# Patient Record
Sex: Male | Born: 1960 | Race: White | Hispanic: No | Marital: Married | Smoking: Former smoker
Health system: Southern US, Community
[De-identification: ages and names within clinical notes are randomized; demographics above are authoritative.]

## PROBLEM LIST (undated history)

## (undated) DIAGNOSIS — I10 Essential (primary) hypertension: Secondary | ICD-10-CM

## (undated) DIAGNOSIS — F329 Major depressive disorder, single episode, unspecified: Secondary | ICD-10-CM

## (undated) DIAGNOSIS — K2990 Gastroduodenitis, unspecified, without bleeding: Secondary | ICD-10-CM

## (undated) DIAGNOSIS — Z8719 Personal history of other diseases of the digestive system: Secondary | ICD-10-CM

## (undated) DIAGNOSIS — G4730 Sleep apnea, unspecified: Secondary | ICD-10-CM

## (undated) DIAGNOSIS — T7840XA Allergy, unspecified, initial encounter: Secondary | ICD-10-CM

## (undated) DIAGNOSIS — D869 Sarcoidosis, unspecified: Secondary | ICD-10-CM

## (undated) DIAGNOSIS — K219 Gastro-esophageal reflux disease without esophagitis: Secondary | ICD-10-CM

## (undated) DIAGNOSIS — K589 Irritable bowel syndrome without diarrhea: Secondary | ICD-10-CM

## (undated) DIAGNOSIS — F419 Anxiety disorder, unspecified: Secondary | ICD-10-CM

## (undated) DIAGNOSIS — F3289 Other specified depressive episodes: Secondary | ICD-10-CM

## (undated) DIAGNOSIS — K2970 Gastritis, unspecified, without bleeding: Secondary | ICD-10-CM

## (undated) DIAGNOSIS — B20 Human immunodeficiency virus [HIV] disease: Secondary | ICD-10-CM

## (undated) DIAGNOSIS — G5621 Lesion of ulnar nerve, right upper limb: Secondary | ICD-10-CM

## (undated) HISTORY — DX: Other specified depressive episodes: F32.89

## (undated) HISTORY — DX: Allergy, unspecified, initial encounter: T78.40XA

## (undated) HISTORY — DX: Gastroduodenitis, unspecified, without bleeding: K29.90

## (undated) HISTORY — DX: Human immunodeficiency virus (HIV) disease: B20

## (undated) HISTORY — DX: Irritable bowel syndrome, unspecified: K58.9

## (undated) HISTORY — PX: HERNIA REPAIR: SHX51

## (undated) HISTORY — DX: Sarcoidosis, unspecified: D86.9

## (undated) HISTORY — DX: Gastritis, unspecified, without bleeding: K29.70

## (undated) HISTORY — DX: Major depressive disorder, single episode, unspecified: F32.9

## (undated) HISTORY — DX: Anxiety disorder, unspecified: F41.9

## (undated) HISTORY — PX: TONSILLECTOMY AND ADENOIDECTOMY: SUR1326

---

## 1998-10-31 DIAGNOSIS — D869 Sarcoidosis, unspecified: Secondary | ICD-10-CM

## 1998-10-31 HISTORY — DX: Sarcoidosis, unspecified: D86.9

## 1998-10-31 HISTORY — PX: LUNG SURGERY: SHX703

## 1999-03-29 ENCOUNTER — Encounter: Payer: Self-pay | Admitting: Specialist

## 1999-03-31 ENCOUNTER — Ambulatory Visit (HOSPITAL_COMMUNITY): Admission: RE | Admit: 1999-03-31 | Discharge: 1999-03-31 | Payer: Self-pay | Admitting: Specialist

## 1999-03-31 ENCOUNTER — Encounter: Payer: Self-pay | Admitting: Specialist

## 1999-04-02 ENCOUNTER — Encounter: Payer: Self-pay | Admitting: Oncology

## 1999-04-02 ENCOUNTER — Ambulatory Visit (HOSPITAL_COMMUNITY): Admission: RE | Admit: 1999-04-02 | Discharge: 1999-04-02 | Payer: Self-pay | Admitting: Oncology

## 1999-04-07 ENCOUNTER — Ambulatory Visit (HOSPITAL_COMMUNITY): Admission: RE | Admit: 1999-04-07 | Discharge: 1999-04-07 | Payer: Self-pay | Admitting: Pulmonary Disease

## 1999-04-07 ENCOUNTER — Encounter: Payer: Self-pay | Admitting: Pulmonary Disease

## 1999-04-30 ENCOUNTER — Encounter: Payer: Self-pay | Admitting: Cardiothoracic Surgery

## 1999-05-03 ENCOUNTER — Inpatient Hospital Stay (HOSPITAL_COMMUNITY): Admission: RE | Admit: 1999-05-03 | Discharge: 1999-05-06 | Payer: Self-pay | Admitting: Cardiothoracic Surgery

## 1999-05-03 ENCOUNTER — Encounter: Payer: Self-pay | Admitting: Cardiothoracic Surgery

## 1999-05-04 ENCOUNTER — Encounter: Payer: Self-pay | Admitting: Cardiothoracic Surgery

## 1999-05-05 ENCOUNTER — Encounter: Payer: Self-pay | Admitting: Cardiothoracic Surgery

## 1999-07-07 ENCOUNTER — Ambulatory Visit (HOSPITAL_COMMUNITY): Admission: RE | Admit: 1999-07-07 | Discharge: 1999-07-07 | Payer: Self-pay | Admitting: Pulmonary Disease

## 2000-07-26 ENCOUNTER — Ambulatory Visit (HOSPITAL_COMMUNITY): Admission: RE | Admit: 2000-07-26 | Discharge: 2000-07-26 | Payer: Self-pay | Admitting: Pulmonary Disease

## 2000-09-08 ENCOUNTER — Ambulatory Visit (HOSPITAL_COMMUNITY): Admission: RE | Admit: 2000-09-08 | Discharge: 2000-09-08 | Payer: Self-pay | Admitting: Internal Medicine

## 2000-09-08 ENCOUNTER — Encounter: Payer: Self-pay | Admitting: Internal Medicine

## 2000-10-31 HISTORY — PX: ESOPHAGOGASTRODUODENOSCOPY: SHX1529

## 2001-03-20 ENCOUNTER — Encounter: Payer: Self-pay | Admitting: *Deleted

## 2001-03-20 ENCOUNTER — Inpatient Hospital Stay (HOSPITAL_COMMUNITY): Admission: EM | Admit: 2001-03-20 | Discharge: 2001-03-29 | Payer: Self-pay | Admitting: Emergency Medicine

## 2001-03-26 ENCOUNTER — Encounter: Payer: Self-pay | Admitting: Internal Medicine

## 2001-03-31 ENCOUNTER — Inpatient Hospital Stay (HOSPITAL_COMMUNITY): Admission: EM | Admit: 2001-03-31 | Discharge: 2001-04-04 | Payer: Self-pay | Admitting: Emergency Medicine

## 2001-06-06 ENCOUNTER — Ambulatory Visit (HOSPITAL_COMMUNITY): Admission: RE | Admit: 2001-06-06 | Discharge: 2001-06-06 | Payer: Self-pay | Admitting: Internal Medicine

## 2001-06-06 ENCOUNTER — Encounter: Admission: RE | Admit: 2001-06-06 | Discharge: 2001-06-06 | Payer: Self-pay | Admitting: Internal Medicine

## 2001-06-06 ENCOUNTER — Encounter (INDEPENDENT_AMBULATORY_CARE_PROVIDER_SITE_OTHER): Payer: Self-pay | Admitting: *Deleted

## 2001-06-06 LAB — CONVERTED CEMR LAB: CD4 Count: 340 microliters

## 2001-06-19 ENCOUNTER — Encounter: Admission: RE | Admit: 2001-06-19 | Discharge: 2001-06-19 | Payer: Self-pay | Admitting: Internal Medicine

## 2001-07-10 ENCOUNTER — Encounter: Admission: RE | Admit: 2001-07-10 | Discharge: 2001-07-10 | Payer: Self-pay | Admitting: Internal Medicine

## 2001-08-07 ENCOUNTER — Encounter: Admission: RE | Admit: 2001-08-07 | Discharge: 2001-08-07 | Payer: Self-pay | Admitting: Internal Medicine

## 2001-08-07 ENCOUNTER — Ambulatory Visit (HOSPITAL_COMMUNITY): Admission: RE | Admit: 2001-08-07 | Discharge: 2001-08-07 | Payer: Self-pay | Admitting: Internal Medicine

## 2001-08-21 ENCOUNTER — Encounter: Admission: RE | Admit: 2001-08-21 | Discharge: 2001-08-21 | Payer: Self-pay | Admitting: Internal Medicine

## 2001-10-29 ENCOUNTER — Encounter: Admission: RE | Admit: 2001-10-29 | Discharge: 2001-10-29 | Payer: Self-pay | Admitting: Internal Medicine

## 2001-10-29 ENCOUNTER — Ambulatory Visit: Admission: RE | Admit: 2001-10-29 | Discharge: 2001-10-29 | Payer: Self-pay | Admitting: Internal Medicine

## 2001-12-11 ENCOUNTER — Encounter: Admission: RE | Admit: 2001-12-11 | Discharge: 2001-12-11 | Payer: Self-pay | Admitting: Internal Medicine

## 2002-02-26 ENCOUNTER — Ambulatory Visit (HOSPITAL_COMMUNITY): Admission: RE | Admit: 2002-02-26 | Discharge: 2002-02-26 | Payer: Self-pay | Admitting: Internal Medicine

## 2002-02-26 ENCOUNTER — Encounter: Admission: RE | Admit: 2002-02-26 | Discharge: 2002-02-26 | Payer: Self-pay | Admitting: Internal Medicine

## 2002-03-12 ENCOUNTER — Encounter: Admission: RE | Admit: 2002-03-12 | Discharge: 2002-03-12 | Payer: Self-pay | Admitting: Internal Medicine

## 2002-06-04 ENCOUNTER — Ambulatory Visit (HOSPITAL_COMMUNITY): Admission: RE | Admit: 2002-06-04 | Discharge: 2002-06-04 | Payer: Self-pay | Admitting: Internal Medicine

## 2002-06-04 ENCOUNTER — Encounter: Admission: RE | Admit: 2002-06-04 | Discharge: 2002-06-04 | Payer: Self-pay | Admitting: Internal Medicine

## 2002-06-18 ENCOUNTER — Encounter: Admission: RE | Admit: 2002-06-18 | Discharge: 2002-06-18 | Payer: Self-pay | Admitting: Internal Medicine

## 2002-07-23 ENCOUNTER — Encounter: Admission: RE | Admit: 2002-07-23 | Discharge: 2002-07-23 | Payer: Self-pay | Admitting: Internal Medicine

## 2002-09-23 ENCOUNTER — Ambulatory Visit (HOSPITAL_COMMUNITY): Admission: RE | Admit: 2002-09-23 | Discharge: 2002-09-23 | Payer: Self-pay | Admitting: Internal Medicine

## 2002-09-23 ENCOUNTER — Encounter: Admission: RE | Admit: 2002-09-23 | Discharge: 2002-09-23 | Payer: Self-pay | Admitting: Internal Medicine

## 2002-11-19 ENCOUNTER — Encounter: Admission: RE | Admit: 2002-11-19 | Discharge: 2002-11-19 | Payer: Self-pay | Admitting: Internal Medicine

## 2002-12-10 ENCOUNTER — Encounter: Admission: RE | Admit: 2002-12-10 | Discharge: 2002-12-10 | Payer: Self-pay | Admitting: Internal Medicine

## 2002-12-24 ENCOUNTER — Encounter: Admission: RE | Admit: 2002-12-24 | Discharge: 2002-12-24 | Payer: Self-pay | Admitting: Internal Medicine

## 2003-03-11 ENCOUNTER — Encounter: Admission: RE | Admit: 2003-03-11 | Discharge: 2003-03-11 | Payer: Self-pay | Admitting: Internal Medicine

## 2003-03-11 ENCOUNTER — Encounter: Payer: Self-pay | Admitting: Internal Medicine

## 2003-03-25 ENCOUNTER — Encounter: Admission: RE | Admit: 2003-03-25 | Discharge: 2003-03-25 | Payer: Self-pay | Admitting: Internal Medicine

## 2003-06-10 ENCOUNTER — Ambulatory Visit (HOSPITAL_COMMUNITY): Admission: RE | Admit: 2003-06-10 | Discharge: 2003-06-10 | Payer: Self-pay | Admitting: Internal Medicine

## 2003-06-10 ENCOUNTER — Encounter: Admission: RE | Admit: 2003-06-10 | Discharge: 2003-06-10 | Payer: Self-pay | Admitting: Internal Medicine

## 2003-06-10 ENCOUNTER — Encounter: Payer: Self-pay | Admitting: Internal Medicine

## 2003-06-24 ENCOUNTER — Encounter: Admission: RE | Admit: 2003-06-24 | Discharge: 2003-06-24 | Payer: Self-pay | Admitting: Internal Medicine

## 2003-08-27 ENCOUNTER — Encounter: Payer: Self-pay | Admitting: Internal Medicine

## 2003-08-27 ENCOUNTER — Encounter: Admission: RE | Admit: 2003-08-27 | Discharge: 2003-08-27 | Payer: Self-pay | Admitting: Internal Medicine

## 2003-08-27 ENCOUNTER — Ambulatory Visit (HOSPITAL_COMMUNITY): Admission: RE | Admit: 2003-08-27 | Discharge: 2003-08-27 | Payer: Self-pay | Admitting: Internal Medicine

## 2003-09-11 ENCOUNTER — Encounter: Admission: RE | Admit: 2003-09-11 | Discharge: 2003-09-11 | Payer: Self-pay | Admitting: Internal Medicine

## 2003-12-15 ENCOUNTER — Ambulatory Visit: Admission: RE | Admit: 2003-12-15 | Discharge: 2003-12-15 | Payer: Self-pay | Admitting: Internal Medicine

## 2003-12-15 ENCOUNTER — Encounter: Admission: RE | Admit: 2003-12-15 | Discharge: 2003-12-15 | Payer: Self-pay | Admitting: Internal Medicine

## 2003-12-23 ENCOUNTER — Encounter: Admission: RE | Admit: 2003-12-23 | Discharge: 2003-12-23 | Payer: Self-pay | Admitting: Internal Medicine

## 2004-02-24 ENCOUNTER — Ambulatory Visit (HOSPITAL_COMMUNITY): Admission: RE | Admit: 2004-02-24 | Discharge: 2004-02-24 | Payer: Self-pay | Admitting: Internal Medicine

## 2004-02-24 ENCOUNTER — Encounter: Admission: RE | Admit: 2004-02-24 | Discharge: 2004-02-24 | Payer: Self-pay | Admitting: Internal Medicine

## 2004-03-16 ENCOUNTER — Encounter: Admission: RE | Admit: 2004-03-16 | Discharge: 2004-03-16 | Payer: Self-pay | Admitting: Internal Medicine

## 2004-04-12 ENCOUNTER — Encounter: Admission: RE | Admit: 2004-04-12 | Discharge: 2004-04-12 | Payer: Self-pay | Admitting: Family Medicine

## 2004-05-17 ENCOUNTER — Encounter: Admission: RE | Admit: 2004-05-17 | Discharge: 2004-05-17 | Payer: Self-pay | Admitting: Internal Medicine

## 2004-05-17 ENCOUNTER — Ambulatory Visit (HOSPITAL_COMMUNITY): Admission: RE | Admit: 2004-05-17 | Discharge: 2004-05-17 | Payer: Self-pay | Admitting: Internal Medicine

## 2004-06-08 ENCOUNTER — Encounter: Admission: RE | Admit: 2004-06-08 | Discharge: 2004-06-08 | Payer: Self-pay | Admitting: Internal Medicine

## 2004-10-01 ENCOUNTER — Ambulatory Visit (HOSPITAL_COMMUNITY): Admission: RE | Admit: 2004-10-01 | Discharge: 2004-10-01 | Payer: Self-pay | Admitting: Internal Medicine

## 2004-10-01 ENCOUNTER — Ambulatory Visit: Payer: Self-pay | Admitting: Internal Medicine

## 2004-10-12 ENCOUNTER — Ambulatory Visit: Payer: Self-pay | Admitting: Internal Medicine

## 2004-12-22 ENCOUNTER — Ambulatory Visit: Payer: Self-pay | Admitting: Internal Medicine

## 2004-12-22 ENCOUNTER — Encounter (INDEPENDENT_AMBULATORY_CARE_PROVIDER_SITE_OTHER): Payer: Self-pay | Admitting: *Deleted

## 2004-12-22 ENCOUNTER — Ambulatory Visit (HOSPITAL_COMMUNITY): Admission: RE | Admit: 2004-12-22 | Discharge: 2004-12-22 | Payer: Self-pay | Admitting: Internal Medicine

## 2004-12-22 LAB — CONVERTED CEMR LAB: CD4 Count: 260 microliters

## 2005-01-04 ENCOUNTER — Ambulatory Visit: Payer: Self-pay | Admitting: Internal Medicine

## 2006-01-24 ENCOUNTER — Encounter: Admission: RE | Admit: 2006-01-24 | Discharge: 2006-01-24 | Payer: Self-pay | Admitting: Internal Medicine

## 2006-01-24 ENCOUNTER — Ambulatory Visit: Payer: Self-pay | Admitting: Internal Medicine

## 2006-01-24 ENCOUNTER — Encounter (INDEPENDENT_AMBULATORY_CARE_PROVIDER_SITE_OTHER): Payer: Self-pay | Admitting: *Deleted

## 2006-02-07 ENCOUNTER — Ambulatory Visit: Payer: Self-pay | Admitting: Internal Medicine

## 2006-07-25 ENCOUNTER — Ambulatory Visit: Payer: Self-pay | Admitting: Internal Medicine

## 2006-07-25 ENCOUNTER — Encounter (INDEPENDENT_AMBULATORY_CARE_PROVIDER_SITE_OTHER): Payer: Self-pay | Admitting: *Deleted

## 2006-07-25 ENCOUNTER — Encounter: Admission: RE | Admit: 2006-07-25 | Discharge: 2006-07-25 | Payer: Self-pay | Admitting: Internal Medicine

## 2006-07-25 LAB — CONVERTED CEMR LAB
CD4 Count: 410 microliters
HIV 1 RNA Quant: 639 copies/mL

## 2006-08-08 ENCOUNTER — Ambulatory Visit: Payer: Self-pay | Admitting: Internal Medicine

## 2006-08-18 DIAGNOSIS — B20 Human immunodeficiency virus [HIV] disease: Secondary | ICD-10-CM

## 2006-08-18 DIAGNOSIS — D869 Sarcoidosis, unspecified: Secondary | ICD-10-CM | POA: Insufficient documentation

## 2006-12-21 ENCOUNTER — Encounter: Admission: RE | Admit: 2006-12-21 | Discharge: 2006-12-21 | Payer: Self-pay | Admitting: Internal Medicine

## 2006-12-21 ENCOUNTER — Ambulatory Visit: Payer: Self-pay | Admitting: Internal Medicine

## 2006-12-21 LAB — CONVERTED CEMR LAB
ALT: 25 units/L (ref 0–53)
AST: 19 units/L (ref 0–37)
Albumin: 4.8 g/dL (ref 3.5–5.2)
Alkaline Phosphatase: 83 units/L (ref 39–117)
BUN: 15 mg/dL (ref 6–23)
Basophils Absolute: 0 10*3/uL (ref 0.0–0.1)
Basophils Relative: 0 % (ref 0–1)
CD4 % Helper T Cell: 27 %
CO2: 27 meq/L (ref 19–32)
Calcium: 9.6 mg/dL (ref 8.4–10.5)
Chloride: 104 meq/L (ref 96–112)
Cholesterol: 123 mg/dL (ref 0–200)
Creatinine, Ser: 1.01 mg/dL (ref 0.40–1.50)
Eosinophils Absolute: 0.1 10*3/uL (ref 0.0–0.7)
Eosinophils Relative: 1 % (ref 0–5)
Glucose, Bld: 80 mg/dL (ref 70–99)
HCT: 49.4 % (ref 39.0–52.0)
HDL: 27 mg/dL — ABNORMAL LOW (ref >39)
HIV 1 RNA Quant: 497 copies/mL — ABNORMAL HIGH (ref <50)
HIV-1 RNA Quant, Log: 2.7 — ABNORMAL HIGH (ref <1.70)
Hemoglobin: 17.3 g/dL — ABNORMAL HIGH (ref 13.0–17.0)
LDL Cholesterol: 47 mg/dL (ref 0–99)
Lymphocytes Relative: 24 % (ref 12–46)
Lymphs Abs: 1.5 10*3/uL (ref 0.7–3.3)
MCHC: 35 g/dL (ref 30.0–36.0)
MCV: 92.7 fL (ref 78.0–100.0)
Monocytes Absolute: 0.5 10*3/uL (ref 0.2–0.7)
Monocytes Relative: 7 % (ref 3–11)
Neutro Abs: 4.2 10*3/uL (ref 1.7–7.7)
Neutrophils Relative %: 68 % (ref 43–77)
Platelets: 213 10*3/uL (ref 150–400)
Potassium: 4.9 meq/L (ref 3.5–5.3)
RBC: 5.33 M/uL (ref 4.22–5.81)
RDW: 13.6 % (ref 11.5–14.0)
Sodium: 138 meq/L (ref 135–145)
Total Bilirubin: 0.5 mg/dL (ref 0.3–1.2)
Total CHOL/HDL Ratio: 4.6
Total Protein: 7.7 g/dL (ref 6.0–8.3)
Triglycerides: 244 mg/dL — ABNORMAL HIGH (ref <150)
VLDL: 49 mg/dL — ABNORMAL HIGH (ref 0–40)
WBC: 6.3 10*3/uL (ref 4.0–10.5)

## 2006-12-25 ENCOUNTER — Encounter (INDEPENDENT_AMBULATORY_CARE_PROVIDER_SITE_OTHER): Payer: Self-pay | Admitting: *Deleted

## 2006-12-25 LAB — CONVERTED CEMR LAB

## 2007-01-03 DIAGNOSIS — F329 Major depressive disorder, single episode, unspecified: Secondary | ICD-10-CM

## 2007-01-03 DIAGNOSIS — F32A Depression, unspecified: Secondary | ICD-10-CM | POA: Insufficient documentation

## 2007-01-04 ENCOUNTER — Ambulatory Visit: Payer: Self-pay | Admitting: Internal Medicine

## 2007-01-07 ENCOUNTER — Encounter (INDEPENDENT_AMBULATORY_CARE_PROVIDER_SITE_OTHER): Payer: Self-pay | Admitting: *Deleted

## 2007-02-19 ENCOUNTER — Ambulatory Visit: Payer: Self-pay | Admitting: Internal Medicine

## 2007-02-19 LAB — CONVERTED CEMR LAB
Bilirubin Urine: NEGATIVE
Ketones, ur: NEGATIVE mg/dL
Protein, ur: NEGATIVE mg/dL
RBC / HPF: NONE SEEN (ref <3)
Urine Glucose: NEGATIVE mg/dL
WBC, UA: NONE SEEN cells/hpf (ref <3)

## 2007-03-16 ENCOUNTER — Encounter: Payer: Self-pay | Admitting: Internal Medicine

## 2007-03-16 ENCOUNTER — Ambulatory Visit: Payer: Self-pay | Admitting: Internal Medicine

## 2007-03-16 LAB — CONVERTED CEMR LAB
AST: 24 units/L (ref 0–37)
Bilirubin, Direct: 0.1 mg/dL (ref 0.0–0.3)
CO2: 28 meq/L (ref 19–32)
Chloride: 105 meq/L (ref 96–112)
Creatinine, Ser: 1 mg/dL (ref 0.4–1.5)
Eosinophils Relative: 1.6 % (ref 0.0–5.0)
Glucose, Bld: 100 mg/dL — ABNORMAL HIGH (ref 70–99)
HCT: 47.7 % (ref 39.0–52.0)
Neutrophils Relative %: 69.4 % (ref 43.0–77.0)
RBC: 5.12 M/uL (ref 4.22–5.81)
RDW: 12.5 % (ref 11.5–14.6)
Sodium: 139 meq/L (ref 135–145)
TSH: 0.69 microintl units/mL (ref 0.35–5.50)
Total Bilirubin: 0.7 mg/dL (ref 0.3–1.2)
Total Protein: 7.6 g/dL (ref 6.0–8.3)
WBC: 6.6 10*3/uL (ref 4.5–10.5)

## 2007-07-18 ENCOUNTER — Ambulatory Visit: Payer: Self-pay | Admitting: Internal Medicine

## 2007-07-18 ENCOUNTER — Encounter: Admission: RE | Admit: 2007-07-18 | Discharge: 2007-07-18 | Payer: Self-pay | Admitting: Internal Medicine

## 2007-08-07 ENCOUNTER — Ambulatory Visit: Payer: Self-pay | Admitting: Internal Medicine

## 2007-10-30 ENCOUNTER — Encounter: Payer: Self-pay | Admitting: Internal Medicine

## 2008-01-30 ENCOUNTER — Encounter: Admission: RE | Admit: 2008-01-30 | Discharge: 2008-01-30 | Payer: Self-pay | Admitting: Internal Medicine

## 2008-01-30 ENCOUNTER — Ambulatory Visit: Payer: Self-pay | Admitting: Internal Medicine

## 2008-01-30 ENCOUNTER — Encounter: Payer: Self-pay | Admitting: Internal Medicine

## 2008-01-30 LAB — CONVERTED CEMR LAB
AST: 19 units/L (ref 0–37)
Alkaline Phosphatase: 82 units/L (ref 39–117)
BUN: 16 mg/dL (ref 6–23)
Creatinine, Ser: 0.93 mg/dL (ref 0.40–1.50)
HCT: 47.2 % (ref 39.0–52.0)
HDL: 29 mg/dL — ABNORMAL LOW (ref >39)
Hemoglobin: 16.8 g/dL (ref 13.0–17.0)
LDL Cholesterol: 69 mg/dL (ref 0–99)
MCHC: 35.6 g/dL (ref 30.0–36.0)
MCV: 91.5 fL (ref 78.0–100.0)
RDW: 13.8 % (ref 11.5–15.5)
Total CHOL/HDL Ratio: 4.6
Triglycerides: 181 mg/dL — ABNORMAL HIGH (ref <150)

## 2008-02-12 ENCOUNTER — Ambulatory Visit: Payer: Self-pay | Admitting: Internal Medicine

## 2008-07-23 ENCOUNTER — Ambulatory Visit: Payer: Self-pay | Admitting: Internal Medicine

## 2008-07-23 LAB — CONVERTED CEMR LAB: HIV-1 RNA Quant, Log: 2.87 — ABNORMAL HIGH (ref <1.70)

## 2008-08-19 ENCOUNTER — Ambulatory Visit: Payer: Self-pay | Admitting: Internal Medicine

## 2008-08-19 LAB — CONVERTED CEMR LAB: HIV-1 RNA Quant, Log: 2.49 — ABNORMAL HIGH (ref <1.70)

## 2008-08-22 ENCOUNTER — Telehealth: Payer: Self-pay | Admitting: Internal Medicine

## 2008-10-31 HISTORY — PX: COLONOSCOPY: SHX5424

## 2009-01-07 ENCOUNTER — Encounter: Payer: Self-pay | Admitting: Internal Medicine

## 2009-02-09 ENCOUNTER — Ambulatory Visit: Payer: Self-pay | Admitting: Internal Medicine

## 2009-02-10 ENCOUNTER — Ambulatory Visit: Payer: Self-pay | Admitting: Internal Medicine

## 2009-02-10 LAB — CONVERTED CEMR LAB
ALT: 16 units/L (ref 0–53)
Albumin: 4.5 g/dL (ref 3.5–5.2)
Alkaline Phosphatase: 85 units/L (ref 39–117)
Basophils Absolute: 0 10*3/uL (ref 0.0–0.1)
Basophils Relative: 0 % (ref 0–1)
CO2: 26 meq/L (ref 19–32)
Eosinophils Absolute: 0.1 10*3/uL (ref 0.0–0.7)
Eosinophils Relative: 2 % (ref 0–5)
GFR calc non Af Amer: >60 mL/min (ref >60)
Glucose, Bld: 99 mg/dL (ref 70–99)
HCT: 46.1 % (ref 39.0–52.0)
HIV 1 RNA Quant: 989 copies/mL — ABNORMAL HIGH (ref <48)
Hemoglobin: 16.1 g/dL (ref 13.0–17.0)
Lymphocytes Relative: 25 % (ref 12–46)
MCHC: 34.9 g/dL (ref 30.0–36.0)
Monocytes Absolute: 0.5 10*3/uL (ref 0.1–1.0)
PSA: 0.52 ng/mL (ref 0.10–4.00)
Platelets: 200 10*3/uL (ref 150–400)
Potassium: 4.2 meq/L (ref 3.5–5.3)
RDW: 14.1 % (ref 11.5–15.5)
Sodium: 140 meq/L (ref 135–145)
TSH: 3.199 microintl units/mL (ref 0.350–4.500)
Total Protein: 7.2 g/dL (ref 6.0–8.3)
Triglycerides: 590 mg/dL — ABNORMAL HIGH (ref <150)

## 2009-02-16 ENCOUNTER — Telehealth (INDEPENDENT_AMBULATORY_CARE_PROVIDER_SITE_OTHER): Payer: Self-pay | Admitting: *Deleted

## 2009-02-24 ENCOUNTER — Ambulatory Visit: Payer: Self-pay | Admitting: Internal Medicine

## 2009-02-24 LAB — CONVERTED CEMR LAB: HIV 1 RNA Quant: 958 copies/mL — ABNORMAL HIGH (ref <48)

## 2009-03-05 ENCOUNTER — Telehealth: Payer: Self-pay | Admitting: Internal Medicine

## 2009-05-06 ENCOUNTER — Ambulatory Visit: Payer: Self-pay | Admitting: Internal Medicine

## 2009-05-06 LAB — CONVERTED CEMR LAB: HIV-1 RNA Quant, Log: 2.34 — ABNORMAL HIGH (ref <1.68)

## 2009-05-22 ENCOUNTER — Telehealth: Payer: Self-pay | Admitting: Internal Medicine

## 2009-05-26 ENCOUNTER — Ambulatory Visit: Payer: Self-pay | Admitting: Internal Medicine

## 2009-06-05 ENCOUNTER — Encounter: Payer: Self-pay | Admitting: Internal Medicine

## 2009-06-10 ENCOUNTER — Encounter: Payer: Self-pay | Admitting: Internal Medicine

## 2009-08-25 ENCOUNTER — Ambulatory Visit: Payer: Self-pay | Admitting: Internal Medicine

## 2009-08-25 LAB — CONVERTED CEMR LAB
HIV 1 RNA Quant: <48 copies/mL (ref <48)
HIV-1 RNA Quant, Log: <1.68 (ref <1.68)

## 2009-09-08 ENCOUNTER — Ambulatory Visit: Payer: Self-pay | Admitting: Internal Medicine

## 2009-11-23 ENCOUNTER — Ambulatory Visit: Payer: Self-pay | Admitting: Internal Medicine

## 2009-11-23 LAB — CONVERTED CEMR LAB
ALT: 15 units/L (ref 0–53)
Albumin: 5.1 g/dL (ref 3.5–5.2)
Alkaline Phosphatase: 85 units/L (ref 39–117)
Basophils Absolute: 0 10*3/uL (ref 0.0–0.1)
Basophils Relative: 0 % (ref 0–1)
CO2: 25 meq/L (ref 19–32)
HIV 1 RNA Quant: 314 copies/mL — ABNORMAL HIGH (ref <48)
HIV-1 RNA Quant, Log: 2.5 — ABNORMAL HIGH (ref <1.68)
Hemoglobin: 17.2 g/dL — ABNORMAL HIGH (ref 13.0–17.0)
LDL Cholesterol: 94 mg/dL (ref 0–99)
Lymphocytes Relative: 23 % (ref 12–46)
MCHC: 35 g/dL (ref 30.0–36.0)
Monocytes Absolute: 0.5 10*3/uL (ref 0.1–1.0)
Monocytes Relative: 7 % (ref 3–12)
Neutro Abs: 4.8 10*3/uL (ref 1.7–7.7)
Neutrophils Relative %: 70 % (ref 43–77)
Potassium: 4.6 meq/L (ref 3.5–5.3)
RBC: 5.32 M/uL (ref 4.22–5.81)
Sodium: 140 meq/L (ref 135–145)
Total Bilirubin: 0.5 mg/dL (ref 0.3–1.2)
Total Protein: 7.7 g/dL (ref 6.0–8.3)
VLDL: 31 mg/dL (ref 0–40)
WBC: 6.8 10*3/uL (ref 4.0–10.5)

## 2009-12-01 ENCOUNTER — Encounter: Payer: Self-pay | Admitting: Internal Medicine

## 2009-12-09 ENCOUNTER — Telehealth: Payer: Self-pay | Admitting: Internal Medicine

## 2010-02-05 ENCOUNTER — Telehealth: Payer: Self-pay | Admitting: Internal Medicine

## 2010-03-22 ENCOUNTER — Ambulatory Visit: Payer: Self-pay | Admitting: Internal Medicine

## 2010-03-30 ENCOUNTER — Ambulatory Visit: Payer: Self-pay | Admitting: Internal Medicine

## 2010-03-30 LAB — CONVERTED CEMR LAB: HIV-1 RNA Quant, Log: 2.18 — ABNORMAL HIGH (ref <1.68)

## 2010-04-13 ENCOUNTER — Ambulatory Visit: Payer: Self-pay | Admitting: Internal Medicine

## 2010-04-26 ENCOUNTER — Emergency Department (HOSPITAL_BASED_OUTPATIENT_CLINIC_OR_DEPARTMENT_OTHER): Admission: EM | Admit: 2010-04-26 | Discharge: 2010-04-27 | Payer: Self-pay | Admitting: Emergency Medicine

## 2010-04-30 ENCOUNTER — Telehealth: Payer: Self-pay | Admitting: Internal Medicine

## 2010-04-30 ENCOUNTER — Encounter: Payer: Self-pay | Admitting: Internal Medicine

## 2010-05-03 ENCOUNTER — Ambulatory Visit: Payer: Self-pay | Admitting: Family Medicine

## 2010-05-10 ENCOUNTER — Telehealth: Payer: Self-pay | Admitting: Family Medicine

## 2010-05-10 ENCOUNTER — Telehealth: Payer: Self-pay | Admitting: Internal Medicine

## 2010-08-18 ENCOUNTER — Emergency Department (HOSPITAL_COMMUNITY): Admission: EM | Admit: 2010-08-18 | Discharge: 2010-08-18 | Payer: Self-pay | Admitting: Emergency Medicine

## 2010-08-26 ENCOUNTER — Encounter: Payer: Self-pay | Admitting: Internal Medicine

## 2010-08-26 ENCOUNTER — Ambulatory Visit: Payer: Self-pay | Admitting: Internal Medicine

## 2010-08-26 DIAGNOSIS — R079 Chest pain, unspecified: Secondary | ICD-10-CM

## 2010-09-20 ENCOUNTER — Ambulatory Visit: Payer: Self-pay | Admitting: Internal Medicine

## 2010-09-20 LAB — CONVERTED CEMR LAB: HIV 1 RNA Quant: 156 copies/mL — ABNORMAL HIGH (ref <20)

## 2010-10-14 ENCOUNTER — Ambulatory Visit: Payer: Self-pay | Admitting: Internal Medicine

## 2010-12-02 NOTE — Assessment & Plan Note (Signed)
Summary: labs    Current Allergies: ! MORPHINE ! DOXYCYCLINE  Other Orders: T-CD4SP ([facility]) (CD4SP) T-HIV Viral Load [phone]) T-PSA [phone])  Process Orders Check Orders Results:     [facility]: ABN not required for this insurance Tests Sent for requisitioning (April 05, 2010 4:43 PM):     03/30/2010: [facility] -- T-HIV Viral Load [phone] (signed)     03/30/2010: [facility] -- T-PSA [phone] (signed)

## 2010-12-02 NOTE — Progress Notes (Signed)
Summary: ED visit  Phone Note Call from Patient   Caller: Patient Call For: Cliffton Asters MD Reason for Call: Acute Illness Details for Reason: ED visit for shingles Summary of Call: Pt. called to notify Dr. Orvan Falconer that he was seen in the ED 04/23/10 for outbreak of shingles and given RX for Acyclovir 800 mg. one tablet four times a day.  He is not having any pain, but wanted Dr. Orvan Falconer to be aware.  Will call clinic if he needs to be seen. Initial call taken by: Wendall Mola CMA ( AAMA),  April 30, 2010 11:00 AM

## 2010-12-02 NOTE — Progress Notes (Signed)
Summary: Re: results   Phone Note Call from Patient   Summary of Call: Pt says he was told to call prior to his visit for CD-4 and VL and if they were ok he would not need a visit with Dr Orvan Falconer. Please advise.   Pt will be available on 12-10-09 for return call. Initial call taken by: Tomasita Morrow RN,  December 09, 2009 2:05 PM  Follow-up for Phone Call        Tyreese's CD4 count is stable at 410 and his VL is 314. He can f/u after labs in 3 months. Follow-up by: Cliffton Asters MD,  December 10, 2009 9:54 AM

## 2010-12-02 NOTE — Miscellaneous (Signed)
  Clinical Lists Changes  Medications: Added new medication of ACYCLOVIR 800 MG TABS (ACYCLOVIR) take one tablet by mouth every four hours

## 2010-12-02 NOTE — Assessment & Plan Note (Signed)
Summary: HAS SHINGLES,NOT GETTING BETTER/TJ   Vital Signs:  Patient Profile:   50 Years Old Male CC:      Shingles- worsening rash and pain, rm 3 Height:     68 inches (172.72 cm) Weight:      174 pounds O2 Sat:      97 % O2 treatment:    Room Air Temp:     97.4 degrees F oral Pulse rate:   71 / minute Resp:     14 per minute BP sitting:   137 / 83  (right arm) Cuff size:   regular  Pt. in pain?   yes    Location:   left side and back    Type:       aching  Vitals Entered By: Lajean Saver RN (May 03, 2010 12:35 PM)                   Updated Prior Medication List: ATRIPLA 600-200-300 MG TABS (EFAVIRENZ-EMTRICITAB-TENOFOVIR) Take 1 tablet by mouth once a day CITALOPRAM HYDROBROMIDE 20 MG  TABS (CITALOPRAM HYDROBROMIDE) Take 1 tablet by mouth once a day ACYCLOVIR 800 MG TABS (ACYCLOVIR) take one tablet by mouth every four hours  Current Allergies (reviewed today): ! MORPHINE ! DOXYCYCLINEHistory of Present Illness Chief Complaint: Shingles- worsening rash and pain, rm 3 History of Present Illness:    Subjective:  Patient complains of onset of left chest shingles one week ago and was started on Acyclovir at that time.  He has had no improvement in pain and the rash has persisted.  REVIEW OF SYSTEMS Constitutional Symptoms       Complains of fatigue.     Denies fever, chills, night sweats, weight loss, and weight gain.  Eyes       Denies change in vision, eye pain, eye discharge, glasses, contact lenses, and eye surgery. Ear/Nose/Throat/Mouth       Denies hearing loss/aids, change in hearing, ear pain, ear discharge, dizziness, frequent runny nose, frequent nose bleeds, sinus problems, sore throat, hoarseness, and tooth pain or bleeding.  Respiratory       Denies dry cough, productive cough, wheezing, shortness of breath, asthma, bronchitis, and emphysema/COPD.  Cardiovascular       Denies murmurs, chest pain, and tires easily with exhertion.    Gastrointestinal     Denies stomach pain, nausea/vomiting, diarrhea, constipation, blood in bowel movements, and indigestion. Genitourniary       Denies painful urination, kidney stones, and loss of urinary control. Neurological       Denies paralysis, seizures, and fainting/blackouts. Musculoskeletal       Denies muscle pain, joint pain, joint stiffness, decreased range of motion, redness, swelling, muscle weakness, and gout.  Skin       Denies bruising, unusual mles/lumps or sores, and hair/skin or nail changes.      Comments: left abdomen spread to back Psych       Denies mood changes, temper/anger issues, anxiety/stress, speech problems, depression, and sleep problems. Other Comments: patient diagnosed with shingle 1 week ago, worsening pain and rash increasing in size   Past History:  Past Medical History: Reviewed history from 02/09/2009 and no changes required. HIV disease (Dr Orvan Falconer) Depression sarcoidosis   Past Surgical History: Tonsillectomy VATS July 2000 ---> Dx  Sarcoidosis (used to see Dr Shelle Iron )  Family History: Reviewed history from 02/09/2009 and no changes required. DM--no HTN--no colon ca--no Prostate ca--no MI--no  Social History: Reviewed history from 03/22/2010 and no changes  required. 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  No adenopathy is present.   Lungs:  Clear to auscultation.  Breath sounds are equal.  Heart:  Regular rate and rhythm without murmurs, rubs, or gallops.  Abdomen:  Nontender without masses or hepatosplenomegaly.  Bowel sounds are present.  No CVA or flank tenderness.  Skin: erythematous raised herpetic eruption on left chest dermatome distribution.  No vesicles.  No tenderness to palpationl Assessment New Problems: HERPES ZOSTER (ICD-053.9)  NOT IMPROVING ON ACYCLOVIR  Plan New  Medications/Changes: VALTREX 1 GM TABS (VALACYCLOVIR HCL) One by mouth q8hr for shingles  #21 x 0, 05/03/2010, Donna Christen MD  New Orders: New Patient Level III [phone] Planning Comments:   Discontinue acyclovir and begin Valtrex.   Follow-up with PCP if not improving.   The patient and/or caregiver has been counseled thoroughly with regard to medications prescribed including dosage, schedule, interactions, rationale for use, and possible side effects and they verbalize understanding.  Diagnoses and expected course of recovery discussed and will return if not improved as expected or if the condition worsens. Patient and/or caregiver verbalized understanding.  Prescriptions: VALTREX 1 GM TABS (VALACYCLOVIR HCL) One by mouth q8hr for shingles  #21 x 0   Entered and Authorized by:   Donna Christen MD   Signed by:   Donna Christen MD on 05/03/2010   Method used:   Print then Give to Patient   RxID:   [id number]   Orders Added: 1)  New Patient Level III [95621]

## 2010-12-02 NOTE — Assessment & Plan Note (Signed)
Summary: 6 MONTH F/U [[facility]]   CC:  follow-up visit.  History of Present Illness: Bruce Woods is in for his routine visit.  He has not missed a single dose of his Atripla since his last visit.  Preventive Screening-Counseling & Management  Alcohol-Tobacco     Alcohol drinks/day: 0     Alcohol type: cocktail, 2/wk     Smoking Status: quit     Year Quit: 1985     Pack years: 2     Passive Smoke Exposure: no  Caffeine-Diet-Exercise     Caffeine use/day: no     Does Patient Exercise: yes     Type of exercise: work out a gym     Exercise (avg: min/session): 60     Times/week: 5  Hep-HIV-STD-Contraception     HIV Risk: no risk noted     HIV Risk Counseling: not indicated-no HIV risk noted  Safety-Violence-Falls     Seat Belt Use: yes  Comments: declined condoms      Sexual History:  currently monogamous.        Drug Use:  never.     Updated Prior Medication List: ATRIPLA 600-200-300 MG TABS (EFAVIRENZ-EMTRICITAB-TENOFOVIR) Take 1 tablet by mouth once a day CITALOPRAM HYDROBROMIDE 20 MG  TABS (CITALOPRAM HYDROBROMIDE) Take 1 tablet by mouth once a day  Current Allergies (reviewed today): ! MORPHINE ! DOXYCYCLINE Vital Signs:  Patient profile:   50 year old male Height:      68 inches (172.72 cm) Weight:      179 pounds (81.36 kg) BMI:     27.32 Temp:     97.6 degrees F (36.44 degrees C) oral Pulse rate:   77 / minute BP sitting:   140 / 90  (left arm) Cuff size:   large  Vitals Entered By: Jennet Maduro RN (October 14, 2010 9:26 AM) CC: follow-up visit Is Patient Diabetic? No Pain Assessment Patient in pain? no      Nutritional Status BMI of 25 - 29 = overweight Nutritional Status Detail appetite "good"  Have you ever been in a relationship where you felt threatened, hurt or afraid?No   Does patient need assistance? Functional Status Self care Ambulation Normal Comments no missed doses   Physical Exam  General:  alert and well-nourished.   Mouth:   pharynx pink and moist.  good dentition.   Lungs:  normal breath sounds, no crackles, and no wheezes.   Heart:  normal rate, regular rhythm, and no murmur.   Psych:  normally interactive, good eye contact, not anxious appearing, and not depressed appearing.          Medication Adherence: 10/14/2010   Adherence to medications reviewed with patient. Counseling to provide adequate adherence provided                                Impression & Recommendations:  Problem # 1:  HIV DISEASE (ICD-042) His viral load is barely detectable but low in stable.  I will continue his current regimen. Diagnostics Reviewed:  HIV: HIV positive - not AIDS (04/13/2010)   CD4: 390 (09/21/2010)   CD4 %: 27 (12/21/2006) WBC: 6.8 (11/23/2009)   Hgb: 17.2 (11/23/2009)   HCT: 49.1 (11/23/2009)   Platelets: 226 (11/23/2009) HIV genotype: See Comment (02/24/2009)   HIV-1 RNA: 156 (09/20/2010)   HBSAg: No (12/25/2006)  Other Orders: Est. Patient Level III (60454) Future Orders: T-CD4SP ([facility]) (CD4SP) ... 04/12/2011 T-HIV Viral  Load [phone]) ... 04/12/2011 T-Comprehensive Metabolic Panel [phone]) ... 04/12/2011 T-CBC w/Diff ([id number]) ... 04/12/2011 T-RPR (Syphilis) [phone]) ... 04/12/2011 T-Lipid Profile [phone]) ... 04/12/2011  Patient Instructions: 1)  Please schedule a follow-up appointment in 6 months.   Prevention & Chronic Care Immunizations   Influenza vaccine: Historical  (08/23/2010)    Tetanus booster: 03/16/2007: Tdap    Pneumococcal vaccine: Pneumovax  (08/19/2008)  Colorectal Screening   Hemoccult: Not documented    Colonoscopy: normal  (06/05/2009)   Colonoscopy due: 06/2019  Other Screening   PSA: 0.48  (03/30/2010)   Smoking status: quit  (10/14/2010)  Lipids   Total Cholesterol: 158  (11/23/2009)   LDL: 94  (11/23/2009)   LDL Direct: Not documented   HDL: 33  (11/23/2009)   Triglycerides: 156  (11/23/2009)    Influenza Immunization  History:    Influenza # 1:  Historical (08/23/2010)

## 2010-12-02 NOTE — Assessment & Plan Note (Signed)
Summary: cpx//lch   Vital Signs:  Patient profile:   50 year old male Height:      68 inches Weight:      172.8 pounds BMI:     26.37 Pulse rate:   70 / minute BP sitting:   150 / 80  Vitals Entered By: Shary Decamp (Mar 22, 2010 3:06 PM) CC: cpx   History of Present Illness: CPX feels well   Preventive Screening-Counseling & Management  Alcohol-Tobacco     Alcohol type: cocktail, 2/wk      Drug Use:  no.    Current Medications (verified): 1)  Atripla 600-200-300 Mg Tabs (Efavirenz-Emtricitab-Tenofovir) .... Take 1 Tablet By Mouth Once A Day 2)  Citalopram Hydrobromide 20 Mg  Tabs (Citalopram Hydrobromide) .... Take 1 Tablet By Mouth Once A Day  Allergies (verified): 1)  ! Morphine 2)  ! Doxycycline  Past History:  Past Medical History: Reviewed history from 02/09/2009 and no changes required. HIV disease (Dr Orvan Falconer) Depression sarcoidosis   Past Surgical History: Reviewed history from 02/09/2009 and no changes required. Tonsillectomy VATS July 2000 ---> Dx  Sarcoidosis (used to see Dr Shelle Iron )  Family History: Reviewed history from 02/09/2009 and no changes required. 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 CV:  Denies chest pain or discomfort and swelling of feet. Resp:  few days ago had episode of sinus congestion cough and wheezing, old symptoms are now gone. GI:  Denies bloody stools, nausea, and vomiting. GU:  Denies hematuria, urinary frequency, and urinary hesitancy. Psych:  Denies anxiety and depression; symptoms well controlled w / citaloram .  Physical Exam  General:  alert, well-developed, and well-nourished.   Neck:  no thyromegaly.   Lungs:  normal respiratory effort, no intercostal  retractions, no accessory muscle use, and normal breath sounds.   Heart:  normal rate, regular rhythm, no murmur, and no gallop.   Abdomen:  soft, non-tender, no distention, no masses, no guarding, no hepatomegaly, and no splenomegaly.   Rectal:   one small external hemorrhoid   noted. Normal sphincter tone. No rectal masses or tenderness. Prostate:  Prostate gland firm and smooth, no enlargement, nodularity, tenderness, mass, asymmetry or induration. Extremities:  no lower extremity edema Psych:  Cognition and judgment appear intact. Alert and cooperative with normal attention span and concentration. No apparent delusions, illusions, hallucinationsnot anxious appearing and not depressed appearing.     Impression & Recommendations:  Problem # 1:  HEALTH SCREENING (ICD-V70.0) Td 2008 Pneumonia shot 2009 Colonoscopy -05-2009, next in 10  years cont healthy lifestyle HIV per Dr Orvan Falconer  he gets his labs routinely checked with Dr. Orvan Falconer, from my standpoint I only need to order a PSA. See instructions  Problem # 2:  DEPRESSION (ICD-311) well-controlled with citalopram His updated medication list for this problem includes:    Citalopram Hydrobromide 20 Mg Tabs (Citalopram hydrobromide) .Marland Kitchen... Take 1 tablet by mouth once a day  Complete Medication List: 1)  Atripla 600-200-300 Mg Tabs (Efavirenz-emtricitab-tenofovir) .... Take 1 tablet by mouth once a day 2)  Citalopram Hydrobromide 20 Mg Tabs (Citalopram hydrobromide) .... Take 1 tablet by mouth once a day  Patient Instructions: 1)  please ask Dr. Orvan Falconer to order a PSA, DX V. 70 and  send results to me 2)  Please schedule a follow-up appointment in 1 year.    Preventive Care Screening  Prior Values:    PPD:  Negative (12/25/2006)    PSA:  0.52 (02/10/2009)    Colonoscopy:  normal (06/05/2009)    Last Tetanus Booster:  Tdap (03/16/2007)    Risk Factors:  Tobacco use:  quit    Year quit:  1985    Pack-years:  2 Passive  smoke exposure:  no Drug use:  no HIV high-risk behavior:  no risk noted Caffeine use:  no drinks per day Alcohol use:  yes    Type:  cocktail, 2/wk    Drinks per day:  0 Exercise:  yes    Times per week:  5    Type:  work out a Arts development officer use:  yes %  Colonoscopy History:    Date of Last Colonoscopy:  06/05/2009

## 2010-12-02 NOTE — Assessment & Plan Note (Signed)
Summary: 6 mos f/u   CC:  f/u.  History of Present Illness: Bruce Woods is in for his routine visit.  He has not missed a single dose of his Atripla since his last visit. He does not feel comfortable stopping his antidepressant because he thinks that he will probably become very irritable again.  His youngest daughter just graduated from high school.  She is planning on going to nursing school.  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  Preventive Screening-Counseling & Management  Alcohol-Tobacco     Alcohol drinks/day: 0     Alcohol type: cocktail, 2/wk     Smoking Status: quit     Year Quit: 1985     Pack years: 2     Passive Smoke Exposure: no  Caffeine-Diet-Exercise     Caffeine use/day: no     Does Patient Exercise: yes     Type of exercise: work out a gym     Exercise (avg: min/session): 60     Times/week: 5  Hep-HIV-STD-Contraception     HIV Risk: no risk noted  Safety-Violence-Falls     Seat Belt Use: yes      Sexual History:  currently monogamous.        Drug Use:  never.     Prior Medication List:  ATRIPLA 600-200-300 MG TABS (EFAVIRENZ-EMTRICITAB-TENOFOVIR) Take 1 tablet by mouth once a day CITALOPRAM HYDROBROMIDE 20 MG  TABS (CITALOPRAM HYDROBROMIDE) Take 1 tablet by mouth once a day   Current Allergies (reviewed today): ! MORPHINE ! DOXYCYCLINE Social History: Drug Use:  never  Vital Signs:  Patient profile:   50 year old male Height:      68 inches (172.72 cm) Weight:      174 pounds (79.09 kg) BMI:     26.55 Temp:     97.4 degrees F oral Pulse rate:   64 / minute BP sitting:   148 / 98  (left arm) Cuff size:   regular  Vitals Entered By: Jennet Maduro RN (April 13, 2010 9:36 AM) CC: f/u Is Patient Diabetic? No Pain Assessment Patient in pain? no      Nutritional Status BMI of 19 -24 = normal Nutritional Status Detail appetite "  Have you ever been in a relationship where you felt  threatened, hurt or afraid?No   Does patient need assistance? Functional Status Self care Ambulation Normal Comments no missed doses   Physical Exam  General:  alert and well-nourished.  facial seborrhea is slightly worse today. Mouth:  pharynx pink and moist.  good dentition.   Lungs:  normal breath sounds, no crackles, and no wheezes.   Heart:  normal rate, regular rhythm, and no murmur.          Medication Adherence: 04/13/2010   Adherence to medications reviewed with patient. Counseling to provide adequate adherence provided   Prevention For Positives: 04/13/2010   Safe sex practices discussed with patient. Condoms offered.                             Impression & Recommendations:  Problem # 1:  HIV DISEASE (ICD-042) Miley's viral load has been lower this year following a blip last April. I will not make any changes today. Diagnostics Reviewed:  CD4: 460 (03/31/2010)   CD4 %: 27 (12/21/2006) WBC: 6.8 (11/23/2009)   Hgb: 17.2 (11/23/2009)   HCT: 49.1 (11/23/2009)   Platelets: 226 (  11/23/2009) HIV genotype: See Comment (02/24/2009)   HIV-1 RNA: 153 (03/30/2010)   HBSAg: No (12/25/2006)  Other Orders: Est. Patient Level III (46962) Future Orders: T-CD4SP ([facility]) (CD4SP) ... 10/10/2010 T-HIV Viral Load [phone]) ... 10/10/2010  Patient Instructions: 1)  Please schedule a follow-up appointment in 6 months.

## 2010-12-02 NOTE — Assessment & Plan Note (Signed)
Summary: WENT TO ER 10/19--CHEST PAIN--ENZYMES NEG--NEEDS FOLLOWUP///SPH   Vital Signs:  Patient profile:   50 year old male Weight:      170.38 pounds Pulse rate:   64 / minute Pulse rhythm:   regular BP sitting:   126 / 86  (left arm) Cuff size:   regular  Vitals Entered By: Army Fossa CMA (August 26, 2010 10:29 AM) CC: Pt here to f/u from ER was seen at [facility] for chest pain.  Comments Doing fine now [employer]    History of Present Illness: followup from the ER visit 08-18-10 , records reviewed: cardiac enzymes negative x2 CBC Normal BMP showed a potassium of 3.4, creatinine 1.0  chest x-ray without acute changes   ROS Since he left the hospital, he is asymptomatic. He describe what happened a few days ago as follows: Sudden onset of left substernal pressure type of pain without radiation. No associated nausea, vomiting, diaphoresis or shortness of breath There were no GERD symptoms, no cough Symptoms lasted about 30 minutes and went away shortly after he got nitroglycerin in the emergency room.  in general he is active, he walks quite a bit at work and runs some times without chest pain. Denies any pain or swelling in his calves   Current Medications (verified): 1)  Atripla 600-200-300 Mg Tabs (Efavirenz-Emtricitab-Tenofovir) .... Take 1 Tablet By Mouth Once A Day 2)  Citalopram Hydrobromide 20 Mg  Tabs (Citalopram Hydrobromide) .... Take 1 Tablet By Mouth Once A Day  Allergies (verified): 1)  ! Morphine 2)  ! Doxycycline  Past History:  Past Medical History: Reviewed history from 02/09/2009 and no changes required. HIV disease (Dr Orvan Falconer) Depression sarcoidosis   Past Surgical History: Reviewed history from 05/03/2010 and no changes required. Tonsillectomy VATS July 2000 ---> Dx  Sarcoidosis (used to see Dr Shelle Iron )  Family History: Reviewed history from 02/09/2009 and no changes required. DM--no HTN--no colon ca--no Prostate  ca--no MI--no  Social History: Reviewed history from 03/22/2010 and no changes required. 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   Chest Wall:  not tender to palpation in the anterior chest Lungs:  normal respiratory effort, no intercostal retractions, no accessory muscle use, and normal breath sounds.   Heart:  normal rate, regular rhythm, no murmur, and no gallop.   Abdomen:  soft, non-tender, no distention, no masses, no guarding, and no rigidity.   Extremities:  no edema   Impression & Recommendations:  Problem # 1:  CHEST PAIN (ICD-22.50) 50 year old gentleman with no diabetes, hypertension or family history of heart disease. He had a 30 minute episode of left-sided chest pain, symptoms went await quickly  after nitroglycerin. EKG 08-18-10 showed normal sinus rhythm EKG today no different from previous Given his age and the fact that the pain went away after nitroglycerin, I recommended a stress test. Start aspirin 81 mg daily ER if symptoms resurface  Orders: EKG w/ Interpretation (93000) Cardiology Referral (Cardiology)  Complete Medication List: 1)  Atripla 600-200-300 Mg Tabs (Efavirenz-emtricitab-tenofovir) .... Take 1 tablet by mouth once a day 2)  Citalopram Hydrobromide 20 Mg Tabs (Citalopram hydrobromide) .... Take 1 tablet by mouth once a day 3)  Aspirin 81 Mg Tbec (Aspirin) .... One daily  Patient Instructions: 1)  start aspirin 2)  call me  if you have more chest pain or  go to the ER if symptoms severe   Orders Added: 1)  EKG w/ Interpretation [93000] 2)  Cardiology Referral [Cardiology] 3)  Est. Patient Level IV [38756]

## 2010-12-02 NOTE — Medication Information (Signed)
Summary: [facility]: Antiretroviral Therapy  [facility]: Antiretroviral Therapy   Imported By: Florinda Marker 12/01/2009 14:33:56  _____________________________________________________________________  External Attachment:    Type:   Image     Comment:   External Document

## 2010-12-02 NOTE — Progress Notes (Signed)
Summary: Does pt. need to be seen 03/01/2010  Phone Note Call from Patient Call back at [phone]  cell   Caller: Patient Call For: Cliffton Asters MD Reason for Call: Talk to Nurse Summary of Call: Pt. came to clinic for labs, but these were unnecessary due to having previously being drawn on 12/29/2009.  Pt. would like to know whether he needs to come in on 03/02/10 or can he wait for another 3 months based on his previous labs.  Please advise. Jennet Maduro RN  February 05, 2010 11:32 AM   Follow-up for Phone Call        I do not see any labs from 3/1. Follow-up by: Cliffton Asters MD,  February 08, 2010 11:42 AM

## 2010-12-02 NOTE — Progress Notes (Signed)
  Phone Note From Other Clinic   Summary of Call: after hours call from nurse line apparently pt called office about getting px for lyrica for day time post herpetic neuralgia pain and never got a call back- so are calling nurseline at 730 pm   Follow-up for Phone Call        I went ahead and called in lyrica 75 mg two times a day -- warned of sedation  told him to f/u with Dr Drue Novel as well Follow-up by: Judith Part MD,  May 10, 2010 7:27 PM    New/Updated Medications: LYRICA 75 MG CAPS (PREGABALIN) 1 by mouth two times a day Prescriptions: LYRICA 75 MG CAPS (PREGABALIN) 1 by mouth two times a day  #60 x 0   Entered and Authorized by:   Judith Part MD   Signed by:   Judith Part MD on 05/10/2010   Method used:   Historical   RxID:   [id number]

## 2010-12-02 NOTE — Progress Notes (Signed)
Summary: [facility]  Phone Note Call from Patient Call back at Work Phone [phone]   Summary of Call: Patient called [place] and wanted to know if he could get a rx for Lyrica for the pain and discomfort from the shingles dx. He said he is "eating" vicoden and robaxin at night for the pain and he is still trying to work during the day, not taking the vicoden then. If you could please call this rx in.   Pharmacy: [facility] on [address].  Initial call taken by: Harold Barban,  May 10, 2010 11:46 AM  Follow-up for Phone Call        see next phone note Tonda Wiederhold E. Nicola Quesnell MD  May 11, 2010 9:58 AM and

## 2011-01-11 LAB — T-HELPER CELL (CD4) - (RCID CLINIC ONLY)
CD4 % Helper T Cell: 22 % — ABNORMAL LOW (ref 33–55)
CD4 T Cell Abs: 390 uL — ABNORMAL LOW (ref 400–2700)

## 2011-01-12 LAB — POCT CARDIAC MARKERS
CKMB, poc: <1 ng/mL — ABNORMAL LOW (ref 1.0–8.0)
CKMB, poc: <1 ng/mL — ABNORMAL LOW (ref 1.0–8.0)
Myoglobin, poc: 61.6 ng/mL (ref 12–200)

## 2011-01-12 LAB — DIFFERENTIAL
Basophils Absolute: 0 10*3/uL (ref 0.0–0.1)
Basophils Relative: 0 % (ref 0–1)
Lymphocytes Relative: 27 % (ref 12–46)
Monocytes Absolute: 0.6 10*3/uL (ref 0.1–1.0)
Neutro Abs: 6.7 10*3/uL (ref 1.7–7.7)
Neutrophils Relative %: 66 % (ref 43–77)

## 2011-01-12 LAB — CBC
Platelets: 224 10*3/uL (ref 150–400)
RDW: 13.3 % (ref 11.5–15.5)
WBC: 10.1 10*3/uL (ref 4.0–10.5)

## 2011-01-12 LAB — BASIC METABOLIC PANEL
BUN: 14 mg/dL (ref 6–23)
Creatinine, Ser: 1.06 mg/dL (ref 0.4–1.5)
GFR calc non Af Amer: >60 mL/min (ref >60)
Potassium: 3.4 mEq/L — ABNORMAL LOW (ref 3.5–5.1)

## 2011-01-16 LAB — T-HELPER CELL (CD4) - (RCID CLINIC ONLY): CD4 % Helper T Cell: 28 % — ABNORMAL LOW (ref 33–55)

## 2011-01-17 LAB — T-HELPER CELL (CD4) - (RCID CLINIC ONLY)
CD4 % Helper T Cell: 26 % — ABNORMAL LOW (ref 33–55)
CD4 T Cell Abs: 460 uL (ref 400–2700)

## 2011-01-27 ENCOUNTER — Encounter: Payer: Self-pay | Admitting: Internal Medicine

## 2011-01-27 ENCOUNTER — Other Ambulatory Visit: Payer: Self-pay | Admitting: *Deleted

## 2011-01-27 DIAGNOSIS — F329 Major depressive disorder, single episode, unspecified: Secondary | ICD-10-CM

## 2011-01-27 MED ORDER — CITALOPRAM HYDROBROMIDE 20 MG PO TABS: 20.0000 mg | ORAL_TABLET | Freq: Every day | ORAL | Status: DC

## 2011-02-03 LAB — T-HELPER CELL (CD4) - (RCID CLINIC ONLY): CD4 % Helper T Cell: 27 % — ABNORMAL LOW (ref 33–55)

## 2011-02-09 LAB — T-HELPER CELL (CD4) - (RCID CLINIC ONLY): CD4 % Helper T Cell: 29 % — ABNORMAL LOW (ref 33–55)

## 2011-03-18 NOTE — H&P (Signed)
[place]. [facility]  Patient:    Bruce Woods, Bruce Woods                     MRN: [id number] Adm. Date:  [id number] Attending:  Rogelia Boga                         History and Physical  CHIEF COMPLAINT:  Weakness, nausea and vomiting.  HISTORY OF PRESENT ILLNESS:  The patient is a 50 year old white gentleman who was admitted to this facility from May 21 through May 30, for an acute febrile illness associated with severe myalgias, fever, leukopenia, thrombocytopenia, and hepatitis.  During this hospital admission he had an extensive evaluation by infectious disease with a negative workup.  PAST MEDICAL HISTORY:  Remarkable for a history of sarcoid involving lung, liver, and spleen.  The patient was discharged on Doxycycline 100 mg b.i.d. two days ago.  Since his discharge he has remained quite weak and anorexic, although, he denies any frank fever or chills.  His p.o. intake has been quite marginal.  On the day of admission he became weaker with intractable nausea and vomiting.  The patient is now admitted for further evaluation and treatment of his acute febrile illness.  ER evaluation included electrolytes that were unremarkable except for potassium level of 3.0.  Transaminases were improved with an SGOT of 81 and SGPT of 159.  Alkaline phosphatase was normal.  The patients albumin was surprisingly normal at 4.1 inspite of a 22 pound weight loss since his acute illness.  The patients leukopenia and thrombocytopenia also were improved.  PAST MEDICAL HISTORY:  Otherwise fairly noncontributory.  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  The patient has not required any treatment for his sarcoid.  He has had wisdom teeth extractions in the past.  Otherwise past medical history is negative.  ALLERGIES:   MORPHINE SULFATE.  During his prior hospital admission the patient had extensive evaluation which included blood cultures that were negative.  The patients white count was 3.2 on admission, but fell as low as 1.9.  Sedimentation rate was normal at 12. Initial LFTs were minimally elevated on admission, but later peaked to the 300 range.  Antibiotic therapy included doxycycline only, but did receive  some Rocephin prior to his hospital admission.  The patient was also evaluated by hematology.  Serology for CMV, EBV, HIV, RMSF as well as hepatitis serology and Urlichia serology were all negative.  HIV viral load by PCR, however, is pending.  A bone marrow biopsy was discussed during his prior hospital admission, but was elected to observe the patients clinical response before proceeding.  FAMILY HISTORY: SOCIAL HISTORY:  Please see prior chart for details.  The patient is a recovery room RN.  His wife is also an Charity fundraiser.  They have two children.  PHYSICAL EXAMINATION:  VITAL SIGNS:  Blood pressure 130/80, pulse 62, temperature 99 degrees, respiratory rate normal.  GENERAL:  A well-developed, thin, white male who was quite uncomfortable due to severe myalgias involving both lower extremities.  He was alert and oriented.  SKIN:  Revealed some chronic flakiness and dryness involving his scalp, but no new dermatitis.  HEENT: NECK:  Conjunctivae clear.  No petechiae noted.  Tympanic membranes and canals normal.  Oropharynx was perhaps slightly  erythematous.  There was no exudate noted.  Neck was supple without meningismus.  There was no adenopathy.  CHEST:  Essentially clear.  HEART:  Slow regular rhythm.  There is no murmur appreciated.  ABDOMEN:  Soft and nontender.  There was no organomegaly noted.  The spleen could not be palpated inspite of his documented splenomegaly.  GENITOURINARY:  External genitalia normal.  LYMPHATICS:  Negative.  EXTREMITIES:  Full peripheral pulses with  no edema.  Palpation of his symptomatic muscles involving his lower extremities reveal no real spasm or tenderness.  NEUROLOGICAL:  Negative.  IMPRESSION:  Acute febrile illness manifested by myalgias, anorexia, nausea, leukopenia, thrombocytopenia, and hepatitis.  DISPOSITION:  The patient will be admitted to the hospital.  He will be supported with IV fluids.  Repeat blood cultures and sed rate will be obtained.  An ACE level will also be obtained.  He will be supported and treated symptomatically.  The patient will be seen in consultation by hematology and ID. DD:  03/31/01 TD:  04/01/01 Job: [id number]

## 2011-03-18 NOTE — Consult Note (Signed)
[place]. [facility]  Patient:    Bruce Woods, Bruce Woods                     MRN: [id number] Proc. Date: 03/24/01 Adm. Date:  [id number] Attending:  Dorena Cookey CC:         Claretta Fraise, M.D.  Dr. 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  He was eventually admitted May 21 for workup.  He was seen by Genene Churn. Granfortuna, M.D., back in the year 2000 secondary to an MRI showing a questionable marrow process. On workup, he was not found to have myeloma, and it was felt that his imaging was showing benign hemangiomas.  He was noted to have some findings then on chest imaging that led to a video-assisted thoracotomy July 2000 by Gwenith Daily. Tyrone Sage, M.D., that was positive for sarcoid.  Up until now, this has not required treatment.  Since being here, his workup has included the following: He has had serial CBCs.  From May 22, his white count was 1.9; May 23 it was 1.9; on May 24, 1.8; May 25, today, 1.5.  His neutrophil count on May 22 was 1000; on May 23 it was 1000; on May 24 it was 1100, but not checked today. Platelet count was 104,000 on May 22; 96,000 on May 23; 85,000 on May 24; and 74,000 today.  Hemoglobin was 13.1 on May 22; 13.7 on May 23; 13.7 on May 24; and 14 today.  MCV is 83, which is normal.  His differential from May 24 shows 60% segmented neutrophils, 31% lymphocytes, 8% monocytes.  Erythrocyte sedimentation rate is 12.  Blood cultures are no growth x 2 times three days. From May 25, sodium 134, potassium 3.6, creatinine 1.0, calcium 7.8. Serologies for CMV and EBV virus infections have been unrevealing.  No radiologic studies thus far.  He continues to have occasional low-grade temperatures, 100-100.6  degree range.  MEDICATIONS:  Home medications:  Vioxx.  Medications now include Ambien, Darvocet, and Zofran.  ALLERGIES:  MORPHINE.  FAMILY HISTORY:  Positive for prostate cancer in the paternal grandfather. Negative for hematologic disorders.  SOCIAL HISTORY:  Nonsmoker.  Occasional alcohol use.  Married with children.  REVIEW OF SYSTEMS:  As above.  Diarrhea x 1 day, which resolved.  Tolerating some p.o. liquids.  History of irritable bowel syndrome.  PHYSICAL EXAMINATION:  VITAL SIGNS:  Temperature 99.4 degrees, pulse 82, respirations 20, blood pressure 113/74.  O2 saturation 93%.  LYMPHATIC:  There was no cervical, supraclavicular, axillary, or inguinal adenopathy.  SKIN:  I do not notice any rashes.  ABDOMEN:  There was no hepatosplenomegaly.  ASSESSMENT:  Borderline neutropenia, moderate thrombocytopenia.  However, he does have a normal in this young male with a history of untreated sarcoid. His infectious disease workup is negative thus far.  I doubt this is a primary hematologic malignancy such as leukemia or lymphoma, without anemia and given the rapidity of onset.  I discussed the bone marrow biopsy and aspiration with him, which he would like to defer if possible.  I do not think this is a drug reaction.  This would still fit best with secondary bone marrow and hematologic process, perhaps from an infection.  PLAN:  Will review his blood smear and check his CBC q.d.   Will check a CMET and LDH tomorrow.  Will hold on Nupogen and bone marrow exam for now. DD:  03/25/01 TD:  03/26/01 Job: [id number]

## 2011-03-18 NOTE — Discharge Summary (Signed)
. [facility]  Patient:    Bruce Woods, Bruce Woods                     MRN: [id number] Adm. Date:  [id number] Disc. Date: 03/29/01 Attending:  Dorena Woods Dictator:   Bruce Woods, P.A. CC:         Bruce Woods, M.D. [facility]  Bruce Woods, M.D.  Bruce Woods, M.D.   Discharge Summary  DISCHARGE DIAGNOSES: 1. Myalgias. 2. Fever. 3. Leukopenia. 4. Thrombocytopenia. 5. Hepatitis.  HISTORY OF PRESENT ILLNESS:  Bruce Woods is a 50 year old white male who developed an acute illness over 48 hours prior to this admission with fever, fatigue, and myalgias.  PAST MEDICAL HISTORY: 1. Sarcoid lung, liver, and spleen. 2. Benign hemangiomas of the spine by MRI in 2000. 3. Status post video-assisted thoracotomy July 2000, that was positive for    sarcoid.  The patient has not required treatment. 4. History of wisdom teeth extraction.  HOSPITAL COURSE: #1 - INFECTIOUS DISEASE:  The patient presented with what appeared to be a viral-like syndrome with dehydration and myalgias.  The patient was aggressively hydrated.  The patient was not treated with antibiotics since there was no evidence of a bacterial infection.  In fact, the patients white count was 3.2 on admission.  The patients fever did began to resolve, but the patients white count fell to 1.9.  At the same time, we also noted a drop in his platelet count.  Blood cultures remained negative.  Urinalysis was negative.  Sedimentation rate was normal at 12.  Chest x-ray was without any infiltrates.  A rapid strep was also negative.  The patient had some mildly elevated LFTs with an SGOT of 45 and an SGPT of 24, which was normal.  The patient seemed to respond somewhat after arrival to the hospital.  Of note, the patient had received 1 g of Rocephin on Mar 19, 2001, in the office, and then received 2 g of Rocephin on Mar 20, 2001, in the emergency department, but that was the extent of the  antibiotics he received.  Because of the patients persistent leukopenia, we asked infectious disease to see the patient.  The patient was evaluated by Dr. Orvan Woods.  He did suspect a nonacute infectious illness with the differential including CMV, EBV, HIV, early spotless RMSF, and ______.  He recommended empiric treatment with doxycycline.  The patient was not able to initially tolerate oral doxycycline secondary to nausea and vomiting; therefore, it was changed to IV.  However, on administering it IV he had significant burning pain, and there was a low thought initially for a rickettsial process, and doxycycline was discontinued. EBV serology revealed a remote inactive infection, and CMV IgM was negative. The patient then developed hepatitis with transaminases in the 300s.  This was also associated with pharyngitis.  Per Dr. Elsie Woods, this clinically looked like mononucleosis, but the most common cause of Epstein-Barr virus and CMV had been ruled out.  He became concerned again about an ______ , given the elevated liver enzymes and recommended restarting the doxycycline.  We also used Magic Mouthwash for the patients mouth and throat pain.  We also obtained HIV, which was nonreactive.  Hepatitis serologies were also unremarkable.  Because of the patients persistent leukopenia and thrombocytopenia, we did ask for hematology to see the patient to rule out any type of hematologic process.  The patient was evaluated by Dr. Catha Woods.  He doubted this  was a hematologic malignancy such as leukemia or lymphoma without anemia and given the rapid onset.  He did discuss bone marrow biopsy and aspiration with the patient, which the patient preferred to hold off on at this time.  Dr. Catha Woods doubted this was a drug reaction.  He did review the patients blood smear, which did reveal some toxic granuloma but no blasts. There were also some giant platelets, which would go along with a reactive process.  The RBC  morphology was normal.  He favored watching the patient without proceeding with bone marrow exam at this time.  The patients peripheral smear was evaluated again with both the hematologist and the pathologist to rule out any rickettsial organisms, of which there were none found.  There had been some improvement in the patients thrombocytopenia but no significant change in his leukopenia.  Symptomatically, the patient has improved and is anxious to be discharged home.  Again, at this time, hematology is not planning on a bone marrow biopsy or the need for Neupogen at this time.  Dr. Orvan Woods agrees there is nothing that needs to be done in the hospital that could not be done at home, but he would like to follow up with the patient as an outpatient.  DISCHARGE LABORATORY DATA:  Acute hepatitis serologies were negative. Hepatitis B surface antigen negative.  Anti-HCV negative.  Anti-HAV IgM negative.  Hepatitis B core IgM negative.  SGOT was 218, SGPT was 203.  White count was 1.6, hemoglobin 13.3, platelet count was 78.  Sedimentation rate was 12.  HIV was nonreactive.  HIV viral load is pending.  Cortisol was 11.7. Blood cultures remain negative x 2.  Rapid strep was negative.  CMV antibody IgM was 0.18, which is negative.  Epstein-Barr virus capsid IgG was 9.12.  EBV capsid IgM was 0.43, and EBV antibody to nuclear antigen was 4.5.  EBV antibody to ______ D antigen was 2.04.  DISCHARGE MEDICATIONS: 1. Doxycycline 100 mg b.i.d. to complete a 10-day course. 2. Magic Mouthwash 5 mL swish and swallow before meals and bedtime for five    days.  FOLLOW-UP:  Follow up with Dr. Ruthine Woods in the next week for a CBC and CMET to monitor the patients transaminases.  To follow up with Dr. Orvan Woods as instructed. DD:  03/29/01 TD:  03/29/01 Job: [id number]

## 2011-03-18 NOTE — Discharge Summary (Signed)
[place]. [facility]  Patient:    Bruce, Woods                       MRN: [id number] Adm. Date:  03/31/01 Disc. Date: 04/04/01 Dictator:   Cornell Barman, P.A. CC:         Dr. Mena Goes, M.D. [facility]   Discharge Summary  DISCHARGE DIAGNOSES: 1. Nausea and vomiting. 2. Febrile illness. 3. Pancytopenia. 4. Sarcoidosis.  HISTORY OF PRESENT ILLNESS:  The patient is a 50 year old white male who had been hospitalized Mar 20, 2001 through Mar 29, 2001 for an acute febrile illness associated with myalgias, fever, leukopenia, thrombocytopenia, and hepatitis.  The patient had a complete infectious disease evaluation and an essentially negative workup.  PAST MEDICAL HISTORY: 1. History of sarcoid involving the lung, liver, and spleen. 2. Benign hemangiomas of the spine documented by MRI 2000. 3. Status post video-assisted thoracotomy in July 2000 confirming sarcoid lung    disease.  HOSPITAL COURSE:  GI:  The patient presented with nausea, vomiting, and anorexia.  This improved with IV fluids, antiemetics, and withdrawal of doxycycline.  Etiology of his gastritis was thought to be secondary to medication, doxycycline.  Infectious disease did see the patient and did not feel that the doxycycline needed to be continued.  The patients symptoms did improve.  The patient did have an endoscopy on April 03, 2001, and this was a normal exam.  DISCHARGE DIAGNOSTIC STUDIES:  Leptospira antibodies were not detected. Angiotensin converting enzyme was 40.  ANA was negative.  Lipase 67. Prealbumin 25.7.  SGPT was 75.  White count was 3.7.  Platelet count was 167. Hemoglobin 15.7.  DISCHARGE MEDICATIONS:  The patient was instructed to use Celebrex as needed.  FOLLOW-UP APPOINTMENT:  The patient was to see Dr. Ruthine Dose on Thursday at 4:30 p.m.  The patient was also to follow up for an outpatient bone marrow biopsy and aspirate that Dr. Ruthine Dose was to arrange. DD:   04/25/01 TD:  04/25/01 Job: [id number]

## 2011-04-05 ENCOUNTER — Other Ambulatory Visit (INDEPENDENT_AMBULATORY_CARE_PROVIDER_SITE_OTHER): Payer: 59

## 2011-04-05 DIAGNOSIS — Z113 Encounter for screening for infections with a predominantly sexual mode of transmission: Secondary | ICD-10-CM

## 2011-04-05 DIAGNOSIS — B20 Human immunodeficiency virus [HIV] disease: Secondary | ICD-10-CM

## 2011-04-05 DIAGNOSIS — Z79899 Other long term (current) drug therapy: Secondary | ICD-10-CM

## 2011-04-05 LAB — CBC WITH DIFFERENTIAL/PLATELET
Basophils Absolute: 0 10*3/uL (ref 0.0–0.1)
Basophils Relative: 0 % (ref 0–1)
Eosinophils Absolute: 0.1 10*3/uL (ref 0.0–0.7)
Eosinophils Relative: 1 % (ref 0–5)
HCT: 47 % (ref 39.0–52.0)
MCHC: 34.9 g/dL (ref 30.0–36.0)
MCV: 92.7 fL (ref 78.0–100.0)
Monocytes Absolute: 0.3 10*3/uL (ref 0.1–1.0)
RDW: 13.6 % (ref 11.5–15.5)

## 2011-04-05 LAB — LIPID PANEL
LDL Cholesterol: 91 mg/dL (ref 0–99)
VLDL: 27 mg/dL (ref 0–40)

## 2011-04-06 LAB — COMPLETE METABOLIC PANEL WITH GFR
AST: 17 U/L (ref 0–37)
Albumin: 4.9 g/dL (ref 3.5–5.2)
Alkaline Phosphatase: 75 U/L (ref 39–117)
Chloride: 104 mEq/L (ref 96–112)
GFR, Est Non African American: >60 mL/min (ref >60)
Glucose, Bld: 94 mg/dL (ref 70–99)
Potassium: 4.5 mEq/L (ref 3.5–5.3)
Sodium: 139 mEq/L (ref 135–145)
Total Protein: 7.1 g/dL (ref 6.0–8.3)

## 2011-04-06 LAB — T-HELPER CELL (CD4) - (RCID CLINIC ONLY)
CD4 % Helper T Cell: 25 % — ABNORMAL LOW (ref 33–55)
CD4 T Cell Abs: 400 uL (ref 400–2700)

## 2011-04-19 ENCOUNTER — Encounter: Payer: Self-pay | Admitting: Internal Medicine

## 2011-04-19 ENCOUNTER — Ambulatory Visit (INDEPENDENT_AMBULATORY_CARE_PROVIDER_SITE_OTHER): Payer: 59 | Admitting: Internal Medicine

## 2011-04-19 VITALS — BP 152/84 | HR 71 | Temp 97.9°F | Ht 68.0 in | Wt 176.0 lb

## 2011-04-19 DIAGNOSIS — B20 Human immunodeficiency virus [HIV] disease: Secondary | ICD-10-CM

## 2011-04-19 DIAGNOSIS — L259 Unspecified contact dermatitis, unspecified cause: Secondary | ICD-10-CM

## 2011-04-19 DIAGNOSIS — F329 Major depressive disorder, single episode, unspecified: Secondary | ICD-10-CM

## 2011-04-19 DIAGNOSIS — L309 Dermatitis, unspecified: Secondary | ICD-10-CM | POA: Insufficient documentation

## 2011-04-19 NOTE — Assessment & Plan Note (Signed)
His HIV infection remains under excellent control. I will continue Atripla.

## 2011-04-19 NOTE — Assessment & Plan Note (Signed)
His depression is well controlled with Celexa. He does not feel that he is ready to try tapering off of it, particularly since he now has the added stress of going to school part-time. He is exercising regularly and feels that this helps with his mood.

## 2011-04-19 NOTE — Assessment & Plan Note (Signed)
I do not believe that this is recurrent shingles census on the opposite side from the definite shingles he had last year. I suspect he has some form of contact dermatitis. He has been doing yard work and may have poison ivy. I will have him stop the acyclovir. The rash is very mild and improving so I do not think he needs any further therapy.

## 2011-04-19 NOTE — Progress Notes (Signed)
  Subjective:    Patient ID: Bruce Woods, male    DOB: [birth year], 50 y.o.   MRN: [id number]  HPI Bruce Woods is in for his routine visit. He has not missed any doses of his Atripla since his last visit. 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 He is enjoying that so far. 2 days ago he developed some itching and burning around his right flank and noticed some spots. He was worried that he might have recurrent shingles and restarted acyclovir.    Review of Systems     Objective:   Physical Exam  Constitutional: No distress.  HENT:  Mouth/Throat: Oropharynx is clear and moist. No oropharyngeal exudate.  Cardiovascular: Normal rate, regular rhythm and normal heart sounds.   No murmur heard. Pulmonary/Chest: Breath sounds normal. He has no wheezes. He has no rales.  Skin:       PS3 red papules at the waistline on the right side.  Psychiatric: He has a normal mood and affect.          Assessment & Plan:

## 2011-07-26 LAB — T-HELPER CELL (CD4) - (RCID CLINIC ONLY): CD4 T Cell Abs: 320 — ABNORMAL LOW

## 2011-08-11 LAB — T-HELPER CELL (CD4) - (RCID CLINIC ONLY): CD4 T Cell Abs: 490

## 2011-08-19 ENCOUNTER — Ambulatory Visit (INDEPENDENT_AMBULATORY_CARE_PROVIDER_SITE_OTHER): Payer: 59 | Admitting: Internal Medicine

## 2011-08-19 ENCOUNTER — Encounter: Payer: Self-pay | Admitting: Internal Medicine

## 2011-08-19 VITALS — BP 110/72 | HR 62 | Temp 98.5°F | Resp 14 | Wt 176.2 lb

## 2011-08-19 DIAGNOSIS — F3289 Other specified depressive episodes: Secondary | ICD-10-CM

## 2011-08-19 DIAGNOSIS — N529 Male erectile dysfunction, unspecified: Secondary | ICD-10-CM | POA: Insufficient documentation

## 2011-08-19 DIAGNOSIS — R6882 Decreased libido: Secondary | ICD-10-CM

## 2011-08-19 DIAGNOSIS — R5383 Other fatigue: Secondary | ICD-10-CM | POA: Insufficient documentation

## 2011-08-19 DIAGNOSIS — R5381 Other malaise: Secondary | ICD-10-CM

## 2011-08-19 DIAGNOSIS — F329 Major depressive disorder, single episode, unspecified: Secondary | ICD-10-CM

## 2011-08-19 MED ORDER — ALPRAZOLAM 0.5 MG PO TABS: 0.5000 mg | ORAL_TABLET | Freq: Every evening | ORAL | Status: AC | PRN

## 2011-08-19 NOTE — Assessment & Plan Note (Addendum)
Severe fatigue , decreased libido, difficulty w/ orgasms, some ED. No evidence of OSA on clinical grounds Labs 6-12 ok On SSRIs x years w/o sexual side effects Plan: Labs, see instructions Treat insomnia Treat ED if so desire (declined for now) If all labs normal: ?stress

## 2011-08-19 NOTE — Progress Notes (Signed)
  Subjective:    Patient ID: Bruce Woods, male    DOB: May 14, 1961, 50 y.o.   MRN: [id number]  HPI Chief complaint today is lack of energy, he is feeling exhausted for the last 2 months. Also for the last 6 months he has noted decrease in sex drive and some ED. He admits that he is extremely busy, besides work he is going to school full time. He is having some difficulty sleeping, he is unable to "decompres" at night. He tried some melatonin with moderate success, he tried Ambien but he did not like it at all. Wonders about low testosterone.  Past Medical History  Diagnosis Date  . DEPRESSION   . HIV DISEASE   . Sarcoidosis 2000    question of    Review of Systems Denies chest pain or shortness of breath No Lower extremity edema. Very rarely has palpitations, certainly not in the last 6 months. Denies snoring or feeling sleepy though out the day  No dysuria or gross hematuria, no difficulty urinating. Self testicular exam normal. Also has developed some difficulty with orgasms. Admits to stress, very busy schedule    Objective:   Physical Exam  Constitutional: He is oriented to person, place, and time. He appears well-developed and well-nourished. No distress.  Cardiovascular: Normal rate, regular rhythm and normal heart sounds.   No murmur heard. Pulmonary/Chest: Effort normal and breath sounds normal. No respiratory distress. He has no wheezes. He has no rales.  Abdominal: Soft. Bowel sounds are normal. He exhibits no distension. There is no tenderness. There is no rebound and no guarding.  Musculoskeletal: He exhibits no edema.  Neurological: He is alert and oriented to person, place, and time.  Skin: He is not diaphoretic.  Psychiatric: He has a normal mood and affect. His behavior is normal. Judgment and thought content normal.          Assessment & Plan:

## 2011-08-19 NOTE — Patient Instructions (Signed)
Please came back fasting: We need to get a rainbow in case we need further testing: Free and total  testosterone ----dx fatigue, decrease libido CBC- TSH- Vit D- B12 --- dx fatigue

## 2011-08-20 ENCOUNTER — Encounter: Payer: Self-pay | Admitting: Internal Medicine

## 2011-08-20 NOTE — Assessment & Plan Note (Signed)
See "fatigue" 

## 2011-08-20 NOTE — Assessment & Plan Note (Signed)
Well controlled 

## 2011-08-22 ENCOUNTER — Other Ambulatory Visit (INDEPENDENT_AMBULATORY_CARE_PROVIDER_SITE_OTHER): Payer: 59

## 2011-08-22 DIAGNOSIS — R5383 Other fatigue: Secondary | ICD-10-CM

## 2011-08-22 LAB — CBC WITH DIFFERENTIAL/PLATELET
Basophils Relative: 0.3 % (ref 0.0–3.0)
Eosinophils Absolute: 0.1 10*3/uL (ref 0.0–0.7)
Hemoglobin: 16.4 g/dL (ref 13.0–17.0)
Lymphocytes Relative: 29.6 % (ref 12.0–46.0)
MCHC: 35.1 g/dL (ref 30.0–36.0)
MCV: 96.9 fl (ref 78.0–100.0)
Neutro Abs: 4 10*3/uL (ref 1.4–7.7)
RBC: 4.81 Mil/uL (ref 4.22–5.81)

## 2011-08-22 NOTE — Progress Notes (Signed)
Labs only

## 2011-08-23 ENCOUNTER — Other Ambulatory Visit: Payer: Self-pay | Admitting: Dermatology

## 2011-08-23 LAB — VITAMIN D 25 HYDROXY (VIT D DEFICIENCY, FRACTURES): Vit D, 25-Hydroxy: 19 ng/mL — ABNORMAL LOW (ref 30–89)

## 2011-08-30 ENCOUNTER — Ambulatory Visit: Payer: 59 | Admitting: Internal Medicine

## 2011-08-31 ENCOUNTER — Telehealth: Payer: Self-pay

## 2011-08-31 ENCOUNTER — Other Ambulatory Visit: Payer: Self-pay

## 2011-08-31 MED ORDER — VITAMIN D (ERGOCALCIFEROL) 1.25 MG (50000 UNIT) PO CAPS: 50000.0000 [IU] | ORAL_CAPSULE | ORAL | Status: DC

## 2011-08-31 NOTE — Progress Notes (Signed)
Addended by: Beverely Low on: 08/31/2011 11:18 AM   Modules accepted: Orders

## 2011-08-31 NOTE — Telephone Encounter (Signed)
RX left at the front desk with a copy of the lab results attached for the patients wife to pick up 09/01/11

## 2011-09-01 ENCOUNTER — Other Ambulatory Visit: Payer: Self-pay | Admitting: Internal Medicine

## 2011-09-01 DIAGNOSIS — Z0000 Encounter for general adult medical examination without abnormal findings: Secondary | ICD-10-CM

## 2011-09-02 ENCOUNTER — Other Ambulatory Visit (INDEPENDENT_AMBULATORY_CARE_PROVIDER_SITE_OTHER): Payer: 59

## 2011-09-02 DIAGNOSIS — Z0000 Encounter for general adult medical examination without abnormal findings: Secondary | ICD-10-CM

## 2011-09-02 NOTE — Progress Notes (Signed)
Labs only

## 2011-09-05 LAB — TESTOSTERONE, FREE, TOTAL, SHBG
Sex Hormone Binding: 28 nmol/L (ref 13–71)
Testosterone, Free: 96.5 pg/mL (ref 47.0–244.0)
Testosterone-% Free: 2.3 % (ref 1.6–2.9)

## 2011-09-07 ENCOUNTER — Telehealth: Payer: Self-pay

## 2011-09-12 IMAGING — CR DG CHEST 2V
2 series · 2 of 2 positions shown · non-contrast
Comparison: None.

CLINICAL DATA: Chest pain.  History of sarcoidosis.  History of
AIDS.

CHEST - 2 VIEW

[w chest pa]
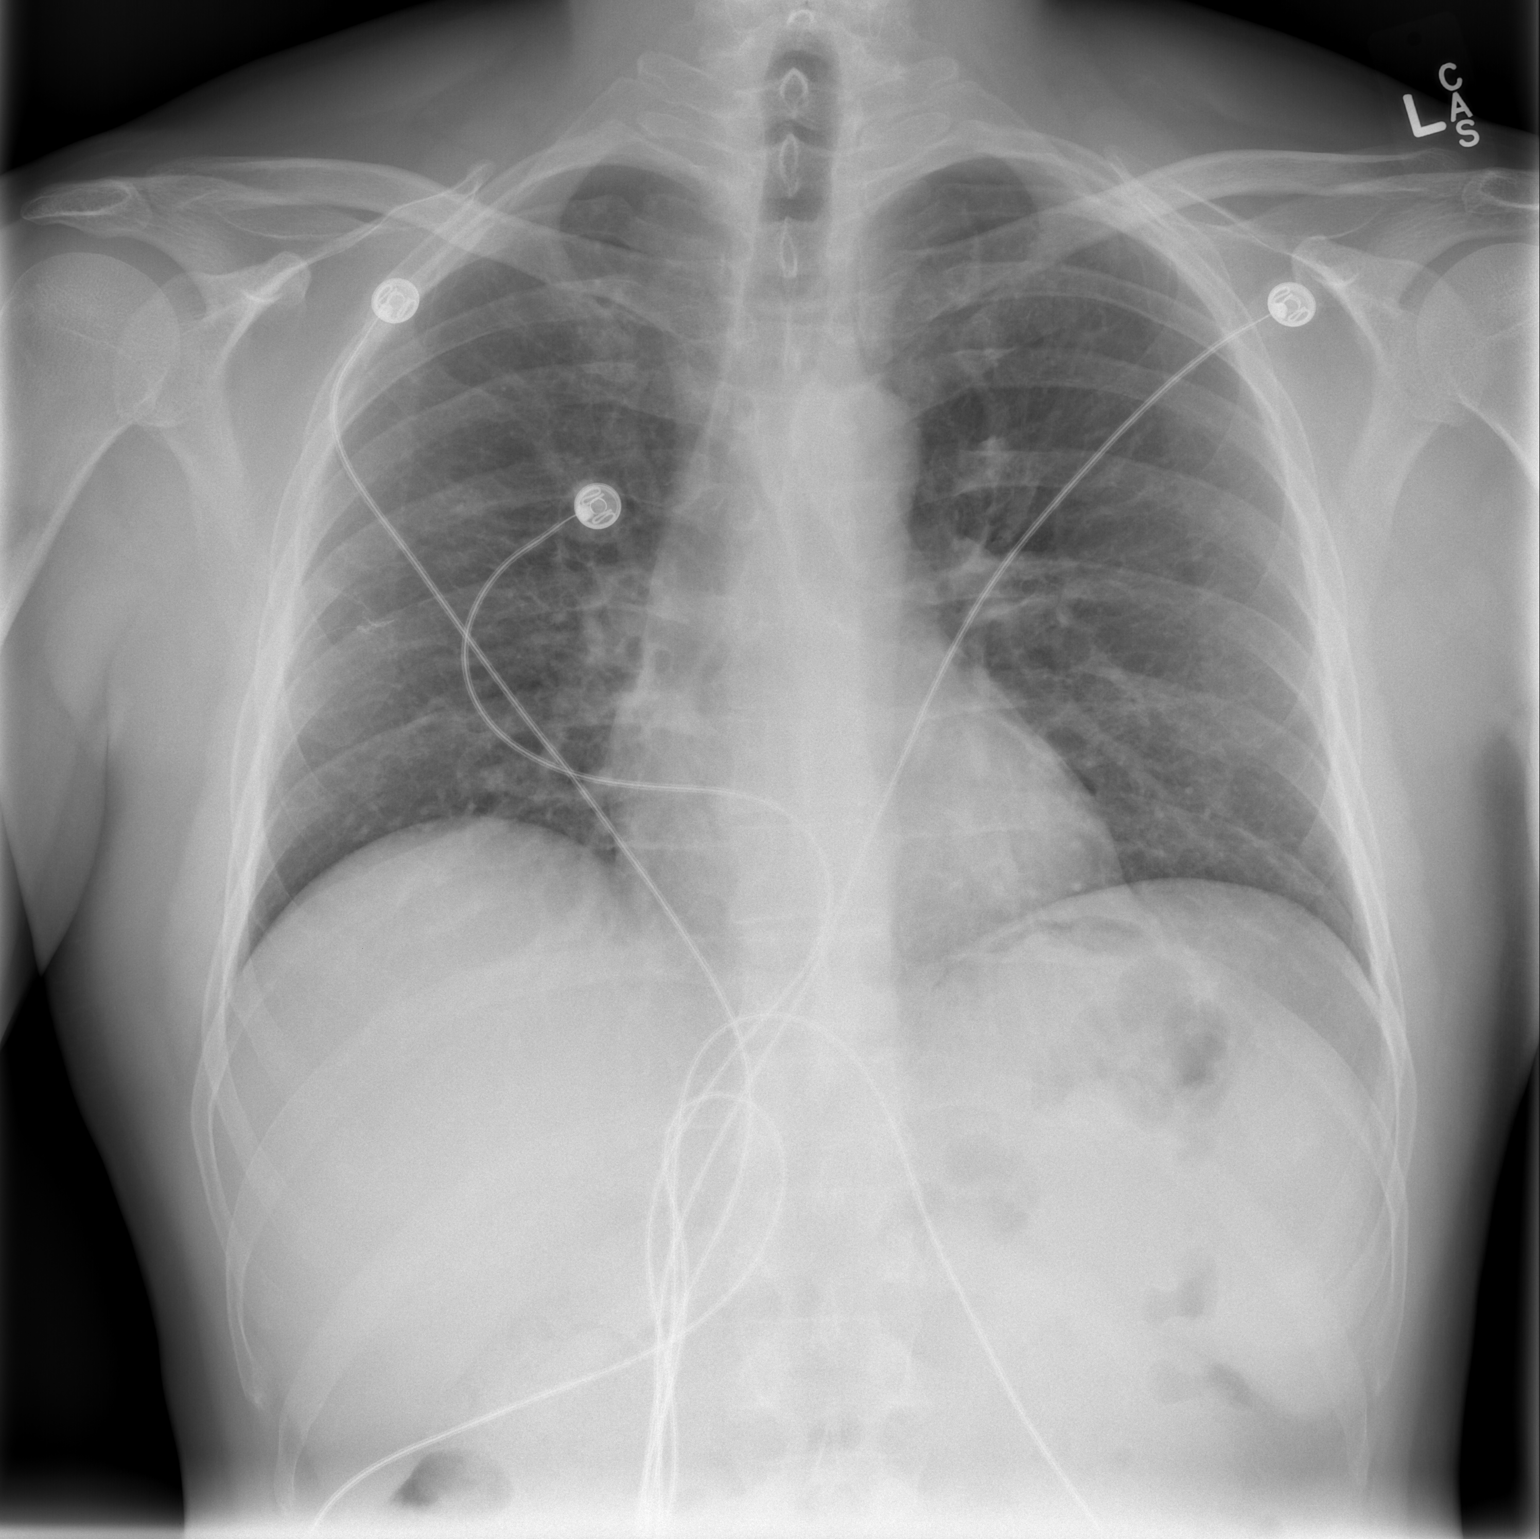

[w chest lat]
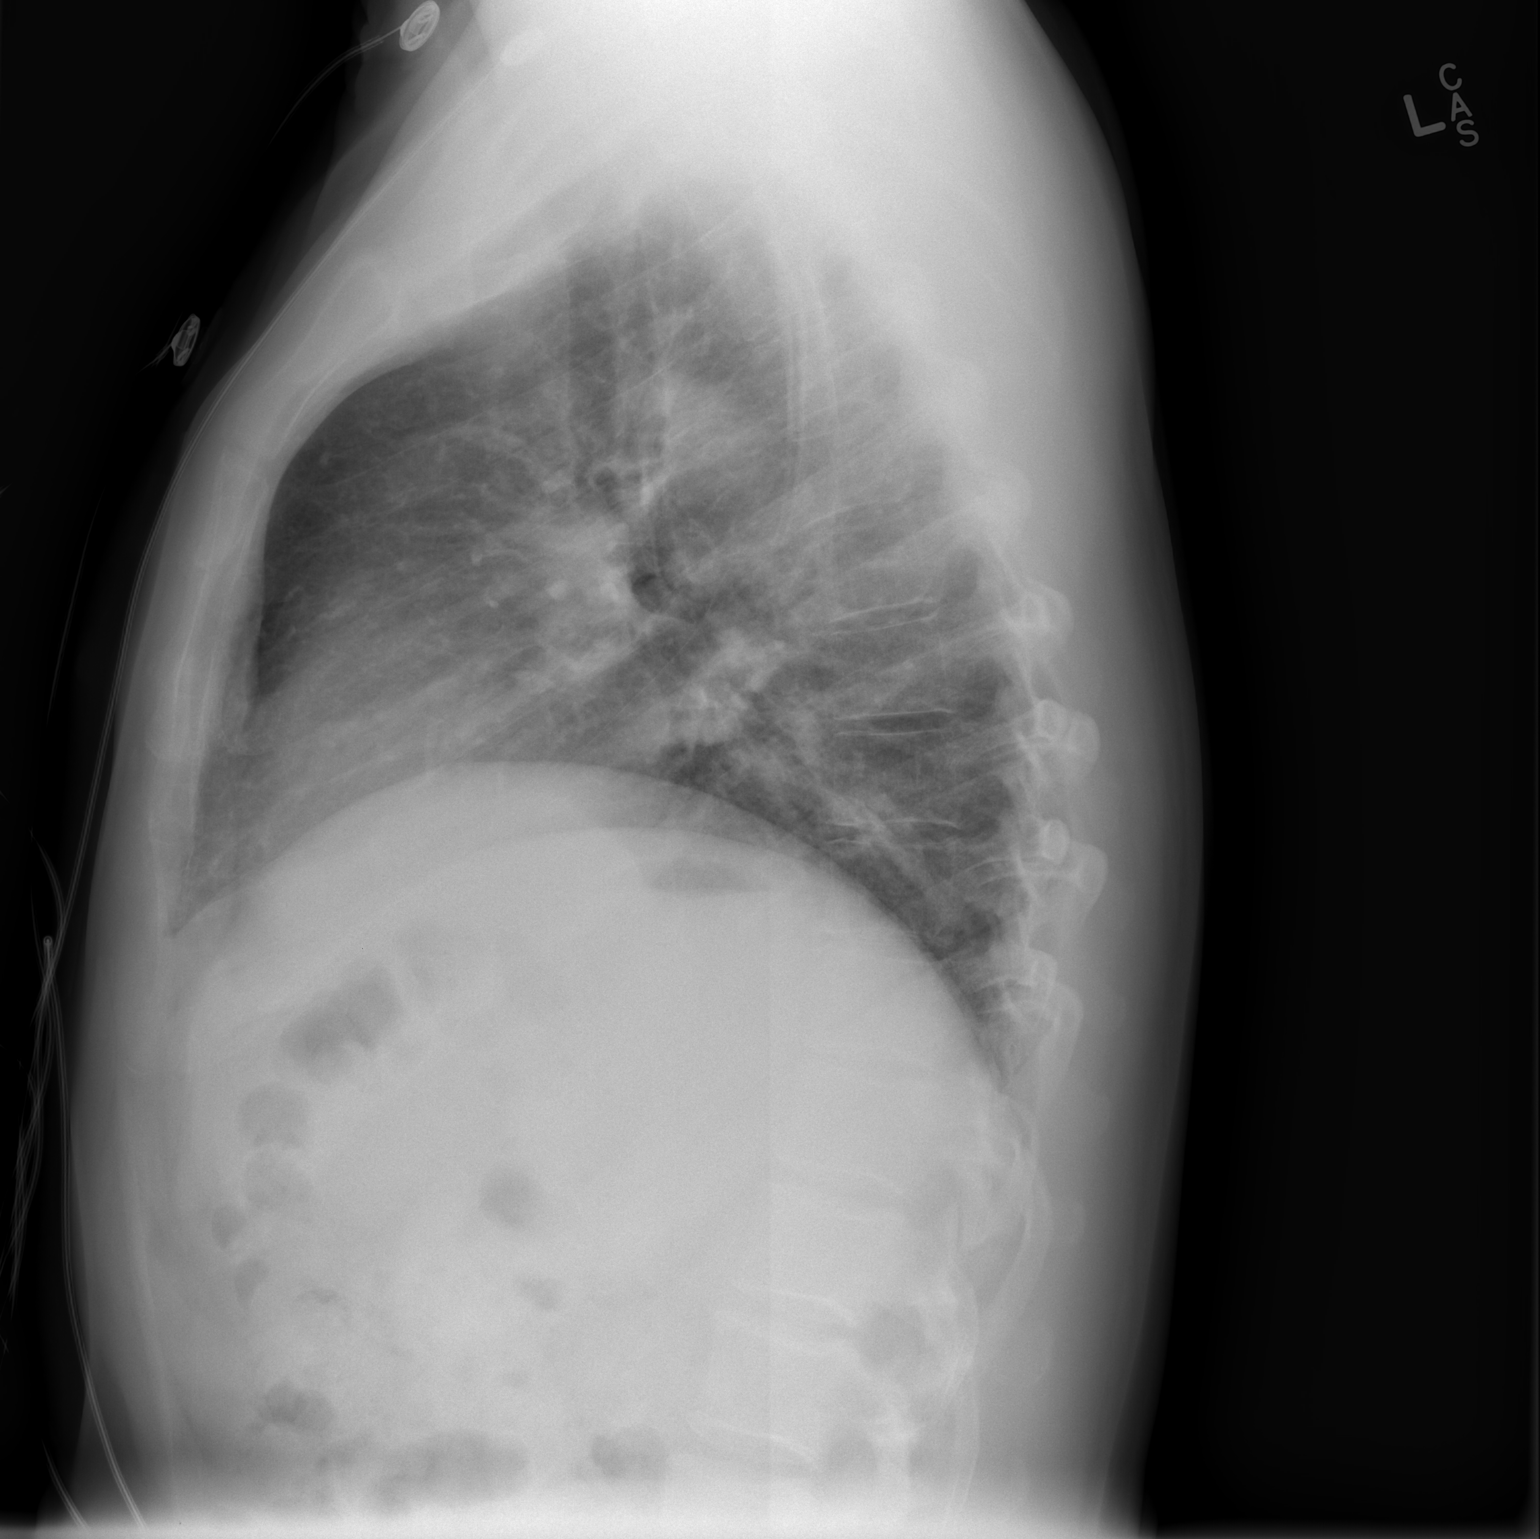

[2 of 2 positions shown; findings below may reference images not displayed]

FINDINGS: Normal cardiac and mediastinal silhouette.  Mild
interstitial prominence is nonspecific.  No effusion or
pneumothorax.  No consolidation or focal airspace disease.  Linear
focus of scarring right midlung zone described on previous chest of
IMPRESSION: Mild scarring.  Nonspecific interstitial prominence.  No apparent
active infiltrates.  No visible adenopathy.

## 2011-09-12 NOTE — Telephone Encounter (Signed)
Open in error

## 2011-09-21 ENCOUNTER — Other Ambulatory Visit: Payer: 59

## 2011-09-23 ENCOUNTER — Other Ambulatory Visit: Payer: Self-pay | Admitting: Internal Medicine

## 2011-09-23 DIAGNOSIS — B20 Human immunodeficiency virus [HIV] disease: Secondary | ICD-10-CM

## 2011-09-27 ENCOUNTER — Other Ambulatory Visit (INDEPENDENT_AMBULATORY_CARE_PROVIDER_SITE_OTHER): Payer: 59

## 2011-09-27 ENCOUNTER — Other Ambulatory Visit: Payer: Self-pay | Admitting: Infectious Disease

## 2011-09-27 DIAGNOSIS — B20 Human immunodeficiency virus [HIV] disease: Secondary | ICD-10-CM

## 2011-09-27 DIAGNOSIS — Z79899 Other long term (current) drug therapy: Secondary | ICD-10-CM

## 2011-09-27 DIAGNOSIS — Z113 Encounter for screening for infections with a predominantly sexual mode of transmission: Secondary | ICD-10-CM

## 2011-09-27 LAB — COMPREHENSIVE METABOLIC PANEL
ALT: 26 U/L (ref 0–53)
AST: 24 U/L (ref 0–37)
Alkaline Phosphatase: 72 U/L (ref 39–117)
BUN: 16 mg/dL (ref 6–23)
Chloride: 102 mEq/L (ref 96–112)
Creat: 0.98 mg/dL (ref 0.50–1.35)
Potassium: 4.1 mEq/L (ref 3.5–5.3)

## 2011-09-27 LAB — CBC
HCT: 46.8 % (ref 39.0–52.0)
MCHC: 35.5 g/dL (ref 30.0–36.0)
MCV: 92.5 fL (ref 78.0–100.0)
Platelets: 172 10*3/uL (ref 150–400)
RDW: 13.4 % (ref 11.5–15.5)
WBC: 5.8 10*3/uL (ref 4.0–10.5)

## 2011-09-27 LAB — LIPID PANEL
HDL: 32 mg/dL — ABNORMAL LOW (ref >39)
LDL Cholesterol: 90 mg/dL (ref 0–99)
Total CHOL/HDL Ratio: 4.7 Ratio

## 2011-09-27 LAB — RPR

## 2011-09-28 LAB — T-HELPER CELL (CD4) - (RCID CLINIC ONLY): CD4 T Cell Abs: 410 uL (ref 400–2700)

## 2011-09-29 LAB — HIV-1 RNA QUANT-NO REFLEX-BLD: HIV-1 RNA Quant, Log: 1.53 {Log} — ABNORMAL HIGH (ref <1.30)

## 2011-10-06 ENCOUNTER — Ambulatory Visit (INDEPENDENT_AMBULATORY_CARE_PROVIDER_SITE_OTHER): Payer: 59 | Admitting: Internal Medicine

## 2011-10-06 ENCOUNTER — Other Ambulatory Visit: Payer: Self-pay | Admitting: *Deleted

## 2011-10-06 ENCOUNTER — Encounter: Payer: Self-pay | Admitting: Internal Medicine

## 2011-10-06 VITALS — BP 153/100 | HR 75 | Temp 97.9°F | Ht 68.0 in | Wt 180.0 lb

## 2011-10-06 DIAGNOSIS — B20 Human immunodeficiency virus [HIV] disease: Secondary | ICD-10-CM

## 2011-10-06 DIAGNOSIS — F329 Major depressive disorder, single episode, unspecified: Secondary | ICD-10-CM

## 2011-10-06 MED ORDER — EFAVIRENZ-EMTRICITAB-TENOFOVIR 600-200-300 MG PO TABS: 1.0000 | ORAL_TABLET | Freq: Every day | ORAL | Status: DC

## 2011-10-06 NOTE — Assessment & Plan Note (Signed)
I suspect that he has had a recent exacerbation of his chronic depression. He is describing more fatigued, decreased libido and stress. His testosterone level was within normal range both total and free testosterone. He borders on being obsessive-compulsive and while this is usually a positive treat for him he can also make it difficult for him to deal with periods of added stress when his routine gets disrupted. He seems to have some self-awareness about this and seems to be trying to adjust to schedule accordingly. I've asked him to call me if he is having more difficulties between now and his next visit.

## 2011-10-06 NOTE — Progress Notes (Signed)
  Subjective:    Patient ID: Bruce Woods, male    DOB: 1960/11/07, 50 y.o.   MRN: [id number]  HPI Bruce Woods is in for his routine visit. 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 He had infection there and underwent a root canal and was started on by mouth penicillin. He has not had any dental pain or problems healing from that procedure.  He has not missed any doses of his Atripla. His only recent concern has been that he has had a little more stress related to his busy schedule with school and work. He states it has been difficult to "decompress" after work and this is caused him to have difficulty falling asleep and staying asleep. He recently was given some Xanax by Dr. Drue Novel and is taking it sparingly. It has helped him sleep better. He has also tried to maintain a specific routine after getting off of work to help him relax and he is trying to continue to go to the gym as frequently as possible. He has finished the fall semester of school and feels that that will help reduce his stress over the holidays. He denies feeling more depressed.    Review of Systems     Objective:   Physical Exam  Constitutional: He appears well-developed and well-nourished. No distress.  HENT:  Mouth/Throat: Oropharynx is clear and moist. No oropharyngeal exudate.       There is no obvious abnormality or around his left mandibular molar #19.  Eyes: Conjunctivae are normal.  Cardiovascular: Normal rate, regular rhythm and normal heart sounds.   No murmur heard. Pulmonary/Chest: Breath sounds normal. He has no wheezes. He has no rales.  Abdominal: Soft. Bowel sounds are normal. There is no tenderness.  Skin: No rash noted.  Psychiatric: He has a normal mood and affect.   HIV 1 RNA Quant (copies/mL)  Date Value  09/27/2011 34*  04/05/2011 <20   09/20/2010 156*     CD4 T Cell Abs (cmm)  Date Value  09/27/2011 410   04/05/2011 400     09/20/2010 390*            Assessment & Plan:

## 2011-10-06 NOTE — Assessment & Plan Note (Signed)
His infection remains under good control. I will continue his Atripla.

## 2012-01-05 ENCOUNTER — Telehealth: Payer: Self-pay | Admitting: *Deleted

## 2012-01-05 NOTE — Telephone Encounter (Signed)
Schedule a OV please  

## 2012-01-05 NOTE — Telephone Encounter (Signed)
Appt scheduled

## 2012-01-05 NOTE — Telephone Encounter (Signed)
[facility] Triage Call Report Triage Record Num: [id number] Operator: Durward Mallard [facility] Patient Name: Bruce Woods Call Date & Time: 01/05/2012 12:09:51PM Patient Phone: [phone] PCP: Nolon Rod. Paz Patient Gender: Male PCP Fax : Patient DOB: 1961-03-30 Practice Name: [facility] Reason for Call: Beverly/Spouse is calling and states that husband was in the office and had lab work drawn for low libido; testosterone levels were "borderline;" pt would like to have something called in to increase testosterone levels; Triaged per Medication Question-Adult Guideline; Call provider in 24 hr; OFFICE PLEASE CALL PT REGARDING STARTING HIM ON MEDICATION TO INCREASE TESTOTERONE LEVELS D/T LOW LIBIDO; requesting Andergel if possible; [facility] [phone]; please call pt back with further information [phone] Protocol(s) Used: Medication Questions - Adult Recommended Outcome per Protocol: Speak with Provider or Pharmacist within 24 hours Reason for Outcome: Requests refill of prescribed medication with valid refills; lack of medications does not put patient at clinical risk Care Advice: ~ 01/05/2012 12:17:29PM

## 2012-01-16 ENCOUNTER — Other Ambulatory Visit: Payer: Self-pay | Admitting: Internal Medicine

## 2012-01-18 ENCOUNTER — Ambulatory Visit: Payer: 59 | Admitting: Internal Medicine

## 2012-03-27 ENCOUNTER — Other Ambulatory Visit: Payer: 59

## 2012-03-27 DIAGNOSIS — B20 Human immunodeficiency virus [HIV] disease: Secondary | ICD-10-CM

## 2012-03-27 LAB — CBC
MCV: 90.1 fL (ref 78.0–100.0)
Platelets: 181 10*3/uL (ref 150–400)
RDW: 13.5 % (ref 11.5–15.5)
WBC: 6.7 10*3/uL (ref 4.0–10.5)

## 2012-03-27 LAB — COMPREHENSIVE METABOLIC PANEL
ALT: 21 U/L (ref 0–53)
AST: 20 U/L (ref 0–37)
Calcium: 8.8 mg/dL (ref 8.4–10.5)
Chloride: 106 mEq/L (ref 96–112)
Creat: 1.06 mg/dL (ref 0.50–1.35)
Sodium: 141 mEq/L (ref 135–145)

## 2012-03-27 LAB — LIPID PANEL
Total CHOL/HDL Ratio: 5.4 Ratio
VLDL: 39 mg/dL (ref 0–40)

## 2012-03-27 LAB — RPR

## 2012-03-28 LAB — HIV-1 RNA QUANT-NO REFLEX-BLD
HIV 1 RNA Quant: <20 copies/mL (ref <20)
HIV-1 RNA Quant, Log: <1.3 {Log} (ref <1.30)

## 2012-03-28 LAB — T-HELPER CELL (CD4) - (RCID CLINIC ONLY): CD4 T Cell Abs: 510 uL (ref 400–2700)

## 2012-04-10 ENCOUNTER — Ambulatory Visit (INDEPENDENT_AMBULATORY_CARE_PROVIDER_SITE_OTHER): Payer: 59 | Admitting: Internal Medicine

## 2012-04-10 ENCOUNTER — Encounter: Payer: Self-pay | Admitting: Internal Medicine

## 2012-04-10 VITALS — BP 154/83 | HR 65 | Temp 97.6°F | Wt 182.4 lb

## 2012-04-10 DIAGNOSIS — B20 Human immunodeficiency virus [HIV] disease: Secondary | ICD-10-CM

## 2012-04-10 NOTE — Progress Notes (Signed)
Patient ID: Bruce Woods, male   DOB: 04-30-1961, 51 y.o.   MRN: [id number]     [facility] for Infectious Disease  Patient Active Problem List  Diagnoses  . HIV DISEASE  . HERPES ZOSTER  . SARCOIDOSIS  . DEPRESSION  . Decreased libido    Patient's Medications  New Prescriptions   No medications on file  Previous Medications   ALPRAZOLAM (XANAX) 0.5 MG TABLET    Take 0.5 mg by mouth at bedtime as needed.     CITALOPRAM (CELEXA) 20 MG TABLET    TAKE ONE TABLET BY MOUTH ONE TIME DAILY   EFAVIRENZ-EMTRICTABINE-TENOFOVIR (ATRIPLA) 600-200-300 MG PER TABLET    Take 1 tablet by mouth at bedtime.   VITAMIN D, ERGOCALCIFEROL, (DRISDOL) 50000 UNITS CAPS    Take 1 capsule (50,000 Units total) by mouth every 7 (seven) days.  Modified Medications   No medications on file  Discontinued Medications   PENICILLIN V POTASSIUM (VEETID) 500 MG TABLET    Take 500 mg by mouth every 6 (six) hours.      Subjective: Bruce Woods is in for his routine visit. As usual he never misses a single dose of his Atripla. 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 He has been under stress since his oldest daughter has been diagnosed bipolar with some autism. They're currently working on getting her disability. He denies feeling actively depressed.  Objective: Temp: 97.6 F (36.4 C) (06/11 0857) Temp src: Oral (06/11 0857) BP: 154/83 mmHg (06/11 0857) Pulse Rate: 65  (06/11 0857)  General: Alert, no distress. Skin: No rash Lungs: Clear Cor: Regular S1-S2 no murmurs  Lab Results CD4 510 HIV viral load: <20   Assessment: Bruce Woods's HIV infection remains under excellent control. I will continue Atripla.  Plan: 1. Continue Atripla 2. Return after blood work in 6 months   Cliffton Asters, MD [facility] for Infectious Disease [facility] [phone] pager   [phone] cell 04/10/2012, 9:18 AM

## 2012-05-21 ENCOUNTER — Other Ambulatory Visit: Payer: Self-pay | Admitting: *Deleted

## 2012-05-21 DIAGNOSIS — B20 Human immunodeficiency virus [HIV] disease: Secondary | ICD-10-CM

## 2012-05-21 MED ORDER — EFAVIRENZ-EMTRICITAB-TENOFOVIR 600-200-300 MG PO TABS: 1.0000 | ORAL_TABLET | Freq: Every day | ORAL | Status: DC

## 2012-05-25 ENCOUNTER — Ambulatory Visit (INDEPENDENT_AMBULATORY_CARE_PROVIDER_SITE_OTHER): Payer: 59 | Admitting: Family Medicine

## 2012-05-25 ENCOUNTER — Encounter: Payer: Self-pay | Admitting: Family Medicine

## 2012-05-25 VITALS — BP 156/101 | HR 73 | Temp 97.9°F | Ht 68.0 in | Wt 177.0 lb

## 2012-05-25 DIAGNOSIS — M25551 Pain in right hip: Secondary | ICD-10-CM

## 2012-05-25 DIAGNOSIS — M25559 Pain in unspecified hip: Secondary | ICD-10-CM

## 2012-05-25 NOTE — Patient Instructions (Addendum)
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 Ice over area of pain 3-4 times a day for 15 minutes at a time Hip abduction exercise 3 x 15 once a day - add weights if this becomes too easy. Standing hip rotations 3 x 15 once a day also. Stretches - pick 2 and hold for 20-30 seconds x 3 - do once or twice a day. Medicines as you have been for pain. You were given a cortisone shot in trochanteric bursa today. If not improving would consider physical therapy or workup of your lumbar spine.

## 2012-05-25 NOTE — Assessment & Plan Note (Addendum)
2/2 ITBS with trochanteric bursitis.  Less likely lumbar radiculopathy.  Start home exercise program - demonstrated stretches and strengthening exercises and handouts provided.  Trochanteric bursa injection given as well.  Icing, nsaids/tylenol as needed for pain.  Also has vicodin from another MD.  Robaxin helps him some - can continue this as well.  F/u in 4-6 weeks for reevaluation.  Would consider formal PT and/or lumbar spine workup depending on his exam and improvement.  After informed written consent patient was lying on left side on exam table.  Area overlying right greater trochanter maximal area of pain prepped with iodine and alcohol swab.  Then trochanteric bursa injected with 6:2 marcaine: depomedrol.  Patient tolerated procedure well without any immediate complications.

## 2012-05-25 NOTE — Progress Notes (Signed)
Subjective:    Patient ID: NYQUAN SELBE, male    DOB: 06-23-1961, 51 y.o.   MRN: [id number]  PCP: Dr. Drue Novel  HPI 51 yo M here for right hip pain.  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 + bruising and swelling, difficulty walking after this. Saw an orthopedist - dx with traumatic trochanteric bursitis - had injection and several years of relief. Then symptoms started to recur several months ago in same area. Worse with prolonged standing (works 12 hour shifts), walking and running, stairs. Feels better with robaxin, hot tub, vicodin. Also worse with driving. Had injection a few months ago that only lasted about a month this time. Has never done stretches, exercises, PT for this. Had MRI of hip remotely that was negative. Has also tried ibuprofen. Gets lateral pain from hip to knee. Some burning associated with this. No bowel/bladder dysfunction. No numbness. Some back pain.  Past Medical History  Diagnosis Date  . DEPRESSION   . HIV DISEASE   . Sarcoidosis 2000    question of    Current Outpatient Prescriptions on File Prior to Visit  Medication Sig Dispense Refill  . citalopram (CELEXA) 20 MG tablet TAKE ONE TABLET BY MOUTH ONE TIME DAILY  90 tablet  2  . efavirenz-emtricitabine-tenofovir (ATRIPLA) 600-200-300 MG per tablet Take 1 tablet by mouth at bedtime.  90 tablet  3    Past Surgical History  Procedure Date  . Lung surgery 2000    vats  . Tonsillectomy and adenoidectomy     Allergies  Allergen Reactions  . Celebrex (Celecoxib) Nausea Only  . Doxycycline   . Morphine     History   Social History  . Marital Status: Married    Spouse Name: N/A    Number of Children: N/A  . Years of Education: N/A   Occupational History  . Not on file.   Social History Main Topics  . Smoking status: Never Smoker   . Smokeless tobacco: Never Used  . Alcohol Use: 0.5 oz/week    1 drink(s) per  week  . Drug Use: No  . Sexually Active: Yes     declined condoms   Other Topics Concern  . Not on file   Social History Narrative  . No narrative on file    Family History  Problem Relation Age of Onset  . Hyperlipidemia Mother   . Hypertension Mother   . Sudden death Neg Hx   . Heart attack Neg Hx   . Diabetes Neg Hx     BP 156/101  Pulse 73  Temp 97.9 F (36.6 C) (Oral)  Ht 5\' 8"  (1.727 m)  Wt 177 lb (80.287 kg)  BMI 26.91 kg/m2  Review of Systems See HPI above.    Objective:   Physical Exam Gen: NAD  Back/R hip No gross deformity, scoliosis. No leg length inequality - both measure 88.5cm TTP right greater trochanter.  Minimal right paraspinal lumbar TTP.  No midline or bony TTP.  No other TTP about back or hip. FROM back without pain.  FROM hip without pain (only pain at trochanter, not in groin). Strength LEs 5/5 all muscle groups including abduction.   3+ MSRs in patellar and 2+ achilles tendons, equal bilaterally. Negative SLRs. Sensation intact to light touch bilaterally. Negative logroll bilateral hips Negative fabers and piriformis stretches - pain at trochanter, not at SI joint or at piriformis.  Assessment & Plan:  1. Right hip pain - 2/2 ITBS with trochanteric bursitis.  Less likely lumbar radiculopathy.  Start home exercise program - demonstrated stretches and strengthening exercises and handouts provided.  Trochanteric bursa injection given as well.  Icing, nsaids/tylenol as needed for pain.  Also has vicodin from another MD.  Robaxin helps him some - can continue this as well.  F/u in 4-6 weeks for reevaluation.  Would consider formal PT and/or lumbar spine workup depending on his exam and improvement.  After informed written consent patient was lying on left side on exam table.  Area overlying right greater trochanter maximal area of pain prepped with iodine and alcohol swab.  Then trochanteric bursa injected with 6:2 marcaine: depomedrol.   Patient tolerated procedure well without any immediate complications.

## 2012-06-21 ENCOUNTER — Ambulatory Visit: Payer: 59 | Admitting: Internal Medicine

## 2012-09-17 ENCOUNTER — Other Ambulatory Visit: Payer: 59

## 2012-10-02 ENCOUNTER — Other Ambulatory Visit: Payer: 59

## 2012-10-02 DIAGNOSIS — B20 Human immunodeficiency virus [HIV] disease: Secondary | ICD-10-CM

## 2012-10-11 ENCOUNTER — Ambulatory Visit (INDEPENDENT_AMBULATORY_CARE_PROVIDER_SITE_OTHER): Payer: 59 | Admitting: Internal Medicine

## 2012-10-11 ENCOUNTER — Encounter: Payer: Self-pay | Admitting: Internal Medicine

## 2012-10-11 VITALS — BP 161/95 | HR 73 | Temp 97.9°F | Ht 68.0 in | Wt 178.5 lb

## 2012-10-11 DIAGNOSIS — B20 Human immunodeficiency virus [HIV] disease: Secondary | ICD-10-CM

## 2012-10-11 NOTE — Progress Notes (Signed)
Patient ID: Bruce Woods, male   DOB: [birth year], 51 y.o.   MRN: [id number]     [facility] for Infectious Disease  Patient Active Problem List  Diagnosis  . HIV DISEASE  . HERPES ZOSTER  . SARCOIDOSIS  . DEPRESSION  . Decreased libido  . Right hip pain    Patient's Medications  New Prescriptions   No medications on file  Previous Medications   CITALOPRAM (CELEXA) 20 MG TABLET    TAKE ONE TABLET BY MOUTH ONE TIME DAILY   EFAVIRENZ-EMTRICITABINE-TENOFOVIR (ATRIPLA) 600-200-300 MG PER TABLET    Take 1 tablet by mouth at bedtime.   HYDROCODONE-ACETAMINOPHEN (VICODIN) 5-500 MG PER TABLET    Take 1 tablet by mouth every 6 (six) hours as needed.   METHOCARBAMOL (ROBAXIN) 500 MG TABLET    Take 500 mg by mouth 4 (four) times daily.  Modified Medications   No medications on file  Discontinued Medications   No medications on file    Subjective: Bruce Woods is in for his routine visit. As usual, he never misses a single dose of his Atripla. He is considering having a job at [employer]. Would be less than a mile from home and we'll give him regular daytime hours. He has only 3 classes left to finish up his school work. He has had his influenza vaccine.   Objective: Temp: 97.9 F (36.6 C) (12/12 0934) Temp src: Oral (12/12 0934) BP: 161/95 mmHg (12/12 0934) Pulse Rate: 73  (12/12 0934)  General: He is in good spirits Skin: No rash Lungs: Clear Cor: Regular S1 and S2 no murmurs     Assessment: Bruce Woods viral load is just barely detectable at 32 but his CD4 count remains normal at 450. I will continue his current regimen.  Plan: 1. Continue Atripla 2. Followup after lab work in 6 months   Bruce Asters, MD [facility] for Infectious Disease [facility] [phone] pager   [phone] cell 10/11/2012, 9:52 AM

## 2012-10-12 ENCOUNTER — Other Ambulatory Visit: Payer: Self-pay | Admitting: Internal Medicine

## 2012-10-16 ENCOUNTER — Ambulatory Visit: Payer: 59 | Admitting: Internal Medicine

## 2012-11-09 ENCOUNTER — Other Ambulatory Visit: Payer: Self-pay | Admitting: Internal Medicine

## 2012-11-22 ENCOUNTER — Encounter: Payer: Self-pay | Admitting: *Deleted

## 2012-11-22 ENCOUNTER — Ambulatory Visit: Payer: 59 | Admitting: Internal Medicine

## 2012-11-22 ENCOUNTER — Encounter: Payer: Self-pay | Admitting: Internal Medicine

## 2012-11-22 VITALS — BP 132/84 | HR 72 | Temp 97.9°F | Wt 180.0 lb

## 2012-11-22 DIAGNOSIS — J329 Chronic sinusitis, unspecified: Secondary | ICD-10-CM

## 2012-11-22 MED ORDER — AMOXICILLIN 500 MG PO CAPS: 1000.0000 mg | ORAL_CAPSULE | Freq: Two times a day (BID) | ORAL | Status: DC

## 2012-11-22 NOTE — Patient Instructions (Addendum)
Rest, fluids , tylenol For cough, take NyQuil or DayQuil as needed  For congestion use Nasonex 2 sprays in each side of the nose until the sample is gone Take the antibiotic as prescribed  (Amoxicillin) Call if no better in few days Call anytime if the symptoms are severe

## 2012-11-22 NOTE — Progress Notes (Signed)
  Subjective:    Patient ID: Bruce Woods, male    DOB: 1961-01-23, 52 y.o.   MRN: [id number]  HPI Acute visit Symptoms started 3 days ago with sore throat for one day, followup by nocturnal fever and chills. In the last 24 hours he had severe facial pain, nasal discharge fatigue.  Past Medical History  Diagnosis Date  . DEPRESSION   . HIV DISEASE   . Sarcoidosis 2000    question of   Past Surgical History  Procedure Date  . Lung surgery 2000    vats  . Tonsillectomy and adenoidectomy      Review of Systems No nausea, vomiting, diarrhea, good by mouth tolerance. Mild cough, denies chest congestion. 3 family members are affected with similar symptoms.     Objective:   Physical Exam General -- alert, well-developed, and well-nourished.   HEENT -- TMs normal, throat w/o redness, face symmetric and slt  tender to palpation, nose is congested Lungs -- normal respiratory effort, no intercostal retractions, no accessory muscle use, and normal breath sounds.   Heart-- normal rate, regular rhythm, no murmur, and no gallop.   Neurologic-- alert & oriented X3 and strength normal in all extremities. Psych-- Cognition and judgment appear intact. Alert and cooperative with normal attention span and concentration.  not anxious appearing and not depressed appearing.       Assessment & Plan:   Viral syndrome now with symptoms consistent with sinusitis. See instructions

## 2013-04-09 ENCOUNTER — Other Ambulatory Visit (INDEPENDENT_AMBULATORY_CARE_PROVIDER_SITE_OTHER): Payer: 59

## 2013-04-09 DIAGNOSIS — B20 Human immunodeficiency virus [HIV] disease: Secondary | ICD-10-CM

## 2013-04-09 LAB — COMPREHENSIVE METABOLIC PANEL
AST: 16 U/L (ref 0–37)
Albumin: 4.4 g/dL (ref 3.5–5.2)
BUN: 17 mg/dL (ref 6–23)
CO2: 28 mEq/L (ref 19–32)
Calcium: 9.5 mg/dL (ref 8.4–10.5)
Chloride: 105 mEq/L (ref 96–112)
Creat: 1.07 mg/dL (ref 0.50–1.35)
Glucose, Bld: 86 mg/dL (ref 70–99)
Potassium: 4.4 mEq/L (ref 3.5–5.3)

## 2013-04-09 LAB — LIPID PANEL
Cholesterol: 130 mg/dL (ref 0–200)
HDL: 28 mg/dL — ABNORMAL LOW (ref >39)
Total CHOL/HDL Ratio: 4.6 Ratio

## 2013-04-09 LAB — RPR

## 2013-04-09 LAB — CBC
HCT: 45.2 % (ref 39.0–52.0)
Hemoglobin: 16.4 g/dL (ref 13.0–17.0)
MCV: 89.9 fL (ref 78.0–100.0)
RBC: 5.03 MIL/uL (ref 4.22–5.81)
WBC: 6 10*3/uL (ref 4.0–10.5)

## 2013-04-10 LAB — HIV-1 RNA QUANT-NO REFLEX-BLD: HIV-1 RNA Quant, Log: <1.3 {Log} (ref <1.30)

## 2013-04-23 ENCOUNTER — Encounter: Payer: Self-pay | Admitting: Internal Medicine

## 2013-04-23 ENCOUNTER — Ambulatory Visit (INDEPENDENT_AMBULATORY_CARE_PROVIDER_SITE_OTHER): Payer: 59 | Admitting: Internal Medicine

## 2013-04-23 VITALS — BP 156/89 | HR 67 | Temp 97.6°F | Ht 68.0 in | Wt 180.0 lb

## 2013-04-23 DIAGNOSIS — B20 Human immunodeficiency virus [HIV] disease: Secondary | ICD-10-CM

## 2013-04-23 NOTE — Progress Notes (Signed)
Patient ID: Bruce Woods, male   DOB: [birth year], 52 y.o.   MRN: [id number]          [facility] for Infectious Disease  Patient Active Problem List   Diagnosis Date Noted  . DEPRESSION 01/03/2007    Priority: High  . HIV DISEASE 08/18/2006    Priority: High  . Right hip pain 05/25/2012  . Decreased libido 08/19/2011  . 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 SARCOIDOSIS 08/18/2006    Patient's Medications  New Prescriptions   No medications on file  Previous Medications   CITALOPRAM (CELEXA) 20 MG TABLET    TAKE ONE TABLET BY MOUTH ONE TIME DAILY   EFAVIRENZ-EMTRICITABINE-TENOFOVIR (ATRIPLA) 600-200-300 MG PER TABLET    Take 1 tablet by mouth at bedtime.   METHOCARBAMOL (ROBAXIN) 500 MG TABLET    Take 500 mg by mouth as needed.  Modified Medications   No medications on file  Discontinued Medications   AMOXICILLIN (AMOXIL) 500 MG CAPSULE    Take 2 capsules (1,000 mg total) by mouth 2 (two) times daily.    Subjective: Sabre is in for his routine visit. He never misses a single dose of his Atripla. He is doing well with the exception of grieving over the recent death of his mother. She developed some speech difficulties and underwent a CT scan which eventually revealed and glioblastoma. She died under hospice care about 3 weeks later. He is hoping to take some time off from work and is in the process of cleaning her home and [facility] in preparation for selling it later this summer. He says he does not feel depressed and he is sleeping well.  Review of Systems: Pertinent items are noted in HPI.  Past Medical History  Diagnosis Date  . DEPRESSION   . HIV DISEASE   . Sarcoidosis 2000    question of    History  Substance Use Topics  . Smoking status: Never Smoker   . Smokeless tobacco: Never Used  . Alcohol Use: 0.5 oz/week    1 drink(s) per week    Family History  Problem Relation Age of Onset  . Hyperlipidemia Mother   . Hypertension Mother   . Sudden death  Neg Hx   . Heart attack Neg Hx   . Diabetes Neg Hx     Allergies  Allergen Reactions  . Celebrex (Celecoxib) Nausea Only  . Doxycycline   . Morphine     Objective: Temp: 97.6 F (36.4 C) (06/24 0850) Temp src: Oral (06/24 0850) BP: 156/89 mmHg (06/24 0850) Pulse Rate: 67 (06/24 0850)  General: He is in good spirits Oral: No oropharyngeal lesions Skin: No rash Lungs: Clear Cor: Regular S1 and S2 no murmurs Mood and affect: Normal  Lab Results CD4 count 04/09/2013: 320 HIV viral load 04/09/2013: Less than 20   Assessment: His HIV infection his back under excellent control with an undetectable viral load. He is grieving over the recent death is up his mother but does not feel that he needs any additional counseling or assistance at this time.  Plan: 1. Continue Atripla 2. Followup after lab work in 6 months   Cliffton Asters, MD [facility] for Infectious Disease [facility] [phone] pager   [phone] cell 04/23/2013, 9:19 AM

## 2013-04-30 NOTE — Addendum Note (Signed)
Addended by: Jennet Maduro D on: 04/30/2013 08:40 AM   Modules accepted: Orders

## 2013-05-29 ENCOUNTER — Other Ambulatory Visit: Payer: Self-pay | Admitting: Internal Medicine

## 2013-06-07 ENCOUNTER — Other Ambulatory Visit: Payer: Self-pay | Admitting: *Deleted

## 2013-06-07 DIAGNOSIS — B20 Human immunodeficiency virus [HIV] disease: Secondary | ICD-10-CM

## 2013-06-07 MED ORDER — EFAVIRENZ-EMTRICITAB-TENOFOVIR 600-200-300 MG PO TABS: ORAL_TABLET | ORAL | Status: DC

## 2013-07-17 ENCOUNTER — Other Ambulatory Visit: Payer: Self-pay | Admitting: Dermatology

## 2013-08-26 ENCOUNTER — Other Ambulatory Visit: Payer: Self-pay | Admitting: Internal Medicine

## 2013-09-02 ENCOUNTER — Encounter: Payer: Self-pay | Admitting: Family Medicine

## 2013-09-02 ENCOUNTER — Ambulatory Visit (INDEPENDENT_AMBULATORY_CARE_PROVIDER_SITE_OTHER): Payer: 59 | Admitting: Family Medicine

## 2013-09-02 VITALS — BP 128/90 | HR 70 | Ht 68.0 in | Wt 178.0 lb

## 2013-09-02 DIAGNOSIS — M25551 Pain in right hip: Secondary | ICD-10-CM

## 2013-09-02 DIAGNOSIS — M25559 Pain in unspecified hip: Secondary | ICD-10-CM

## 2013-09-02 NOTE — Patient Instructions (Signed)
Do home exercises 3 sets of 10 once a day (hip side raise, standing hip rotation). Can add ankle weight if these become too easy. Pick 3-4 of the stretches - hold for 20-30 seconds, repeat 3 times once a day. You were given a cortisone shot for this today. Icing 15 minutes at a time as needed. Continue robaxin. Consider formal physical therapy if not improving. Follow up with me in 6 weeks or as needed.

## 2013-09-03 ENCOUNTER — Encounter: Payer: Self-pay | Admitting: Family Medicine

## 2013-09-03 NOTE — Assessment & Plan Note (Signed)
Right trochanteric bursitis - Discussed options and he would like to try repeat injection which was given today. Home exercises and stretches reviewed to do daily.  Icing.  Consider formal PT if not improving.   After informed written consent patient was lying on left side on exam table. Area overlying right greater trochanter prepped with alcohol swab then trochanteric bursa injected with 6:2 marcaine: depomedrol. Patient tolerated procedure well without immediate complications.

## 2013-09-03 NOTE — Progress Notes (Signed)
Patient ID: Bruce Woods, male   DOB: [birth year], 52 y.o.   MRN: [id number]  PCP: Willow Ora, MD  Subjective:   HPI: Patient is a 52 y.o. male here for right hip pain.  Patient was seen a year and a half ago for same issue. Has had two injections for bursitis - first one lasted over 5 years and this one lasted until last week. Started getting worse following working two 16 hour days Wednesday and Thursday. Friday pain was severe. Pain radiating lateral hip down to knee. Took robaxin, vicodin. No back pain.  Past Medical History  Diagnosis Date  . DEPRESSION   . HIV DISEASE   . Sarcoidosis 2000    question of    Current Outpatient Prescriptions on File Prior to Visit  Medication Sig Dispense Refill  . citalopram (CELEXA) 20 MG tablet TAKE 1 TABLET BY MOUTH ONCE DAILY  90 tablet  1  . efavirenz-emtricitabine-tenofovir (ATRIPLA) 600-200-300 MG per tablet TAKE 1 TABLET AT BEDTIME  30 tablet  2  . methocarbamol (ROBAXIN) 500 MG tablet Take 500 mg by mouth as needed.       No current facility-administered medications on file prior to visit.    Past Surgical History  Procedure Laterality Date  . Lung surgery  2000    vats  . Tonsillectomy and adenoidectomy      Allergies  Allergen Reactions  . Celebrex [Celecoxib] Nausea Only  . Doxycycline   . Morphine     History   Social History  . Marital Status: Married    Spouse Name: N/A    Number of Children: N/A  . Years of Education: N/A   Occupational History  . Not on file.   Social History Main Topics  . Smoking status: Never Smoker   . Smokeless tobacco: Never Used  . Alcohol Use: 0.5 oz/week    1 drink(s) per week  . Drug Use: No  . Sexual Activity: Yes     Comment: declined condoms   Other Topics Concern  . Not on file   Social History Narrative  . No narrative on file    Family History  Problem Relation Age of Onset  . Hyperlipidemia Mother   . Hypertension Mother   . Sudden death Neg Hx   .  Heart attack Neg Hx   . Diabetes Neg Hx     BP 128/90  Pulse 70  Ht 5\' 8"  (1.727 m)  Wt 178 lb (80.74 kg)  BMI 27.07 kg/m2  Review of Systems: See HPI above.    Objective:  Physical Exam:  Gen: NAD  Right hip/back: No gross deformity, scoliosis. TTP severely trochanteric bursa.  No back paraspinal, midline or bony TTP. FROM with pain and 5-/5 strength hip abduction. Strength LEs 5/5 all muscle groups.   2+ MSRs in patellar and achilles tendons, equal bilaterally. Negative SLRs. Sensation intact to light touch bilaterally. Negative logroll bilateral hips Pain laterally with right fabers and piriformis stretches.    Assessment & Plan:  1. Right trochanteric bursitis - Discussed options and he would like to try repeat injection which was given today. Home exercises and stretches reviewed to do daily.  Icing.  Consider formal PT if not improving.   After informed written consent patient was lying on left side on exam table. Area overlying right greater trochanter prepped with alcohol swab then trochanteric bursa injected with 6:2 marcaine: depomedrol. Patient tolerated procedure well without immediate complications.

## 2013-09-30 ENCOUNTER — Telehealth: Payer: Self-pay

## 2013-09-30 NOTE — Telephone Encounter (Signed)
Left message for call back Non identifiable  

## 2013-10-01 ENCOUNTER — Encounter: Payer: Self-pay | Admitting: Internal Medicine

## 2013-10-01 ENCOUNTER — Ambulatory Visit (INDEPENDENT_AMBULATORY_CARE_PROVIDER_SITE_OTHER): Payer: 59 | Admitting: Internal Medicine

## 2013-10-01 VITALS — BP 119/82 | HR 68 | Temp 97.9°F | Ht 68.2 in | Wt 176.0 lb

## 2013-10-01 DIAGNOSIS — R5381 Other malaise: Secondary | ICD-10-CM

## 2013-10-01 DIAGNOSIS — R6882 Decreased libido: Secondary | ICD-10-CM

## 2013-10-01 DIAGNOSIS — E559 Vitamin D deficiency, unspecified: Secondary | ICD-10-CM

## 2013-10-01 DIAGNOSIS — Z0000 Encounter for general adult medical examination without abnormal findings: Secondary | ICD-10-CM

## 2013-10-01 DIAGNOSIS — R5383 Other fatigue: Secondary | ICD-10-CM

## 2013-10-01 LAB — LIPID PANEL
Cholesterol: 151 mg/dL (ref 0–200)
HDL: 30.7 mg/dL — ABNORMAL LOW (ref >39.00)
VLDL: 19.2 mg/dL (ref 0.0–40.0)

## 2013-10-01 LAB — FOLATE: Folate: 8.2 ng/mL (ref >5.9)

## 2013-10-01 LAB — PSA: PSA: 0.47 ng/mL (ref 0.10–4.00)

## 2013-10-01 LAB — TSH: TSH: 1.8 u[IU]/mL (ref 0.35–5.50)

## 2013-10-01 LAB — VITAMIN B12: Vitamin B-12: 232 pg/mL (ref 211–911)

## 2013-10-01 MED ORDER — BUPROPION HCL ER (XL) 150 MG PO TB24: 150.0000 mg | ORAL_TABLET | Freq: Every day | ORAL | Status: DC

## 2013-10-01 NOTE — Assessment & Plan Note (Addendum)
See previous  entry, continue with fatigue and decreased libido, insomnia not an  issue at this point. No symptoms of sleep apnea. Previously vitamin D was low Plan: Check a vitamin D, B12. Free testosterone. Add Wellbutrin, watch for s/e , decreased libido may be due to SSRIs. Avoid  changing citalopram for now  since is working really well for him. RTC 6 weeks

## 2013-10-01 NOTE — Assessment & Plan Note (Addendum)
Immunizations-- at work and per ID DRE normal, check a PSA cscope normal at age 52 Bruce Woods) Has a healthy life style

## 2013-10-01 NOTE — Progress Notes (Signed)
   Subjective:    Patient ID: TAJH LIVSEY, male    DOB: [birth year], 52 y.o.   MRN: [id number]  HPI CPX Ongoing issues with decreased libido and severe fatigue. Denies snoring, anxiety and depression under excellent control.  Past Medical History  Diagnosis Date  . DEPRESSION   . HIV DISEASE   . Sarcoidosis 2000    question of      Past Surgical History  Procedure Laterality Date  . Lung surgery  2000    vats  . Tonsillectomy and adenoidectomy        Family History  Problem Relation Age of Onset  . Hyperlipidemia Mother   . Hypertension Mother   . Sudden death Neg Hx   . Heart attack Neg Hx   . Diabetes Neg Hx   . Cancer Mother     glioblastoma   History   Social History  . Marital Status: Married    Spouse Name: N/A    Number of Children: 2  . Years of Education: N/A   Occupational History  . 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 Smoking status: Never Smoker   . Smokeless tobacco: Never Used  . Alcohol Use: 0.5 oz/week    1 drink(s) per week     Comment: socially   . Drug Use: No  . Sexual Activity: Yes     Comment: declined condoms   Other Topics Concern  . Not on file   Social History Narrative  . No narrative on file    Review of Systems Diet-- healthy most of the time  Exercise-- trying to exercise more, x 2/week No  CP, SOB Denies  nausea, vomiting diarrhea Denies  blood in the stools No GERD  Sx. No dysphagia or odynophagia No dysuria, gross hematuria, difficulty urinating   No anxiety, depression         Objective:   Physical Exam BP 119/82  Pulse 68  Temp(Src) 97.9 F (36.6 C)  Ht 5' 8.2" (1.732 m)  Wt 176 lb (79.833 kg)  BMI 26.61 kg/m2  SpO2 99% General -- alert, well-developed, NAD.  Neck --no thyromegaly Lungs -- normal respiratory effort, no intercostal retractions, no accessory muscle use, and normal breath sounds.  Heart-- normal rate, regular rhythm, no murmur.  Abdomen-- Not distended, good  bowel sounds,soft, non-tender. Rectal--  prostate gland firm and smooth, no enlargement, nodularity, tenderness, mass, asymmetry or induration. Extremities-- no pretibial edema bilaterally  Neurologic--  alert & oriented X3. Speech normal, gait normal, strength normal in all extremities.  Psych-- Cognition and judgment appear intact. Cooperative with normal attention span and concentration. No anxious appearing , no depressed appearing.      Assessment & Plan:

## 2013-10-01 NOTE — Patient Instructions (Addendum)
Get your blood work before you leave  Next visit for a   follow up   depression , no fasting, in 6 weeks  Please make an appointment    Start Wellbutrin, watch for side effects

## 2013-10-01 NOTE — Progress Notes (Signed)
Pre visit review using our clinic review tool, if applicable. No additional management support is needed unless otherwise documented below in the visit note. 

## 2013-10-01 NOTE — Assessment & Plan Note (Signed)
See "fatigue" 

## 2013-10-02 LAB — TESTOSTERONE, FREE, TOTAL, SHBG
Sex Hormone Binding: 45 nmol/L (ref 13–71)
Testosterone-% Free: 1.6 % (ref 1.6–2.9)
Testosterone: 352 ng/dL (ref 300–890)

## 2013-10-02 LAB — VITAMIN D 25 HYDROXY (VIT D DEFICIENCY, FRACTURES): Vit D, 25-Hydroxy: 17 ng/mL — ABNORMAL LOW (ref 30–89)

## 2013-10-02 NOTE — Telephone Encounter (Signed)
Unable to reach pre visit.  

## 2013-10-04 MED ORDER — VITAMIN D (ERGOCALCIFEROL) 1.25 MG (50000 UNIT) PO CAPS: 50000.0000 [IU] | ORAL_CAPSULE | ORAL | Status: DC

## 2013-10-04 NOTE — Addendum Note (Signed)
Addended by: Eustace Quail on: 10/04/2013 01:47 PM   Modules accepted: Orders

## 2013-10-15 ENCOUNTER — Other Ambulatory Visit: Payer: 59

## 2013-10-29 ENCOUNTER — Ambulatory Visit: Payer: 59 | Admitting: Internal Medicine

## 2013-11-07 ENCOUNTER — Telehealth: Payer: Self-pay | Admitting: *Deleted

## 2013-11-07 ENCOUNTER — Other Ambulatory Visit: Payer: 59

## 2013-11-07 DIAGNOSIS — B20 Human immunodeficiency virus [HIV] disease: Secondary | ICD-10-CM

## 2013-11-07 LAB — CBC
HCT: 46.2 % (ref 39.0–52.0)
Hemoglobin: 17 g/dL (ref 13.0–17.0)
MCH: 33.5 pg (ref 26.0–34.0)
MCHC: 36.8 g/dL — AB (ref 30.0–36.0)
MCV: 90.9 fL (ref 78.0–100.0)
PLATELETS: 194 10*3/uL (ref 150–400)
RBC: 5.08 MIL/uL (ref 4.22–5.81)
RDW: 14 % (ref 11.5–15.5)
WBC: 7.7 10*3/uL (ref 4.0–10.5)

## 2013-11-07 NOTE — Telephone Encounter (Signed)
Patient came in for labs and is c/o sinus drainage and congestion and requesting a z-pack be called to his pharmacy. Explained to patient that he would need to be seen and offered an appt. He declined and said he would call his PCP. Myrtis Hopping

## 2013-11-08 LAB — LIPID PANEL
Cholesterol: 136 mg/dL (ref 0–200)
HDL: 30 mg/dL — ABNORMAL LOW (ref >39)
LDL Cholesterol: 80 mg/dL (ref 0–99)
Total CHOL/HDL Ratio: 4.5 Ratio
Triglycerides: 129 mg/dL (ref <150)
VLDL: 26 mg/dL (ref 0–40)

## 2013-11-08 LAB — HIV-1 RNA QUANT-NO REFLEX-BLD
HIV 1 RNA Quant: 40 {copies}/mL — ABNORMAL HIGH (ref <20)
HIV-1 RNA Quant, Log: 1.6 {Log} — ABNORMAL HIGH (ref <1.30)

## 2013-11-08 LAB — COMPREHENSIVE METABOLIC PANEL
ALK PHOS: 82 U/L (ref 39–117)
ALT: 22 U/L (ref 0–53)
AST: 19 U/L (ref 0–37)
Albumin: 4.5 g/dL (ref 3.5–5.2)
BILIRUBIN TOTAL: 0.6 mg/dL (ref 0.3–1.2)
BUN: 13 mg/dL (ref 6–23)
CO2: 29 meq/L (ref 19–32)
CREATININE: 0.95 mg/dL (ref 0.50–1.35)
Calcium: 9.4 mg/dL (ref 8.4–10.5)
Chloride: 101 mEq/L (ref 96–112)
Glucose, Bld: 107 mg/dL — ABNORMAL HIGH (ref 70–99)
Potassium: 4 mEq/L (ref 3.5–5.3)
Sodium: 138 mEq/L (ref 135–145)
TOTAL PROTEIN: 7.1 g/dL (ref 6.0–8.3)

## 2013-11-08 LAB — RPR

## 2013-11-11 LAB — T-HELPER CELL (CD4) - (RCID CLINIC ONLY)
CD4 T CELL HELPER: 23 % — AB (ref 33–55)
CD4 T Cell Abs: 410 /uL (ref 400–2700)

## 2013-11-12 ENCOUNTER — Ambulatory Visit (INDEPENDENT_AMBULATORY_CARE_PROVIDER_SITE_OTHER): Payer: 59 | Admitting: Internal Medicine

## 2013-11-12 ENCOUNTER — Encounter: Payer: Self-pay | Admitting: Internal Medicine

## 2013-11-12 VITALS — BP 136/89 | HR 90 | Temp 98.0°F | Wt 178.0 lb

## 2013-11-12 DIAGNOSIS — F3289 Other specified depressive episodes: Secondary | ICD-10-CM

## 2013-11-12 DIAGNOSIS — N529 Male erectile dysfunction, unspecified: Secondary | ICD-10-CM

## 2013-11-12 DIAGNOSIS — R5381 Other malaise: Secondary | ICD-10-CM

## 2013-11-12 DIAGNOSIS — R5383 Other fatigue: Secondary | ICD-10-CM

## 2013-11-12 DIAGNOSIS — F329 Major depressive disorder, single episode, unspecified: Secondary | ICD-10-CM

## 2013-11-12 MED ORDER — TADALAFIL 20 MG PO TABS: 10.0000 mg | ORAL_TABLET | ORAL | Status: DC | PRN

## 2013-11-12 NOTE — Progress Notes (Signed)
   Subjective:    Patient ID: Bruce Woods, male    DOB: 12-Oct-1961, 53 y.o.   MRN: [id number]  HPI Followup from previous visit. Since the last time he was here, labs show a very low vitamin D, he's taking 50,000 units weekly plus daily OTC supplements. Energy level has returned to previous levels, feeling well. Still has a slt decreased libido and unsatisfactory erections. As far as depression, he took Wellbutrin one time, shortly after developed a generalized rash so he  quit taking that. His mood currently is well-controlled.  Past Medical History  Diagnosis Date  . DEPRESSION   . HIV DISEASE   . Sarcoidosis 2000    question of   Past Surgical History  Procedure Laterality Date  . Lung surgery  2000    vats  . Tonsillectomy and adenoidectomy     History   Social History  . Marital Status: Married    Spouse Name: N/A    Number of Children: 2  . Years of Education: N/A   Occupational History  . 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 Smoking status: Never Smoker   . Smokeless tobacco: Never Used  . Alcohol Use: 0.5 oz/week    1 drink(s) per week     Comment: socially   . Drug Use: No  . Sexual Activity: Yes     Comment: declined condoms   Other Topics Concern  . Not on file   Social History Narrative  . No narrative on file     Review of Systems     Objective:   Physical Exam BP 136/89  Pulse 90  Temp(Src) 98 F (36.7 C)  Wt 178 lb (80.74 kg)  SpO2 96% General -- alert, well-developed, NAD.   Psych-- Cognition and judgment appear intact. Cooperative with normal attention span and concentration. No anxious or depressed appearing.      Assessment & Plan:  Today , I spent more than 15 min with the patient, >50% of the time counseling about ED

## 2013-11-12 NOTE — Assessment & Plan Note (Signed)
Currently well controlled, he had a rash with Wellbutrin.

## 2013-11-12 NOTE — Progress Notes (Signed)
Pre visit review using our clinic review tool, if applicable. No additional management support is needed unless otherwise documented below in the visit note. 

## 2013-11-12 NOTE — Patient Instructions (Signed)
Next visit is for a physical exam in December 2015,  fasting Please make an appointment

## 2013-11-12 NOTE — Assessment & Plan Note (Signed)
At this point his main concern is not decrease libido but ED, has erections but they don't last enough to penetrate. We discussed  Viagra, Cialis, how to use those medications and side effects. cialis samples provided, prescription sent to pharmacy. He will let me know if he has side effects or at the medication is not effective

## 2013-11-12 NOTE — Assessment & Plan Note (Signed)
We found he had a very low vitamin D, he is taking high doses of vitamin D and vitamin D daily. He feels great. Plan is to finish ergocalciferol and continue with vitamin D daily supplements, recheck labs on return to the office

## 2013-11-14 ENCOUNTER — Encounter: Payer: Self-pay | Admitting: Emergency Medicine

## 2013-11-14 ENCOUNTER — Emergency Department
Admission: EM | Admit: 2013-11-14 | Discharge: 2013-11-14 | Disposition: A | Discharge status: Home / Self Care | Payer: 59 | Source: Home / Self Care | Attending: Family Medicine | Admitting: Family Medicine

## 2013-11-14 DIAGNOSIS — J069 Acute upper respiratory infection, unspecified: Secondary | ICD-10-CM

## 2013-11-14 MED ORDER — OSELTAMIVIR PHOSPHATE 75 MG PO CAPS: 75.0000 mg | ORAL_CAPSULE | Freq: Two times a day (BID) | ORAL | Status: DC

## 2013-11-14 MED ORDER — AZITHROMYCIN 250 MG PO TABS: ORAL_TABLET | ORAL | Status: DC

## 2013-11-14 NOTE — ED Provider Notes (Signed)
CSN: [id number]     Arrival date & time 11/14/13  1126 History   First MD Initiated Contact with Patient 11/14/13 1155     Chief Complaint  Patient presents with  . Otalgia  . Headache  . Cough    HPI  URI Symptoms Onset: 3-4 days  Description: rhinorrhea, nasal congestion, cough, nasal drainage  Modifying factors:  HIV +, viral loads have been near non detectable per pt.   Symptoms Nasal discharge: yes Fever: no Sore throat: no Cough: yes Wheezing: no Ear pain: no GI symptoms: no Sick contacts: yes; works as Marine scientist at [employer]  Stiff neck: no Dyspnea: no Rash: no Swallowing difficulty: no  Sinusitis Risk Factors Headache/face pain: no Double sickening: no tooth pain: no  Allergy Risk Factors Sneezing: no Itchy scratchy throat: no Seasonal symptoms: no  Flu Risk Factors Headache: no muscle aches: no severe fatigue: no   Past Medical History  Diagnosis Date  . DEPRESSION   . HIV DISEASE   . Sarcoidosis 2000    question of   Past Surgical History  Procedure Laterality Date  . Lung surgery  2000    vats  . Tonsillectomy and adenoidectomy     Family History  Problem Relation Age of Onset  . Hyperlipidemia Mother   . Hypertension Mother   . Sudden death Neg Hx   . Heart attack Neg Hx   . Diabetes Neg Hx   . Cancer Mother     glioblastoma   History  Substance Use Topics  . Smoking status: Never Smoker   . Smokeless tobacco: Never Used  . Alcohol Use: 0.5 oz/week    1 drink(s) per week     Comment: socially     Review of Systems  All other systems reviewed and are negative.    Allergies  Celebrex; Doxycycline; Morphine; and Wellbutrin  Home Medications   Current Outpatient Rx  Name  Route  Sig  Dispense  Refill  . azithromycin (ZITHROMAX) 250 MG tablet      Take 2 tabs PO x 1 dose, then 1 tab PO QD x 4 days   6 tablet   0   . citalopram (CELEXA) 20 MG tablet      TAKE 1 TABLET BY MOUTH ONCE DAILY   90 tablet   1   .  efavirenz-emtricitabine-tenofovir (ATRIPLA) 600-200-300 MG per tablet      TAKE 1 TABLET AT BEDTIME   30 tablet   2   . methocarbamol (ROBAXIN) 500 MG tablet   Oral   Take 500 mg by mouth as needed.         Marland Kitchen oseltamivir (TAMIFLU) 75 MG capsule   Oral   Take 1 capsule (75 mg total) by mouth 2 (two) times daily.   10 capsule   0   . tadalafil (CIALIS) 20 MG tablet   Oral   Take 0.5-1 tablets (10-20 mg total) by mouth every other day as needed for erectile dysfunction.   5 tablet   3   . Vitamin D, Ergocalciferol, (DRISDOL) 50000 UNITS CAPS capsule   Oral   Take 1 capsule (50,000 Units total) by mouth every 7 (seven) days.   12 capsule   0    BP 118/79  Pulse 64  Temp(Src) 97.3 F (36.3 C) (Oral)  Resp 18  Ht 5\' 8"  (1.727 m)  Wt 172 lb (78.019 kg)  BMI 26.16 kg/m2  SpO2 96% Physical Exam  Constitutional: He  appears well-developed and well-nourished.  HENT:  Head: Normocephalic and atraumatic.  Right Ear: External ear normal.  Left Ear: External ear normal.  +nasal erythema, rhinorrhea bilaterally, + post oropharyngeal erythema    Eyes: Conjunctivae are normal. Pupils are equal, round, and reactive to light.  Neck: Normal range of motion. Neck supple.  Cardiovascular: Normal rate and regular rhythm.   Pulmonary/Chest: Effort normal and breath sounds normal. He has no wheezes. He has no rales.  Abdominal: Soft.  Musculoskeletal: Normal range of motion.  Skin: Skin is warm.    ED Course  Procedures (including critical care time) Labs Review Labs Reviewed - No data to display Imaging Review No results found.  EKG Interpretation    Date/Time:    Ventricular Rate:    PR Interval:    QRS Duration:   QT Interval:    QTC Calculation:   R Axis:     Text Interpretation:              MDM   1. URI (upper respiratory infection)    Likely viral process.  Given baseline HIV, will place on zpak as well as tamiflu for bacterial and influenza ppx.   Overall reassuring exam today. No SOB, dyspnea, increased WOB or fever.  Discussed infectious and resp red flags at length Follow up as needed.     The patient and/or caregiver has been counseled thoroughly with regard to treatment plan and/or medications prescribed including dosage, schedule, interactions, rationale for use, and possible side effects and they verbalize understanding. Diagnoses and expected course of recovery discussed and will return if not improved as expected or if the condition worsens. Patient and/or caregiver verbalized understanding.         Shanda Howells, MD 11/14/13 [phone]

## 2013-11-14 NOTE — ED Notes (Signed)
Patient c/o ear pain, pressure, headache and productive cough since Tuesday night. Patient states he has tried OTC Dayquil and Ibuprofen without relief.

## 2013-11-16 ENCOUNTER — Telehealth: Payer: Self-pay | Admitting: Emergency Medicine

## 2013-11-19 ENCOUNTER — Encounter: Payer: Self-pay | Admitting: Internal Medicine

## 2013-11-19 ENCOUNTER — Ambulatory Visit (INDEPENDENT_AMBULATORY_CARE_PROVIDER_SITE_OTHER): Payer: 59 | Admitting: Internal Medicine

## 2013-11-19 ENCOUNTER — Other Ambulatory Visit: Payer: Self-pay | Admitting: *Deleted

## 2013-11-19 VITALS — BP 146/87 | HR 79 | Temp 97.6°F | Ht 68.0 in | Wt 180.0 lb

## 2013-11-19 DIAGNOSIS — G4700 Insomnia, unspecified: Secondary | ICD-10-CM | POA: Insufficient documentation

## 2013-11-19 DIAGNOSIS — Z23 Encounter for immunization: Secondary | ICD-10-CM

## 2013-11-19 DIAGNOSIS — B20 Human immunodeficiency virus [HIV] disease: Secondary | ICD-10-CM

## 2013-11-19 MED ORDER — EFAVIRENZ-EMTRICITAB-TENOFOVIR 600-200-300 MG PO TABS: ORAL_TABLET | ORAL | Status: DC

## 2013-11-19 NOTE — Progress Notes (Signed)
Patient ID: Bruce Woods, male   DOB: 26-Dec-1960, 53 y.o.   MRN: [id number]          [facility] for Infectious Disease  Patient Active Problem List   Diagnosis Date Noted  . DEPRESSION 01/03/2007    Priority: High  . HIV DISEASE 08/18/2006    Priority: High  . Insomnia 11/19/2013  . Annual physical exam 10/01/2013  . Right hip pain 05/25/2012  . Fatigue, low vit D 08/19/2011  . Erectile dysfunction 08/19/2011  . 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 SARCOIDOSIS 08/18/2006    Patient's Medications  New Prescriptions   No medications on file  Previous Medications   AZITHROMYCIN (ZITHROMAX) 250 MG TABLET    Take 2 tabs PO x 1 dose, then 1 tab PO QD x 4 days   CITALOPRAM (CELEXA) 20 MG TABLET    TAKE 1 TABLET BY MOUTH ONCE DAILY   EFAVIRENZ-EMTRICITABINE-TENOFOVIR (ATRIPLA) 600-200-300 MG PER TABLET    TAKE 1 TABLET AT BEDTIME   METHOCARBAMOL (ROBAXIN) 500 MG TABLET    Take 500 mg by mouth as needed.   OSELTAMIVIR (TAMIFLU) 75 MG CAPSULE    Take 1 capsule (75 mg total) by mouth 2 (two) times daily.   TADALAFIL (CIALIS) 20 MG TABLET    Take 0.5-1 tablets (10-20 mg total) by mouth every other day as needed for erectile dysfunction.   VITAMIN D, ERGOCALCIFEROL, (DRISDOL) 50000 UNITS CAPS CAPSULE    Take 1 capsule (50,000 Units total) by mouth every 7 (seven) days.  Modified Medications   No medications on file  Discontinued Medications   No medications on file    Subjective: Bruce Woods is in for his routine visit. As usual he never misses any doses of his Atripla. He's had a recent upper respiratory infection but is feeling better now. He was prescribed Tamiflu but did not take it because he is tolerated it poorly in the past. He was given a Z-Pak which he completed yesterday. His mood is good and he continues to take his Celexa.  Review of Systems: Pertinent items are noted in HPI.  Past Medical History  Diagnosis Date  . DEPRESSION   . HIV DISEASE   . Sarcoidosis 2000   question of    History  Substance Use Topics  . Smoking status: Never Smoker   . Smokeless tobacco: Never Used  . Alcohol Use: 0.5 oz/week    1 drink(s) per week     Comment: socially     Family History  Problem Relation Age of Onset  . Hyperlipidemia Mother   . Hypertension Mother   . Sudden death Neg Hx   . Heart attack Neg Hx   . Diabetes Neg Hx   . Cancer Mother     glioblastoma    Allergies  Allergen Reactions  . Celebrex [Celecoxib] Nausea Only  . Doxycycline   . Morphine   . Wellbutrin [Bupropion] Hives    Objective: Temp: 97.6 F (36.4 C) (01/20 0841) Temp src: Oral (01/20 0841) BP: 146/87 mmHg (01/20 0841) Pulse Rate: 79 (01/20 0841)  General: He is in no distress Oral: No oropharyngeal lesions Skin: No rash Lungs: Clear Cor: Regular S1 and S2 no murmurs Mood and affect: Normal  Lab Results Lab Results  Component Value Date   WBC 7.7 11/07/2013   HGB 17.0 11/07/2013   HCT 46.2 11/07/2013   MCV 90.9 11/07/2013   PLT 194 11/07/2013    Lab Results  Component Value Date   CREATININE  0.95 11/07/2013   BUN 13 11/07/2013   NA 138 11/07/2013   K 4.0 11/07/2013   CL 101 11/07/2013   CO2 29 11/07/2013    Lab Results  Component Value Date   ALT 22 11/07/2013   AST 19 11/07/2013   ALKPHOS 82 11/07/2013   BILITOT 0.6 11/07/2013    Lab Results  Component Value Date   CHOL 136 11/07/2013   HDL 30* 11/07/2013   LDLCALC 80 11/07/2013   TRIG 129 11/07/2013   CHOLHDL 4.5 11/07/2013    Lab Results HIV 1 RNA Quant (copies/mL)  Date Value  11/07/2013 40*  04/09/2013 <20   10/02/2012 32*     CD4 T Cell Abs (/uL)  Date Value  11/07/2013 410   04/09/2013 330*  10/02/2012 450      Assessment: His viral load is just barely detectable but has generally been low and stable for many years now.  Plan: 1. Continue Atripla 2. Followup after lab work in [place] months   Michel Bickers, MD [facility] for [place] [phone] pager   [phone]  cell 11/19/2013, 8:57 AM

## 2014-02-17 ENCOUNTER — Other Ambulatory Visit: Payer: Self-pay | Admitting: Internal Medicine

## 2014-02-18 ENCOUNTER — Other Ambulatory Visit: Payer: Self-pay | Admitting: Licensed Clinical Social Worker

## 2014-02-18 MED ORDER — CITALOPRAM HYDROBROMIDE 20 MG PO TABS: ORAL_TABLET | ORAL | Status: DC

## 2014-05-19 ENCOUNTER — Other Ambulatory Visit (INDEPENDENT_AMBULATORY_CARE_PROVIDER_SITE_OTHER): Payer: 59

## 2014-05-19 DIAGNOSIS — B20 Human immunodeficiency virus [HIV] disease: Secondary | ICD-10-CM

## 2014-05-20 LAB — T-HELPER CELL (CD4) - (RCID CLINIC ONLY)
CD4 % Helper T Cell: 26 % — ABNORMAL LOW (ref 33–55)
CD4 T Cell Abs: 650 /uL (ref 400–2700)

## 2014-05-24 LAB — HIV-1 RNA QUANT-NO REFLEX-BLD
HIV 1 RNA Quant: 220 copies/mL — ABNORMAL HIGH (ref <20)
HIV-1 RNA Quant, Log: 2.34 {Log} — ABNORMAL HIGH (ref <1.30)

## 2014-06-03 ENCOUNTER — Ambulatory Visit (INDEPENDENT_AMBULATORY_CARE_PROVIDER_SITE_OTHER): Payer: 59 | Admitting: Internal Medicine

## 2014-06-03 ENCOUNTER — Encounter: Payer: Self-pay | Admitting: Internal Medicine

## 2014-06-03 VITALS — BP 156/90 | HR 71 | Temp 97.7°F | Wt 180.5 lb

## 2014-06-03 DIAGNOSIS — B20 Human immunodeficiency virus [HIV] disease: Secondary | ICD-10-CM

## 2014-06-03 NOTE — Progress Notes (Signed)
Patient ID: Bruce Woods, male   DOB: 16-Mar-1961, 53 y.o.   MRN: [id number]          Patient Active Problem List   Diagnosis Date Noted  . DEPRESSION 01/03/2007    Priority: High  . HIV DISEASE 08/18/2006    Priority: High  . Insomnia 11/19/2013  . Annual physical exam 10/01/2013  . Right hip pain 05/25/2012  . Fatigue, low vit D 08/19/2011  . Erectile dysfunction 08/19/2011  . 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 SARCOIDOSIS 08/18/2006    Patient's Medications  New Prescriptions   No medications on file  Previous Medications   CITALOPRAM (CELEXA) 20 MG TABLET    TAKE 1 TABLET BY MOUTH ONCE DAILY   EFAVIRENZ-EMTRICITABINE-TENOFOVIR (ATRIPLA) 600-200-300 MG PER TABLET    TAKE 1 TABLET AT BEDTIME   HYDROCODONE-ACETAMINOPHEN (NORCO/VICODIN) 5-325 MG PER TABLET    Take 1 tablet by mouth every 6 (six) hours as needed for moderate pain.   METHOCARBAMOL (ROBAXIN) 500 MG TABLET    Take 500 mg by mouth as needed.   TADALAFIL (CIALIS) 20 MG TABLET    Take 0.5-1 tablets (10-20 mg total) by mouth every other day as needed for erectile dysfunction.   VITAMIN D, ERGOCALCIFEROL, (DRISDOL) 50000 UNITS CAPS CAPSULE    Take 1 capsule (50,000 Units total) by mouth every 7 (seven) days.  Modified Medications   No medications on file  Discontinued Medications   AZITHROMYCIN (ZITHROMAX) 250 MG TABLET    Take 2 tabs PO x 1 dose, then 1 tab PO QD x 4 days   OSELTAMIVIR (TAMIFLU) 75 MG CAPSULE    Take 1 capsule (75 mg total) by mouth 2 (two) times daily.    Subjective: Bruce Woods is in for his routine visit. As usual he never misses any of his Atripla. He remains very busy with work. 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 Review of Systems: Pertinent items are noted in HPI.  Past Medical History  Diagnosis Date  . DEPRESSION   . HIV DISEASE   . Sarcoidosis 2000    question of    History  Substance Use Topics  . Smoking status: Never Smoker   .  Smokeless tobacco: Never Used  . Alcohol Use: 0.5 oz/week    1 drink(s) per week     Comment: socially     Family History  Problem Relation Age of Onset  . Hyperlipidemia Mother   . Hypertension Mother   . Sudden death Neg Hx   . Heart attack Neg Hx   . Diabetes Neg Hx   . Cancer Mother     glioblastoma    Allergies  Allergen Reactions  . Celebrex [Celecoxib] Nausea Only  . Doxycycline   . Morphine   . Wellbutrin [Bupropion] Hives    Objective: Temp: 97.7 F (36.5 C) (08/04 0840) Temp src: Oral (08/04 0840) BP: 156/90 mmHg (08/04 0840) Pulse Rate: 71 (08/04 0840) Body mass index is 27.45 kg/(m^2).  General: He is in good spirits Oral: No oropharyngeal lesions Skin: No rash Lungs: Clear Cor: S1 and S2 and no murmurs   Lab Results Lab Results  Component Value Date   WBC 7.7 11/07/2013   HGB 17.0 11/07/2013   HCT 46.2 11/07/2013   MCV 90.9 11/07/2013   PLT 194 11/07/2013    Lab Results  Component Value Date   CREATININE 0.95 11/07/2013   BUN 13 11/07/2013   NA 138 11/07/2013   K  4.0 11/07/2013   CL 101 11/07/2013   CO2 29 11/07/2013    Lab Results  Component Value Date   ALT 22 11/07/2013   AST 19 11/07/2013   ALKPHOS 82 11/07/2013   BILITOT 0.6 11/07/2013    Lab Results  Component Value Date   CHOL 136 11/07/2013   HDL 30* 11/07/2013   LDLCALC 80 11/07/2013   TRIG 129 11/07/2013   CHOLHDL 4.5 11/07/2013    Lab Results HIV 1 RNA Quant (copies/mL)  Date Value  05/19/2014 220*  11/07/2013 40*  04/09/2013 <20      CD4 T Cell Abs (/uL)  Date Value  05/19/2014 650   11/07/2013 410   04/09/2013 330*     Assessment: Bruce Woods is doing well and his adherence remains excellent. His viral load has bumped up to 220. As far load has followed a slightly up-and-down pattern over the last 5 years. I doubt that he has emerging resistance but I will have him return for repeat viral load in 6 weeks.  Plan: 1. Continue Atripla 2. Repeat viral load in 6 weeks   Michel Bickers, MD [facility] for [place] [phone] pager   [phone] cell 06/03/2014, 8:55 AM

## 2014-07-17 ENCOUNTER — Other Ambulatory Visit: Payer: 59

## 2014-07-17 DIAGNOSIS — B20 Human immunodeficiency virus [HIV] disease: Secondary | ICD-10-CM

## 2014-07-19 LAB — HIV-1 RNA QUANT-NO REFLEX-BLD
HIV 1 RNA Quant: 29 copies/mL — ABNORMAL HIGH (ref <20)
HIV-1 RNA Quant, Log: 1.46 {Log} — ABNORMAL HIGH (ref <1.30)

## 2014-08-11 ENCOUNTER — Other Ambulatory Visit: Payer: Self-pay | Admitting: Internal Medicine

## 2014-08-11 DIAGNOSIS — F32A Depression, unspecified: Secondary | ICD-10-CM

## 2014-08-11 DIAGNOSIS — F329 Major depressive disorder, single episode, unspecified: Secondary | ICD-10-CM

## 2014-10-06 ENCOUNTER — Encounter: Payer: 59 | Admitting: Internal Medicine

## 2014-12-01 ENCOUNTER — Other Ambulatory Visit: Payer: Self-pay | Admitting: Internal Medicine

## 2014-12-29 ENCOUNTER — Encounter: Payer: Self-pay | Admitting: Internal Medicine

## 2014-12-29 ENCOUNTER — Ambulatory Visit (INDEPENDENT_AMBULATORY_CARE_PROVIDER_SITE_OTHER): Payer: 59 | Admitting: Internal Medicine

## 2014-12-29 VITALS — BP 130/82 | HR 65 | Temp 98.3°F | Ht 68.0 in | Wt 178.1 lb

## 2014-12-29 DIAGNOSIS — R739 Hyperglycemia, unspecified: Secondary | ICD-10-CM

## 2014-12-29 DIAGNOSIS — R7989 Other specified abnormal findings of blood chemistry: Secondary | ICD-10-CM

## 2014-12-29 DIAGNOSIS — Z0000 Encounter for general adult medical examination without abnormal findings: Secondary | ICD-10-CM

## 2014-12-29 LAB — LIPID PANEL
Cholesterol: 142 mg/dL (ref 0–200)
HDL: 30 mg/dL — AB (ref >39.00)
LDL Cholesterol: 81 mg/dL (ref 0–99)
NonHDL: 112
Total CHOL/HDL Ratio: 5
Triglycerides: 156 mg/dL — ABNORMAL HIGH (ref 0.0–149.0)
VLDL: 31.2 mg/dL (ref 0.0–40.0)

## 2014-12-29 LAB — CBC WITH DIFFERENTIAL/PLATELET
BASOS PCT: 0.3 % (ref 0.0–3.0)
Basophils Absolute: 0 10*3/uL (ref 0.0–0.1)
EOS ABS: 0 10*3/uL (ref 0.0–0.7)
EOS PCT: 0.7 % (ref 0.0–5.0)
HCT: 47.2 % (ref 39.0–52.0)
Hemoglobin: 16.5 g/dL (ref 13.0–17.0)
LYMPHS ABS: 1.2 10*3/uL (ref 0.7–4.0)
Lymphocytes Relative: 19.8 % (ref 12.0–46.0)
MCHC: 35.1 g/dL (ref 30.0–36.0)
MCV: 93.5 fl (ref 78.0–100.0)
MONO ABS: 0.4 10*3/uL (ref 0.1–1.0)
Monocytes Relative: 6 % (ref 3.0–12.0)
Neutro Abs: 4.6 10*3/uL (ref 1.4–7.7)
Neutrophils Relative %: 73.2 % (ref 43.0–77.0)
Platelets: 175 10*3/uL (ref 150.0–400.0)
RBC: 5.04 Mil/uL (ref 4.22–5.81)
RDW: 13.6 % (ref 11.5–15.5)
WBC: 6.2 10*3/uL (ref 4.0–10.5)

## 2014-12-29 LAB — COMPREHENSIVE METABOLIC PANEL
ALBUMIN: 4.4 g/dL (ref 3.5–5.2)
ALT: 16 U/L (ref 0–53)
AST: 17 U/L (ref 0–37)
Alkaline Phosphatase: 78 U/L (ref 39–117)
BUN: 17 mg/dL (ref 6–23)
CO2: 24 meq/L (ref 19–32)
CREATININE: 1.05 mg/dL (ref 0.40–1.50)
Calcium: 9.1 mg/dL (ref 8.4–10.5)
Chloride: 106 mEq/L (ref 96–112)
GFR: 77.69 mL/min (ref >60.00)
GLUCOSE: 103 mg/dL — AB (ref 70–99)
Potassium: 3.9 mEq/L (ref 3.5–5.1)
Sodium: 140 mEq/L (ref 135–145)
TOTAL PROTEIN: 7.1 g/dL (ref 6.0–8.3)
Total Bilirubin: 0.5 mg/dL (ref 0.2–1.2)

## 2014-12-29 LAB — PSA: PSA: 0.59 ng/mL (ref 0.10–4.00)

## 2014-12-29 LAB — HEMOGLOBIN A1C: Hgb A1c MFr Bld: 5.3 % (ref 4.6–6.5)

## 2014-12-29 LAB — VITAMIN D 25 HYDROXY (VIT D DEFICIENCY, FRACTURES): VITD: 8.29 ng/mL — ABNORMAL LOW (ref 30.00–100.00)

## 2014-12-29 MED ORDER — SILDENAFIL CITRATE 100 MG PO TABS: 50.0000 mg | ORAL_TABLET | Freq: Every day | ORAL | Status: DC | PRN

## 2014-12-29 NOTE — Progress Notes (Signed)
Subjective:    Patient ID: Bruce Woods, male    DOB: [birth year], 54 y.o.   MRN: [id number]  DOS:  12/29/2014 Type of visit - description : cpx Interval history: Doing well, good compliance with all medications, depression well-controlled. Sees ID consistently, has a follow-up  in few weeks   Review of Systems  Constitutional: No fever, chills. No unexplained wt changes. No unusual sweats HEENT: No dental problems, ear discharge, facial swelling, voice changes. No eye discharge, redness or intolerance to light Respiratory: No wheezing or difficulty breathing. No cough , mucus production Cardiovascular: No CP, leg swelling or palpitations GI: no nausea, vomiting, diarrhea or abdominal pain.  No blood in the stools. No dysphagia   Endocrine: No polyphagia, polyuria or polydipsia GU: No dysuria, gross hematuria, difficulty urinating. No urinary urgency or frequency. Musculoskeletal: No joint swellings  , occasionally has hip pain, sees sports medicine, uses Vicodin, Robaxin as needed. Teens units help significantly Skin: No change in the color of the skin, palor or rash Allergic, immunologic: No environmental allergies or food allergies Neurological: No dizziness or syncope. No headaches. No diplopia, slurred speech, motor deficits, facial numbness Hematological: No enlarged lymph nodes, easy bruising or bleeding Psychiatry: No suicidal ideas, hallucinations, behavior problems or confusion. No unusual/severe anxiety or depression.    Past Medical History  Diagnosis Date  . DEPRESSION   . HIV DISEASE   . Sarcoidosis 2000    question of    Past Surgical History  Procedure Laterality Date  . Lung surgery  2000    vats  . Tonsillectomy and adenoidectomy      History   Social History  . Marital Status: Married    Spouse Name: N/A  . Number of Children: 2  . Years of Education: N/A   Occupational History  . 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  Smoking status: Never Smoker   . Smokeless tobacco: Never Used  . Alcohol Use: 0.5 oz/week    1 drink(s) per week     Comment: socially   . Drug Use: No  . Sexual Activity: Yes     Comment: declined condoms   Other Topics Concern  . Not on file   Social History Narrative     Family History  Problem Relation Age of Onset  . Hyperlipidemia Mother   . Hypertension Mother   . Sudden death Neg Hx   . Heart attack Neg Hx   . Diabetes Neg Hx   . Cancer Mother     glioblastoma  . Colon cancer Neg Hx   . Prostate cancer Neg Hx        Medication List       This list is accurate as of: 12/29/14  9:00 PM.  Always use your most recent med list.               ATRIPLA 600-200-300 MG per tablet  Generic drug:  efavirenz-emtricitabine-tenofovir  TAKE 1 TABLET BY MOUTH AT BEDTIME     citalopram 20 MG tablet  Commonly known as:  CELEXA  TAKE 1 TABLET BY MOUTH ONCE DAILY     HYDROcodone-acetaminophen 5-325 MG per tablet  Commonly known as:  NORCO/VICODIN  Take 1 tablet by mouth every 6 (six) hours as needed for moderate pain.     methocarbamol 500 MG tablet  Commonly known as:  ROBAXIN  Take 500 mg by mouth as needed.     sildenafil  100 MG tablet  Commonly known as:  VIAGRA  Take 0.5-1 tablets (50-100 mg total) by mouth daily as needed for erectile dysfunction.           Objective:   Physical Exam BP 130/82 mmHg  Pulse 65  Temp(Src) 98.3 F (36.8 C) (Oral)  Ht 5\' 8"  (1.727 m)  Wt 178 lb 2 oz (80.797 kg)  BMI 27.09 kg/m2  SpO2 97% General:   Well developed, well nourished . NAD.  Neck:  Full range of motion. Supple. No  thyromegaly , normal carotid pulse, no LAD. HEENT:  Normocephalic . Face symmetric, atraumatic Lungs:  CTA B Normal respiratory effort, no intercostal retractions, no accessory muscle use. Heart: RRR,  no murmur.  Abdomen:  Not distended, soft, non-tender. No rebound or rigidity. No mass,organomegaly Muscle skeletal: no pretibial edema  bilaterally  Skin: Exposed areas without rash. Not pale. Not jaundice Rectal:  External abnormalities: none. Normal sphincter tone. No rectal masses or tenderness.  Stool -- not found  Prostate: Prostate gland firm and smooth, no enlargement, nodularity, tenderness, mass, asymmetry or induration.  Neurologic:  alert & oriented X3.  Speech normal, gait appropriate for age and unassisted Strength symmetric and appropriate for age.  Psych: Cognition and judgment appear intact.  Cooperative with normal attention span and concentration.  Behavior appropriate. No anxious or depressed appearing.        Assessment & Plan:

## 2014-12-29 NOTE — Progress Notes (Signed)
Pre visit review using our clinic review tool, if applicable. No additional management support is needed unless otherwise documented below in the visit note. 

## 2014-12-29 NOTE — Assessment & Plan Note (Addendum)
Immunizations-- at work and per ID DRE normal, check a PSA cscope normal at age 54 Bruce Woods) EKG wnl Has a healthy life style   Other issues: HIV per ID Depression well-controlled on citalopram Low vitamin D, status post ergocalciferol, now on a multivitamin, check levels My hyperglycemia, check A1c Follow-up one year

## 2014-12-29 NOTE — Patient Instructions (Signed)
Get your blood work before you leave     Please go to the front and schedule a visit in 1 year for a physical exam     Come back fasting

## 2015-01-01 MED ORDER — VITAMIN D (ERGOCALCIFEROL) 1.25 MG (50000 UNIT) PO CAPS: 50000.0000 [IU] | ORAL_CAPSULE | ORAL | Status: DC

## 2015-01-01 NOTE — Addendum Note (Signed)
Addended by: Wilfrid Lund on: 01/01/2015 08:34 AM   Modules accepted: Orders

## 2015-01-12 ENCOUNTER — Other Ambulatory Visit: Payer: Self-pay | Admitting: *Deleted

## 2015-01-12 DIAGNOSIS — Z113 Encounter for screening for infections with a predominantly sexual mode of transmission: Secondary | ICD-10-CM

## 2015-02-02 ENCOUNTER — Other Ambulatory Visit: Payer: 59

## 2015-02-02 DIAGNOSIS — B20 Human immunodeficiency virus [HIV] disease: Secondary | ICD-10-CM

## 2015-02-02 DIAGNOSIS — Z113 Encounter for screening for infections with a predominantly sexual mode of transmission: Secondary | ICD-10-CM

## 2015-02-03 LAB — T-HELPER CELL (CD4) - (RCID CLINIC ONLY)
CD4 T CELL HELPER: 26 % — AB (ref 33–55)
CD4 T Cell Abs: 430 /uL (ref 400–2700)

## 2015-02-03 LAB — HIV-1 RNA QUANT-NO REFLEX-BLD: HIV-1 RNA Quant, Log: <1.3 {Log} (ref <1.30)

## 2015-02-03 LAB — RPR

## 2015-02-06 ENCOUNTER — Other Ambulatory Visit: Payer: Self-pay | Admitting: Internal Medicine

## 2015-02-17 ENCOUNTER — Ambulatory Visit (INDEPENDENT_AMBULATORY_CARE_PROVIDER_SITE_OTHER): Payer: 59 | Admitting: Internal Medicine

## 2015-02-17 ENCOUNTER — Encounter: Payer: Self-pay | Admitting: Internal Medicine

## 2015-02-17 DIAGNOSIS — B20 Human immunodeficiency virus [HIV] disease: Secondary | ICD-10-CM | POA: Diagnosis not present

## 2015-02-17 NOTE — Progress Notes (Signed)
Patient ID: Bruce Woods, male   DOB: 1961-04-04, 54 y.o.   MRN: [id number]          Patient Active Problem List   Diagnosis Date Noted  . DEPRESSION 01/03/2007    Priority: High  . Human immunodeficiency virus (HIV) disease 08/18/2006    Priority: High  . Insomnia 11/19/2013  . Annual physical exam 10/01/2013  . Right hip pain 05/25/2012  . Fatigue, low vit D 08/19/2011  . Erectile dysfunction 08/19/2011  . 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 SARCOIDOSIS 08/18/2006    Patient's Medications  New Prescriptions   No medications on file  Previous Medications   ATRIPLA 600-200-300 MG PER TABLET    TAKE 1 TABLET BY MOUTH AT BEDTIME   CITALOPRAM (CELEXA) 20 MG TABLET    TAKE 1 TABLET BY MOUTH ONCE DAILY   METHOCARBAMOL (ROBAXIN) 500 MG TABLET    Take 500 mg by mouth as needed.   MULTIPLE VITAMIN (MULTIVITAMIN) TABLET    Take 1 tablet by mouth daily.   SILDENAFIL (VIAGRA) 100 MG TABLET    Take 0.5-1 tablets (50-100 mg total) by mouth daily as needed for erectile dysfunction.  Modified Medications   No medications on file  Discontinued Medications   HYDROCODONE-ACETAMINOPHEN (NORCO/VICODIN) 5-325 MG PER TABLET    Take 1 tablet by mouth every 6 (six) hours as needed for moderate pain.   VITAMIN D, ERGOCALCIFEROL, (DRISDOL) 50000 UNITS CAPS CAPSULE    Take 1 capsule (50,000 Units total) by mouth every 7 (seven) days.    Subjective: Bruce Woods is in for his routine visit for HIV follow-up. His usual he never misses a single dose of his Atripla. He denies having any hangover effect or other side effects from it. He continues to take citalopram and does not feel depressed. He remains very busy at work. He has intermittent problems with right hip pain due to bursitis. He takes Robaxin as needed and soaks in his hot tub. He recently developed a nonpruritic rash on his neck. He believes he aggravated it while shaving.  Review of Systems: Constitutional: negative Eyes: negative Ears, nose, mouth,  throat, and face: negative Respiratory: negative Cardiovascular: negative Gastrointestinal: negative Genitourinary:negative  Past Medical History  Diagnosis Date  . DEPRESSION   . HIV DISEASE   . Sarcoidosis 2000    question of    History  Substance Use Topics  . Smoking status: Never Smoker   . Smokeless tobacco: Never Used  . Alcohol Use: 0.6 oz/week    1 Standard drinks or equivalent per week     Comment: socially     Family History  Problem Relation Age of Onset  . Hyperlipidemia Mother   . Hypertension Mother   . Sudden death Neg Hx   . Heart attack Neg Hx   . Diabetes Neg Hx   . Cancer Mother     glioblastoma  . Colon cancer Neg Hx   . Prostate cancer Neg Hx     Allergies  Allergen Reactions  . Celebrex [Celecoxib] Nausea Only  . Doxycycline   . Morphine   . Wellbutrin [Bupropion] Hives    Objective: Temp: 97.9 F (36.6 C) (04/19 0848) Temp Source: Oral (04/19 0848) BP: 172/86 mmHg (04/19 0848) Pulse Rate: 77 (04/19 0848) Body mass index is 26.8 kg/(m^2).  General: He is in good spirits Oral: No oropharyngeal lesions Skin: Red maculopapular rash on neck, right side greater than left anteriorly Lungs: Clear Cor: Regular S1 and S2 with no murmurs  Abdomen: Soft and nontender Joints and extremities: No tenderness over right hip. Good range of motion without pain Mood: He does not appear anxious or depressed  Lab Results Lab Results  Component Value Date   WBC 6.2 12/29/2014   HGB 16.5 12/29/2014   HCT 47.2 12/29/2014   MCV 93.5 12/29/2014   PLT 175.0 12/29/2014    Lab Results  Component Value Date   CREATININE 1.05 12/29/2014   BUN 17 12/29/2014   NA 140 12/29/2014   K 3.9 12/29/2014   CL 106 12/29/2014   CO2 24 12/29/2014    Lab Results  Component Value Date   ALT 16 12/29/2014   AST 17 12/29/2014   ALKPHOS 78 12/29/2014   BILITOT 0.5 12/29/2014    Lab Results  Component Value Date   CHOL 142 12/29/2014   HDL 30.00*  12/29/2014   LDLCALC 81 12/29/2014   TRIG 156.0* 12/29/2014   CHOLHDL 5 12/29/2014    Lab Results HIV 1 RNA QUANT (copies/mL)  Date Value  02/02/2015 <20  07/17/2014 29*  05/19/2014 220*   CD4 T CELL ABS (/uL)  Date Value  02/02/2015 430  05/19/2014 650  11/07/2013 410     Assessment: His HIV is back under very good control with an undetectable viral load. He is tolerating his Atripla very well.  His depression is under very good control.  He has right hip pain but hip films do not show any evidence of degenerative arthritis or avascular necrosis.   His rash is probably very minor folliculitis.    Plan: 1. Continue Atripla 2. Follow-up after lab work in [place] months    Michel Bickers, MD [facility] for [place] [phone] pager   [phone] cell 02/17/2015, 9:10 AM

## 2015-05-26 ENCOUNTER — Other Ambulatory Visit: Payer: Self-pay | Admitting: Internal Medicine

## 2015-08-04 ENCOUNTER — Other Ambulatory Visit: Payer: 59

## 2015-08-04 DIAGNOSIS — B20 Human immunodeficiency virus [HIV] disease: Secondary | ICD-10-CM

## 2015-08-05 LAB — T-HELPER CELL (CD4) - (RCID CLINIC ONLY)
CD4 T CELL ABS: 470 /uL (ref 400–2700)
CD4 T CELL HELPER: 25 % — AB (ref 33–55)

## 2015-08-05 LAB — HIV-1 RNA QUANT-NO REFLEX-BLD: HIV-1 RNA Quant, Log: <1.3 {Log} (ref <1.30)

## 2015-08-06 ENCOUNTER — Other Ambulatory Visit: Payer: Self-pay | Admitting: Infectious Diseases

## 2015-08-14 ENCOUNTER — Encounter: Payer: Self-pay | Admitting: *Deleted

## 2015-08-14 ENCOUNTER — Emergency Department
Admission: EM | Admit: 2015-08-14 | Discharge: 2015-08-14 | Disposition: A | Discharge status: Home / Self Care | Payer: 59 | Source: Home / Self Care | Attending: Family Medicine | Admitting: Family Medicine

## 2015-08-14 DIAGNOSIS — J069 Acute upper respiratory infection, unspecified: Secondary | ICD-10-CM

## 2015-08-14 DIAGNOSIS — B9789 Other viral agents as the cause of diseases classified elsewhere: Principal | ICD-10-CM

## 2015-08-14 MED ORDER — AZITHROMYCIN 250 MG PO TABS: ORAL_TABLET | ORAL | Status: DC

## 2015-08-14 MED ORDER — BENZONATATE 200 MG PO CAPS: 200.0000 mg | ORAL_CAPSULE | Freq: Every day | ORAL | Status: DC

## 2015-08-14 NOTE — Discharge Instructions (Signed)
Take plain guaifenesin (1200mg  extended release tabs such as Mucinex) twice daily, with plenty of water, for cough and congestion.  May add Pseudoephedrine (30mg , one or two every 4 to 6 hours) for sinus congestion.  Get adequate rest.   May use Afrin nasal spray (or generic oxymetazoline) twice daily for about 5 days and then discontinue.  Also recommend using saline nasal spray several times daily and saline nasal irrigation (AYR is a common brand).  Use Flonase nasal spray each morning after using Afrin nasal spray and saline nasal irrigation. Try warm salt water gargles for sore throat.  Stop all antihistamines for now, and other non-prescription cough/cold preparations.   Follow-up with family doctor if not improving about10 days.

## 2015-08-14 NOTE — ED Notes (Signed)
Pt c/o 3 days of productive cough with yellow sputum, sinus pain and drainage, HA, fatigue, SOB. Taken IBF, tylenol, Sudafed, Dayquil, Flonase and mucinex.

## 2015-08-14 NOTE — ED Provider Notes (Signed)
CSN: [id number]     Arrival date & time 08/14/15  1030 History   First MD Initiated Contact with Patient 08/14/15 1100     Chief Complaint  Patient presents with  . Cough  . Fatigue  . Facial Pain      HPI Comments: Patient complains of three day history of typical cold-like symptoms including sinus congestion, headache, fatigue, hoarseness, sneezing, and cough.   The history is provided by the patient.    Past Medical History  Diagnosis Date  . DEPRESSION   . HIV DISEASE   . Sarcoidosis ([place]) 2000    question of   Past Surgical History  Procedure Laterality Date  . Lung surgery  2000    vats  . Tonsillectomy and adenoidectomy     Family History  Problem Relation Age of Onset  . Hyperlipidemia Mother   . Hypertension Mother   . Sudden death Neg Hx   . Heart attack Neg Hx   . Diabetes Neg Hx   . Cancer Mother     glioblastoma  . Colon cancer Neg Hx   . Prostate cancer Neg Hx    Social History  Substance Use Topics  . Smoking status: Former Research scientist (life sciences)  . Smokeless tobacco: Never Used  . Alcohol Use: 0.6 oz/week    1 Standard drinks or equivalent per week     Comment: socially     Review of Systems No sore throat + hoarse + sneezing + cough No pleuritic pain No wheezing + nasal congestion + post-nasal drainage + sinus pain/pressure No itchy/red eyes ? earache No hemoptysis + SOB No fever, + chills/sweats + nausea No vomiting No abdominal pain No diarrhea No urinary symptoms No skin rash + fatigue No myalgias + headache Used OTC meds without relief  Allergies  Celebrex; Doxycycline; Morphine; and Wellbutrin  Home Medications   Prior to Admission medications   Medication Sig Start Date End Date Taking? Authorizing Provider  ATRIPLA 810-175-102 MG per tablet TAKE 1 TABLET BY MOUTH AT BEDTIME 05/26/15  Yes Michel Bickers, MD  citalopram (CELEXA) 20 MG tablet TAKE 1 TABLET BY MOUTH ONCE DAILY 08/07/15  Yes Campbell Riches, MD  azithromycin  (ZITHROMAX Z-PAK) 250 MG tablet Take 2 tabs today; then begin one tab once daily for 4 more days. 08/14/15   Kandra Nicolas, MD  benzonatate (TESSALON) 200 MG capsule Take 1 capsule (200 mg total) by mouth at bedtime. Take as needed for cough 08/14/15   Kandra Nicolas, MD  methocarbamol (ROBAXIN) 500 MG tablet Take 500 mg by mouth as needed.    Historical Provider, MD  Multiple Vitamin (MULTIVITAMIN) tablet Take 1 tablet by mouth daily.    Historical Provider, MD  sildenafil (VIAGRA) 100 MG tablet Take 0.5-1 tablets (50-100 mg total) by mouth daily as needed for erectile dysfunction. 12/29/14   Colon Branch, MD   Meds Ordered and Administered this Visit  Medications - No data to display  BP 145/88 mmHg  Pulse 75  Temp(Src) 98.2 F (36.8 C) (Oral)  Resp 16  Wt 179 lb (81.194 kg)  SpO2 95% No data found.   Physical Exam Nursing notes and Vital Signs reviewed. Appearance:  Patient appears stated age, and in no acute distress Eyes:  Pupils are equal, round, and reactive to light and accomodation.  Extraocular movement is intact.  Conjunctivae are not inflamed  Ears:  Canals normal.  Tympanic membranes normal.  Nose:  Congested turbinates.  No sinus tenderness.  Pharynx:  Normal Neck:  Supple.   Tender enlarged posterior nodes are palpated bilaterally  Lungs:  Clear to auscultation.  Breath sounds are equal.  Moving air well. Heart:  Regular rate and rhythm without murmurs, rubs, or gallops.  Abdomen:  Nontender without masses or hepatosplenomegaly.  Bowel sounds are present.  No CVA or flank tenderness.  Extremities:  No edema.  No calf tenderness Skin:  No rash present.   ED Course  Procedures none    MDM   1. Viral URI with cough    Because of patient's history of HIV, will begin Z-pack (has had no adverse effects in the past) for bacterial coverage.  Prescription written for Benzonatate [facility]) to take at bedtime for night-time cough.  Take plain guaifenesin (1200mg   extended release tabs such as Mucinex) twice daily, with plenty of water, for cough and congestion.  May add Pseudoephedrine (30mg , one or two every 4 to 6 hours) for sinus congestion.  Get adequate rest.   May use Afrin nasal spray (or generic oxymetazoline) twice daily for about 5 days and then discontinue.  Also recommend using saline nasal spray several times daily and saline nasal irrigation (AYR is a common brand).  Use Flonase nasal spray each morning after using Afrin nasal spray and saline nasal irrigation. Try warm salt water gargles for sore throat.  Stop all antihistamines for now, and other non-prescription cough/cold preparations.   Follow-up with family doctor if not improving about10 days.     Kandra Nicolas, MD 08/14/15 1249

## 2015-08-16 ENCOUNTER — Telehealth: Payer: Self-pay | Admitting: Emergency Medicine

## 2015-08-18 ENCOUNTER — Encounter: Payer: Self-pay | Admitting: Internal Medicine

## 2015-08-18 ENCOUNTER — Ambulatory Visit (INDEPENDENT_AMBULATORY_CARE_PROVIDER_SITE_OTHER): Payer: 59 | Admitting: Internal Medicine

## 2015-08-18 DIAGNOSIS — B20 Human immunodeficiency virus [HIV] disease: Secondary | ICD-10-CM

## 2015-08-18 MED ORDER — EFAVIRENZ-EMTRICITAB-TENOFOVIR 600-200-300 MG PO TABS: 1.0000 | ORAL_TABLET | Freq: Every day | ORAL | Status: DC

## 2015-08-18 NOTE — Progress Notes (Signed)
Patient ID: Bruce Woods, male   DOB: [birth year], 54 y.o.   MRN: [id number]          Patient Active Problem List   Diagnosis Date Noted  . DEPRESSION 01/03/2007    Priority: High  . Human immunodeficiency virus (HIV) disease ([place]) 08/18/2006    Priority: High  . Insomnia 11/19/2013  . Annual physical exam 10/01/2013  . Right hip pain 05/25/2012  . Fatigue, low vit D 08/19/2011  . Erectile dysfunction 08/19/2011  . 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 SARCOIDOSIS 08/18/2006    Patient's Medications  New Prescriptions   No medications on file  Previous Medications   AZITHROMYCIN (ZITHROMAX Z-PAK) 250 MG TABLET    Take 2 tabs today; then begin one tab once daily for 4 more days.   BENZONATATE (TESSALON) 200 MG CAPSULE    Take 1 capsule (200 mg total) by mouth at bedtime. Take as needed for cough   CITALOPRAM (CELEXA) 20 MG TABLET    TAKE 1 TABLET BY MOUTH ONCE DAILY   METHOCARBAMOL (ROBAXIN) 500 MG TABLET    Take 500 mg by mouth as needed.   MULTIPLE VITAMIN (MULTIVITAMIN) TABLET    Take 1 tablet by mouth daily.   SILDENAFIL (VIAGRA) 100 MG TABLET    Take 0.5-1 tablets (50-100 mg total) by mouth daily as needed for erectile dysfunction.  Modified Medications   Modified Medication Previous Medication   EFAVIRENZ-EMTRICITABINE-TENOFOVIR (ATRIPLA) 600-200-300 MG TABLET ATRIPLA 600-200-300 MG per tablet      Take 1 tablet by mouth at bedtime.    TAKE 1 TABLET BY MOUTH AT BEDTIME  Discontinued Medications   No medications on file    Subjective: Arrion is in for his routine HIV follow-up visit. As usual he never misses a single dose of his Atripla. He's had some recent sinus congestion and low-grade fever. He has not had any cough or shortness of breath. Otherwise he is feeling well.   Review of Systems: Pertinent items are noted in HPI.  Past Medical History  Diagnosis Date  . DEPRESSION   . HIV DISEASE   . Sarcoidosis ([place]) 2000    question of    Social History  Substance  Use Topics  . Smoking status: Former Research scientist (life sciences)  . Smokeless tobacco: Never Used  . Alcohol Use: 0.6 oz/week    1 Standard drinks or equivalent per week     Comment: socially     Family History  Problem Relation Age of Onset  . Hyperlipidemia Mother   . Hypertension Mother   . Sudden death Neg Hx   . Heart attack Neg Hx   . Diabetes Neg Hx   . Cancer Mother     glioblastoma  . Colon cancer Neg Hx   . Prostate cancer Neg Hx     Allergies  Allergen Reactions  . Celebrex [Celecoxib] Nausea Only  . Doxycycline   . Morphine   . Wellbutrin [Bupropion] Hives    Objective:  Filed Vitals:   08/18/15 0843  BP: 152/84  Pulse: 60  Temp: 97.5 F (36.4 C)  TempSrc: Oral  Height: 5\' 8"  (1.727 m)  Weight: 177 lb (80.287 kg)   Body mass index is 26.92 kg/(m^2).  General: He is smiling and in good spirits Oral: No oropharyngeal lesions Skin: No rash Lungs: Clear Cor: Regular S1 and S2 with no murmurs  Lab Results Lab Results  Component Value Date   WBC 6.2 12/29/2014   HGB 16.5 12/29/2014   HCT  47.2 12/29/2014   MCV 93.5 12/29/2014   PLT 175.0 12/29/2014    Lab Results  Component Value Date   CREATININE 1.05 12/29/2014   BUN 17 12/29/2014   NA 140 12/29/2014   K 3.9 12/29/2014   CL 106 12/29/2014   CO2 24 12/29/2014    Lab Results  Component Value Date   ALT 16 12/29/2014   AST 17 12/29/2014   ALKPHOS 78 12/29/2014   BILITOT 0.5 12/29/2014    Lab Results  Component Value Date   CHOL 142 12/29/2014   HDL 30.00* 12/29/2014   LDLCALC 81 12/29/2014   TRIG 156.0* 12/29/2014   CHOLHDL 5 12/29/2014    Lab Results HIV 1 RNA QUANT (copies/mL)  Date Value  08/04/2015 <20  02/02/2015 <20  07/17/2014 29*   CD4 T CELL ABS (/uL)  Date Value  08/04/2015 470  02/02/2015 430  05/19/2014 650     Problem List Items Addressed This Visit      High   Human immunodeficiency virus (HIV) disease ([place])    His HIV infection remains under excellent control. I talked  him about potentially safer alternative, antiretroviral therapy such as Genvoya. This would lessen the risk of nephrotoxicity and osteoporosis. At this point he would like to continue Atripla. He states that he is a creature habit and that the Atripla helps him sleep well at night. He will follow-up after repeat lab work in 6 months.      Relevant Medications   efavirenz-emtricitabine-tenofovir (ATRIPLA) 600-200-300 MG tablet   Other Relevant Orders   T-helper cell (CD4)- ([facility] clinic only)   HIV 1 RNA quant-no reflex-bld   CBC   Comprehensive metabolic panel   RPR   Lipid panel        Michel Bickers, MD [facility] for [place] [phone] pager   [phone] cell 08/18/2015, 9:04 AM

## 2015-08-18 NOTE — Assessment & Plan Note (Signed)
His HIV infection remains under excellent control. I talked him about potentially safer alternative, antiretroviral therapy such as Genvoya. This would lessen the risk of nephrotoxicity and osteoporosis. At this point he would like to continue Atripla. He states that he is a creature habit and that the Atripla helps him sleep well at night. He will follow-up after repeat lab work in 6 months.

## 2015-11-10 MED FILL — ATRIPLA 600-200-300 MG TABS: 600-200-300 | 60 days supply | Qty: 60 | Fill #1

## 2015-11-10 MED FILL — CITALOPRAM HBR 20 MG TABLET: 20 | 90 days supply | Qty: 90 | Fill #1

## 2015-12-08 ENCOUNTER — Telehealth: Payer: Self-pay | Admitting: Internal Medicine

## 2015-12-08 NOTE — Telephone Encounter (Signed)
Patient received flu shot 08/2015 with employer [employer]

## 2015-12-08 NOTE — Telephone Encounter (Signed)
Immunization record updated.

## 2015-12-31 ENCOUNTER — Encounter: Payer: Self-pay | Admitting: Behavioral Health

## 2015-12-31 ENCOUNTER — Telehealth: Payer: Self-pay | Admitting: Behavioral Health

## 2015-12-31 NOTE — Telephone Encounter (Signed)
Pre-Visit Call completed with patient and chart updated.   Pre-Visit Info documented in Specialty Comments under [employer].    

## 2016-01-01 ENCOUNTER — Ambulatory Visit (INDEPENDENT_AMBULATORY_CARE_PROVIDER_SITE_OTHER): Payer: 59 | Admitting: Internal Medicine

## 2016-01-01 ENCOUNTER — Encounter: Payer: Self-pay | Admitting: Internal Medicine

## 2016-01-01 VITALS — BP 122/76 | HR 60 | Temp 97.8°F | Ht 68.0 in | Wt 179.4 lb

## 2016-01-01 DIAGNOSIS — Z0000 Encounter for general adult medical examination without abnormal findings: Secondary | ICD-10-CM | POA: Diagnosis not present

## 2016-01-01 DIAGNOSIS — Z23 Encounter for immunization: Secondary | ICD-10-CM

## 2016-01-01 DIAGNOSIS — Z09 Encounter for follow-up examination after completed treatment for conditions other than malignant neoplasm: Secondary | ICD-10-CM

## 2016-01-01 LAB — CBC WITH DIFFERENTIAL/PLATELET
BASOS PCT: 0.3 % (ref 0.0–3.0)
Basophils Absolute: 0 10*3/uL (ref 0.0–0.1)
EOS ABS: 0.1 10*3/uL (ref 0.0–0.7)
EOS PCT: 0.8 % (ref 0.0–5.0)
HEMATOCRIT: 47.4 % (ref 39.0–52.0)
HEMOGLOBIN: 16.5 g/dL (ref 13.0–17.0)
LYMPHS PCT: 30.5 % (ref 12.0–46.0)
Lymphs Abs: 2 10*3/uL (ref 0.7–4.0)
MCHC: 34.8 g/dL (ref 30.0–36.0)
MCV: 94 fl (ref 78.0–100.0)
Monocytes Absolute: 0.5 10*3/uL (ref 0.1–1.0)
Monocytes Relative: 7.3 % (ref 3.0–12.0)
Neutro Abs: 4 10*3/uL (ref 1.4–7.7)
Neutrophils Relative %: 61.1 % (ref 43.0–77.0)
PLATELETS: 191 10*3/uL (ref 150.0–400.0)
RBC: 5.04 Mil/uL (ref 4.22–5.81)
RDW: 13.5 % (ref 11.5–15.5)
WBC: 6.5 10*3/uL (ref 4.0–10.5)

## 2016-01-01 LAB — BASIC METABOLIC PANEL
BUN: 17 mg/dL (ref 6–23)
CHLORIDE: 106 meq/L (ref 96–112)
CO2: 30 mEq/L (ref 19–32)
Calcium: 9.2 mg/dL (ref 8.4–10.5)
Creatinine, Ser: 1.02 mg/dL (ref 0.40–1.50)
GFR: 80.04 mL/min (ref >60.00)
Glucose, Bld: 103 mg/dL — ABNORMAL HIGH (ref 70–99)
POTASSIUM: 3.9 meq/L (ref 3.5–5.1)
Sodium: 142 mEq/L (ref 135–145)

## 2016-01-01 LAB — LIPID PANEL
CHOLESTEROL: 143 mg/dL (ref 0–200)
HDL: 31.5 mg/dL — ABNORMAL LOW (ref >39.00)
LDL CALC: 79 mg/dL (ref 0–99)
NonHDL: 111.73
TRIGLYCERIDES: 166 mg/dL — AB (ref 0.0–149.0)
Total CHOL/HDL Ratio: 5
VLDL: 33.2 mg/dL (ref 0.0–40.0)

## 2016-01-01 LAB — VITAMIN D 25 HYDROXY (VIT D DEFICIENCY, FRACTURES): VITD: 9.82 ng/mL — ABNORMAL LOW (ref 30.00–100.00)

## 2016-01-01 NOTE — Patient Instructions (Signed)
GO TO THE LAB : Get the blood work    GO TO THE FRONT DESK Schedule a complete physical exam to be done in 1 year Please be fasting   Vit D OTC between 600 and 1200 units qd

## 2016-01-01 NOTE — Progress Notes (Signed)
Subjective:    Patient ID: Bruce Woods, male    DOB: 12-Aug-1961, 55 y.o.   MRN: [id number]  DOS:  01/01/2016 Type of visit - description : CPX Interval history:  No concerns  Review of Systems  Constitutional: No fever. No chills. No unexplained wt changes. No unusual sweats  HEENT: No dental problems, no ear discharge, no facial swelling, no voice changes. No eye discharge, no eye  redness , no  intolerance to light   Respiratory: No wheezing , no  difficulty breathing. No cough , no mucus production  Cardiovascular: No CP, no leg swelling , no  Palpitations  GI: no nausea, no vomiting, no diarrhea , no  abdominal pain.  No blood in the stools. No dysphagia, no odynophagia    Endocrine: No polyphagia, no polyuria , no polydipsia  GU: No dysuria, gross hematuria, difficulty urinating. No urinary urgency, no frequency.  Musculoskeletal: No joint swellings or unusual aches or pains  Skin: No change in the color of the skin, palor , no  Rash  Allergic, immunologic: No environmental allergies , no  food allergies  Neurological: No dizziness no  syncope. No headaches. No diplopia, no slurred, no slurred speech, no motor deficits, no facial  Numbness  Hematological: No enlarged lymph nodes, no easy bruising , no unusual bleedings  Psychiatry: No suicidal ideas, no hallucinations, no beavior problems, no confusion.  No unusual/severe anxiety, no depression  Past Medical History  Diagnosis Date  . DEPRESSION   . HIV DISEASE   . Sarcoidosis ([place]) 2000    question of    Past Surgical History  Procedure Laterality Date  . Lung surgery  2000    vats  . Tonsillectomy and adenoidectomy      Social History   Social History  . Marital Status: Married    Spouse Name: N/A  . Number of Children: 2  . Years of Education: N/A   Occupational History  . 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 Smoking status: Former Research scientist (life sciences)  . Smokeless tobacco: Never  Used  . Alcohol Use: 0.6 oz/week    1 Standard drinks or equivalent per week     Comment: socially   . Drug Use: No  . Sexual Activity: Yes     Comment: declined condoms   Other Topics Concern  . Not on file   Social History Narrative   1 daughter married        Family History  Problem Relation Age of Onset  . Hyperlipidemia Mother   . Hypertension Mother   . Sudden death Neg Hx   . Heart attack Neg Hx   . Diabetes Neg Hx   . Cancer Mother     glioblastoma  . Colon cancer Neg Hx   . Prostate cancer Neg Hx        Medication List       This list is accurate as of: 01/01/16  5:32 PM.  Always use your most recent med list.               citalopram 20 MG tablet  Commonly known as:  CELEXA  TAKE 1 TABLET BY MOUTH ONCE DAILY     efavirenz-emtricitabine-tenofovir 600-200-300 MG tablet  Commonly known as:  ATRIPLA  Take 1 tablet by mouth at bedtime.     methocarbamol 500 MG tablet  Commonly known as:  ROBAXIN  Take 500 mg by mouth as needed.  multivitamin tablet  Take 1 tablet by mouth daily.     sildenafil 100 MG tablet  Commonly known as:  VIAGRA  Take 0.5-1 tablets (50-100 mg total) by mouth daily as needed for erectile dysfunction.           Objective:   Physical Exam BP 122/76 mmHg  Pulse 60  Temp(Src) 97.8 F (36.6 C) (Oral)  Ht 5\' 8"  (1.727 m)  Wt 179 lb 6 oz (81.364 kg)  BMI 27.28 kg/m2  SpO2 97% General:   Well developed, well nourished . NAD.  Neck:  No thyromegaly HEENT:  Normocephalic . Face symmetric, atraumatic Lungs:  CTA B Normal respiratory effort, no intercostal retractions, no accessory muscle use. Heart: RRR,  no murmur.  No pretibial edema bilaterally  Abdomen:  Not distended, soft, non-tender. No rebound or rigidity.   Skin: Exposed areas without rash. Not pale. Not jaundice Neurologic:  alert & oriented X3.  Speech normal, gait appropriate for age and unassisted Strength symmetric and appropriate for age.    Psych: Cognition and judgment appear intact.  Cooperative with normal attention span and concentration.  Behavior appropriate. No anxious or depressed appearing.    Assessment & Plan:   Assessment: HIV + Depression ED Vitamin D deficiency ?sarcoidosis dx 2000  PLAN Depression: Well-controlled, on citalopram ED: Uses Viagra successfully from time to time. Vitamin D deficiency: Not on supplements, recommend daily supplements OTC, check labs RTC one year

## 2016-01-01 NOTE — Assessment & Plan Note (Addendum)
Immunizations--  Updated except for prevnar DRE and PSA 2016--- normal cscope normal at age 55 [place]) Has a healthy life style

## 2016-01-01 NOTE — Progress Notes (Signed)
Pre visit review using our clinic review tool, if applicable. No additional management support is needed unless otherwise documented below in the visit note. 

## 2016-01-01 NOTE — Assessment & Plan Note (Signed)
Depression: Well-controlled, on citalopram ED: Uses Viagra successfully from time to time. Vitamin D deficiency: Not on supplements, recommend daily supplements OTC, check labs RTC one year

## 2016-01-05 MED ORDER — VITAMIN D (ERGOCALCIFEROL) 1.25 MG (50000 UNIT) PO CAPS: 50000.0000 [IU] | ORAL_CAPSULE | ORAL | Status: DC

## 2016-01-05 NOTE — Addendum Note (Signed)
Addended byDamita Dunnings D on: 01/05/2016 06:57 AM   Modules accepted: Orders

## 2016-01-12 MED FILL — VIT D2 1.25 MG (50,000 UNIT: 1.25 MG | 84 days supply | Qty: 12 | Fill #0

## 2016-01-14 MED FILL — ATRIPLA 600-200-300 MG TABS: 600-200-300 | 60 days supply | Qty: 60 | Fill #2

## 2016-02-01 ENCOUNTER — Other Ambulatory Visit: Payer: Self-pay | Admitting: Infectious Diseases

## 2016-02-01 DIAGNOSIS — F32A Depression, unspecified: Secondary | ICD-10-CM

## 2016-02-01 DIAGNOSIS — F329 Major depressive disorder, single episode, unspecified: Secondary | ICD-10-CM

## 2016-02-02 DIAGNOSIS — L821 Other seborrheic keratosis: Secondary | ICD-10-CM | POA: Diagnosis not present

## 2016-02-02 DIAGNOSIS — D485 Neoplasm of uncertain behavior of skin: Secondary | ICD-10-CM | POA: Diagnosis not present

## 2016-02-02 MED FILL — CITALOPRAM HBR 20 MG TABLET: 20 | 90 days supply | Qty: 90 | Fill #0

## 2016-03-24 MED FILL — ATRIPLA 600-200-300 MG TABS: 600-200-300 | 60 days supply | Qty: 60 | Fill #2

## 2016-03-31 ENCOUNTER — Other Ambulatory Visit: Payer: 59

## 2016-03-31 DIAGNOSIS — B20 Human immunodeficiency virus [HIV] disease: Secondary | ICD-10-CM | POA: Diagnosis not present

## 2016-03-31 DIAGNOSIS — Z113 Encounter for screening for infections with a predominantly sexual mode of transmission: Secondary | ICD-10-CM | POA: Diagnosis not present

## 2016-04-01 LAB — T-HELPER CELL (CD4) - (RCID CLINIC ONLY)
CD4 % Helper T Cell: 29 % — ABNORMAL LOW (ref 33–55)
CD4 T CELL ABS: 640 /uL (ref 400–2700)

## 2016-04-01 LAB — RPR

## 2016-04-04 LAB — HIV-1 RNA QUANT-NO REFLEX-BLD
HIV 1 RNA QUANT: 67 {copies}/mL — AB (ref <20)
HIV-1 RNA QUANT, LOG: 1.83 {Log_copies}/mL — AB (ref <1.30)

## 2016-04-12 ENCOUNTER — Ambulatory Visit (INDEPENDENT_AMBULATORY_CARE_PROVIDER_SITE_OTHER): Payer: 59 | Admitting: Internal Medicine

## 2016-04-12 ENCOUNTER — Encounter: Payer: Self-pay | Admitting: Internal Medicine

## 2016-04-12 DIAGNOSIS — B20 Human immunodeficiency virus [HIV] disease: Secondary | ICD-10-CM

## 2016-04-12 NOTE — Progress Notes (Signed)
Patient Active Problem List   Diagnosis Date Noted  . DEPRESSION 01/03/2007    Priority: High  . Human immunodeficiency virus (HIV) disease ([place]) 08/18/2006    Priority: High  . PCP NOTES >>>>>>>>>>>>>>>>>>>>>>>>> 01/01/2016  . Insomnia 11/19/2013  . Annual physical exam 10/01/2013  . Right hip pain 05/25/2012  . Fatigue, low vit D 08/19/2011  . Erectile dysfunction 08/19/2011  . 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 SARCOIDOSIS 08/18/2006    Patient's Medications  New Prescriptions   No medications on file  Previous Medications   CITALOPRAM (CELEXA) 20 MG TABLET    TAKE 1 TABLET BY MOUTH ONCE DAILY   EFAVIRENZ-EMTRICITABINE-TENOFOVIR (ATRIPLA) 600-200-300 MG TABLET    Take 1 tablet by mouth at bedtime.   METHOCARBAMOL (ROBAXIN) 500 MG TABLET    Take 500 mg by mouth as needed.   MULTIPLE VITAMIN (MULTIVITAMIN) TABLET    Take 1 tablet by mouth daily.   SILDENAFIL (VIAGRA) 100 MG TABLET    Take 0.5-1 tablets (50-100 mg total) by mouth daily as needed for erectile dysfunction.   VITAMIN D, ERGOCALCIFEROL, (DRISDOL) 50000 UNITS CAPS CAPSULE    Take 1 capsule (50,000 Units total) by mouth every 7 (seven) days.  Modified Medications   No medications on file  Discontinued Medications   No medications on file    Subjective: Bruce Woods is in for his routine HIV follow-up visit. He states that he is doing well and has no health concerns or complaints since his last visit. 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 He works Saturday, Sunday and Monday nights and then has 4 days off. He continues to take citalopram and states his depression is under good control. As usual, he has no problem obtaining, taking or tolerating Atripla and never misses a dose.   Review of Systems: Review of Systems  Constitutional: Negative for fever, chills, weight loss and malaise/fatigue.  Respiratory: Negative for cough and shortness of breath.   Cardiovascular: Negative for chest pain.   Gastrointestinal: Negative for nausea, vomiting, abdominal pain and diarrhea.  Psychiatric/Behavioral: Negative for depression and substance abuse. The patient is not nervous/anxious and does not have insomnia.     Past Medical History  Diagnosis Date  . DEPRESSION   . HIV DISEASE   . Sarcoidosis ([place]) 2000    question of    Social History  Substance Use Topics  . Smoking status: Former Research scientist (life sciences)  . Smokeless tobacco: Never Used  . Alcohol Use: 0.6 oz/week    1 Standard drinks or equivalent per week     Comment: socially     Family History  Problem Relation Age of Onset  . Hyperlipidemia Mother   . Hypertension Mother   . Sudden death Neg Hx   . Heart attack Neg Hx   . Diabetes Neg Hx   . Cancer Mother     glioblastoma  . Colon cancer Neg Hx   . Prostate cancer Neg Hx     Allergies  Allergen Reactions  . Celebrex [Celecoxib] Nausea Only  . Doxycycline   . Morphine   . Wellbutrin [Bupropion] Hives    Objective:  Filed Vitals:   04/12/16 1119  Height: 5\' 10"  (1.778 m)  Weight: 177 lb (80.287 kg)   Body mass index is 25.4 kg/(m^2).  Physical Exam  Constitutional: He is oriented to person, place, and time.  He is in good spirits as usual.  HENT:  Mouth/Throat: No oropharyngeal exudate.  Eyes: Conjunctivae are normal.  Cardiovascular: Normal rate and regular rhythm.   No murmur heard. Pulmonary/Chest: Breath sounds normal. He has no wheezes. He has no rales.  Abdominal: Soft. There is no tenderness.  Neurological: He is alert and oriented to person, place, and time.  Skin: No rash noted.  Psychiatric: Mood and affect normal.    Lab Results Lab Results  Component Value Date   WBC 6.5 01/01/2016   HGB 16.5 01/01/2016   HCT 47.4 01/01/2016   MCV 94.0 01/01/2016   PLT 191.0 01/01/2016    Lab Results  Component Value Date   CREATININE 1.02 01/01/2016   BUN 17 01/01/2016   NA 142 01/01/2016   K 3.9 01/01/2016   CL 106 01/01/2016   CO2 30  01/01/2016    Lab Results  Component Value Date   ALT 16 12/29/2014   AST 17 12/29/2014   ALKPHOS 78 12/29/2014   BILITOT 0.5 12/29/2014    Lab Results  Component Value Date   CHOL 143 01/01/2016   HDL 31.50* 01/01/2016   LDLCALC 79 01/01/2016   TRIG 166.0* 01/01/2016   CHOLHDL 5 01/01/2016    Lab Results HIV 1 RNA QUANT (copies/mL)  Date Value  03/31/2016 67*  08/04/2015 <20  02/02/2015 <20   CD4 T CELL ABS (/uL)  Date Value  03/31/2016 640  08/04/2015 470  02/02/2015 430      Problem List Items Addressed This Visit      High   Human immunodeficiency virus (HIV) disease ([place])    His viral load is just barely detectable but stable over time. I talked to him about newer, safer alternatives to Atripla. Recommended switching to Genvoya. He is willing to do this but would like to wait until his 6 month follow-up visit and repeat lab work in December. I told him that I would also consider switching him over to annual visits given the long-term stability of his infection and excellent adherence.      Relevant Orders   T-helper cell (CD4)- ([facility] clinic only)   HIV 1 RNA quant-no reflex-bld   CBC   Comprehensive metabolic panel   Lipid panel   RPR        Michel Bickers, MD [facility] for [place] [phone] pager   [phone] cell 04/12/2016, 12:10 PM

## 2016-04-12 NOTE — Assessment & Plan Note (Signed)
His viral load is just barely detectable but stable over time. I talked to him about newer, safer alternatives to Atripla. Recommended switching to Genvoya. He is willing to do this but would like to wait until his 6 month follow-up visit and repeat lab work in December. I told him that I would also consider switching him over to annual visits given the long-term stability of his infection and excellent adherence.

## 2016-05-05 MED FILL — CITALOPRAM HBR 20 MG TABLET: 20 | 90 days supply | Qty: 90 | Fill #1

## 2016-05-19 ENCOUNTER — Other Ambulatory Visit: Payer: Self-pay | Admitting: Internal Medicine

## 2016-05-19 DIAGNOSIS — B20 Human immunodeficiency virus [HIV] disease: Secondary | ICD-10-CM

## 2016-05-19 MED FILL — ATRIPLA 600-200-300 MG TABS: 600-200-300 | 60 days supply | Qty: 60 | Fill #0

## 2016-07-13 MED FILL — ATRIPLA 600-200-300 MG TABS: 600-200-300 | 60 days supply | Qty: 60 | Fill #1

## 2016-07-22 ENCOUNTER — Other Ambulatory Visit: Payer: Self-pay | Admitting: Internal Medicine

## 2016-07-22 DIAGNOSIS — F329 Major depressive disorder, single episode, unspecified: Secondary | ICD-10-CM

## 2016-07-22 DIAGNOSIS — F32A Depression, unspecified: Secondary | ICD-10-CM

## 2016-07-22 MED FILL — CITALOPRAM HBR 20 MG TABLET: 20 | 90 days supply | Qty: 90 | Fill #0

## 2016-09-28 ENCOUNTER — Other Ambulatory Visit: Payer: 59

## 2016-09-28 DIAGNOSIS — B20 Human immunodeficiency virus [HIV] disease: Secondary | ICD-10-CM | POA: Diagnosis not present

## 2016-09-28 LAB — COMPREHENSIVE METABOLIC PANEL
ALBUMIN: 4.3 g/dL (ref 3.6–5.1)
ALT: 17 U/L (ref 9–46)
AST: 17 U/L (ref 10–35)
Alkaline Phosphatase: 68 U/L (ref 40–115)
BUN: 16 mg/dL (ref 7–25)
CHLORIDE: 103 mmol/L (ref 98–110)
CO2: 28 mmol/L (ref 20–31)
CREATININE: 1.13 mg/dL (ref 0.70–1.33)
Calcium: 9.4 mg/dL (ref 8.6–10.3)
Glucose, Bld: 96 mg/dL (ref 65–99)
POTASSIUM: 4.1 mmol/L (ref 3.5–5.3)
SODIUM: 139 mmol/L (ref 135–146)
Total Bilirubin: 0.5 mg/dL (ref 0.2–1.2)
Total Protein: 6.9 g/dL (ref 6.1–8.1)

## 2016-09-28 LAB — LIPID PANEL
CHOL/HDL RATIO: 5.4 ratio — AB (ref <5.0)
CHOLESTEROL: 151 mg/dL (ref <200)
HDL: 28 mg/dL — AB (ref >40)
LDL CALC: 89 mg/dL (ref <100)
TRIGLYCERIDES: 168 mg/dL — AB (ref <150)
VLDL: 34 mg/dL — AB (ref <30)

## 2016-09-28 LAB — CBC
HCT: 48 % (ref 38.5–50.0)
HEMOGLOBIN: 16.4 g/dL (ref 13.2–17.1)
MCH: 32.9 pg (ref 27.0–33.0)
MCHC: 34.2 g/dL (ref 32.0–36.0)
MCV: 96.4 fL (ref 80.0–100.0)
MPV: 10.6 fL (ref 7.5–12.5)
Platelets: 170 10*3/uL (ref 140–400)
RBC: 4.98 MIL/uL (ref 4.20–5.80)
RDW: 13.9 % (ref 11.0–15.0)
WBC: 6.1 10*3/uL (ref 3.8–10.8)

## 2016-09-29 LAB — HIV-1 RNA QUANT-NO REFLEX-BLD
HIV 1 RNA QUANT: 42 {copies}/mL — AB (ref <20)
HIV-1 RNA QUANT, LOG: 1.62 {Log_copies}/mL — AB (ref <1.30)

## 2016-09-29 LAB — RPR

## 2016-09-29 LAB — T-HELPER CELL (CD4) - (RCID CLINIC ONLY)
CD4 % Helper T Cell: 29 % — ABNORMAL LOW (ref 33–55)
CD4 T Cell Abs: 630 /uL (ref 400–2700)

## 2016-10-12 ENCOUNTER — Ambulatory Visit (INDEPENDENT_AMBULATORY_CARE_PROVIDER_SITE_OTHER): Payer: 59 | Admitting: Internal Medicine

## 2016-10-12 ENCOUNTER — Encounter: Payer: Self-pay | Admitting: Internal Medicine

## 2016-10-12 DIAGNOSIS — B20 Human immunodeficiency virus [HIV] disease: Secondary | ICD-10-CM | POA: Diagnosis not present

## 2016-10-12 DIAGNOSIS — F3342 Major depressive disorder, recurrent, in full remission: Secondary | ICD-10-CM | POA: Diagnosis not present

## 2016-10-12 DIAGNOSIS — Z23 Encounter for immunization: Secondary | ICD-10-CM | POA: Diagnosis not present

## 2016-10-12 MED ORDER — ELVITEG-COBIC-EMTRICIT-TENOFAF 150-150-200-10 MG PO TABS: 1.0000 | ORAL_TABLET | Freq: Every day | ORAL | 11 refills | Status: DC

## 2016-10-12 MED ORDER — CITALOPRAM HYDROBROMIDE 20 MG PO TABS: 20.0000 mg | ORAL_TABLET | Freq: Every day | ORAL | 3 refills | Status: DC

## 2016-10-12 MED FILL — GENVOYA TABLET: 150-150-200 | 30 days supply | Qty: 30 | Fill #0

## 2016-10-12 MED FILL — CITALOPRAM HBR 20 MG TABLET: 20 | 90 days supply | Qty: 90 | Fill #0

## 2016-10-12 NOTE — Assessment & Plan Note (Signed)
I would like to see how he feels off of Atripla since it can cause or contribute to depression. I have agreed to refill his citalopram.

## 2016-10-12 NOTE — Progress Notes (Signed)
Patient Active Problem List   Diagnosis Date Noted  . DEPRESSION 01/03/2007    Priority: High  . Human immunodeficiency virus (HIV) disease ([place]) 08/18/2006    Priority: High  . PCP NOTES >>>>>>>>>>>>>>>>>>>>>>>>> 01/01/2016  . Insomnia 11/19/2013  . Annual physical exam 10/01/2013  . Right hip pain 05/25/2012  . Fatigue, low vit D 08/19/2011  . Erectile dysfunction 08/19/2011  . 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 SARCOIDOSIS 08/18/2006    Patient's Medications  New Prescriptions   ELVITEGRAVIR-COBICISTAT-EMTRICITABINE-TENOFOVIR (GENVOYA) 150-150-200-10 MG TABS TABLET    Take 1 tablet by mouth daily with breakfast.  Previous Medications   METHOCARBAMOL (ROBAXIN) 500 MG TABLET    Take 500 mg by mouth as needed.   MULTIPLE VITAMIN (MULTIVITAMIN) TABLET    Take 1 tablet by mouth daily.   SILDENAFIL (VIAGRA) 100 MG TABLET    Take 0.5-1 tablets (50-100 mg total) by mouth daily as needed for erectile dysfunction.   VITAMIN D, ERGOCALCIFEROL, (DRISDOL) 50000 UNITS CAPS CAPSULE    Take 1 capsule (50,000 Units total) by mouth every 7 (seven) days.  Modified Medications   Modified Medication Previous Medication   CITALOPRAM (CELEXA) 20 MG TABLET citalopram (CELEXA) 20 MG tablet      Take 1 tablet (20 mg total) by mouth daily.    TAKE 1 TABLET BY MOUTH ONCE DAILY  Discontinued Medications   ATRIPLA 600-200-300 MG TABLET    TAKE 1 TABLET BY MOUTH AT BEDTIME   EFAVIRENZ-EMTRICITABINE-TENOFOVIR (ATRIPLA) 600-200-300 MG TABLET    Take 1 tablet by mouth at bedtime.    Subjective: Bruce Woods is in for his routine HIV follow-up visit. As usual he never misses a single dose of his Atripla. He believes that he has been a little more withdrawn and introverted over the past year but is not feeling depressed. 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 Recently got a letter stating that his hepatitis B surface antibody was negative. He does recall being  vaccinated many years ago.   Review of Systems: Review of Systems  Constitutional: Negative for chills, diaphoresis, fever, malaise/fatigue and weight loss.  HENT: Positive for congestion. Negative for sore throat.   Respiratory: Negative for cough, sputum production and shortness of breath.   Cardiovascular: Negative for chest pain.  Gastrointestinal: Negative for abdominal pain, diarrhea, heartburn, nausea and vomiting.  Genitourinary: Negative for dysuria and frequency.  Musculoskeletal: Negative for joint pain and myalgias.  Skin: Negative for rash.  Neurological: Negative for dizziness and headaches.  Psychiatric/Behavioral: Negative for depression and substance abuse. The patient is not nervous/anxious.     Past Medical History:  Diagnosis Date  . DEPRESSION   . HIV DISEASE   . Sarcoidosis ([place]) 2000   question of    Social History  Substance Use Topics  . Smoking status: Former Research scientist (life sciences)  . Smokeless tobacco: Never Used  . Alcohol use 0.6 oz/week    1 Standard drinks or equivalent per week     Comment: socially     Family History  Problem Relation Age of Onset  . Hyperlipidemia Mother   . Hypertension Mother   . Sudden death Neg Hx   . Heart attack Neg Hx   . Diabetes Neg Hx   . Cancer Mother     glioblastoma  . Colon cancer Neg Hx   . Prostate cancer Neg Hx     Allergies  Allergen Reactions  .  Celebrex [Celecoxib] Nausea Only  . Doxycycline   . Morphine   . Wellbutrin [Bupropion] Hives    Objective:  Vitals:   10/12/16 1559  BP: (!) 146/91  Pulse: 80  Temp: 97.7 F (36.5 C)  TempSrc: Oral  Weight: 180 lb (81.6 kg)  Height: 5\' 8"  (1.727 m)   Body mass index is 27.37 kg/m.  Physical Exam  Constitutional: He is oriented to person, place, and time.  HENT:  Mouth/Throat: No oropharyngeal exudate.  Eyes: Conjunctivae are normal.  Cardiovascular: Normal rate and regular rhythm.   No murmur heard. Pulmonary/Chest: Breath sounds normal.    Abdominal: Soft. He exhibits no mass. There is no tenderness.  Musculoskeletal: Normal range of motion.  Neurological: He is alert and oriented to person, place, and time.  Skin: No rash noted.  Psychiatric: Mood and affect normal.    Lab Results Lab Results  Component Value Date   WBC 6.1 09/28/2016   HGB 16.4 09/28/2016   HCT 48.0 09/28/2016   MCV 96.4 09/28/2016   PLT 170 09/28/2016    Lab Results  Component Value Date   CREATININE 1.13 09/28/2016   BUN 16 09/28/2016   NA 139 09/28/2016   K 4.1 09/28/2016   CL 103 09/28/2016   CO2 28 09/28/2016    Lab Results  Component Value Date   ALT 17 09/28/2016   AST 17 09/28/2016   ALKPHOS 68 09/28/2016   BILITOT 0.5 09/28/2016    Lab Results  Component Value Date   CHOL 151 09/28/2016   HDL 28 (L) 09/28/2016   LDLCALC 89 09/28/2016   TRIG 168 (H) 09/28/2016   CHOLHDL 5.4 (H) 09/28/2016   HIV 1 RNA Quant (copies/mL)  Date Value  09/28/2016 42 (H)  03/31/2016 67 (H)  08/04/2015 <20   CD4 T Cell Abs (/uL)  Date Value  09/28/2016 630  03/31/2016 640  08/04/2015 470     Problem List Items Addressed This Visit      High   Human immunodeficiency virus (HIV) disease ([place])   Relevant Medications   elvitegravir-cobicistat-emtricitabine-tenofovir (GENVOYA) 150-150-200-10 MG TABS tablet   Other Relevant Orders   T-helper cell (CD4)- ([facility] clinic only)   HIV 1 RNA quant-no reflex-bld   CBC   Comprehensive metabolic panel   Lipid panel   RPR    Other Visit Diagnoses    Recurrent major depressive disorder, in full remission ([place])       Relevant Medications   citalopram (CELEXA) 20 MG tablet        Michel Bickers, MD [facility] for [facility] [place] [facility] [phone] pager   [phone] cell 10/12/2016, 4:35 PM

## 2016-10-12 NOTE — Assessment & Plan Note (Signed)
His infection is under good long-term control. I talked him again about options of switching to a newer, safer antiretroviral regimen. He is willing to switch to [facility]. He will take it each evening around 6 PM with dinner. I will have him get repeat labs in 6 months. If they are okay he will follow-up in one year.

## 2016-11-04 ENCOUNTER — Other Ambulatory Visit: Payer: Self-pay | Admitting: Internal Medicine

## 2016-11-04 MED FILL — SILDENAFIL 100 MG TABLET: 100 | 30 days supply | Qty: 6 | Fill #0

## 2016-11-04 MED FILL — GENVOYA TABLET: 150-150-200 | 30 days supply | Qty: 30 | Fill #1

## 2016-11-16 ENCOUNTER — Ambulatory Visit: Payer: 59

## 2016-12-06 ENCOUNTER — Ambulatory Visit (INDEPENDENT_AMBULATORY_CARE_PROVIDER_SITE_OTHER): Payer: 59 | Admitting: *Deleted

## 2016-12-06 DIAGNOSIS — B20 Human immunodeficiency virus [HIV] disease: Secondary | ICD-10-CM

## 2016-12-06 DIAGNOSIS — Z23 Encounter for immunization: Secondary | ICD-10-CM | POA: Diagnosis not present

## 2016-12-08 MED FILL — GENVOYA TABLET: 150-150-200 | 30 days supply | Qty: 30 | Fill #2

## 2017-01-02 ENCOUNTER — Encounter: Payer: Self-pay | Admitting: Internal Medicine

## 2017-01-02 ENCOUNTER — Ambulatory Visit (INDEPENDENT_AMBULATORY_CARE_PROVIDER_SITE_OTHER): Payer: 59 | Admitting: Internal Medicine

## 2017-01-02 VITALS — BP 126/74 | HR 65 | Temp 98.2°F | Resp 14 | Ht 68.0 in | Wt 187.5 lb

## 2017-01-02 DIAGNOSIS — E559 Vitamin D deficiency, unspecified: Secondary | ICD-10-CM | POA: Diagnosis not present

## 2017-01-02 DIAGNOSIS — Z23 Encounter for immunization: Secondary | ICD-10-CM

## 2017-01-02 DIAGNOSIS — Z0000 Encounter for general adult medical examination without abnormal findings: Secondary | ICD-10-CM | POA: Diagnosis not present

## 2017-01-02 LAB — LIPID PANEL
Cholesterol: 163 mg/dL (ref 0–200)
HDL: 30.5 mg/dL — AB (ref >39.00)
LDL Cholesterol: 105 mg/dL — ABNORMAL HIGH (ref 0–99)
NONHDL: 132.39
TRIGLYCERIDES: 136 mg/dL (ref 0.0–149.0)
Total CHOL/HDL Ratio: 5
VLDL: 27.2 mg/dL (ref 0.0–40.0)

## 2017-01-02 LAB — VITAMIN D 25 HYDROXY (VIT D DEFICIENCY, FRACTURES): VITD: 12.8 ng/mL — ABNORMAL LOW (ref 30.00–100.00)

## 2017-01-02 LAB — TSH: TSH: 2.36 u[IU]/mL (ref 0.35–4.50)

## 2017-01-02 NOTE — Patient Instructions (Signed)
GO TO THE LAB : Get the blood work     GO TO THE FRONT DESK Schedule your next appointment for a  physical exam in one year  

## 2017-01-02 NOTE — Progress Notes (Signed)
Pre visit review using our clinic review tool, if applicable. No additional management support is needed unless otherwise documented below in the visit note. 

## 2017-01-02 NOTE — Progress Notes (Signed)
Subjective:    Patient ID: Bruce Woods, male    DOB: [birth year], 56 y.o.   MRN: [id number]  DOS:  01/02/2017 Type of visit - description : cpx Interval history: No major concerns. Is working long hours, sleeping very little. Not time to exercise but he is very active at work  Review of Systems  A 14 point review of systems is negative   Past Medical History:  Diagnosis Date  . DEPRESSION   . HIV DISEASE   . Sarcoidosis ([place]) 2000   question of    Past Surgical History:  Procedure Laterality Date  . LUNG SURGERY  2000   vats  . TONSILLECTOMY AND ADENOIDECTOMY      Social History   Social History  . Marital status: Married    Spouse name: N/A  . Number of children: 2  . Years of education: N/A   Occupational History  . 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 Smoking status: Former Research scientist (life sciences)  . Smokeless tobacco: Never Used  . Alcohol use 0.6 oz/week    1 Standard drinks or equivalent per week     Comment: socially   . Drug use: No  . Sexual activity: Yes     Comment: declined condoms   Other Topics Concern  . Not on file   Social History Narrative   [name]   1 daughter married        Family History  Problem Relation Age of Onset  . Hyperlipidemia Mother   . Hypertension Mother   . Cancer Mother     glioblastoma  . Sudden death Neg Hx   . Heart attack Neg Hx   . Diabetes Neg Hx   . Colon cancer Neg Hx   . Prostate cancer Neg Hx      Allergies as of 01/02/2017      Reactions   Morphine Other (See Comments)   Resp distress, rash, tachycardia   Celebrex [celecoxib] Nausea Only   Doxycycline Other (See Comments)   Elevated LFT's   Wellbutrin [bupropion] Hives      Medication List       Accurate as of 01/02/17 11:59 PM. Always use your most recent med list.          citalopram 20 MG tablet Commonly known as:  CELEXA Take 1 tablet (20 mg total) by mouth daily.     elvitegravir-cobicistat-emtricitabine-tenofovir 150-150-200-10 MG Tabs tablet Commonly known as:  GENVOYA Take 1 tablet by mouth daily with breakfast.   methocarbamol 500 MG tablet Commonly known as:  ROBAXIN Take 500 mg by mouth as needed.   VIAGRA 100 MG tablet Generic drug:  sildenafil TAKE 1/2-1 TABLET (50-100 MG TOTAL) BY MOUTH DAILY AS NEEDED FOR ERECTILE DYSFUNCTION.          Objective:   Physical Exam BP 126/74 (BP Location: Left Arm, Patient Position: Sitting, Cuff Size: Normal)   Pulse 65   Temp 98.2 F (36.8 C) (Oral)   Resp 14   Ht 5\' 8"  (1.727 m)   Wt 187 lb 8 oz (85 kg)   SpO2 97%   BMI 28.51 kg/m   General:   Well developed, well nourished . NAD.  Neck: No  thyromegaly  HEENT:  Normocephalic . Face symmetric, atraumatic Lungs:  CTA B Normal respiratory effort, no intercostal retractions, no accessory muscle use. Heart: RRR,  no murmur.  No pretibial edema bilaterally  Abdomen:  Not distended, soft, non-tender. No rebound or rigidity.   Skin: Exposed areas without rash. Not pale. Not jaundice Neurologic:  alert & oriented X3.  Speech normal, gait appropriate for age and unassisted Strength symmetric and appropriate for age.  Psych: Cognition and judgment appear intact.  Cooperative with normal attention span and concentration.  Behavior appropriate. No anxious or depressed appearing.    Assessment & Plan:   Assessment: HIV + Depression ED Vitamin D deficiency ?sarcoidosis dx 2000  PLAN HIV: Well-controlled long-term, recently changed to a different therapy. Depression: On citalopram, well-controlled Vitamin D deficiency: Currently on no supplements, check labs. RTC one year

## 2017-01-02 NOTE — Assessment & Plan Note (Addendum)
Immunizations--  Td booster today DRE and PSA 2016--- normal cscope normal at age 56 [place]) Counseling about diet and exercise although has been  very busy lately , unable to eat healthier and exercise routinely. Labs: FLP, TSH, vitamin D

## 2017-01-03 MED ORDER — VITAMIN D (ERGOCALCIFEROL) 1.25 MG (50000 UNIT) PO CAPS: 50000.0000 [IU] | ORAL_CAPSULE | ORAL | 0 refills | Status: DC

## 2017-01-03 MED FILL — VIT D2 1.25 MG (50,000 UNIT: 1.25 MG | 84 days supply | Qty: 12 | Fill #0

## 2017-01-03 NOTE — Assessment & Plan Note (Signed)
HIV: Well-controlled long-term, recently changed to a different therapy. Depression: On citalopram, well-controlled Vitamin D deficiency: Currently on no supplements, check labs. RTC one year

## 2017-01-03 NOTE — Addendum Note (Signed)
Addended byDamita Dunnings D on: 01/03/2017 01:51 PM   Modules accepted: Orders

## 2017-01-09 MED FILL — GENVOYA TABLET: 150-150-200 | 30 days supply | Qty: 30 | Fill #3

## 2017-02-01 MED FILL — GENVOYA TABLET: 150-150-200 | 30 days supply | Qty: 30 | Fill #4

## 2017-02-01 MED FILL — CITALOPRAM HBR 20 MG TABLET: 20 | 90 days supply | Qty: 90 | Fill #1

## 2017-03-08 MED FILL — GENVOYA TABLET: 150-150-200 | 30 days supply | Qty: 30 | Fill #5

## 2017-04-06 MED FILL — GENVOYA TABLET: 150-150-200 | 30 days supply | Qty: 30 | Fill #6

## 2017-04-12 ENCOUNTER — Other Ambulatory Visit: Payer: 59

## 2017-04-19 ENCOUNTER — Ambulatory Visit (INDEPENDENT_AMBULATORY_CARE_PROVIDER_SITE_OTHER): Payer: 59 | Admitting: *Deleted

## 2017-04-19 ENCOUNTER — Other Ambulatory Visit: Payer: 59

## 2017-04-19 DIAGNOSIS — Z23 Encounter for immunization: Secondary | ICD-10-CM

## 2017-04-19 DIAGNOSIS — B20 Human immunodeficiency virus [HIV] disease: Secondary | ICD-10-CM | POA: Diagnosis not present

## 2017-04-19 LAB — LIPID PANEL
CHOL/HDL RATIO: 5.8 ratio — AB (ref <5.0)
Cholesterol: 161 mg/dL (ref <200)
HDL: 28 mg/dL — AB (ref >40)
LDL CALC: 85 mg/dL (ref <100)
TRIGLYCERIDES: 239 mg/dL — AB (ref <150)
VLDL: 48 mg/dL — ABNORMAL HIGH (ref <30)

## 2017-04-19 LAB — COMPREHENSIVE METABOLIC PANEL
ALBUMIN: 4.3 g/dL (ref 3.6–5.1)
ALK PHOS: 50 U/L (ref 40–115)
ALT: 16 U/L (ref 9–46)
AST: 15 U/L (ref 10–35)
BILIRUBIN TOTAL: 0.6 mg/dL (ref 0.2–1.2)
BUN: 20 mg/dL (ref 7–25)
CALCIUM: 9 mg/dL (ref 8.6–10.3)
CO2: 28 mmol/L (ref 20–31)
Chloride: 103 mmol/L (ref 98–110)
Creat: 1.2 mg/dL (ref 0.70–1.33)
GLUCOSE: 94 mg/dL (ref 65–99)
POTASSIUM: 4.1 mmol/L (ref 3.5–5.3)
Sodium: 138 mmol/L (ref 135–146)
Total Protein: 7 g/dL (ref 6.1–8.1)

## 2017-04-19 LAB — CBC
HEMATOCRIT: 44.7 % (ref 38.5–50.0)
HEMOGLOBIN: 15.8 g/dL (ref 13.2–17.1)
MCH: 33.1 pg — ABNORMAL HIGH (ref 27.0–33.0)
MCHC: 35.3 g/dL (ref 32.0–36.0)
MCV: 93.5 fL (ref 80.0–100.0)
MPV: 10.7 fL (ref 7.5–12.5)
Platelets: 176 10*3/uL (ref 140–400)
RBC: 4.78 MIL/uL (ref 4.20–5.80)
RDW: 14.1 % (ref 11.0–15.0)
WBC: 5.2 10*3/uL (ref 3.8–10.8)

## 2017-04-20 LAB — RPR

## 2017-04-21 LAB — T-HELPER CELL (CD4) - (RCID CLINIC ONLY)
CD4 % Helper T Cell: 28 % — ABNORMAL LOW (ref 33–55)
CD4 T CELL ABS: 430 /uL (ref 400–2700)

## 2017-04-21 LAB — HIV-1 RNA QUANT-NO REFLEX-BLD
HIV 1 RNA Quant: <20 copies/mL
HIV-1 RNA Quant, Log: <1.3 Log copies/mL

## 2017-04-25 ENCOUNTER — Encounter: Payer: Self-pay | Admitting: Internal Medicine

## 2017-04-26 MED FILL — METHOCARBAMOL 500 MG TABLET: 500 | 7 days supply | Qty: 30 | Fill #0

## 2017-04-26 MED FILL — SILDENAFIL 100 MG TABLET: 100 | 30 days supply | Qty: 6 | Fill #1

## 2017-04-26 MED FILL — CITALOPRAM HBR 20 MG TABLET: 20 | 90 days supply | Qty: 90 | Fill #1

## 2017-05-04 DIAGNOSIS — L821 Other seborrheic keratosis: Secondary | ICD-10-CM | POA: Diagnosis not present

## 2017-05-04 DIAGNOSIS — L820 Inflamed seborrheic keratosis: Secondary | ICD-10-CM | POA: Diagnosis not present

## 2017-05-04 DIAGNOSIS — L905 Scar conditions and fibrosis of skin: Secondary | ICD-10-CM | POA: Diagnosis not present

## 2017-05-08 MED FILL — GENVOYA TABLET: 150-150-200 | 30 days supply | Qty: 30 | Fill #7

## 2017-05-25 ENCOUNTER — Ambulatory Visit (INDEPENDENT_AMBULATORY_CARE_PROVIDER_SITE_OTHER): Payer: 59 | Admitting: Internal Medicine

## 2017-05-25 ENCOUNTER — Encounter: Payer: Self-pay | Admitting: Internal Medicine

## 2017-05-25 VITALS — BP 140/94 | HR 62 | Temp 98.0°F | Ht 68.0 in | Wt 189.1 lb

## 2017-05-25 DIAGNOSIS — R55 Syncope and collapse: Secondary | ICD-10-CM | POA: Diagnosis not present

## 2017-05-25 LAB — BASIC METABOLIC PANEL
BUN: 15 mg/dL (ref 6–23)
CALCIUM: 9.2 mg/dL (ref 8.4–10.5)
CO2: 29 meq/L (ref 19–32)
CREATININE: 1.23 mg/dL (ref 0.40–1.50)
Chloride: 104 mEq/L (ref 96–112)
GFR: 64.17 mL/min (ref >60.00)
GLUCOSE: 99 mg/dL (ref 70–99)
Potassium: 4.2 mEq/L (ref 3.5–5.1)
SODIUM: 139 meq/L (ref 135–145)

## 2017-05-25 LAB — CBC WITH DIFFERENTIAL/PLATELET
BASOS ABS: 0 10*3/uL (ref 0.0–0.1)
Basophils Relative: 0.3 % (ref 0.0–3.0)
EOS ABS: 0 10*3/uL (ref 0.0–0.7)
Eosinophils Relative: 0.6 % (ref 0.0–5.0)
HCT: 46.6 % (ref 39.0–52.0)
Hemoglobin: 16 g/dL (ref 13.0–17.0)
LYMPHS ABS: 1.9 10*3/uL (ref 0.7–4.0)
LYMPHS PCT: 31.7 % (ref 12.0–46.0)
MCHC: 34.4 g/dL (ref 30.0–36.0)
MCV: 97.6 fl (ref 78.0–100.0)
Monocytes Absolute: 0.5 10*3/uL (ref 0.1–1.0)
Monocytes Relative: 7.9 % (ref 3.0–12.0)
NEUTROS ABS: 3.6 10*3/uL (ref 1.4–7.7)
NEUTROS PCT: 59.5 % (ref 43.0–77.0)
PLATELETS: 199 10*3/uL (ref 150.0–400.0)
RBC: 4.77 Mil/uL (ref 4.22–5.81)
RDW: 13.8 % (ref 11.5–15.5)
WBC: 6 10*3/uL (ref 4.0–10.5)

## 2017-05-25 LAB — TSH: TSH: 1.27 u[IU]/mL (ref 0.35–4.50)

## 2017-05-25 NOTE — Assessment & Plan Note (Signed)
Presyncope episodes: Had a single episode of palpitations 3 weeks ago and two presyncope episodes , by description sound vaso-vagal.  All this happening in the context of a change in lifestyle with better diet, stopping caffeine and exercising more. No history of heart disease. EKG today: Sinus bradycardia, normal, no previous EKG. Plan: Rest for now, echocardiogram, cardiology referral for possible further eval. Also repeat labs: BMP, CBC, TSH. See AVS

## 2017-05-25 NOTE — Progress Notes (Signed)
Pre visit review using our clinic review tool, if applicable. No additional management support is needed unless otherwise documented below in the visit note. 

## 2017-05-25 NOTE — Progress Notes (Signed)
Subjective:    Patient ID: Bruce Woods, male    DOB: 22-Jun-1961, 56 y.o.   MRN: [id number]  DOS:  05/25/2017 Type of visit - description : acute, presyncope Interval history: About a month ago, he decided to change his lifestyle, stop all caffeine and started going to the gym regularly. Has lost some weight.  About 3 weeks ago, at rest, he had a sudden onset of fluttering feeling in the chest associated with throat fullness. At that time did not have chest pain, difficulty breathing or nausea. Episode ended abruptly.  He had two presyncope spells, last week and 2 days ago, they were similar but the most recent one was more intense. He describes the spells as:  Sudden onset of diaphoresis, tunnel vision, he was told he looked pale. He had chest pressure, anterior chest  and mild. At the time he was exercising, he denies palpitations, he checked his pulse at the gym and he was elevated, 160?Marland Kitchen He was a slightly nauseous and dizzy. In both instances symptoms passed within few minutes after he laid down and then elevated the legs.  In between episodes he feels excellent.   Review of Systems  Denies lower extremity edema, no dyspnea on exertion, no diarrhea or blood in the stools. Not taking any new medications or OTCs Stress is at  baseline Denies history of stroke symptoms such as headache, double vision, slurred speech or motor deficits  Past Medical History:  Diagnosis Date  . DEPRESSION   . HIV DISEASE   . Sarcoidosis 2000   question of    Past Surgical History:  Procedure Laterality Date  . LUNG SURGERY  2000   vats  . TONSILLECTOMY AND ADENOIDECTOMY      Social History   Social History  . Marital status: Married    Spouse name: N/A  . Number of children: 2  . Years of education: N/A   Occupational History  . 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 Smoking status: Former Research scientist (life sciences)  . Smokeless tobacco: Never Used  . Alcohol  use 0.6 oz/week    1 Standard drinks or equivalent per week     Comment: socially   . Drug use: No  . Sexual activity: Yes     Comment: declined condoms   Other Topics Concern  . Not on file   Social History Narrative   [name]   1 daughter married         Allergies as of 05/25/2017      Reactions   Morphine Other (See Comments)   Resp distress, rash, tachycardia   Celebrex [celecoxib] Nausea Only   Doxycycline Other (See Comments)   Elevated LFT's   Wellbutrin [bupropion] Hives      Medication List       Accurate as of 05/25/17  7:55 PM. Always use your most recent med list.          citalopram 20 MG tablet Commonly known as:  CELEXA Take 1 tablet (20 mg total) by mouth daily.   elvitegravir-cobicistat-emtricitabine-tenofovir 150-150-200-10 MG Tabs tablet Commonly known as:  GENVOYA Take 1 tablet by mouth daily with breakfast.   methocarbamol 500 MG tablet Commonly known as:  ROBAXIN Take 500 mg by mouth as needed.   VIAGRA 100 MG tablet Generic drug:  sildenafil TAKE 1/2-1 TABLET (50-100 MG TOTAL) BY MOUTH DAILY AS NEEDED FOR ERECTILE DYSFUNCTION.  Objective:   Physical Exam BP (!) 140/94 (BP Location: Left Arm, Patient Position: Sitting, Woods Size: Small)   Pulse 62   Temp 98 F (36.7 C) (Oral)   Ht 5\' 8"  (1.727 m)   Wt 189 lb 2 oz (85.8 kg)   SpO2 97%   BMI 28.76 kg/m  General:   Well developed, well nourished . NAD.  HEENT:  Normocephalic . Face symmetric, atraumatic Neck: no thyromegaly Lungs:  CTA B Normal respiratory effort, no intercostal retractions, no accessory muscle use. Heart: RRR,  no murmur.  no pretibial edema bilaterally  Abdomen:  Not distended, soft, non-tender. No rebound or rigidity.  Skin: Not pale. Not jaundice Neurologic:  alert & oriented X3.  Speech normal, gait appropriate for age and unassisted Psych--  Cognition and judgment appear intact.  Cooperative with normal attention span and  concentration.  Behavior appropriate. Slightly anxious but no depressed appearing.    Assessment & Plan:    Assessment: HIV + Depression ED Vitamin D deficiency ?sarcoidosis dx 2000  PLAN  Presyncope episodes: Had a single episode of palpitations 3 weeks ago and two presyncope episodes , by description sound vaso-vagal.  All this happening in the context of a change in lifestyle with better diet, stopping caffeine and exercising more. No history of heart disease. EKG today: Sinus bradycardia, normal, no previous EKG. Plan: Rest for now, echocardiogram, cardiology referral for possible further eval. Also repeat labs: BMP, CBC, TSH. See AVS

## 2017-05-25 NOTE — Patient Instructions (Signed)
GO TO THE LAB : Get the blood work      Will get a echocardiogram  Take it easy for the next couple of weeks  Call or go to the ER if severe symptoms. Keep yourself hydrated. Vagal maneuvers are great if you have symptoms.

## 2017-05-31 ENCOUNTER — Ambulatory Visit (HOSPITAL_COMMUNITY): Payer: 59 | Attending: Cardiology

## 2017-05-31 ENCOUNTER — Other Ambulatory Visit: Payer: Self-pay

## 2017-05-31 DIAGNOSIS — I080 Rheumatic disorders of both mitral and aortic valves: Secondary | ICD-10-CM | POA: Diagnosis not present

## 2017-05-31 DIAGNOSIS — R55 Syncope and collapse: Secondary | ICD-10-CM | POA: Insufficient documentation

## 2017-05-31 NOTE — Progress Notes (Signed)
Carol Ada, MD Reason for referral-Presyncope, chest pain and palpitations  HPI: 56 year old male for evaluation of presyncope, chest pain and palpitations at request of Kathlene November, MD. Laboratories July 2018 showed normal TSH, Hemoglobin 16 and potassium 4.2. Echocardiogram August 2018 showed ejection fraction 55-60%, mild left ventricular hypertrophy, grade 1 diastolic dysfunction, trace aortic insufficiency, mild mitral regurgitation and mild left atrial enlargement. Over the past one year he has had intermittent episodes of palpitations. They can occur either with exertion or at rest. They are described as heart racing for approximately 1 minute and resolves spontaneously. There is associated mild dyspnea but no chest pain. He feels fatigued afterwards. Recently he had an episode while exercising on the treadmill and also lifting weights. These episodes were associated with chest tightness and presyncope. He laid down and did not have frank syncope. He otherwise is not having palpitations denies dyspnea on exertion or exertional chest pain. Because of the above we were asked to evaluate.  Current Outpatient Prescriptions  Medication Sig Dispense Refill  . citalopram (CELEXA) 20 MG tablet Take 1 tablet (20 mg total) by mouth daily. 90 tablet 3  . elvitegravir-cobicistat-emtricitabine-tenofovir (GENVOYA) 150-150-200-10 MG TABS tablet Take 1 tablet by mouth daily with breakfast. 30 tablet 11  . methocarbamol (ROBAXIN) 500 MG tablet Take 500 mg by mouth as needed.    Marland Kitchen VIAGRA 100 MG tablet TAKE 1/2-1 TABLET (50-100 MG TOTAL) BY MOUTH DAILY AS NEEDED FOR ERECTILE DYSFUNCTION. 10 tablet 6   No current facility-administered medications for this visit.     Allergies  Allergen Reactions  . Morphine Other (See Comments)    Resp distress, rash, tachycardia  . Celebrex [Celecoxib] Nausea Only  . Doxycycline Other (See Comments)    Elevated LFT's  . Wellbutrin [Bupropion] Hives     Past  Medical History:  Diagnosis Date  . DEPRESSION   . HIV DISEASE   . IBS (irritable bowel syndrome)   . Sarcoidosis 2000   question of    Past Surgical History:  Procedure Laterality Date  . LUNG SURGERY  2000   vats  . TONSILLECTOMY AND ADENOIDECTOMY      Social History   Social History  . Marital status: Married    Spouse name: N/A  . Number of children: 2  . Years of education: N/A   Occupational History  . 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 Smoking status: Former Research scientist (life sciences)  . Smokeless tobacco: Never Used  . Alcohol use 0.6 oz/week    1 Standard drinks or equivalent per week     Comment: socially   . Drug use: No  . Sexual activity: Yes     Comment: declined condoms   Other Topics Concern  . Not on file   Social History Narrative   [name]   1 daughter married       Family History  Problem Relation Age of Onset  . Hyperlipidemia Mother   . Hypertension Mother   . Cancer Mother        glioblastoma  . Sudden death Neg Hx   . Heart attack Neg Hx   . Diabetes Neg Hx   . Colon cancer Neg Hx   . Prostate cancer Neg Hx     ROS: no fevers or chills, productive cough, hemoptysis, dysphasia, odynophagia, melena, hematochezia, dysuria, hematuria, rash, seizure activity, orthopnea, PND, pedal edema, claudication. Remaining systems are negative.  Physical Exam:  Blood pressure 128/68, pulse 60, height 5\' 8"  (1.727 m), weight 86.6 kg (191 lb).  General:  Well developed/well nourished in NAD Skin warm/dry Patient not depressed No peripheral clubbing Back-normal HEENT-normal/normal eyelids Neck supple/normal carotid upstroke bilaterally; no bruits; no JVD; no thyromegaly chest - CTA/ normal expansion CV - RRR/normal S1 and S2; no murmurs, rubs or gallops;  PMI nondisplaced Abdomen -NT/ND, no HSM, no mass, + bowel sounds, no bruit 2+ femoral pulses, no bruits Ext-no edema, chords, 2+ DP Neuro-grossly  nonfocal  ECG - 05/25/2017-sinus bradycardia. personally reviewed  A/P  1 Palpitations-etiology unclear at this point. Electrocardiogram showed sinus bradycardia. We will arrange an event monitor to further assess. Note his LV function is normal. TSH and potassium normal.  2 chest pain-patient had 2 episodes of chest pain while exercising but occurred in the setting of palpitations as well. I will arrange an exercise treadmill to screen for coronary disease and also to rule out exercise-induced arrhythmia.  3 near syncope-as outlined above we will arrange an event monitor to further assess.  4 HIV-management per ID.  Kirk Ruths, MD

## 2017-06-01 ENCOUNTER — Ambulatory Visit (INDEPENDENT_AMBULATORY_CARE_PROVIDER_SITE_OTHER): Payer: 59 | Admitting: Cardiology

## 2017-06-01 ENCOUNTER — Encounter: Payer: Self-pay | Admitting: Cardiology

## 2017-06-01 ENCOUNTER — Ambulatory Visit (INDEPENDENT_AMBULATORY_CARE_PROVIDER_SITE_OTHER): Payer: 59

## 2017-06-01 VITALS — BP 128/68 | HR 60 | Ht 68.0 in | Wt 191.0 lb

## 2017-06-01 DIAGNOSIS — R55 Syncope and collapse: Secondary | ICD-10-CM

## 2017-06-01 DIAGNOSIS — R002 Palpitations: Secondary | ICD-10-CM | POA: Diagnosis not present

## 2017-06-01 DIAGNOSIS — R072 Precordial pain: Secondary | ICD-10-CM

## 2017-06-01 NOTE — Patient Instructions (Signed)
Medication Instructions:   NO CHANGE  Testing/Procedures:  Your physician has requested that you have an exercise tolerance test. For further information please visit [email or web address]. Please also follow instruction sheet, as given.   Your physician has recommended that you wear an event monitor. Event monitors are medical devices that record the heart's electrical activity. Doctors most often [place] these monitors to diagnose arrhythmias. Arrhythmias are problems with the speed or rhythm of the heartbeat. The monitor is a small, portable device. You can wear one while you do your normal daily activities. This is usually used to diagnose what is causing palpitations/syncope (passing out).    Follow-Up:  Your physician recommends that you schedule a follow-up appointment in: [place]

## 2017-06-06 MED FILL — GENVOYA TABLET: 150-150-200 | 30 days supply | Qty: 30 | Fill #8

## 2017-06-07 ENCOUNTER — Emergency Department (HOSPITAL_BASED_OUTPATIENT_CLINIC_OR_DEPARTMENT_OTHER)
Admission: EM | Admit: 2017-06-07 | Discharge: 2017-06-07 | Disposition: A | Discharge status: Home / Self Care | Payer: 59 | Attending: Emergency Medicine | Admitting: Emergency Medicine

## 2017-06-07 ENCOUNTER — Other Ambulatory Visit: Payer: Self-pay

## 2017-06-07 ENCOUNTER — Emergency Department (HOSPITAL_BASED_OUTPATIENT_CLINIC_OR_DEPARTMENT_OTHER): Payer: 59

## 2017-06-07 ENCOUNTER — Telehealth: Payer: Self-pay | Admitting: Internal Medicine

## 2017-06-07 ENCOUNTER — Encounter (HOSPITAL_BASED_OUTPATIENT_CLINIC_OR_DEPARTMENT_OTHER): Payer: Self-pay | Admitting: *Deleted

## 2017-06-07 ENCOUNTER — Telehealth: Payer: Self-pay | Admitting: *Deleted

## 2017-06-07 DIAGNOSIS — Z87891 Personal history of nicotine dependence: Secondary | ICD-10-CM | POA: Insufficient documentation

## 2017-06-07 DIAGNOSIS — Z79899 Other long term (current) drug therapy: Secondary | ICD-10-CM | POA: Insufficient documentation

## 2017-06-07 DIAGNOSIS — R0789 Other chest pain: Secondary | ICD-10-CM | POA: Diagnosis not present

## 2017-06-07 DIAGNOSIS — R079 Chest pain, unspecified: Secondary | ICD-10-CM | POA: Diagnosis not present

## 2017-06-07 DIAGNOSIS — B20 Human immunodeficiency virus [HIV] disease: Secondary | ICD-10-CM | POA: Diagnosis not present

## 2017-06-07 LAB — D-DIMER, QUANTITATIVE: D-Dimer, Quant: <0.27 ug/mL-FEU (ref 0.00–0.50)

## 2017-06-07 LAB — BASIC METABOLIC PANEL
Anion gap: 17 — ABNORMAL HIGH (ref 5–15)
BUN: 20 mg/dL (ref 6–20)
CHLORIDE: 104 mmol/L (ref 101–111)
CO2: 20 mmol/L — AB (ref 22–32)
CREATININE: 1.4 mg/dL — AB (ref 0.61–1.24)
Calcium: 9.5 mg/dL (ref 8.9–10.3)
GFR calc non Af Amer: 54 mL/min — ABNORMAL LOW (ref >60)
Glucose, Bld: 102 mg/dL — ABNORMAL HIGH (ref 65–99)
Potassium: 3.7 mmol/L (ref 3.5–5.1)
Sodium: 141 mmol/L (ref 135–145)

## 2017-06-07 LAB — CBC
HEMATOCRIT: 47.3 % (ref 39.0–52.0)
HEMOGLOBIN: 17.2 g/dL — AB (ref 13.0–17.0)
MCH: 33 pg (ref 26.0–34.0)
MCHC: 36.4 g/dL — ABNORMAL HIGH (ref 30.0–36.0)
MCV: 90.8 fL (ref 78.0–100.0)
PLATELETS: 219 10*3/uL (ref 150–400)
RBC: 5.21 MIL/uL (ref 4.22–5.81)
RDW: 13.2 % (ref 11.5–15.5)
WBC: 9.8 10*3/uL (ref 4.0–10.5)

## 2017-06-07 LAB — TROPONIN I
Troponin I: <0.03 ng/mL (ref <0.03)
Troponin I: <0.03 ng/mL (ref <0.03)

## 2017-06-07 MED ORDER — ASPIRIN 81 MG PO CHEW
324.0000 mg | CHEWABLE_TABLET | Freq: Once | ORAL | Status: AC
  Administered 2017-06-07: 324 mg via ORAL
  Filled 2017-06-07: qty 4

## 2017-06-07 NOTE — ED Triage Notes (Signed)
Pt reports that he started having CP while working out this morning. States that SOB and CP started about 20 minutes PTA. Pt drove himself to the ED.  Speaking in complete sentences. Pt wearing a cardiac monitor, due to having episodes of SOB and CP.  Pt seen by Dr. Stanford Breed.

## 2017-06-07 NOTE — Telephone Encounter (Signed)
Pt's spouse called in to make PCP aware that pt is currently in the ED [facility]). She said that he went to the gym to work out after being advised by provider not to. She said that he has SDT and pass out.   She said that it is urgent that provider knows to view info in My Chart.

## 2017-06-07 NOTE — Telephone Encounter (Signed)
FYI

## 2017-06-07 NOTE — ED Provider Notes (Signed)
[place] DEPT [facility] Provider Note   CSN: [id number] Arrival date & time: 06/07/17  1038     History   Chief Complaint Chief Complaint  Patient presents with  . Chest Pain    HPI Bruce Woods is a 56 y.o. male.  Patient with history of HIV, IBS, depression, sarcoidosis presents with right chest pain and pressure since approximately 10:00 this morning. Patient was working out followed by aerobic exercise pulling a sled with weights and started having symptoms. Patient has follow-up with cardiology and has a stress test planned for Tuesday. Patient has a monitor for which he is currently wearing. Patient had a near syncope and possible brief syncopal episode as well. Patient denies classic blood clot risk factors. Patient has seen Dr. Stanford Breed cardiology. Patient mild soreness of breath that has improved. Patient had chest pressure that is improved.      Past Medical History:  Diagnosis Date  . DEPRESSION   . HIV DISEASE   . IBS (irritable bowel syndrome)   . Sarcoidosis 2000   question of    Patient Active Problem List   Diagnosis Date Noted  . PCP NOTES >>>>>>>>>>>>>>>>>>>>>>>>> 01/01/2016  . Insomnia 11/19/2013  . Annual physical exam 10/01/2013  . Right hip pain 05/25/2012  . Fatigue, low vit D 08/19/2011  . Erectile dysfunction 08/19/2011  . 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 Depression 01/03/2007  . Human immunodeficiency virus (HIV) disease ([place]) 08/18/2006  . SARCOIDOSIS 08/18/2006    Past Surgical History:  Procedure Laterality Date  . LUNG SURGERY  2000   vats  . TONSILLECTOMY AND ADENOIDECTOMY         Home Medications    Prior to Admission medications   Medication Sig Start Date End Date Taking? Authorizing Provider  citalopram (CELEXA) 20 MG tablet Take 1 tablet (20 mg total) by mouth daily. 10/12/16   Michel Bickers, MD  elvitegravir-cobicistat-emtricitabine-tenofovir (GENVOYA) 150-150-200-10 MG TABS tablet Take 1 tablet by mouth daily with  breakfast. 10/12/16   Michel Bickers, MD  methocarbamol (ROBAXIN) 500 MG tablet Take 500 mg by mouth as needed.    [provider]  VIAGRA 100 MG tablet TAKE 1/2-1 TABLET (50-100 MG TOTAL) BY MOUTH DAILY AS NEEDED FOR ERECTILE DYSFUNCTION. 11/04/16   Colon Branch, MD    Family History Family History  Problem Relation Age of Onset  . Hyperlipidemia Mother   . Hypertension Mother   . Cancer Mother        glioblastoma  . Sudden death Neg Hx   . Heart attack Neg Hx   . Diabetes Neg Hx   . Colon cancer Neg Hx   . Prostate cancer Neg Hx     Social History Social History  Substance Use Topics  . Smoking status: Former Research scientist (life sciences)  . Smokeless tobacco: Never Used  . Alcohol use 0.6 oz/week    1 Standard drinks or equivalent per week     Comment: socially      Allergies   Morphine; Celebrex [celecoxib]; Doxycycline; and Wellbutrin [bupropion]   Review of Systems Review of Systems   Physical Exam Updated Vital Signs BP 130/78   Pulse 63   Resp 17   SpO2 92%   Physical Exam  Constitutional: He is oriented to person, place, and time. He appears well-developed and well-nourished.  HENT:  Head: Normocephalic and atraumatic.  Eyes: Conjunctivae are normal. Right eye exhibits no discharge. Left eye exhibits no discharge.  Neck: Normal range of motion. Neck supple. No tracheal  deviation present.  Cardiovascular: Normal rate, regular rhythm and intact distal pulses.   No murmur heard. Pulmonary/Chest: Effort normal and breath sounds normal.  Abdominal: Soft. He exhibits no distension. There is no tenderness. There is no guarding.  Musculoskeletal: He exhibits no edema or tenderness.  Neurological: He is alert and oriented to person, place, and time.  Skin: Skin is warm. No rash noted.  Psychiatric: He has a normal mood and affect.  Nursing note and vitals reviewed.    ED Treatments / Results  Labs (all labs ordered are listed, but only abnormal results are  displayed) Labs Reviewed  BASIC METABOLIC PANEL - Abnormal; Notable for the following:       Result Value   CO2 20 (*)    Glucose, Bld 102 (*)    Creatinine, Ser 1.40 (*)    GFR calc non Af Amer 54 (*)    Anion gap 17 (*)    All other components within normal limits  CBC - Abnormal; Notable for the following:    Hemoglobin 17.2 (*)    MCHC 36.4 (*)    All other components within normal limits  TROPONIN I  D-DIMER, QUANTITATIVE (NOT AT [facility])  TROPONIN I    EKG  EKG Interpretation None     EKG reviewed sinus heart rate 94, normal QT, normal axis, nonspecific T-wave changes, no ST elevation.  Radiology Dg Chest 2 View  Result Date: 06/07/2017 CLINICAL DATA:  Chest pain EXAM: CHEST  2 VIEW COMPARISON:  08/18/2010 chest radiograph. FINDINGS: Cardiac monitor overlies the left chest. Stable cardiomediastinal silhouette with normal heart size. No pneumothorax. No pleural effusion. Stable surgical suture line in the peripheral right mid lung. No pulmonary edema. No acute consolidative airspace disease. IMPRESSION: No active cardiopulmonary disease. Electronically Signed   By: Ilona Sorrel M.D.   On: 06/07/2017 11:05    Procedures Procedures (including critical care time)  Medications Ordered in ED Medications  aspirin chewable tablet 324 mg (324 mg Oral Given 06/07/17 1204)     Initial Impression / Assessment and Plan / ED Course  I have reviewed the triage vital signs and the nursing notes.  Pertinent labs & imaging results that were available during my care of the patient were reviewed by me and considered in my medical decision making (see chart for details).    Patient presents after episode of chest pressure worsening the right side while exerting himself. Patient is being followed by cardiology. Patient symptoms of improved. Plan for aspirin and cardiac workup. Patient low risk for blood clots d-dimer will be sent. Patient does have a monitor currently in place and will try  to contact [employer] services [phone].  Discussed the case with Dr. Stanford Breed, initially I recommended transfer for telemetry observation however patient does not want to be observed in the hospital and prefers outpatient follow-up. Patient has stress test scheduled on Tuesday. Instructions to avoid exertional activities and follow-up as directed with strict reasons to return.  Pt improved on reassessment.   Results and differential diagnosis were discussed with the patient/parent/guardian. Xrays were independently reviewed by myself.  Close follow up outpatient was discussed, comfortable with the plan.   Medications  aspirin chewable tablet 324 mg (324 mg Oral Given 06/07/17 1204)    Vitals:   06/07/17 1130 06/07/17 1200  BP: 126/78 130/78  Pulse: 68 63  Resp: 16 17  SpO2: 99% 92%    Final diagnoses:  Acute chest pain     Final Clinical Impressions(s) /  ED Diagnoses   Final diagnoses:  Acute chest pain    New Prescriptions New Prescriptions   No medications on file     Elnora Morrison, MD 06/07/17 1354

## 2017-06-07 NOTE — Telephone Encounter (Signed)
Okay, thank you!

## 2017-06-07 NOTE — Telephone Encounter (Signed)
Received call form monitoring service Patient had critical report note syncope Report showed stable EKG sinus tachycardia with HR 110 Patient currently in ED

## 2017-06-08 ENCOUNTER — Telehealth: Payer: Self-pay

## 2017-06-08 ENCOUNTER — Telehealth (HOSPITAL_COMMUNITY): Payer: Self-pay

## 2017-06-08 NOTE — Telephone Encounter (Signed)
Received monitor strips from [employer] which revealed sinus tachycardia rate 110 at 9:22 am 06/07/17.Spoke to patient he stated he was not aware, he was sleeping at that time.EKG shown to DOD Dr.Hilty he advised of no changes.Advised to keep appointments as planned.

## 2017-06-08 NOTE — Telephone Encounter (Signed)
Encounter complete. 

## 2017-06-09 NOTE — Discharge Instructions (Signed)
Follow-up with your cardiologist as directed.

## 2017-06-13 ENCOUNTER — Ambulatory Visit (HOSPITAL_COMMUNITY)
Admission: RE | Admit: 2017-06-13 | Discharge: 2017-06-13 | Disposition: A | Discharge status: Home / Self Care | Payer: 59 | Source: Ambulatory Visit | Attending: Cardiovascular Disease | Admitting: Cardiovascular Disease

## 2017-06-13 DIAGNOSIS — R072 Precordial pain: Secondary | ICD-10-CM | POA: Insufficient documentation

## 2017-06-13 LAB — EXERCISE TOLERANCE TEST
CSEPED: 7 min
CSEPHR: 98 %
Estimated workload: 8.5 METS
Exercise duration (sec): 2 s
MPHR: 162 {beats}/min
Peak HR: 160 {beats}/min
RPE: 18
Rest HR: 82 {beats}/min

## 2017-06-14 ENCOUNTER — Telehealth: Payer: Self-pay | Admitting: *Deleted

## 2017-06-14 ENCOUNTER — Encounter: Payer: Self-pay | Admitting: *Deleted

## 2017-06-14 DIAGNOSIS — R072 Precordial pain: Secondary | ICD-10-CM

## 2017-06-14 NOTE — Telephone Encounter (Signed)
Spoke with pt, aware per discussion with dr Stanford Breed, order placed for cardiac CT. Information sent to pre-cert and [facility] for scheduling.

## 2017-06-14 NOTE — Telephone Encounter (Addendum)
-----   Message from Lelon Perla, MD sent at 06/14/2017  7:23 AM EDT ----- Normal Kirk Ruths   Patient aware of normal GXT results. He is very anxious and reports not sleeping at all last night worried about the results. He is not satisfied with the results of the stress test because he continues to have problems. He reports walking his trash to the mailbox today and having extreme fatigue, SOB and a full feeling in his chest. He reports heart rate getting up to 130-140 with little exertion. He wants to know what is going on and is open to catherization or CTA. Will discuss with dr Stanford Breed

## 2017-07-05 MED FILL — GENVOYA TABLET: 150-150-200 | 30 days supply | Qty: 30 | Fill #9

## 2017-07-14 ENCOUNTER — Encounter: Payer: Self-pay | Admitting: Cardiology

## 2017-07-14 NOTE — Progress Notes (Signed)
HPI: FU presyncope, chest pain and palpitations. Laboratories July 2018 showed normal TSH, Hemoglobin 16 and potassium 4.2. Echocardiogram August 2018 showed ejection fraction 55-60%, mild left ventricular hypertrophy, grade 1 diastolic dysfunction, trace aortic insufficiency, mild mitral regurgitation and mild left atrial enlargement. Seen in ER 8/18 with CP; troponin and D dimer negative. Monitor 8/18 showed no significant arrhythmia. ETT 8/18 normal. Since last seen, patient describes significant dyspnea on exertion. This has been present for 4-6 months. He also develops chest tightness after he develops dyspnea. He does not have the symptoms at rest. His tightness is not clearly pleuritic. He does not have orthopnea, PND, pedal edema and has not had any recurrent syncope.  Current Outpatient Prescriptions  Medication Sig Dispense Refill  . citalopram (CELEXA) 20 MG tablet Take 1 tablet (20 mg total) by mouth daily. 90 tablet 3  . elvitegravir-cobicistat-emtricitabine-tenofovir (GENVOYA) 150-150-200-10 MG TABS tablet Take 1 tablet by mouth daily with breakfast. 30 tablet 11  . methocarbamol (ROBAXIN) 500 MG tablet Take 500 mg by mouth as needed.    Marland Kitchen VIAGRA 100 MG tablet TAKE 1/2-1 TABLET (50-100 MG TOTAL) BY MOUTH DAILY AS NEEDED FOR ERECTILE DYSFUNCTION. 10 tablet 6   No current facility-administered medications for this visit.      Past Medical History:  Diagnosis Date  . DEPRESSION   . HIV DISEASE   . IBS (irritable bowel syndrome)   . Sarcoidosis 2000   question of    Past Surgical History:  Procedure Laterality Date  . LUNG SURGERY  2000   vats  . TONSILLECTOMY AND ADENOIDECTOMY      Social History   Social History  . Marital status: Married    Spouse name: N/A  . Number of children: 2  . Years of education: N/A   Occupational History  . 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 Smoking status: Former Research scientist (life sciences)  . Smokeless  tobacco: Never Used  . Alcohol use 0.6 oz/week    1 Standard drinks or equivalent per week     Comment: socially   . Drug use: No  . Sexual activity: Yes     Comment: declined condoms   Other Topics Concern  . Not on file   Social History Narrative   [name]   1 daughter married       Family History  Problem Relation Age of Onset  . Hyperlipidemia Mother   . Hypertension Mother   . Cancer Mother        glioblastoma  . Sudden death Neg Hx   . Heart attack Neg Hx   . Diabetes Neg Hx   . Colon cancer Neg Hx   . Prostate cancer Neg Hx     ROS: no fevers or chills, productive cough, hemoptysis, dysphasia, odynophagia, melena, hematochezia, dysuria, hematuria, rash, seizure activity, orthopnea, PND, pedal edema, claudication. Remaining systems are negative.  Physical Exam: Well-developed well-nourished in no acute distress.  Skin is warm and dry.  HEENT is normal.  Neck is supple.  Chest is clear to auscultation with normal expansion.  Cardiovascular exam is regular rate and rhythm.  Abdominal exam nontender or distended. No masses palpated. Extremities show no edema. neuro grossly intact   A/P  1 Palpitations-LV function is normal. Monitor showed no significant arrhythmias.   2 chest pain/dyspnea-Patient continues to have dyspnea on exertion and then develops chest tightness that he feels may be pulmonary  related. His exercise treadmill was negative but he has persistent symptoms. I will arrange a cardiac CTA to further exclude coronary disease. Note a previous d-dimer was normal. Chest x-ray in August showed no active cardiopulmonary disease. He also has a history of sarcoid. I will ask pulmonary to evaluate for his dyspnea as well.  3 near syncope-no recurrent episodes.  4 HIV-management per infectious disease.    Kirk Ruths, MD

## 2017-07-20 MED FILL — SILDENAFIL CITRATE 100 MG T: 100 | 30 days supply | Qty: 6 | Fill #2

## 2017-07-27 ENCOUNTER — Encounter: Payer: Self-pay | Admitting: Cardiology

## 2017-07-27 ENCOUNTER — Ambulatory Visit (INDEPENDENT_AMBULATORY_CARE_PROVIDER_SITE_OTHER): Payer: 59 | Admitting: Cardiology

## 2017-07-27 VITALS — BP 142/74 | HR 72 | Ht 68.0 in | Wt 190.0 lb

## 2017-07-27 DIAGNOSIS — D869 Sarcoidosis, unspecified: Secondary | ICD-10-CM

## 2017-07-27 DIAGNOSIS — R0602 Shortness of breath: Secondary | ICD-10-CM

## 2017-07-27 DIAGNOSIS — R072 Precordial pain: Secondary | ICD-10-CM | POA: Diagnosis not present

## 2017-07-27 LAB — BASIC METABOLIC PANEL
BUN / CREAT RATIO: 15 (ref 9–20)
BUN: 18 mg/dL (ref 6–24)
CO2: 25 mmol/L (ref 20–29)
CREATININE: 1.19 mg/dL (ref 0.76–1.27)
Calcium: 9.7 mg/dL (ref 8.7–10.2)
Chloride: 96 mmol/L (ref 96–106)
GFR calc Af Amer: 77 mL/min/{1.73_m2} (ref >59)
GFR, EST NON AFRICAN AMERICAN: 67 mL/min/{1.73_m2} (ref >59)
Glucose: 103 mg/dL — ABNORMAL HIGH (ref 65–99)
Potassium: 4 mmol/L (ref 3.5–5.2)
Sodium: 138 mmol/L (ref 134–144)

## 2017-07-27 NOTE — Patient Instructions (Signed)
Medication Instructions:   NO CHANGE  Labwork:  Your physician recommends that you HAVE LAB WORK TODAY  Testing/Procedures:  Your physician has requested that you have cardiac CT. Cardiac computed tomography (CT) is a painless test that uses an x-ray machine to take clear, detailed pictures of your heart. For further information please visit HugeFiesta.[email or web address]. Please follow instruction sheet as given.     Follow-Up:  Your physician recommends that you schedule a follow-up appointment in: 3 MONTHS WITH DR CRENSHAW  REFERRAL TO PULMONARY FOR SARCOID AND SOB   If you need a refill on your cardiac medications before your next appointment, please call your pharmacy.

## 2017-08-01 ENCOUNTER — Ambulatory Visit (INDEPENDENT_AMBULATORY_CARE_PROVIDER_SITE_OTHER): Payer: 59 | Admitting: Internal Medicine

## 2017-08-01 ENCOUNTER — Encounter: Payer: Self-pay | Admitting: Cardiology

## 2017-08-01 ENCOUNTER — Encounter: Payer: Self-pay | Admitting: Internal Medicine

## 2017-08-01 ENCOUNTER — Ambulatory Visit (INDEPENDENT_AMBULATORY_CARE_PROVIDER_SITE_OTHER)
Admission: RE | Admit: 2017-08-01 | Discharge: 2017-08-01 | Disposition: A | Discharge status: Home / Self Care | Payer: 59 | Source: Ambulatory Visit | Attending: Internal Medicine | Admitting: Internal Medicine

## 2017-08-01 VITALS — BP 140/86 | HR 65 | Ht 68.0 in | Wt 189.0 lb

## 2017-08-01 DIAGNOSIS — R0609 Other forms of dyspnea: Secondary | ICD-10-CM | POA: Diagnosis not present

## 2017-08-01 DIAGNOSIS — Z87891 Personal history of nicotine dependence: Secondary | ICD-10-CM | POA: Diagnosis not present

## 2017-08-01 MED ORDER — PANTOPRAZOLE SODIUM 40 MG PO TBEC: 40.0000 mg | DELAYED_RELEASE_TABLET | Freq: Every day | ORAL | 2 refills | Status: DC

## 2017-08-01 MED ORDER — FAMOTIDINE 20 MG PO TABS: ORAL_TABLET | ORAL | 2 refills | Status: DC

## 2017-08-01 MED FILL — FAMOTIDINE 20 MG TABLET: 20 | 30 days supply | Qty: 30 | Fill #0

## 2017-08-01 MED FILL — PANTOPRAZOLE SOD DR 40 MG T: 40 | 90 days supply | Qty: 90 | Fill #0

## 2017-08-01 NOTE — Progress Notes (Signed)
Subjective:     Patient ID: Bruce Woods, male   DOB: 12-Mar-1961,    MRN: [id number]  HPI  66 yowm quit smoking 1985 around 2000 short of breath > MPN's dx by VATS with sarcoid around 05/1999 tried ICS under Bruce Woods 100% better s systemic rx p stopped ICS  p one year then needle exp 2002 then HIV dx s complications with undectable virus under Bruce Woods then mid summer 2018 developed sev episodes pre syncope with ov by Bruce Woods 05/25/17 hx =  " About a month prior to [place]  , he decided to change his lifestyle, stop all caffeine and started going to the gym regularly. Has lost some weight."   then doe sudden onset at gym doing his "regular routine" and doe ever since so cards w/u done > neg and referred to pulmonary clinic 08/01/2017 by Bruce Woods   08/01/2017 1st Bruce Woods   Chief Complaint  Patient presents with  . Pulmonary Consult    Referred by Bruce. Stanford Woods for eval of Sarcoid. Pt c/o DOE x 3 months. He gets winded walking from the parking deck at his work to his building.   since onset of doe at gym has avoided exertion and  had progressive doe to point of walking 50 ft at nl pace assoc with midline ant cp which was reproduced on GXT with very poor tolerance reaching target HR p only 4.5 min. Never symptoms at rest or noct  Only lab abn was hc03 =20 in ER 06/07/17 but nl at f/u 07/27/17  Min assoc cough/ no hoarseness or voice fatigue   No obvious day to day or daytime variability or assoc excess/ purulent sputum or mucus plugs or hemoptysis or cp or chest tightness, subjective wheeze or overt sinus or hb symptoms. No unusual exp hx or h/o childhood pna/ asthma or knowledge of premature birth.  Sleeping ok flat without nocturnal  or early am exacerbation  of respiratory  c/o's or need for noct saba. Also denies any obvious fluctuation of symptoms with weather or environmental changes or other aggravating or alleviating factors except as outlined above   Current Allergies,  Complete Past Medical History, Past Surgical History, Family History, and Social History were reviewed in [employer] record.  ROS  The following are not active complaints unless bolded Hoarseness, sore throat, dysphagia, dental problems, itching, sneezing,  nasal congestion or discharge of excess mucus or purulent secretions, ear ache,   fever, chills, sweats, unintended wt loss or wt gain, classically pleuritic or exertional cp,  orthopnea pnd or leg swelling, presyncope, palpitations, abdominal pain, anorexia, nausea, vomiting, diarrhea  or change in bowel habits or change in bladder habits, change in stools or change in urine, dysuria, hematuria,  rash, arthralgias, visual complaints, headache, numbness, weakness or ataxia or problems with walking or coordination,  change in mood/affect or memory.        Current Meds  Medication Sig  . citalopram (CELEXA) 20 MG tablet Take 1 tablet (20 mg total) by mouth daily.  Marland Kitchen elvitegravir-cobicistat-emtricitabine-tenofovir (GENVOYA) 150-150-200-10 MG TABS tablet Take 1 tablet by mouth daily with breakfast.  . methocarbamol (ROBAXIN) 500 MG tablet Take 500 mg by mouth as needed.  Marland Kitchen VIAGRA 100 MG tablet TAKE 1/2-1 TABLET (50-100 MG TOTAL) BY MOUTH DAILY AS NEEDED FOR ERECTILE DYSFUNCTION.          Review of Systems     Objective:   Physical Exam    amb  slt anxious wm nad  Wt Readings from Last 3 Encounters:  08/01/17 189 lb (85.7 kg)  07/27/17 190 lb (86.2 kg)  06/01/17 191 lb (86.6 kg)    Vital signs reviewed  - Note on arrival 02 sats  95% on RA      HEENT: nl dentition, turbinates bilaterally, and oropharynx. Nl external ear canals without cough reflex   NECK :  without JVD/Nodes/TM/ nl carotid upstrokes bilaterally   LUNGS: no acc muscle use,  Nl contour chest which is clear to A and P bilaterally without cough on insp or exp maneuvers   CV:  RRR  no s3 or murmur or increase in P2, and no edema   ABD:   soft and nontender with nl inspiratory excursion in the supine position. No bruits or organomegaly appreciated, bowel sounds nl  MS:  Nl gait/ ext warm without deformities, calf tenderness, cyanosis or clubbing No obvious joint restrictions   SKIN: warm and dry without lesions    NEURO:  alert, approp, nl sensorium with  no motor or cerebellar deficits apparent.    CXR PA and Lateral:   08/01/2017 :    I personally reviewed images and  impression as follows:    wnl    Labs   reviewed:     Chemistry      Component Value Date/Time   NA 138 07/27/2017 1104   Bruce 4.0 07/27/2017 1104   CL 96 07/27/2017 1104   CO2 25 07/27/2017 1104   BUN 18 07/27/2017 1104   CREATININE 1.19 07/27/2017 1104   CREATININE 1.20 04/19/2017 0928      Component Value Date/Time   CALCIUM 9.7 07/27/2017 1104   ALKPHOS 50 04/19/2017 0928   AST 15 04/19/2017 0928   ALT 16 04/19/2017 0928   BILITOT 0.6 04/19/2017 0928        Lab Results  Component Value Date   WBC 9.8 06/07/2017   HGB 17.2 (H) 06/07/2017   HCT 47.3 06/07/2017   MCV 90.8 06/07/2017   PLT 219 06/07/2017     Lab Results  Component Value Date   DDIMER <0.27 06/07/2017      Lab Results  Component Value Date   TSH 1.27 05/25/2017        Assessment:

## 2017-08-01 NOTE — Assessment & Plan Note (Addendum)
-08/01/2017   Walked RA  2 laps @ 185 ft each stopped due to  Sob and "punched in the chest" with sats 100% and peak HR = 98  -Spirometry 08/01/2017  vcd pattern on  f/v loop > rec max rx for gerd  X 2 weeks then consider cpst next    Symptoms are markedly disproportionate to objective findings and not clear this is actually much of a  lung problem but pt does appear to have difficult to sort out respiratory symptoms of unknown origin for which  DDX  = almost all start with A and  include Adherence, Ace Inhibitors, Acid Reflux, Active Sinus Disease, Alpha 1 Antitripsin deficiency, Anxiety masquerading as Airways dz,  ABPA,  Allergy(esp in young), Aspiration (esp in elderly), Adverse effects of meds,  Active smokers, A bunch of PE's (a small clot burden can't cause this syndrome unless there is already severe underlying pulm or vascular dz with poor reserve) plus two Bs  = Bronchiectasis and Beta blocker use..and one C= CHF   ? Acid (or non-acid) GERD > always difficult to exclude as up to 75% of pts in some series report no assoc GI/ Heartburn symptoms> rec max (24h)  acid suppression and diet restrictions/ reviewed and instructions given in writing.   ? Anxiety/ now deconditioning > usually at the bottom of this list of usual suspects but should be much higher on this pt's based on H and P and note already on psychotropics .   ? Allergy/ asthma > no assoc cough or noct / early am symptoms makes this most unlikely   ? A bunch of pe's > D dimer nl - while  A nl valute  may miss small peripheral pe, the clot burden with sob is moderately high and the d dimer has a very high neg pred value in this setting     ? CHF/ IHD > echo 05/31/17 ft ventricle: The cavity size was normal. Wall thickness was   increased in a pattern of mild LVH. Systolic function was normal.   The estimated ejection fraction was in the range of 55% to 60%.   Wall motion was normal; there were no regional wall motion   abnormalities.  Doppler parameters are consistent with abnormal   left ventricular relaxation (grade 1 diastolic dysfunction). - Aortic valve: There was trivial regurgitation. - Mitral valve: There was mild regurgitation. - Left atrium: The atrium was mildly dilated. Volume GXT 06/13/17 neg > cards f/u prn per Dr Stanford Breed   Pt admits he's been burning the candle at both ends / paying no attention to diet / not getting enough rest all of which could contribute to gerd/ anxiety issues > reviewed diet/ meds/ re-conditioning and f/u CPST if not improving   Total time devoted to counseling  > 50 % of initial 60 min office visit:  review case with pt/ discussion of options/alternatives/ personally creating written customized instructions  in presence of pt  then going over those specific  Instructions directly with the pt including how to use all of the meds but in particular covering each new medication in detail and the difference between the maintenance= "automatic" meds and the prns using an action plan format for the latter (If this problem/symptom => do that organization reading Left to right).  Please see AVS from this visit for a full list of these instructions which I personally wrote for this pt and  are unique to this visit.

## 2017-08-01 NOTE — Patient Instructions (Signed)
To get the most out of exercise, you need to be continuously aware that you are short of breath, but never out of breath, for 30 minutes daily. As you improve, it will actually be easier for you to do the same amount of exercise  in  30 minutes so always push to the level where you are short of breath.     Pantoprazole (protonix) 40 mg   Take  30-60 min before first meal of the day and Pepcid (famotidine)  20 mg one @  bedtime until return to office - this is the best way to tell whether stomach acid is contributing to your problem.    GERD (REFLUX)  is an extremely common cause of respiratory symptoms just like yours , many times with no obvious heartburn at all.    It can be treated with medication, but also with lifestyle changes including elevation of the head of your bed (ideally with 6 inch  bed blocks),  Smoking cessation, avoidance of late meals, excessive alcohol, and avoid fatty foods, chocolate, peppermint, colas, red wine, and acidic juices such as orange juice.  NO MINT OR MENTHOL PRODUCTS SO NO COUGH DROPS   USE SUGARLESS CANDY INSTEAD (Jolley ranchers or Stover's or Life Savers) or even ice chips will also do - the key is to swallow to prevent all throat clearing. NO OIL BASED VITAMINS - use powdered substitutes.    Please remember to go to the  x-ray department downstairs in the basement  for your tests - we will call you with the results when they are available.   Call if not improving after 2 weeks of consistent sub maximal exercise and reflux treatment/ diet and we can schedule you for a CPST next

## 2017-08-02 MED FILL — CITALOPRAM HBR 20 MG TABLET: 20 | 90 days supply | Qty: 90 | Fill #2

## 2017-08-04 MED FILL — GENVOYA TABLET: 150-150-200 | 30 days supply | Qty: 30 | Fill #10

## 2017-08-30 DIAGNOSIS — H524 Presbyopia: Secondary | ICD-10-CM | POA: Diagnosis not present

## 2017-08-30 DIAGNOSIS — H52223 Regular astigmatism, bilateral: Secondary | ICD-10-CM | POA: Diagnosis not present

## 2017-09-04 MED FILL — GENVOYA TABLET: 150-150-200 | 30 days supply | Qty: 30 | Fill #11

## 2017-10-02 ENCOUNTER — Other Ambulatory Visit: Payer: Self-pay | Admitting: Internal Medicine

## 2017-10-02 ENCOUNTER — Other Ambulatory Visit: Payer: 59

## 2017-10-02 ENCOUNTER — Other Ambulatory Visit: Payer: Self-pay | Admitting: *Deleted

## 2017-10-02 DIAGNOSIS — B20 Human immunodeficiency virus [HIV] disease: Secondary | ICD-10-CM

## 2017-10-02 DIAGNOSIS — Z113 Encounter for screening for infections with a predominantly sexual mode of transmission: Secondary | ICD-10-CM

## 2017-10-02 MED FILL — GENVOYA TABLET: 150-150-200 | 30 days supply | Qty: 30 | Fill #0

## 2017-10-03 LAB — COMPLETE METABOLIC PANEL WITH GFR
AG RATIO: 1.7 (calc) (ref 1.0–2.5)
ALKALINE PHOSPHATASE (APISO): 58 U/L (ref 40–115)
ALT: 25 U/L (ref 9–46)
AST: 22 U/L (ref 10–35)
Albumin: 4.5 g/dL (ref 3.6–5.1)
BILIRUBIN TOTAL: 0.6 mg/dL (ref 0.2–1.2)
BUN: 14 mg/dL (ref 7–25)
CHLORIDE: 104 mmol/L (ref 98–110)
CO2: 31 mmol/L (ref 20–32)
Calcium: 9.7 mg/dL (ref 8.6–10.3)
Creat: 1.29 mg/dL (ref 0.70–1.33)
GFR, EST AFRICAN AMERICAN: 70 mL/min/{1.73_m2} (ref >=60)
GFR, Est Non African American: 61 mL/min/{1.73_m2} (ref >=60)
GLUCOSE: 86 mg/dL (ref 65–99)
Globulin: 2.7 g/dL (calc) (ref 1.9–3.7)
POTASSIUM: 4.2 mmol/L (ref 3.5–5.3)
Sodium: 141 mmol/L (ref 135–146)
Total Protein: 7.2 g/dL (ref 6.1–8.1)

## 2017-10-03 LAB — CBC WITH DIFFERENTIAL/PLATELET
BASOS ABS: 7 {cells}/uL (ref 0–200)
Basophils Relative: 0.1 %
EOS ABS: 22 {cells}/uL (ref 15–500)
Eosinophils Relative: 0.3 %
HCT: 46 % (ref 38.5–50.0)
Hemoglobin: 16.7 g/dL (ref 13.2–17.1)
Lymphs Abs: 2424 cells/uL (ref 850–3900)
MCH: 33.3 pg — AB (ref 27.0–33.0)
MCHC: 36.3 g/dL — AB (ref 32.0–36.0)
MCV: 91.6 fL (ref 80.0–100.0)
MONOS PCT: 8.6 %
MPV: 11.4 fL (ref 7.5–12.5)
NEUTROS PCT: 57.8 %
Neutro Abs: 4219 cells/uL (ref 1500–7800)
PLATELETS: 203 10*3/uL (ref 140–400)
RBC: 5.02 10*6/uL (ref 4.20–5.80)
RDW: 12.7 % (ref 11.0–15.0)
TOTAL LYMPHOCYTE: 33.2 %
WBC: 7.3 10*3/uL (ref 3.8–10.8)
WBCMIX: 628 {cells}/uL (ref 200–950)

## 2017-10-03 LAB — T-HELPER CELL (CD4) - (RCID CLINIC ONLY)
CD4 % Helper T Cell: 24 % — ABNORMAL LOW (ref 33–55)
CD4 T CELL ABS: 600 /uL (ref 400–2700)

## 2017-10-04 LAB — HIV-1 RNA QUANT-NO REFLEX-BLD
HIV 1 RNA QUANT: 47 {copies}/mL — AB
HIV-1 RNA Quant, Log: 1.67 Log copies/mL — ABNORMAL HIGH

## 2017-10-16 ENCOUNTER — Encounter: Payer: Self-pay | Admitting: Internal Medicine

## 2017-10-16 ENCOUNTER — Ambulatory Visit: Payer: 59 | Admitting: Internal Medicine

## 2017-10-16 DIAGNOSIS — F3342 Major depressive disorder, recurrent, in full remission: Secondary | ICD-10-CM | POA: Diagnosis not present

## 2017-10-16 DIAGNOSIS — B20 Human immunodeficiency virus [HIV] disease: Secondary | ICD-10-CM | POA: Diagnosis not present

## 2017-10-16 MED ORDER — CITALOPRAM HYDROBROMIDE 20 MG PO TABS: 20.0000 mg | ORAL_TABLET | Freq: Every day | ORAL | 3 refills | Status: DC

## 2017-10-16 MED ORDER — ELVITEG-COBIC-EMTRICIT-TENOFAF 150-150-200-10 MG PO TABS: 1.0000 | ORAL_TABLET | Freq: Every day | ORAL | 3 refills | Status: DC

## 2017-10-16 MED FILL — CITALOPRAM HBR 20 MG TABLET: 20 | 90 days supply | Qty: 90 | Fill #0

## 2017-10-16 NOTE — Assessment & Plan Note (Signed)
His infection is under very good long-term control. He will continue Genvoya and get repeat lab work in 6 months.

## 2017-10-16 NOTE — Progress Notes (Signed)
Patient Active Problem List   Diagnosis Date Noted  . Depression 01/03/2007    Priority: High  . Human immunodeficiency virus (HIV) disease ([place]) 08/18/2006    Priority: High  . Dyspnea on exertion 08/01/2017  . PCP NOTES >>>>>>>>>>>>>>>>>>>>>>>>> 01/01/2016  . Insomnia 11/19/2013  . Annual physical exam 10/01/2013  . Right hip pain 05/25/2012  . Fatigue, low vit D 08/19/2011  . Erectile dysfunction 08/19/2011  . 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 SARCOIDOSIS 08/18/2006      Medication List        Accurate as of 10/16/17  2:10 PM. Always use your most recent med list.          citalopram 20 MG tablet Commonly known as:  CELEXA Take 1 tablet (20 mg total) by mouth daily.   elvitegravir-cobicistat-emtricitabine-tenofovir 150-150-200-10 MG Tabs tablet Commonly known as:  GENVOYA Take 1 tablet by mouth daily with breakfast.   methocarbamol 500 MG tablet Commonly known as:  ROBAXIN   VIAGRA 100 MG tablet Generic drug:  sildenafil TAKE 1/2-1 TABLET (50-100 MG TOTAL) BY MOUTH DAILY AS NEEDED FOR ERECTILE DYSFUNCTION.       Where to Get Your Medications    These medications were sent to [facility], [address]  [place], [place] [address]   Phone:  [phone]   citalopram 20 MG tablet  elvitegravir-cobicistat-emtricitabine-tenofovir 150-150-200-10 MG Tabs tablet     Subjective: Bruce Woods is in for his routine HIV follow-up visit. He has had no problems obtaining, taking or tolerating Genvoya. He never misses a single dose.  Review of Systems: Review of Systems  Constitutional: Negative for chills, diaphoresis, fever, malaise/fatigue and weight loss.  HENT: Negative for sore throat.   Respiratory: Negative for cough, sputum production and shortness of breath.   Cardiovascular: Negative for chest pain.  Gastrointestinal: Positive for heartburn. Negative for abdominal pain,  diarrhea, nausea and vomiting.  Genitourinary: Negative for dysuria and frequency.  Musculoskeletal: Negative for joint pain and myalgias.  Skin: Negative for rash.  Neurological: Negative for dizziness and headaches.  Psychiatric/Behavioral: Positive for depression. Negative for substance abuse. The patient is nervous/anxious.     Past Medical History:  Diagnosis Date  . DEPRESSION   . HIV DISEASE   . IBS (irritable bowel syndrome)   . Sarcoidosis 2000   question of    Social History   Tobacco Use  . Smoking status: Former Smoker    Packs/day: 1.00    Years: 2.00    Pack years: 2.00    Types: Cigarettes    Last attempt to quit: 11/01/1983    Years since quitting: 33.9  . Smokeless tobacco: Never Used  Substance Use Topics  . Alcohol use: Yes    Alcohol/week: 0.6 oz    Types: 1 Standard drinks or equivalent per week    Comment: socially   . Drug use: No    Family History  Problem Relation Age of Onset  . Hyperlipidemia Mother   . Hypertension Mother   . Cancer Mother        glioblastoma  . Sudden death Neg Hx   . Heart attack Neg Hx   . Diabetes Neg Hx   . Colon cancer Neg Hx   . Prostate cancer Neg Hx     Allergies  Allergen Reactions  . Morphine Other (See Comments)    Resp distress,  rash, tachycardia  . Celebrex [Celecoxib] Nausea Only  . Doxycycline Other (See Comments)    Elevated LFT's  . Wellbutrin [Bupropion] Hives    Health Maintenance  Topic Date Due  . COLONOSCOPY  02/05/2009  . TETANUS/TDAP  01/03/2027  . INFLUENZA VACCINE  Completed  . Hepatitis C Screening  Completed  . HIV Screening  Completed    Objective:  Vitals:   10/16/17 1347  BP: (!) 157/81  Pulse: 71  Temp: (!) 97.5 F (36.4 C)  TempSrc: Oral  Weight: 191 lb (86.6 kg)  Height: 5\' 8"  (1.727 m)   Body mass index is 29.04 kg/m.  Physical Exam  Constitutional: He is oriented to person, place, and time.  He is in good spirits as usual. His weight is up 11 pounds over  the past year.  HENT:  Mouth/Throat: No oropharyngeal exudate.  Eyes: Conjunctivae are normal.  Cardiovascular: Normal rate and regular rhythm.  No murmur heard. Pulmonary/Chest: Effort normal and breath sounds normal. He has no wheezes. He has no rales.  Abdominal: Soft. He exhibits no mass. There is no tenderness.  Musculoskeletal: Normal range of motion.  Neurological: He is alert and oriented to person, place, and time.  Skin: No rash noted.  Psychiatric: Mood and affect normal.    Lab Results Lab Results  Component Value Date   WBC 7.3 10/02/2017   HGB 16.7 10/02/2017   HCT 46.0 10/02/2017   MCV 91.6 10/02/2017   PLT 203 10/02/2017    Lab Results  Component Value Date   CREATININE 1.29 10/02/2017   BUN 14 10/02/2017   NA 141 10/02/2017   K 4.2 10/02/2017   CL 104 10/02/2017   CO2 31 10/02/2017    Lab Results  Component Value Date   ALT 25 10/02/2017   AST 22 10/02/2017   ALKPHOS 50 04/19/2017   BILITOT 0.6 10/02/2017    Lab Results  Component Value Date   CHOL 161 04/19/2017   HDL 28 (L) 04/19/2017   LDLCALC 85 04/19/2017   TRIG 239 (H) 04/19/2017   CHOLHDL 5.8 (H) 04/19/2017   Lab Results  Component Value Date   LABRPR NON REAC 04/19/2017   HIV 1 RNA Quant (copies/mL)  Date Value  10/02/2017 47 (H)  04/19/2017 <20 NOT DETECTED  09/28/2016 42 (H)   CD4 T Cell Abs (/uL)  Date Value  10/02/2017 600  04/19/2017 430  09/28/2016 630     Problem List Items Addressed This Visit      High   Depression   Relevant Medications   citalopram (CELEXA) 20 MG tablet   elvitegravir-cobicistat-emtricitabine-tenofovir (GENVOYA) 150-150-200-10 MG TABS tablet   Human immunodeficiency virus (HIV) disease ([place])    His infection is under very good long-term control. He will continue Genvoya and get repeat lab work in 6 months.      Relevant Medications   elvitegravir-cobicistat-emtricitabine-tenofovir (GENVOYA) 150-150-200-10 MG TABS tablet   Other Relevant  Orders   T-helper cell (CD4)- ([facility] clinic only)   HIV 1 RNA quant-no reflex-bld   Comprehensive metabolic panel   CBC   Lipid panel   RPR        Michel Bickers, MD [facility] for [place] [phone] pager   [phone] cell 10/16/2017, 2:10 PM

## 2017-10-26 MED FILL — GENVOYA TABLET: 150-150-200 | 30 days supply | Qty: 30 | Fill #0

## 2017-11-23 NOTE — Progress Notes (Deleted)
HPI: FU presyncope, chest pain and palpitations. Laboratories July 2018 showed normal TSH, Hemoglobin 16 and potassium 4.2.Echocardiogram August 2018 showed ejection fraction 55-60%, mild left ventricular hypertrophy, grade 1 diastolic dysfunction, trace aortic insufficiency, mild mitral regurgitation and mild left atrial enlargement. Seen in ER 8/18 with CP; troponin and D dimer negative. Monitor 8/18 showed no significant arrhythmia. ETT 8/18 normal.  Pulmonary function test October 2018 showed severe obstruction and decreased vital capacity.  Patient was seen by pulmonary and his dyspnea felt possibly secondary to gastroesophageal reflux disease.  Since last seen,   Current Outpatient Medications  Medication Sig Dispense Refill  . citalopram (CELEXA) 20 MG tablet Take 1 tablet (20 mg total) by mouth daily. 90 tablet 3  . elvitegravir-cobicistat-emtricitabine-tenofovir (GENVOYA) 150-150-200-10 MG TABS tablet Take 1 tablet by mouth daily with breakfast. 90 tablet 3  . methocarbamol (ROBAXIN) 500 MG tablet Take 500 mg by mouth as needed.    Marland Kitchen VIAGRA 100 MG tablet TAKE 1/2-1 TABLET (50-100 MG TOTAL) BY MOUTH DAILY AS NEEDED FOR ERECTILE DYSFUNCTION. 10 tablet 6   No current facility-administered medications for this visit.      Past Medical History:  Diagnosis Date  . DEPRESSION   . HIV DISEASE   . IBS (irritable bowel syndrome)   . Sarcoidosis 2000   question of    Past Surgical History:  Procedure Laterality Date  . LUNG SURGERY  2000   vats  . TONSILLECTOMY AND ADENOIDECTOMY      Social History   Socioeconomic History  . Marital status: Married    Spouse name: Not on file  . Number of children: 2  . Years of education: Not on file  . Highest education level: Not on file  Social Needs  . Financial resource strain: Not on file  . Food insecurity - worry: Not on file  . Food insecurity - inability: Not on file  . Transportation needs - medical: Not on file  .  Transportation needs - non-medical: Not on file  Occupational History  . 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 Smoking status: Former Smoker    Packs/day: 1.00    Years: 2.00    Pack years: 2.00    Types: Cigarettes    Last attempt to quit: 11/01/1983    Years since quitting: 34.0  . Smokeless tobacco: Never Used  Substance and Sexual Activity  . Alcohol use: Yes    Alcohol/week: 0.6 oz    Types: 1 Standard drinks or equivalent per week    Comment: socially   . Drug use: No  . Sexual activity: Yes    Comment: declined condoms  Other Topics Concern  . Not on file  Social History Narrative   [name]   1 daughter married    Family History  Problem Relation Age of Onset  . Hyperlipidemia Mother   . Hypertension Mother   . Cancer Mother        glioblastoma  . Sudden death Neg Hx   . Heart attack Neg Hx   . Diabetes Neg Hx   . Colon cancer Neg Hx   . Prostate cancer Neg Hx     ROS: no fevers or chills, productive cough, hemoptysis, dysphasia, odynophagia, melena, hematochezia, dysuria, hematuria, rash, seizure activity, orthopnea, PND, pedal edema, claudication. Remaining systems are negative.  Physical Exam: Well-developed well-nourished in no acute distress.  Skin is warm  and dry.  HEENT is normal.  Neck is supple.  Chest is clear to auscultation with normal expansion.  Cardiovascular exam is regular rate and rhythm.  Abdominal exam nontender or distended. No masses palpated. Extremities show no edema. neuro grossly intact  ECG- personally reviewed  A/P  1  Kirk Ruths, MD

## 2017-11-30 MED FILL — GENVOYA TABLET: 150-150-200 | 30 days supply | Qty: 30 | Fill #1

## 2017-12-04 ENCOUNTER — Ambulatory Visit: Payer: 59 | Admitting: Cardiology

## 2018-01-01 MED FILL — GENVOYA TABLET: 150-150-200 | 30 days supply | Qty: 30 | Fill #2

## 2018-01-04 ENCOUNTER — Encounter: Payer: Self-pay | Admitting: Internal Medicine

## 2018-01-04 ENCOUNTER — Ambulatory Visit (INDEPENDENT_AMBULATORY_CARE_PROVIDER_SITE_OTHER): Payer: 59 | Admitting: Internal Medicine

## 2018-01-04 VITALS — BP 118/76 | HR 76 | Temp 98.0°F | Resp 14 | Ht 68.0 in | Wt 194.5 lb

## 2018-01-04 DIAGNOSIS — Z0000 Encounter for general adult medical examination without abnormal findings: Secondary | ICD-10-CM | POA: Diagnosis not present

## 2018-01-04 LAB — LIPID PANEL
Cholesterol: 168 mg/dL (ref 0–200)
HDL: 30.4 mg/dL — ABNORMAL LOW (ref >39.00)
NonHDL: 137.7
Total CHOL/HDL Ratio: 6
Triglycerides: 308 mg/dL — ABNORMAL HIGH (ref 0.0–149.0)
VLDL: 61.6 mg/dL — AB (ref 0.0–40.0)

## 2018-01-04 LAB — LDL CHOLESTEROL, DIRECT: LDL DIRECT: 86 mg/dL

## 2018-01-04 LAB — PSA: PSA: 0.6 ng/mL (ref 0.10–4.00)

## 2018-01-04 NOTE — Progress Notes (Signed)
Subjective:    Patient ID: Bruce Woods, male    DOB: 1960-12-28, 57 y.o.   MRN: [id number]  DOS:  01/04/2018 Type of visit - description : cpx Interval history: In general feels well, has few concerns, see below   Review of Systems  Concern about skin lesion at the lower abdomen, feels irritated at times and bleeds when he scratches. Still having occasional chest pain: 3 times in the last 2 months. Symptoms are at rest, no associated shortness of breath, palpitations, cough.  Last about 30 minutes.  Other than above, a 14 point review of systems is negative     Past Medical History:  Diagnosis Date  . DEPRESSION   . HIV DISEASE   . IBS (irritable bowel syndrome)   . Sarcoidosis 2000   question of    Past Surgical History:  Procedure Laterality Date  . LUNG SURGERY  2000   vats  . TONSILLECTOMY AND ADENOIDECTOMY      Social History   Socioeconomic History  . Marital status: Married    Spouse name: Not on file  . Number of children: 2  . Years of education: Not on file  . Highest education level: Not on file  Social Needs  . Financial resource strain: Not on file  . Food insecurity - worry: Not on file  . Food insecurity - inability: Not on file  . Transportation needs - medical: Not on file  . Transportation needs - non-medical: Not on file  Occupational History  . 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 Smoking status: Former Smoker    Packs/day: 1.00    Years: 2.00    Pack years: 2.00    Types: Cigarettes    Last attempt to quit: 11/01/1983    Years since quitting: 34.2  . Smokeless tobacco: Never Used  Substance and Sexual Activity  . Alcohol use: Yes    Alcohol/week: 0.6 oz    Types: 1 Standard drinks or equivalent per week    Comment: socially   . Drug use: No  . Sexual activity: Yes    Comment: declined condoms  Other Topics Concern  . Not on file  Social History Narrative   [name]   1  daughter married     Family History  Problem Relation Age of Onset  . Hyperlipidemia Mother   . Hypertension Mother   . Cancer Mother        glioblastoma  . Sudden death Neg Hx   . Heart attack Neg Hx   . Diabetes Neg Hx   . Colon cancer Neg Hx   . Prostate cancer Neg Hx      Allergies as of 01/04/2018      Reactions   Morphine Other (See Comments)   Resp distress, rash, tachycardia   Celebrex [celecoxib] Nausea Only   Doxycycline Other (See Comments)   Elevated LFT's   Wellbutrin [bupropion] Hives      Medication List        Accurate as of 01/04/18  4:11 PM. Always use your most recent med list.          citalopram 20 MG tablet Commonly known as:  CELEXA Take 1 tablet (20 mg total) by mouth daily.   elvitegravir-cobicistat-emtricitabine-tenofovir 150-150-200-10 MG Tabs tablet Commonly known as:  GENVOYA Take 1 tablet by mouth daily with breakfast.   methocarbamol 500 MG tablet Commonly known as:  ROBAXIN Take 500 mg by mouth as needed.   VIAGRA 100 MG tablet Generic drug:  sildenafil TAKE 1/2-1 TABLET (50-100 MG TOTAL) BY MOUTH DAILY AS NEEDED FOR ERECTILE DYSFUNCTION.          Objective:   Physical Exam  Skin:      BP 118/76 (BP Location: Left Arm, Patient Position: Sitting, Cuff Size: Small)   Pulse 76   Temp 98 F (36.7 C) (Oral)   Resp 14   Ht 5\' 8"  (1.727 m)   Wt 194 lb 8 oz (88.2 kg)   SpO2 98%   BMI 29.57 kg/m  General:   Well developed, well nourished . NAD.  Neck: No  thyromegaly  HEENT:  Normocephalic . Face symmetric, atraumatic Lungs:  CTA B Normal respiratory effort, no intercostal retractions, no accessory muscle use. Heart: RRR,  no murmur.  No pretibial edema bilaterally  Abdomen:  Not distended, soft, non-tender. No rebound or rigidity.   Rectal: External abnormalities: none. Normal sphincter tone. No rectal masses or tenderness.  No  stools Prostate: Prostate gland firm and smooth, no enlargement, nodularity,  tenderness, mass, asymmetry or induration Skin: Exposed areas without rash. Not pale. Not jaundice Neurologic:  alert & oriented X3.  Speech normal, gait appropriate for age and unassisted Strength symmetric and appropriate for age.  Psych: Cognition and judgment appear intact.  Cooperative with normal attention span and concentration.  Behavior appropriate. No anxious or depressed appearing.     Assessment & Plan:    Assessment: HIV + Depression ED Vitamin D deficiency ?sarcoidosis dx 2000  PLAN  Skin lesions: Consistent with SK, we talk about possibly Derm referral but eventually we agreed on observation, he has a dermatologist and will reach out to them if any changes. Presyncope episodes: No further Chest pain: As described above, has already seen cardiology and pulmonary. Chest x-ray, echocardiogram, GTX, cardiac event monitor negative.  Cardiology was planning a cardiac CTA but test was not done. Patient took PPIs as recommended by pulmonary and he does not feel that while in PPI sxs were less frequent or different. Plan:  proceed with cardiac CTA ?  Patient declined it, we agreed on observation, reassess in 4 months and call if symptoms severe/different/intense RTC 4 months

## 2018-01-04 NOTE — Patient Instructions (Signed)
GO TO THE LAB : Get the blood work     GO TO THE FRONT DESK Schedule your next appointment for a   checkup in 4 months  

## 2018-01-04 NOTE — Assessment & Plan Note (Signed)
Skin lesions: Consistent with SK, we talk about possibly Derm referral but eventually we agreed on observation, he has a dermatologist and will reach out to them if any changes. Presyncope episodes: No further Chest pain: As described above, has already seen cardiology and pulmonary. Chest x-ray, echocardiogram, GTX, cardiac event monitor negative.  Cardiology was planning a cardiac CTA but test was not done. Patient took PPIs as recommended by pulmonary and he does not feel that while in PPI sxs were less frequent or different. Plan:  proceed with cardiac CTA ?  Patient declined it, we agreed on observation, reassess in 4 months and call if symptoms severe/different/intense RTC 4 months

## 2018-01-04 NOTE — Assessment & Plan Note (Addendum)
-  Immunizations: reviewed - DRE normal today, check a PSA.  -cscope 2010, Dr Shary Key [facility] [place] [place]) -Labs: FL P, PSA -Diet and exercise discussed.  Currently walks in the treadmill twice a week, recommend to increase it to 3 or 4 times a week

## 2018-01-04 NOTE — Progress Notes (Signed)
Pre visit review using our clinic review tool, if applicable. No additional management support is needed unless otherwise documented below in the visit note. 

## 2018-01-24 MED FILL — CITALOPRAM HBR 20 MG TABLET: 20 | 90 days supply | Qty: 90 | Fill #1

## 2018-01-24 MED FILL — GENVOYA TABLET: 150-150-200 | 30 days supply | Qty: 30 | Fill #3

## 2018-02-26 MED FILL — GENVOYA TABLET: 150-150-200 | 30 days supply | Qty: 30 | Fill #4

## 2018-03-08 ENCOUNTER — Other Ambulatory Visit: Payer: Self-pay | Admitting: Internal Medicine

## 2018-03-08 MED FILL — SILDENAFIL CITRATE 100 MG T: 100 | 30 days supply | Qty: 6 | Fill #0

## 2018-03-29 MED FILL — GENVOYA TABLET: 150-150-200 | 30 days supply | Qty: 30 | Fill #5

## 2018-04-03 ENCOUNTER — Encounter: Payer: Self-pay | Admitting: Family Medicine

## 2018-04-03 ENCOUNTER — Ambulatory Visit: Payer: 59 | Admitting: Family Medicine

## 2018-04-03 DIAGNOSIS — M25521 Pain in right elbow: Secondary | ICD-10-CM

## 2018-04-03 MED ORDER — PREDNISONE 10 MG PO TABS: ORAL_TABLET | ORAL | 0 refills | Status: DC

## 2018-04-03 MED FILL — predniSONE 10 MG TABS: 10 | 6 days supply | Qty: 21 | Fill #0

## 2018-04-03 NOTE — Patient Instructions (Signed)
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 Unfortunately these don't typically respond to conservative treatment. Start prednisone dose pack for 6 days. Elbow sleeve can help prevent the flexion that leads this to pop out of place. Avoid ice/heat to this. We will refer you to Dr. Amedeo Plenty for treatment.

## 2018-04-04 ENCOUNTER — Encounter: Payer: Self-pay | Admitting: Family Medicine

## 2018-04-04 DIAGNOSIS — M25521 Pain in right elbow: Secondary | ICD-10-CM | POA: Insufficient documentation

## 2018-04-04 NOTE — Progress Notes (Signed)
PCP: Colon Branch, MD  Subjective:   HPI: Patient is a 57 y.o. male here for right forearm/elbow pain.  Patient reports for about 1 month now he's had pain with associated 5th digit numbness in his right wrist to elbow and hand. Pain is a throbbing, currently only 2/10 level. Tried wrist splint, robaxin, pain medication without much benefit. No acute injury - thought he slept weird before this started. No skin changes.  Past Medical History:  Diagnosis Date  . DEPRESSION   . HIV DISEASE   . IBS (irritable bowel syndrome)   . Sarcoidosis 2000   question of    Current Outpatient Medications on File Prior to Visit  Medication Sig Dispense Refill  . citalopram (CELEXA) 20 MG tablet Take 1 tablet (20 mg total) by mouth daily. 90 tablet 3  . elvitegravir-cobicistat-emtricitabine-tenofovir (GENVOYA) 150-150-200-10 MG TABS tablet Take 1 tablet by mouth daily with breakfast. 90 tablet 3  . methocarbamol (ROBAXIN) 500 MG tablet Take 500 mg by mouth as needed.    . sildenafil (VIAGRA) 100 MG tablet TAKE 1/2 TO 1 TABLET BY MOUTH DAILY AS NEEDED FOR ERECTILE DYSFUNCTION 10 tablet 6   No current facility-administered medications on file prior to visit.     Past Surgical History:  Procedure Laterality Date  . LUNG SURGERY  2000   vats  . TONSILLECTOMY AND ADENOIDECTOMY      Allergies  Allergen Reactions  . Morphine Other (See Comments)    Resp distress, rash, tachycardia  . Celebrex [Celecoxib] Nausea Only  . Doxycycline Other (See Comments)    Elevated LFT's  . Wellbutrin [Bupropion] Hives    Social History   Socioeconomic History  . Marital status: Married    Spouse name: Not on file  . Number of children: 2  . Years of education: Not on file  . Highest education level: Not on file  Occupational History  . 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 Financial resource strain: Not on file  . Food insecurity:    Worry: Not on file     Inability: Not on file  . Transportation needs:    Medical: Not on file    Non-medical: Not on file  Tobacco Use  . Smoking status: Former Smoker    Packs/day: 1.00    Years: 2.00    Pack years: 2.00    Types: Cigarettes    Last attempt to quit: 11/01/1983    Years since quitting: 34.4  . Smokeless tobacco: Never Used  Substance and Sexual Activity  . Alcohol use: Yes    Alcohol/week: 0.6 oz    Types: 1 Standard drinks or equivalent per week    Comment: socially   . Drug use: No  . Sexual activity: Yes    Comment: declined condoms  Lifestyle  . Physical activity:    Days per week: Not on file    Minutes per session: Not on file  . Stress: Not on file  Relationships  . Social connections:    Talks on phone: Not on file    Gets together: Not on file    Attends religious service: Not on file    Active member of club or organization: Not on file    Attends meetings of clubs or organizations: Not on file    Relationship status: Not on file  . Intimate partner violence:    Fear of current or ex partner: Not  on file    Emotionally abused: Not on file    Physically abused: Not on file    Forced sexual activity: Not on file  Other Topics Concern  . Not on file  Social History Narrative   [name]   1 daughter married    Family History  Problem Relation Age of Onset  . Hyperlipidemia Mother   . Hypertension Mother   . Cancer Mother        glioblastoma  . Sudden death Neg Hx   . Heart attack Neg Hx   . Diabetes Neg Hx   . Colon cancer Neg Hx   . Prostate cancer Neg Hx     BP 130/75   Pulse 89   Ht 5\' 8"  (1.727 m)   Wt 185 lb (83.9 kg)   BMI 28.13 kg/m   Review of Systems: See HPI above.     Objective:  Physical Exam:  Gen: NAD, comfortable in exam room  Right elbow: Ulnar nerve felt dislocating on extension to flexion of the elbow.  No bruising, swelling, other deformity. TTP cubital tunnel.  No other tenderness of wrist, forearm,  hand. FROM wrist and elbow with 5/5 strength. Collateral ligaments intact. Sensation intact to light touch currently upper extremity. NVI distally.  Left elbow: No deformity. FROM with 5/5 strength. No tenderness to palpation. NVI distally.   Assessment & Plan:  1. 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  He will start prednisone dose pack with elbow sleeve to help prevent flexion that leads to dislocation.  Avoid ice/heat.  Will refer to ortho for surgical intervention.

## 2018-04-04 NOTE — Assessment & Plan Note (Signed)
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  He will start prednisone dose pack with elbow sleeve to help prevent flexion that leads to dislocation.  Avoid ice/heat.  Will refer to ortho for surgical intervention.

## 2018-04-09 MED FILL — PENICILLIN VK 500 MG TABLET: 500 | 7 days supply | Qty: 28 | Fill #0

## 2018-04-09 MED FILL — NAPROXEN 500 MG TABLET: 500 | 4 days supply | Qty: 12 | Fill #0

## 2018-04-10 ENCOUNTER — Other Ambulatory Visit: Payer: 59

## 2018-04-10 DIAGNOSIS — B20 Human immunodeficiency virus [HIV] disease: Secondary | ICD-10-CM | POA: Diagnosis not present

## 2018-04-11 LAB — COMPREHENSIVE METABOLIC PANEL
AG RATIO: 1.6 (calc) (ref 1.0–2.5)
ALBUMIN MSPROF: 4.1 g/dL (ref 3.6–5.1)
ALT: 20 U/L (ref 9–46)
AST: 15 U/L (ref 10–35)
Alkaline phosphatase (APISO): 58 U/L (ref 40–115)
BUN: 18 mg/dL (ref 7–25)
CHLORIDE: 100 mmol/L (ref 98–110)
CO2: 30 mmol/L (ref 20–32)
CREATININE: 1.1 mg/dL (ref 0.70–1.33)
Calcium: 8.9 mg/dL (ref 8.6–10.3)
GLOBULIN: 2.5 g/dL (ref 1.9–3.7)
GLUCOSE: 98 mg/dL (ref 65–99)
Potassium: 4 mmol/L (ref 3.5–5.3)
SODIUM: 138 mmol/L (ref 135–146)
TOTAL PROTEIN: 6.6 g/dL (ref 6.1–8.1)
Total Bilirubin: 0.9 mg/dL (ref 0.2–1.2)

## 2018-04-11 LAB — LIPID PANEL
Cholesterol: 195 mg/dL (ref <200)
HDL: 34 mg/dL — ABNORMAL LOW (ref >40)
LDL Cholesterol (Calc): 118 mg/dL (calc) — ABNORMAL HIGH
Non-HDL Cholesterol (Calc): 161 mg/dL (calc) — ABNORMAL HIGH (ref <130)
Total CHOL/HDL Ratio: 5.7 (calc) — ABNORMAL HIGH (ref <5.0)
Triglycerides: 302 mg/dL — ABNORMAL HIGH (ref <150)

## 2018-04-11 LAB — CBC
HCT: 44 % (ref 38.5–50.0)
HEMOGLOBIN: 16 g/dL (ref 13.2–17.1)
MCH: 33.5 pg — ABNORMAL HIGH (ref 27.0–33.0)
MCHC: 36.4 g/dL — ABNORMAL HIGH (ref 32.0–36.0)
MCV: 92.2 fL (ref 80.0–100.0)
MPV: 10.8 fL (ref 7.5–12.5)
PLATELETS: 160 10*3/uL (ref 140–400)
RBC: 4.77 10*6/uL (ref 4.20–5.80)
RDW: 13 % (ref 11.0–15.0)
WBC: 7.3 10*3/uL (ref 3.8–10.8)

## 2018-04-11 LAB — T-HELPER CELL (CD4) - (RCID CLINIC ONLY)
CD4 % Helper T Cell: 30 % — ABNORMAL LOW (ref 33–55)
CD4 T Cell Abs: 500 /uL (ref 400–2700)

## 2018-04-11 LAB — RPR: RPR: NONREACTIVE

## 2018-04-12 ENCOUNTER — Encounter: Payer: Self-pay | Admitting: Family Medicine

## 2018-04-12 ENCOUNTER — Encounter: Payer: Self-pay | Admitting: Cardiology

## 2018-04-12 LAB — HIV-1 RNA QUANT-NO REFLEX-BLD
HIV 1 RNA Quant: <20 copies/mL — AB
HIV-1 RNA QUANT, LOG: DETECTED {Log_copies}/mL — AB

## 2018-04-17 ENCOUNTER — Encounter: Payer: Self-pay | Admitting: Internal Medicine

## 2018-04-20 DIAGNOSIS — G5621 Lesion of ulnar nerve, right upper limb: Secondary | ICD-10-CM | POA: Diagnosis not present

## 2018-04-27 MED FILL — CITALOPRAM HBR 20 MG TABLET: 20 | 90 days supply | Qty: 90 | Fill #2

## 2018-04-27 MED FILL — GENVOYA TABLET: 150-150-200 | 30 days supply | Qty: 30 | Fill #6

## 2018-05-02 ENCOUNTER — Encounter: Payer: Self-pay | Admitting: Internal Medicine

## 2018-05-02 DIAGNOSIS — G5621 Lesion of ulnar nerve, right upper limb: Secondary | ICD-10-CM | POA: Diagnosis not present

## 2018-05-02 MED FILL — METHOCARBAMOL 500 MG TABLET: 500 | 10 days supply | Qty: 40 | Fill #0

## 2018-05-04 ENCOUNTER — Other Ambulatory Visit: Payer: Self-pay | Admitting: Orthopedic Surgery

## 2018-05-10 ENCOUNTER — Ambulatory Visit: Payer: 59 | Admitting: Internal Medicine

## 2018-05-10 ENCOUNTER — Encounter: Payer: Self-pay | Admitting: Internal Medicine

## 2018-05-10 VITALS — BP 126/68 | HR 64 | Temp 98.0°F | Resp 16 | Ht 68.0 in | Wt 193.0 lb

## 2018-05-10 DIAGNOSIS — M25529 Pain in unspecified elbow: Secondary | ICD-10-CM | POA: Diagnosis not present

## 2018-05-10 DIAGNOSIS — F3342 Major depressive disorder, recurrent, in full remission: Secondary | ICD-10-CM | POA: Diagnosis not present

## 2018-05-10 DIAGNOSIS — R079 Chest pain, unspecified: Secondary | ICD-10-CM | POA: Diagnosis not present

## 2018-05-10 NOTE — Patient Instructions (Signed)
   GO TO THE FRONT DESK Schedule your next appointment for a   Physical by 12/2018

## 2018-05-10 NOTE — Progress Notes (Signed)
Pre visit review using our clinic review tool, if applicable. No additional management support is needed unless otherwise documented below in the visit note. 

## 2018-05-10 NOTE — Progress Notes (Signed)
Subjective:    Patient ID: Bruce Woods, male    DOB: 30-Aug-1961, 57 y.o.   MRN: [id number]  DOS:  05/10/2018 Type of visit - description : f/u Interval history: Since the last office visit he is doing well. Having surgery on his elbow soon. Good med compliance Recently triglycerides were slightly elevated, he is working on better diet   Review of Systems No further chest pain after he started Zantac. Able to walk 2 miles without DOE, cough.  Feeling well.  Past Medical History:  Diagnosis Date  . DEPRESSION   . HIV DISEASE   . IBS (irritable bowel syndrome)   . Sarcoidosis 2000   question of    Past Surgical History:  Procedure Laterality Date  . LUNG SURGERY  2000   vats  . TONSILLECTOMY AND ADENOIDECTOMY      Social History   Socioeconomic History  . Marital status: Married    Spouse name: Not on file  . Number of children: 2  . Years of education: Not on file  . Highest education level: Not on file  Occupational History  . 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 Financial resource strain: Not on file  . Food insecurity:    Worry: Not on file    Inability: Not on file  . Transportation needs:    Medical: Not on file    Non-medical: Not on file  Tobacco Use  . Smoking status: Former Smoker    Packs/day: 1.00    Years: 2.00    Pack years: 2.00    Types: Cigarettes    Last attempt to quit: 11/01/1983    Years since quitting: 34.5  . Smokeless tobacco: Never Used  Substance and Sexual Activity  . Alcohol use: Yes    Alcohol/week: 0.6 oz    Types: 1 Standard drinks or equivalent per week    Comment: socially   . Drug use: No  . Sexual activity: Yes    Comment: declined condoms  Lifestyle  . Physical activity:    Days per week: Not on file    Minutes per session: Not on file  . Stress: Not on file  Relationships  . Social connections:    Talks on phone: Not on file    Gets together: Not on file   Attends religious service: Not on file    Active member of club or organization: Not on file    Attends meetings of clubs or organizations: Not on file    Relationship status: Not on file  . Intimate partner violence:    Fear of current or ex partner: Not on file    Emotionally abused: Not on file    Physically abused: Not on file    Forced sexual activity: Not on file  Other Topics Concern  . Not on file  Social History Narrative   [name]   1 daughter married      Allergies as of 05/10/2018      Reactions   Morphine Other (See Comments)   Resp distress, rash, tachycardia   Celebrex [celecoxib] Nausea Only   Doxycycline Other (See Comments)   Elevated LFT's   Wellbutrin [bupropion] Hives      Medication List        Accurate as of 05/10/18 11:59 PM. Always use your most recent med list.          citalopram 20 MG  tablet Commonly known as:  CELEXA Take 1 tablet (20 mg total) by mouth daily.   elvitegravir-cobicistat-emtricitabine-tenofovir 150-150-200-10 MG Tabs tablet Commonly known as:  GENVOYA Take 1 tablet by mouth daily with breakfast.   methocarbamol 500 MG tablet Commonly known as:  ROBAXIN Take 500 mg by mouth as needed.   ranitidine 150 MG tablet Commonly known as:  ZANTAC Take 150 mg by mouth daily.   sildenafil 100 MG tablet Commonly known as:  VIAGRA TAKE 1/2 TO 1 TABLET BY MOUTH DAILY AS NEEDED FOR ERECTILE DYSFUNCTION          Objective:   Physical Exam BP 126/68 (BP Location: Left Arm, Patient Position: Sitting, Cuff Size: Small)   Pulse 64   Temp 98 F (36.7 C) (Oral)   Resp 16   Ht 5\' 8"  (1.727 m)   Wt 193 lb (87.5 kg)   SpO2 98%   BMI 29.35 kg/m  General:   Well developed, NAD, see BMI.  HEENT:  Normocephalic . Face symmetric, atraumatic Lungs:  CTA B Normal respiratory effort, no intercostal retractions, no accessory muscle use. Heart: RRR,  no murmur.  No pretibial edema bilaterally  Skin: Not pale. Not  jaundice Neurologic:  alert & oriented X3.  Speech normal, gait appropriate for age and unassisted Psych--  Cognition and judgment appear intact.  Cooperative with normal attention span and concentration.  Behavior appropriate. No anxious or depressed appearing.      Assessment & Plan:   Assessment: HIV + Depression ED Vitamin D deficiency ?sarcoidosis dx 2000 CP 2018,s/p cardiology and pulmonary visits.: CXR,ECHO, GTX, event monitor (-)   PLAN  Skin lesions: Saw dermatology, reports he had a couple of biopsies Chest pain: Resolved, he thinks he got better after he added Zantac to his regimen. Elbow pain: To have surgery 05/21/2018, right ulnar decompression with Dr. Fredna Dow Depression: Controlled on citalopram RTC 3-20 20 CPX

## 2018-05-11 NOTE — Assessment & Plan Note (Signed)
Skin lesions: Saw dermatology, reports he had a couple of biopsies Chest pain: Resolved, he thinks he got better after he added Zantac to his regimen. Elbow pain: To have surgery 05/21/2018, right ulnar decompression with Dr. Fredna Dow Depression: Controlled on citalopram RTC 3-20 20 CPX

## 2018-05-14 ENCOUNTER — Encounter (HOSPITAL_BASED_OUTPATIENT_CLINIC_OR_DEPARTMENT_OTHER): Payer: Self-pay | Admitting: *Deleted

## 2018-05-14 ENCOUNTER — Other Ambulatory Visit: Payer: Self-pay

## 2018-05-17 ENCOUNTER — Encounter (HOSPITAL_BASED_OUTPATIENT_CLINIC_OR_DEPARTMENT_OTHER): Payer: Self-pay

## 2018-05-17 ENCOUNTER — Ambulatory Visit (HOSPITAL_BASED_OUTPATIENT_CLINIC_OR_DEPARTMENT_OTHER)
Admission: RE | Admit: 2018-05-17 | Discharge: 2018-05-17 | Disposition: A | Discharge status: Home / Self Care | Payer: 59 | Source: Ambulatory Visit | Attending: Orthopedic Surgery | Admitting: Orthopedic Surgery

## 2018-05-17 ENCOUNTER — Other Ambulatory Visit: Payer: Self-pay

## 2018-05-17 ENCOUNTER — Ambulatory Visit (HOSPITAL_BASED_OUTPATIENT_CLINIC_OR_DEPARTMENT_OTHER): Payer: 59 | Admitting: Certified Registered"

## 2018-05-17 ENCOUNTER — Encounter (HOSPITAL_BASED_OUTPATIENT_CLINIC_OR_DEPARTMENT_OTHER)
Admission: RE | Disposition: A | Discharge status: Home / Self Care | Payer: Self-pay | Source: Ambulatory Visit | Attending: Orthopedic Surgery

## 2018-05-17 DIAGNOSIS — Z808 Family history of malignant neoplasm of other organs or systems: Secondary | ICD-10-CM | POA: Insufficient documentation

## 2018-05-17 DIAGNOSIS — K219 Gastro-esophageal reflux disease without esophagitis: Secondary | ICD-10-CM | POA: Diagnosis not present

## 2018-05-17 DIAGNOSIS — F329 Major depressive disorder, single episode, unspecified: Secondary | ICD-10-CM | POA: Insufficient documentation

## 2018-05-17 DIAGNOSIS — G8918 Other acute postprocedural pain: Secondary | ICD-10-CM | POA: Diagnosis not present

## 2018-05-17 DIAGNOSIS — G5621 Lesion of ulnar nerve, right upper limb: Secondary | ICD-10-CM | POA: Diagnosis not present

## 2018-05-17 DIAGNOSIS — Z21 Asymptomatic human immunodeficiency virus [HIV] infection status: Secondary | ICD-10-CM | POA: Diagnosis not present

## 2018-05-17 DIAGNOSIS — Z79899 Other long term (current) drug therapy: Secondary | ICD-10-CM | POA: Insufficient documentation

## 2018-05-17 DIAGNOSIS — K589 Irritable bowel syndrome without diarrhea: Secondary | ICD-10-CM | POA: Diagnosis not present

## 2018-05-17 DIAGNOSIS — Z87891 Personal history of nicotine dependence: Secondary | ICD-10-CM | POA: Insufficient documentation

## 2018-05-17 DIAGNOSIS — Z8249 Family history of ischemic heart disease and other diseases of the circulatory system: Secondary | ICD-10-CM | POA: Insufficient documentation

## 2018-05-17 HISTORY — PX: ULNAR NERVE TRANSPOSITION: SHX2595

## 2018-05-17 HISTORY — DX: Gastro-esophageal reflux disease without esophagitis: K21.9

## 2018-05-17 HISTORY — DX: Lesion of ulnar nerve, right upper limb: G56.21

## 2018-05-17 SURGERY — ULNAR NERVE DECOMPRESSION/TRANSPOSITION
Anesthesia: General | Site: Elbow | Laterality: Right

## 2018-05-17 MED ORDER — DEXAMETHASONE SODIUM PHOSPHATE 10 MG/ML IJ SOLN
INTRAMUSCULAR | Status: DC | PRN
  Administered 2018-05-17: 10 mg via INTRAVENOUS

## 2018-05-17 MED ORDER — HYDROMORPHONE HCL 1 MG/ML IJ SOLN
INTRAMUSCULAR | Status: AC
  Filled 2018-05-17: qty 0.5

## 2018-05-17 MED ORDER — MEPERIDINE HCL 25 MG/ML IJ SOLN: 6.2500 mg | INTRAMUSCULAR | Status: DC | PRN

## 2018-05-17 MED ORDER — SUCCINYLCHOLINE CHLORIDE 20 MG/ML IJ SOLN
INTRAMUSCULAR | Status: DC | PRN
  Administered 2018-05-17: 120 mg via INTRAVENOUS

## 2018-05-17 MED ORDER — MIDAZOLAM HCL 2 MG/2ML IJ SOLN
1.0000 mg | INTRAMUSCULAR | Status: DC | PRN
  Administered 2018-05-17: 2 mg via INTRAVENOUS

## 2018-05-17 MED ORDER — DEXAMETHASONE SODIUM PHOSPHATE 10 MG/ML IJ SOLN
INTRAMUSCULAR | Status: AC
  Filled 2018-05-17: qty 1

## 2018-05-17 MED ORDER — SCOPOLAMINE 1 MG/3DAYS TD PT72: 1.0000 | MEDICATED_PATCH | Freq: Once | TRANSDERMAL | Status: DC | PRN

## 2018-05-17 MED ORDER — CEFAZOLIN SODIUM-DEXTROSE 2-4 GM/100ML-% IV SOLN
2.0000 g | INTRAVENOUS | Status: AC
  Administered 2018-05-17: 2 g via INTRAVENOUS

## 2018-05-17 MED ORDER — PROPOFOL 500 MG/50ML IV EMUL
INTRAVENOUS | Status: AC
  Filled 2018-05-17: qty 100

## 2018-05-17 MED ORDER — LIDOCAINE HCL (CARDIAC) PF 100 MG/5ML IV SOSY
PREFILLED_SYRINGE | INTRAVENOUS | Status: AC
  Filled 2018-05-17: qty 5

## 2018-05-17 MED ORDER — OXYCODONE HCL 5 MG/5ML PO SOLN: 5.0000 mg | Freq: Once | ORAL | Status: DC | PRN

## 2018-05-17 MED ORDER — FENTANYL CITRATE (PF) 100 MCG/2ML IJ SOLN
INTRAMUSCULAR | Status: AC
  Filled 2018-05-17: qty 2

## 2018-05-17 MED ORDER — CHLORHEXIDINE GLUCONATE 4 % EX LIQD: 60.0000 mL | Freq: Once | CUTANEOUS | Status: DC

## 2018-05-17 MED ORDER — OXYCODONE HCL 5 MG PO TABS: 5.0000 mg | ORAL_TABLET | Freq: Once | ORAL | Status: DC | PRN

## 2018-05-17 MED ORDER — LACTATED RINGERS IV SOLN
INTRAVENOUS | Status: DC
  Administered 2018-05-17: 11:00:00 via INTRAVENOUS

## 2018-05-17 MED ORDER — HYDROCODONE-ACETAMINOPHEN 5-325 MG PO TABS: ORAL_TABLET | ORAL | 0 refills | Status: DC

## 2018-05-17 MED ORDER — FENTANYL CITRATE (PF) 100 MCG/2ML IJ SOLN
50.0000 ug | INTRAMUSCULAR | Status: DC | PRN
  Administered 2018-05-17: 100 ug via INTRAVENOUS

## 2018-05-17 MED ORDER — PROMETHAZINE HCL 25 MG/ML IJ SOLN: 6.2500 mg | INTRAMUSCULAR | Status: DC | PRN

## 2018-05-17 MED ORDER — ROPIVACAINE HCL 5 MG/ML IJ SOLN
INTRAMUSCULAR | Status: DC | PRN
  Administered 2018-05-17: 30 mL via PERINEURAL

## 2018-05-17 MED ORDER — MIDAZOLAM HCL 2 MG/2ML IJ SOLN
INTRAMUSCULAR | Status: AC
  Filled 2018-05-17: qty 2

## 2018-05-17 MED ORDER — HYDROMORPHONE HCL 1 MG/ML IJ SOLN
0.2500 mg | INTRAMUSCULAR | Status: DC | PRN
  Administered 2018-05-17 (×2): 0.5 mg via INTRAVENOUS

## 2018-05-17 MED ORDER — CEFAZOLIN SODIUM-DEXTROSE 2-4 GM/100ML-% IV SOLN
INTRAVENOUS | Status: AC
  Filled 2018-05-17: qty 100

## 2018-05-17 MED ORDER — ONDANSETRON HCL 4 MG/2ML IJ SOLN
INTRAMUSCULAR | Status: DC | PRN
  Administered 2018-05-17: 4 mg via INTRAVENOUS

## 2018-05-17 MED ORDER — LIDOCAINE HCL (CARDIAC) PF 100 MG/5ML IV SOSY
PREFILLED_SYRINGE | INTRAVENOUS | Status: DC | PRN
  Administered 2018-05-17: 100 mg via INTRAVENOUS

## 2018-05-17 MED ORDER — PROPOFOL 10 MG/ML IV BOLUS
INTRAVENOUS | Status: DC | PRN
  Administered 2018-05-17 (×2): 100 mg via INTRAVENOUS
  Administered 2018-05-17: 200 mg via INTRAVENOUS

## 2018-05-17 MED ORDER — ONDANSETRON HCL 4 MG/2ML IJ SOLN
INTRAMUSCULAR | Status: AC
  Filled 2018-05-17: qty 2

## 2018-05-17 SURGICAL SUPPLY — 57 items
BANDAGE ACE 3X5.8 VEL STRL LF (GAUZE/BANDAGES/DRESSINGS) ×6 IMPLANT
BANDAGE ACE 4X5 VEL STRL LF (GAUZE/BANDAGES/DRESSINGS) ×3 IMPLANT
BLADE MINI RND TIP GREEN BEAV (BLADE) IMPLANT
BLADE SURG 15 STRL LF DISP TIS (BLADE) ×2 IMPLANT
BLADE SURG 15 STRL SS (BLADE) ×6
BNDG CMPR 9X4 STRL LF SNTH (GAUZE/BANDAGES/DRESSINGS) ×1
BNDG ESMARK 4X9 LF (GAUZE/BANDAGES/DRESSINGS) ×3 IMPLANT
BNDG GAUZE ELAST 4 BULKY (GAUZE/BANDAGES/DRESSINGS) ×5 IMPLANT
CHLORAPREP W/TINT 26ML (MISCELLANEOUS) ×3 IMPLANT
CORD BIPOLAR FORCEPS 12FT (ELECTRODE) ×3 IMPLANT
COVER BACK TABLE 60X90IN (DRAPES) ×3 IMPLANT
COVER MAYO STAND STRL (DRAPES) ×3 IMPLANT
CUFF TOURN SGL LL 18 NRW (TOURNIQUET CUFF) ×3 IMPLANT
CUFF TOURNIQUET SINGLE 18IN (TOURNIQUET CUFF) IMPLANT
DECANTER SPIKE VIAL GLASS SM (MISCELLANEOUS) IMPLANT
DRAPE EXTREMITY T 121X128X90 (DRAPE) ×3 IMPLANT
DRAPE SURG 17X23 STRL (DRAPES) ×3 IMPLANT
DRSG PAD ABDOMINAL 8X10 ST (GAUZE/BANDAGES/DRESSINGS) ×3 IMPLANT
GAUZE SPONGE 4X4 12PLY STRL (GAUZE/BANDAGES/DRESSINGS) ×3 IMPLANT
GAUZE SPONGE 4X4 16PLY XRAY LF (GAUZE/BANDAGES/DRESSINGS) IMPLANT
GAUZE XEROFORM 1X8 LF (GAUZE/BANDAGES/DRESSINGS) ×3 IMPLANT
GLOVE BIO SURGEON STRL SZ7.5 (GLOVE) ×3 IMPLANT
GLOVE BIOGEL M STRL SZ7.5 (GLOVE) ×2 IMPLANT
GLOVE BIOGEL PI IND STRL 8 (GLOVE) ×1 IMPLANT
GLOVE BIOGEL PI IND STRL 8.5 (GLOVE) ×1 IMPLANT
GLOVE BIOGEL PI INDICATOR 8 (GLOVE) ×4
GLOVE BIOGEL PI INDICATOR 8.5 (GLOVE) ×2
GLOVE EXAM NITRILE MD LF STRL (GLOVE) ×2 IMPLANT
GLOVE SURG ORTHO 8.0 STRL STRW (GLOVE) ×3 IMPLANT
GOWN STRL REUS W/ TWL LRG LVL3 (GOWN DISPOSABLE) ×1 IMPLANT
GOWN STRL REUS W/ TWL XL LVL3 (GOWN DISPOSABLE) IMPLANT
GOWN STRL REUS W/TWL LRG LVL3 (GOWN DISPOSABLE)
GOWN STRL REUS W/TWL XL LVL3 (GOWN DISPOSABLE) ×9 IMPLANT
NDL HYPO 25X1 1.5 SAFETY (NEEDLE) IMPLANT
NEEDLE HYPO 25X1 1.5 SAFETY (NEEDLE) IMPLANT
NS IRRIG 1000ML POUR BTL (IV SOLUTION) ×3 IMPLANT
PACK BASIN DAY SURGERY FS (CUSTOM PROCEDURE TRAY) ×3 IMPLANT
PAD CAST 3X4 CTTN HI CHSV (CAST SUPPLIES) ×1 IMPLANT
PAD CAST 4YDX4 CTTN HI CHSV (CAST SUPPLIES) ×1 IMPLANT
PADDING CAST ABS 4INX4YD NS (CAST SUPPLIES) ×2
PADDING CAST ABS COTTON 4X4 ST (CAST SUPPLIES) ×1 IMPLANT
PADDING CAST COTTON 3X4 STRL (CAST SUPPLIES) ×3
PADDING CAST COTTON 4X4 STRL (CAST SUPPLIES) ×3
SLEEVE SCD COMPRESS KNEE MED (MISCELLANEOUS) ×3 IMPLANT
SPLINT FAST PLASTER 5X30 (CAST SUPPLIES)
SPLINT PLASTER CAST FAST 5X30 (CAST SUPPLIES) IMPLANT
SPLINT PLASTER CAST XFAST 3X15 (CAST SUPPLIES) IMPLANT
SPLINT PLASTER XTRA FASTSET 3X (CAST SUPPLIES) ×40
STOCKINETTE 4X48 STRL (DRAPES) ×3 IMPLANT
SUT ETHILON 4 0 PS 2 18 (SUTURE) ×5 IMPLANT
SUT VIC AB 2-0 SH 27 (SUTURE) ×3
SUT VIC AB 2-0 SH 27XBRD (SUTURE) ×1 IMPLANT
SUT VICRYL 4-0 PS2 18IN ABS (SUTURE) ×2 IMPLANT
SYR BULB 3OZ (MISCELLANEOUS) ×3 IMPLANT
SYR CONTROL 10ML LL (SYRINGE) IMPLANT
TOWEL GREEN STERILE FF (TOWEL DISPOSABLE) ×3 IMPLANT
UNDERPAD 30X30 (UNDERPADS AND DIAPERS) ×3 IMPLANT

## 2018-05-17 NOTE — H&P (Signed)
  Bruce Woods is an 57 y.o. male.   Chief Complaint: right ulnar nerve subluxation HPI: 57 yo male with pain and subluxation of right ulnar nerve.  Ultrasound evaluation confirms subluxation of nerve.  He wishes to have ulnar nerve transposition.  Allergies:  Allergies  Allergen Reactions  . Morphine Other (See Comments)    Resp distress, rash, tachycardia  . Doxycycline Nausea And Vomiting  . Celebrex [Celecoxib] Other (See Comments)    Elevated LFT's  . Wellbutrin [Bupropion] Hives    Past Medical History:  Diagnosis Date  . DEPRESSION   . GERD (gastroesophageal reflux disease)   . HIV DISEASE   . IBS (irritable bowel syndrome)   . Sarcoidosis 2000   question of  . Ulnar nerve compression, right     Past Surgical History:  Procedure Laterality Date  . LUNG SURGERY  2000   vats  . TONSILLECTOMY AND ADENOIDECTOMY      Family History: Family History  Problem Relation Age of Onset  . Hyperlipidemia Mother   . Hypertension Mother   . Cancer Mother        glioblastoma  . Sudden death Neg Hx   . Heart attack Neg Hx   . Diabetes Neg Hx   . Colon cancer Neg Hx   . Prostate cancer Neg Hx     Social History:   reports that he quit smoking about 34 years ago. His smoking use included cigarettes. He has a 2.00 pack-year smoking history. He has never used smokeless tobacco. He reports that he drinks about 0.6 oz of alcohol per week. He reports that he does not use drugs.  Medications: Medications Prior to Admission  Medication Sig Dispense Refill  . citalopram (CELEXA) 20 MG tablet Take 1 tablet (20 mg total) by mouth daily. 90 tablet 3  . elvitegravir-cobicistat-emtricitabine-tenofovir (GENVOYA) 150-150-200-10 MG TABS tablet Take 1 tablet by mouth daily with breakfast. 90 tablet 3  . methocarbamol (ROBAXIN) 500 MG tablet Take 500 mg by mouth as needed.    . ranitidine (ZANTAC) 150 MG tablet Take 150 mg by mouth daily.    . sildenafil (VIAGRA) 100 MG tablet TAKE 1/2  TO 1 TABLET BY MOUTH DAILY AS NEEDED FOR ERECTILE DYSFUNCTION 10 tablet 6    No results found for this or any previous visit (from the past 74 hour(s)).  No results found.   A comprehensive review of systems was negative.  Blood pressure 135/77, pulse 67, temperature 97.7 F (36.5 C), temperature source Oral, resp. rate 18, height 5\' 8"  (1.727 m), weight 85.9 kg (189 lb 6.4 oz), SpO2 100 %.  General appearance: alert, cooperative and appears stated age Head: Normocephalic, without obvious abnormality, atraumatic Neck: supple, symmetrical, trachea midline Cardio: regular rate and rhythm Resp: clear to auscultation bilaterally Extremities: Intact sensation and capillary refill all digits.  +epl/fpl/io.  No wounds.  Pulses: 2+ and symmetric Skin: Skin color, texture, turgor normal. No rashes or lesions Neurologic: Grossly normal Incision/Wound: none  Assessment/Plan Right ulnar neuropathy with subluxation.  Non operative and operative treatment options were discussed with the patient and patient wishes to proceed with operative treatment. Risks, benefits, and alternatives of surgery were discussed and the patient agrees with the plan of care.   Kirsta Probert R 05/17/2018, 2:04 PM

## 2018-05-17 NOTE — Progress Notes (Signed)
Assisted Dr. Miller with right, ultrasound guided, supraclavicular block. Side rails up, monitors on throughout procedure. See vital signs in flow sheet. Tolerated Procedure well. 

## 2018-05-17 NOTE — Anesthesia Postprocedure Evaluation (Signed)
Anesthesia Post Note  Patient: Bruce Woods  Procedure(s) Performed: RIGHT ULNAR NERVE DECOMPRESSION/TRANSPOSITION (Right Elbow)     Patient location during evaluation: PACU Anesthesia Type: General Level of consciousness: awake and alert Pain management: pain level controlled Vital Signs Assessment: post-procedure vital signs reviewed and stable Respiratory status: spontaneous breathing, nonlabored ventilation and respiratory function stable Cardiovascular status: blood pressure returned to baseline and stable Postop Assessment: no apparent nausea or vomiting Anesthetic complications: no    Last Vitals:  Vitals:   05/17/18 1615 05/17/18 1630  BP: 123/81 130/89  Pulse: 64 68  Resp: 19 11  Temp:    SpO2: 95% 95%    Last Pain:  Vitals:   05/17/18 1630  TempSrc:   PainSc: [place]

## 2018-05-17 NOTE — Op Note (Signed)
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   ASSISTANT: none Daryll Brod, MD   ANESTHESIA:  General with regional   INTRAVENOUS FLUIDS:  Per anesthesia flow sheet.   ESTIMATED BLOOD LOSS:  Minimal.   COMPLICATIONS:  None.   SPECIMENS:  none   TOURNIQUET TIME:    Total Tourniquet Time Documented: Upper Arm (Right) - 56 minutes Total: Upper Arm (Right) - 56 minutes    DISPOSITION:  Stable to PACU.   INDICATIONS: 57 year old male with pain in the right ulnar nerve and tingling in the ring and small fingers.  He has subluxation of the ulnar nerve at the medial epicondyle he wishes to have an ulnar nerve transposition for management of his symptoms. Risks, benefits and alternatives of surgery were discussed including the risks of blood loss, infection, damage to nerves, vessels, tendons, ligaments, bone for surgery, need for additional surgery, complications with wound healing, continued pain, nonunion, malunion, stiffness.  He voiced understanding of these risks and elected to proceed.  OPERATIVE COURSE:  After being identified preoperatively by myself,  the patient and I agreed on the procedure and site of the procedure.  The surgical site was marked.  Surgical consent had been signed. He was given IV Ancef as preoperative antibiotic prophylaxis. He was transferred to the operating room and placed on the operating table in supine position with the Right upper extremity on an arm board.  General anesthesia was induced by the  anesthesiologist. A regional block had been performed by anesthesia in preoperative holding.   Right upper extremity was prepped and draped in normal sterile orthopedic fashion.  A surgical pause was performed between the surgeons, anesthesia, and operating room staff and all were in agreement as to the patient, procedure, and site of procedure.  Tourniquet at the proximal aspect of the extremity was inflated to 250 mmHg after exsanguination of the arm with an Esmarch bandage.    Incision was made at the medial side of the elbow and carried in subtenons tissues by spreading technique.  Bipolar electrocautery was used to obtain hemostasis.  The ulnar nerve was identified and carefully freed up of fascial covering including the investing fascia.  The medial intermuscular septum was then identified and carefully isolated.  It was released proximally and then from the humerus leaving its distal attachment intact.  A slip of the FCU fascia was then created that was proximally based.  These 2 slips of tissue were able to be used is a sling for the ulnar nerve.  The ulnar nerve was mobilized including its blood supply.  Bipolar electrocautery was used to obtain meticulous hemostasis.  The ulnar nerve was transposed anterior to the medial epicondyle.  The 2 slips of tissue were used as slings and secured to the anterior subtenons tissues with 2-0 Vicryl suture.  This prevented the nerve from subluxing back into the groove.  There was no compression over the nerve.  The wound was copiously irrigated with sterile saline.  A cutaneous branch of nerve had been identified  at the start of the case and protected throughout the case.  Subtenons tissues were closed with inverted interrupted 4-0 Vicryl sutures and the skin was closed with 4-0 nylon in a horizontal mattress fashion.  Wound was dressed with sterile Xeroform 4 x 4's and ABD and wrapped with a Kerlix bandage lightly.  A posterior splint was placed and wrapped with  Kerlix and Ace bandage.  The tourniquet was deflated at the 56 minutes.  Fingertips were pink with brisk capillary refill after deflation of tourniquet.  The operative  drapes were broken down.  The patient was awoken from anesthesia safely.  He was transferred back to the stretcher and taken to PACU in stable condition.  I will see him back in the office in 1 week for postoperative followup.  I will give him a prescription for Norco 5/325 1-2 tabs PO q6 hours prn pain, dispense # 20.   Tennis Must, MD Electronically signed, 05/17/18

## 2018-05-17 NOTE — Discharge Instructions (Addendum)

## 2018-05-17 NOTE — Anesthesia Procedure Notes (Signed)
Procedure Name: LMA Insertion Date/Time: 05/17/2018 2:18 PM Performed by: Eulas Post, Wrenna Saks W, CRNA Pre-anesthesia Checklist: Patient identified, Emergency Drugs available, Suction available and Patient being monitored Patient Re-evaluated:Patient Re-evaluated prior to induction Oxygen Delivery Method: Circle system utilized Preoxygenation: Pre-oxygenation with 100% oxygen Induction Type: IV induction Ventilation: Mask ventilation without difficulty LMA: LMA inserted LMA Size: 4.0 Number of attempts: 1 Placement Confirmation: positive ETCO2 and breath sounds checked- equal and bilateral Tube secured with: Tape Dental Injury: Teeth and Oropharynx as per pre-operative assessment

## 2018-05-17 NOTE — Op Note (Signed)
I assisted Surgeon(s) and Role:    * Leanora Cover, MD - Primary    Daryll Brod, MD - Assisting on the Procedure(s): RIGHT ULNAR NERVE DECOMPRESSION/TRANSPOSITION on 05/17/2018.  I provided assistance on this case as follows: setup, approach, identification of the nerve creation of the slings, trans[position of the nerve, closure of the wound and application of the dressings and splint.  Electronically signed by: Wynonia Sours, MD Date: 05/17/2018 Time: 3:35 PM

## 2018-05-17 NOTE — Transfer of Care (Addendum)
Immediate Anesthesia Transfer of Care Note  Patient: Bruce Woods  Procedure(s) Performed: RIGHT ULNAR NERVE DECOMPRESSION/TRANSPOSITION (Right Elbow)  Patient Location: PACU  Anesthesia Type:General and Regional  Level of Consciousness: awake and sedated  Airway & Oxygen Therapy: Patient Spontanous Breathing and Patient connected to face mask oxygen  Post-op Assessment: Report given to RN and Post -op Vital signs reviewed and stable  Post vital signs: Reviewed and stable  Last Vitals:  Vitals Value Taken Time  BP 107/71 05/17/2018  3:36 PM  Temp    Pulse 66 05/17/2018  3:37 PM  Resp 14 05/17/2018  3:37 PM  SpO2 97 % 05/17/2018  3:37 PM  Vitals shown include unvalidated device data.  Last Pain:  Vitals:   05/17/18 1110  TempSrc: Oral  PainSc: 5       Patients Stated Pain Goal: 0 (68/08/81 1031)  Complications: No apparent anesthesia complications

## 2018-05-17 NOTE — Anesthesia Procedure Notes (Signed)
Anesthesia Regional Block: Supraclavicular block   Pre-Anesthetic Checklist: ,, timeout performed, Correct Patient, Correct Site, Correct Laterality, Correct Procedure, Correct Position, site marked, Risks and benefits discussed,  Surgical consent,  Pre-op evaluation,  At surgeon's request and post-op pain management  Laterality: Right  Prep: chloraprep       Needles:  Injection technique: Single-shot  Needle Type: Stimiplex     Needle Length: 9cm  Needle Gauge: 21     Additional Needles:   Procedures:,,,, ultrasound used (permanent image in chart),,,,  Narrative:  Start time: 05/17/2018 11:47 AM End time: 05/17/2018 11:52 AM Injection made incrementally with aspirations every 5 mL.  Performed by: Personally  Anesthesiologist: Lynda Rainwater, MD

## 2018-05-17 NOTE — Anesthesia Preprocedure Evaluation (Signed)
Anesthesia Evaluation  Patient identified by MRN, date of birth, ID band Patient awake    Reviewed: Allergy & Precautions, NPO status , Patient's Chart, lab work & pertinent test results  Airway Mallampati: II  TM Distance: >3 FB Neck ROM: Full    Dental no notable dental hx.    Pulmonary neg pulmonary ROS, former smoker,    Pulmonary exam normal breath sounds clear to auscultation       Cardiovascular negative cardio ROS Normal cardiovascular exam Rhythm:Regular Rate:Normal     Neuro/Psych Depression negative neurological ROS  negative psych ROS   GI/Hepatic negative GI ROS, Neg liver ROS, GERD  ,  Endo/Other  negative endocrine ROS  Renal/GU negative Renal ROS  negative genitourinary   Musculoskeletal negative musculoskeletal ROS (+)   Abdominal   Peds negative pediatric ROS (+)  Hematology negative hematology ROS (+) HIV,   Anesthesia Other Findings   Reproductive/Obstetrics negative OB ROS                             Anesthesia Physical Anesthesia Plan  ASA: III  Anesthesia Plan: General   Post-op Pain Management:  Regional for Post-op pain   Induction: Intravenous  PONV Risk Score and Plan: 2 and Ondansetron and Midazolam  Airway Management Planned: LMA  Additional Equipment:   Intra-op Plan:   Post-operative Plan: Extubation in OR  Informed Consent: I have reviewed the patients History and Physical, chart, labs and discussed the procedure including the risks, benefits and alternatives for the proposed anesthesia with the patient or authorized representative who has indicated his/her understanding and acceptance.   Dental advisory given  Plan Discussed with: CRNA  Anesthesia Plan Comments:         Anesthesia Quick Evaluation

## 2018-05-18 ENCOUNTER — Encounter (HOSPITAL_BASED_OUTPATIENT_CLINIC_OR_DEPARTMENT_OTHER): Payer: Self-pay | Admitting: Orthopedic Surgery

## 2018-05-18 MED FILL — HYDROCODON-APAP 5-325: 5-325 | 3 days supply | Qty: 20 | Fill #0

## 2018-05-24 MED FILL — GENVOYA TABLET: 150-150-200 | 30 days supply | Qty: 30 | Fill #7

## 2018-05-25 DIAGNOSIS — M25521 Pain in right elbow: Secondary | ICD-10-CM | POA: Diagnosis not present

## 2018-05-25 DIAGNOSIS — G5621 Lesion of ulnar nerve, right upper limb: Secondary | ICD-10-CM | POA: Diagnosis not present

## 2018-06-06 NOTE — Addendum Note (Signed)
Addendum  created 06/06/18 0747 by Lieutenant Diego, CRNA   Intraprocedure Meds edited

## 2018-06-07 DIAGNOSIS — G5621 Lesion of ulnar nerve, right upper limb: Secondary | ICD-10-CM | POA: Diagnosis not present

## 2018-06-07 DIAGNOSIS — M25521 Pain in right elbow: Secondary | ICD-10-CM | POA: Diagnosis not present

## 2018-06-07 DIAGNOSIS — M25621 Stiffness of right elbow, not elsewhere classified: Secondary | ICD-10-CM | POA: Diagnosis not present

## 2018-06-15 DIAGNOSIS — G5621 Lesion of ulnar nerve, right upper limb: Secondary | ICD-10-CM | POA: Diagnosis not present

## 2018-06-15 DIAGNOSIS — M25621 Stiffness of right elbow, not elsewhere classified: Secondary | ICD-10-CM | POA: Diagnosis not present

## 2018-06-15 MED FILL — METHOCARBAMOL 500 MG TABLET: 500 | 10 days supply | Qty: 30 | Fill #0

## 2018-06-28 MED FILL — GENVOYA TABLET: 150-150-200 | 30 days supply | Qty: 30 | Fill #8

## 2018-07-02 IMAGING — CR DG CHEST 2V
2 series · 2 of 2 positions shown · non-contrast
Comparison: 08/18/2010 chest radiograph.

CLINICAL DATA: Chest pain

EXAM:
CHEST  2 VIEW

[w chest pa]
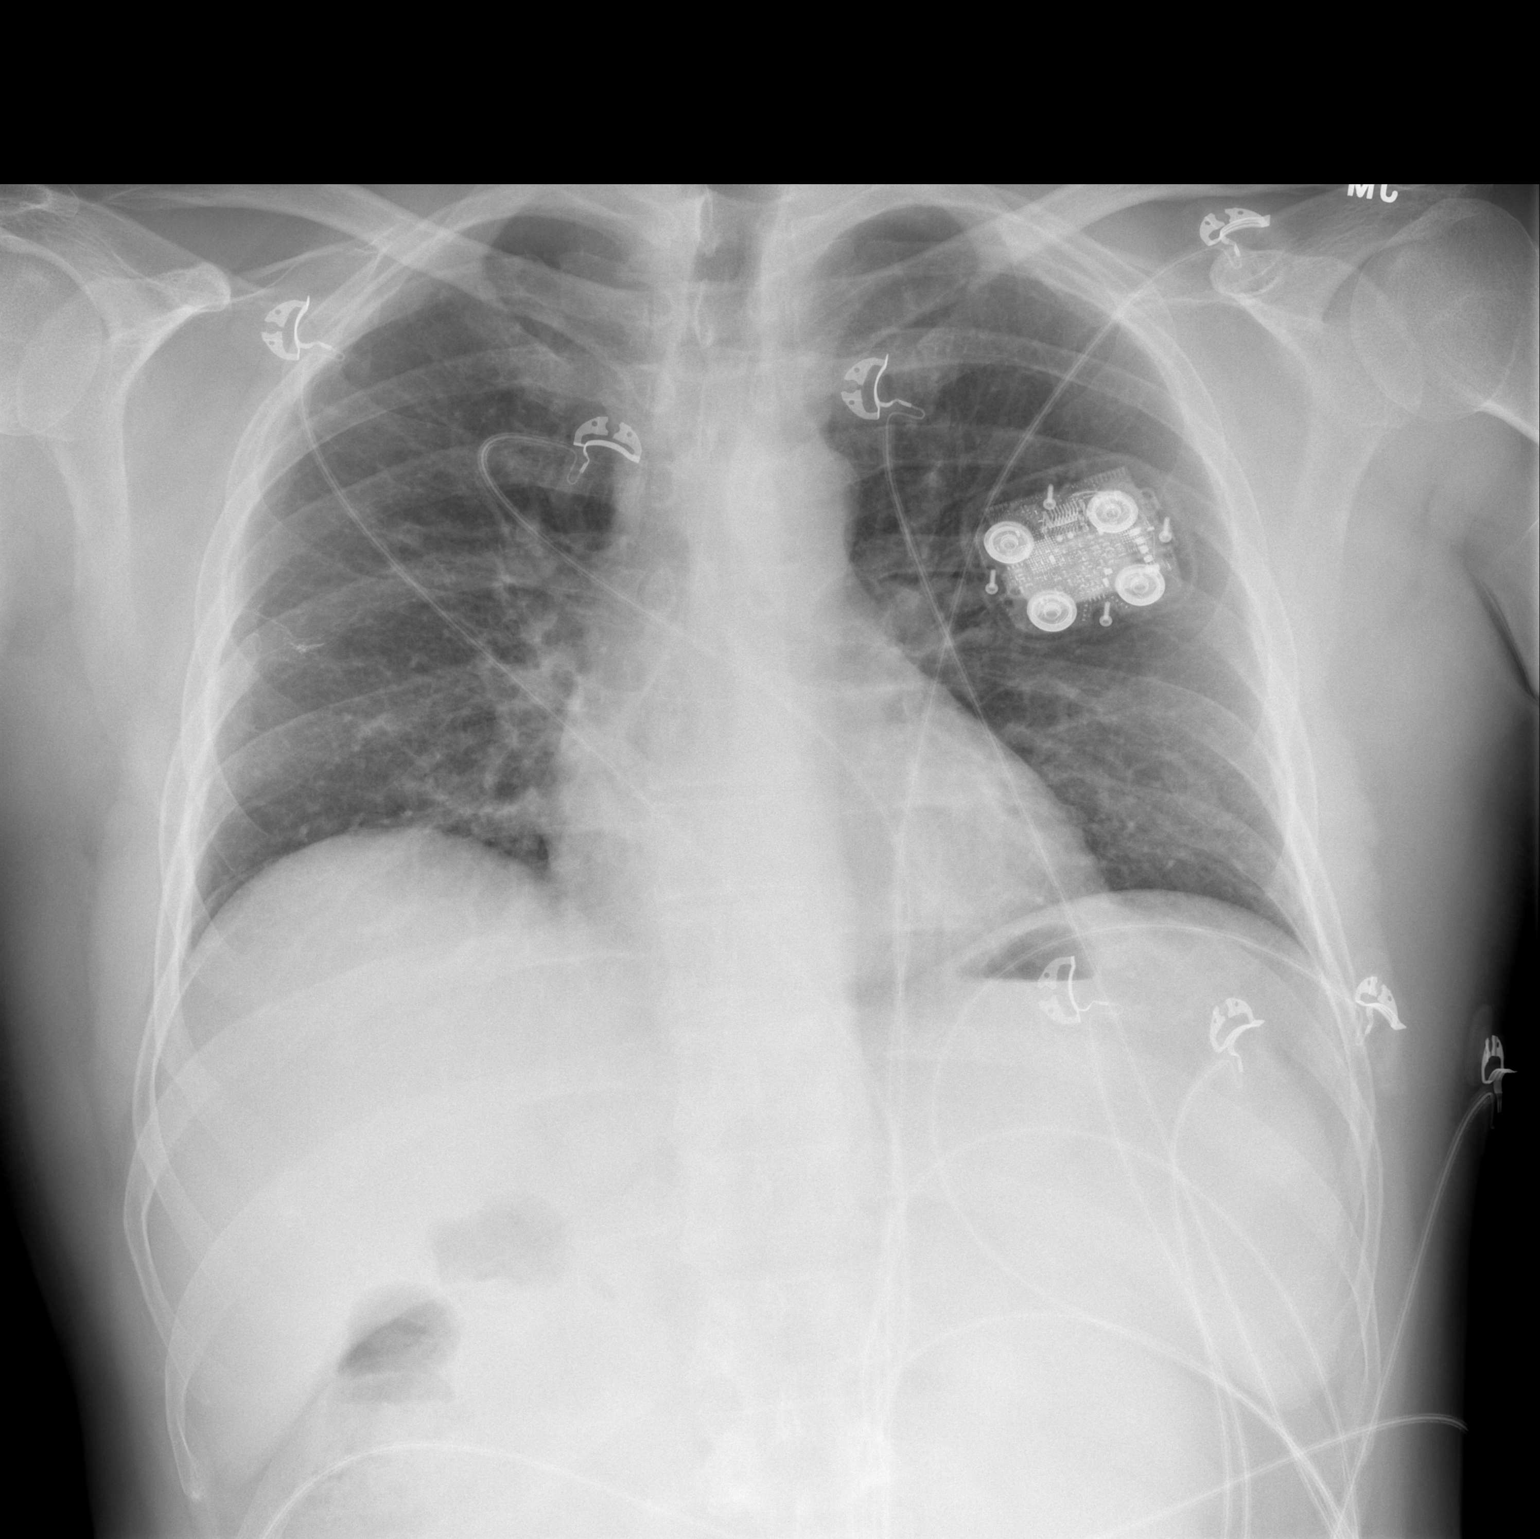

[w chest lat *]
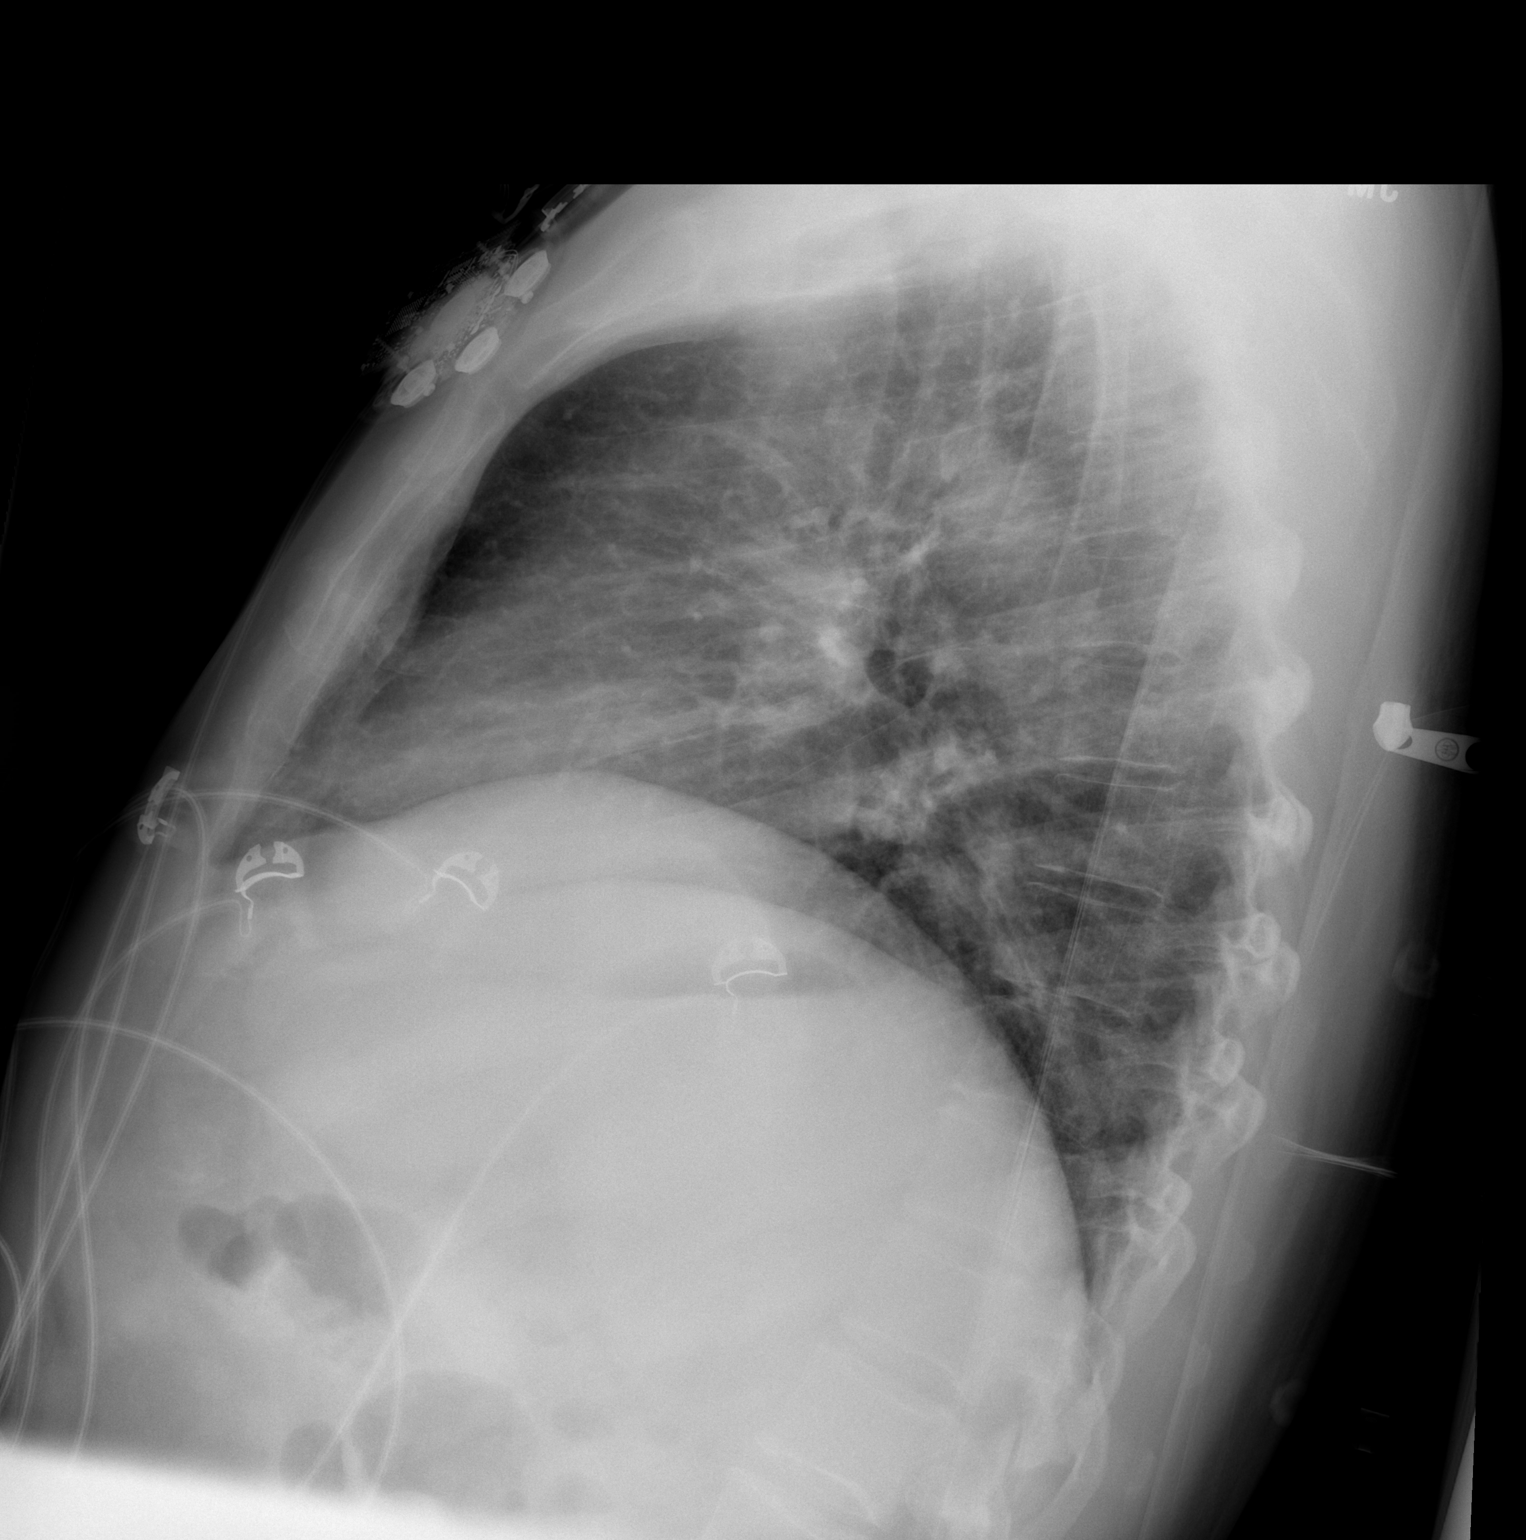

[2 of 2 positions shown; findings below may reference images not displayed]

FINDINGS: Cardiac monitor overlies the left chest. Stable cardiomediastinal
silhouette with normal heart size. No pneumothorax. No pleural
effusion. Stable surgical suture line in the peripheral right mid
lung. No pulmonary edema. No acute consolidative airspace disease.
IMPRESSION: No active cardiopulmonary disease.

## 2018-07-26 MED FILL — GENVOYA TABLET: 150-150-200 | 30 days supply | Qty: 30 | Fill #9

## 2018-07-26 MED FILL — CITALOPRAM HBR 20 MG TABLET: 20 | 90 days supply | Qty: 90 | Fill #3

## 2018-08-27 MED FILL — GENVOYA TABLET: 150-150-200 | 30 days supply | Qty: 30 | Fill #10

## 2018-09-24 MED FILL — GENVOYA TABLET: 150-150-200 | 30 days supply | Qty: 30 | Fill #11

## 2018-09-25 ENCOUNTER — Emergency Department (INDEPENDENT_AMBULATORY_CARE_PROVIDER_SITE_OTHER): Payer: 59

## 2018-09-25 ENCOUNTER — Other Ambulatory Visit: Payer: Self-pay

## 2018-09-25 ENCOUNTER — Emergency Department
Admission: EM | Admit: 2018-09-25 | Discharge: 2018-09-25 | Disposition: A | Discharge status: Home / Self Care | Payer: 59 | Source: Home / Self Care | Attending: Family Medicine | Admitting: Family Medicine

## 2018-09-25 DIAGNOSIS — J040 Acute laryngitis: Secondary | ICD-10-CM | POA: Diagnosis not present

## 2018-09-25 DIAGNOSIS — R05 Cough: Secondary | ICD-10-CM | POA: Diagnosis not present

## 2018-09-25 DIAGNOSIS — B9789 Other viral agents as the cause of diseases classified elsewhere: Secondary | ICD-10-CM

## 2018-09-25 DIAGNOSIS — R0989 Other specified symptoms and signs involving the circulatory and respiratory systems: Secondary | ICD-10-CM

## 2018-09-25 DIAGNOSIS — J069 Acute upper respiratory infection, unspecified: Secondary | ICD-10-CM

## 2018-09-25 MED ORDER — PREDNISONE 20 MG PO TABS: ORAL_TABLET | ORAL | 0 refills | Status: DC

## 2018-09-25 MED ORDER — BENZONATATE 100 MG PO CAPS: 100.0000 mg | ORAL_CAPSULE | Freq: Three times a day (TID) | ORAL | 0 refills | Status: DC

## 2018-09-25 MED FILL — predniSONE 20 MG TABS: 20 | 5 days supply | Qty: 11 | Fill #0

## 2018-09-25 MED FILL — BENZONATATE 100 MG CAPS: 100 | 3 days supply | Qty: 21 | Fill #0

## 2018-09-25 NOTE — Discharge Instructions (Signed)
°  Please follow up with family medicine in 1 week if not improving.

## 2018-09-25 NOTE — ED Triage Notes (Signed)
Started Thursday with cough and drainage.  Friday loss of voice, headaches, coughing.  Sat/ sun fatigue, loss of appetite.  Coughed all night last night.

## 2018-09-25 NOTE — ED Provider Notes (Signed)
[facility]    CSN: [id number] Arrival date & time: 09/25/18  0915     History   Chief Complaint Chief Complaint  Patient presents with  . Cough  . Hoarse    HPI Bruce Woods is a 57 y.o. male.   HPI Bruce Woods is a 57 y.o. male presenting to UC with c/o mildly productive cough with post-nasal drainage for about 1 week, associated generalized HA, loss of voice, loss of appetite, and fatigue over the weekend.  His voice is still hoarse. Cough kept him up all last night. He did travel to [place] last week.  Denies fever, chills, n/v/d. He has tried multiple OTC cough/cold medications w/o relief.     Past Medical History:  Diagnosis Date  . DEPRESSION   . GERD (gastroesophageal reflux disease)   . HIV DISEASE   . IBS (irritable bowel syndrome)   . Sarcoidosis 2000   question of  . Ulnar nerve compression, right     Patient Active Problem List   Diagnosis Date Noted  . Right elbow pain 04/04/2018  . Dyspnea on exertion 08/01/2017  . PCP NOTES >>>>>>>>>>>>>>>>>>>>>>>>> 01/01/2016  . Insomnia 11/19/2013  . Annual physical exam 10/01/2013  . Fatigue, low vit D 08/19/2011  . Erectile dysfunction 08/19/2011  . Depression 01/03/2007  . Human immunodeficiency virus (HIV) disease ([place]) 08/18/2006  . SARCOIDOSIS 08/18/2006    Past Surgical History:  Procedure Laterality Date  . LUNG SURGERY  2000   vats  . TONSILLECTOMY AND ADENOIDECTOMY    . ULNAR NERVE TRANSPOSITION Right 05/17/2018   Procedure: RIGHT ULNAR NERVE DECOMPRESSION/TRANSPOSITION;  Surgeon: Leanora Cover, MD;  Location: [facility];  Service: Orthopedics;  Laterality: Right;       Home Medications    Prior to Admission medications   Medication Sig Start Date End Date Taking? Authorizing Provider  famotidine (PEPCID) 20 MG tablet Take 20 mg by mouth 2 (two) times daily.   Yes [provider]  benzonatate (TESSALON) 100 MG capsule Take 1-2 capsules  (100-200 mg total) by mouth every 8 (eight) hours. 09/25/18   Noe Gens, PA-C  citalopram (CELEXA) 20 MG tablet Take 1 tablet (20 mg total) by mouth daily. 10/16/17   Michel Bickers, MD  elvitegravir-cobicistat-emtricitabine-tenofovir (GENVOYA) 150-150-200-10 MG TABS tablet Take 1 tablet by mouth daily with breakfast. 10/16/17   Michel Bickers, MD  HYDROcodone-acetaminophen [facility]) 5-325 MG tablet 1-2 tabs po q6 hours prn pain 05/17/18   Leanora Cover, MD  methocarbamol (ROBAXIN) 500 MG tablet Take 500 mg by mouth as needed.    [provider]  predniSONE (DELTASONE) 20 MG tablet 3 tabs po day one, then 2 po daily x 4 days 09/25/18   Noe Gens, PA-C  ranitidine (ZANTAC) 150 MG tablet Take 150 mg by mouth daily.    [provider]  sildenafil (VIAGRA) 100 MG tablet TAKE 1/2 TO 1 TABLET BY MOUTH DAILY AS NEEDED FOR ERECTILE DYSFUNCTION 03/08/18   Colon Branch, MD    Family History Family History  Problem Relation Age of Onset  . Hyperlipidemia Mother   . Hypertension Mother   . Cancer Mother        glioblastoma  . Sudden death Neg Hx   . Heart attack Neg Hx   . Diabetes Neg Hx   . Colon cancer Neg Hx   . Prostate cancer Neg Hx     Social History Social History   Tobacco Use  .  Smoking status: Former Smoker    Packs/day: 1.00    Years: 2.00    Pack years: 2.00    Types: Cigarettes    Last attempt to quit: 11/01/1983    Years since quitting: 34.9  . Smokeless tobacco: Never Used  Substance Use Topics  . Alcohol use: Yes    Alcohol/week: 1.0 standard drinks    Types: 1 Standard drinks or equivalent per week    Comment: socially   . Drug use: No     Allergies   Morphine; Doxycycline; Celebrex [celecoxib]; and Wellbutrin [bupropion]   Review of Systems Review of Systems  Constitutional: Positive for fatigue. Negative for chills and fever.  HENT: Positive for congestion, postnasal drip, sore throat and voice change. Negative for ear pain and trouble  swallowing.   Respiratory: Negative for cough and shortness of breath.   Cardiovascular: Negative for chest pain and palpitations.  Gastrointestinal: Negative for abdominal pain, diarrhea, nausea and vomiting.  Musculoskeletal: Negative for arthralgias, back pain and myalgias.  Skin: Negative for rash.  Neurological: Positive for headaches. Negative for dizziness and light-headedness.     Physical Exam Triage Vital Signs ED Triage Vitals  Enc Vitals Group     BP 09/25/18 0933 (!) 138/97     Pulse Rate 09/25/18 0933 65     Resp 09/25/18 0933 18     Temp 09/25/18 0933 98.3 F (36.8 C)     Temp Source 09/25/18 0933 Oral     SpO2 09/25/18 0933 96 %     Weight 09/25/18 0934 194 lb (88 kg)     Height 09/25/18 0934 5\' 8"  (1.727 m)     Head Circumference --      Peak Flow --      Pain Score 09/25/18 0934 0     Pain Loc --      Pain Edu? --      Excl. in [place]? --    No data found.  Updated Vital Signs BP (!) 138/97 (BP Location: Right Arm)   Pulse 65   Temp 98.3 F (36.8 C) (Oral)   Resp 18   Ht 5\' 8"  (1.727 m)   Wt 194 lb (88 kg)   SpO2 96%   BMI 29.50 kg/m   Visual Acuity Right Eye Distance:   Left Eye Distance:   Bilateral Distance:    Right Eye Near:   Left Eye Near:    Bilateral Near:     Physical Exam  Constitutional: He is oriented to person, place, and time. He appears well-developed and well-nourished.  HENT:  Head: Normocephalic and atraumatic.  Right Ear: Tympanic membrane normal.  Left Ear: Tympanic membrane normal.  Nose: Nose normal.  Mouth/Throat: Uvula is midline, oropharynx is clear and moist and mucous membranes are normal.  Eyes: EOM are normal.  Neck: Normal range of motion. Neck supple.  Cardiovascular: Normal rate and regular rhythm.  Pulmonary/Chest: Effort normal and breath sounds normal. No stridor. No respiratory distress. He has no wheezes. He has no rales.  Hoarse voice w/o stridor  Musculoskeletal: Normal range of motion.    Neurological: He is alert and oriented to person, place, and time.  Skin: Skin is warm and dry.  Psychiatric: He has a normal mood and affect. His behavior is normal.  Nursing note and vitals reviewed.    UC Treatments / Results  Labs (all labs ordered are listed, but only abnormal results are displayed) Labs Reviewed - No data to display  EKG None  Radiology Dg Chest 2 View  Result Date: 09/25/2018 CLINICAL DATA:  Cough and congestion. EXAM: CHEST - 2 VIEW COMPARISON:  08/01/2017. FINDINGS: Mediastinum hilar structures normal. Postsurgical changes right lung again noted without interim change. Lungs are clear of acute infiltrates. No pleural effusion or pneumothorax. IMPRESSION: No acute abnormality. Electronically Signed   By: Marcello Moores  Register   On: 09/25/2018 09:56    Procedures Procedures (including critical care time)  Medications Ordered in UC Medications - No data to display  Initial Impression / Assessment and Plan / UC Course  I have reviewed the triage vital signs and the nursing notes.  Pertinent labs & imaging results that were available during my care of the patient were reviewed by me and considered in my medical decision making (see chart for details).     Hx and exam c/w viral illness Encouraged symptomatic treatment.   Final Clinical Impressions(s) / UC Diagnoses   Final diagnoses:  Laryngitis  Viral URI with cough     Discharge Instructions      Please follow up with family medicine in 1 week if not improving.    ED Prescriptions    Medication Sig Dispense Auth. Provider   predniSONE (DELTASONE) 20 MG tablet 3 tabs po day one, then 2 po daily x 4 days 11 tablet Janeisha Ryle O, PA-C   benzonatate (TESSALON) 100 MG capsule Take 1-2 capsules (100-200 mg total) by mouth every 8 (eight) hours. 21 capsule Noe Gens, PA-C     Controlled Substance Prescriptions [place] Controlled Substance Registry consulted? Not Applicable   Tyrell Antonio 09/25/18 1121

## 2018-10-05 ENCOUNTER — Other Ambulatory Visit: Payer: 59

## 2018-10-05 DIAGNOSIS — B20 Human immunodeficiency virus [HIV] disease: Secondary | ICD-10-CM | POA: Diagnosis not present

## 2018-10-05 LAB — T-HELPER CELL (CD4) - (RCID CLINIC ONLY)
CD4 T CELL HELPER: 22 % — AB (ref 33–55)
CD4 T Cell Abs: 390 /uL — ABNORMAL LOW (ref 400–2700)

## 2018-10-08 LAB — CBC WITH DIFFERENTIAL/PLATELET
BASOS ABS: 23 {cells}/uL (ref 0–200)
Basophils Relative: 0.3 %
EOS PCT: 0.5 %
Eosinophils Absolute: 38 cells/uL (ref 15–500)
HCT: 46.7 % (ref 38.5–50.0)
Hemoglobin: 16.5 g/dL (ref 13.2–17.1)
Lymphs Abs: 1838 cells/uL (ref 850–3900)
MCH: 32.4 pg (ref 27.0–33.0)
MCHC: 35.3 g/dL (ref 32.0–36.0)
MCV: 91.7 fL (ref 80.0–100.0)
MPV: 11 fL (ref 7.5–12.5)
Monocytes Relative: 8 %
NEUTROS PCT: 66.7 %
Neutro Abs: 5003 cells/uL (ref 1500–7800)
Platelets: 196 10*3/uL (ref 140–400)
RBC: 5.09 10*6/uL (ref 4.20–5.80)
RDW: 12.8 % (ref 11.0–15.0)
TOTAL LYMPHOCYTE: 24.5 %
WBC mixed population: 600 cells/uL (ref 200–950)
WBC: 7.5 10*3/uL (ref 3.8–10.8)

## 2018-10-08 LAB — COMPLETE METABOLIC PANEL WITH GFR
AG RATIO: 1.6 (calc) (ref 1.0–2.5)
ALT: 15 U/L (ref 9–46)
AST: 15 U/L (ref 10–35)
Albumin: 4.4 g/dL (ref 3.6–5.1)
Alkaline phosphatase (APISO): 62 U/L (ref 40–115)
BILIRUBIN TOTAL: 0.8 mg/dL (ref 0.2–1.2)
BUN: 17 mg/dL (ref 7–25)
CALCIUM: 9.3 mg/dL (ref 8.6–10.3)
CHLORIDE: 101 mmol/L (ref 98–110)
CO2: 30 mmol/L (ref 20–32)
Creat: 1.24 mg/dL (ref 0.70–1.33)
GFR, EST AFRICAN AMERICAN: 73 mL/min/{1.73_m2} (ref >=60)
GFR, Est Non African American: 63 mL/min/{1.73_m2} (ref >=60)
GLOBULIN: 2.7 g/dL (ref 1.9–3.7)
GLUCOSE: 101 mg/dL — AB (ref 65–99)
Potassium: 4 mmol/L (ref 3.5–5.3)
SODIUM: 139 mmol/L (ref 135–146)
Total Protein: 7.1 g/dL (ref 6.1–8.1)

## 2018-10-08 LAB — HIV-1 RNA QUANT-NO REFLEX-BLD
HIV 1 RNA Quant: 22 copies/mL — ABNORMAL HIGH
HIV-1 RNA Quant, Log: 1.34 Log copies/mL — ABNORMAL HIGH

## 2018-10-16 ENCOUNTER — Encounter: Payer: Self-pay | Admitting: Internal Medicine

## 2018-10-16 ENCOUNTER — Ambulatory Visit: Payer: 59 | Admitting: Internal Medicine

## 2018-10-16 DIAGNOSIS — B20 Human immunodeficiency virus [HIV] disease: Secondary | ICD-10-CM

## 2018-10-16 DIAGNOSIS — F3342 Major depressive disorder, recurrent, in full remission: Secondary | ICD-10-CM | POA: Diagnosis not present

## 2018-10-16 MED ORDER — ELVITEG-COBIC-EMTRICIT-TENOFAF 150-150-200-10 MG PO TABS: 1.0000 | ORAL_TABLET | Freq: Every day | ORAL | 3 refills | Status: DC

## 2018-10-16 MED ORDER — CITALOPRAM HYDROBROMIDE 20 MG PO TABS: 20.0000 mg | ORAL_TABLET | Freq: Every day | ORAL | 3 refills | Status: DC

## 2018-10-16 MED FILL — CITALOPRAM HBR 20 MG TABLET: 20 | 90 days supply | Qty: 90 | Fill #0

## 2018-10-16 MED FILL — GENVOYA TABLET: 150-150-200 | 30 days supply | Qty: 30 | Fill #0

## 2018-10-16 NOTE — Progress Notes (Signed)
Patient Active Problem List   Diagnosis Date Noted  . Depression 01/03/2007    Priority: High  . Human immunodeficiency virus (HIV) disease ([place]) 08/18/2006    Priority: High  . Right elbow pain 04/04/2018  . Dyspnea on exertion 08/01/2017  . PCP NOTES >>>>>>>>>>>>>>>>>>>>>>>>> 01/01/2016  . Insomnia 11/19/2013  . Annual physical exam 10/01/2013  . Fatigue, low vit D 08/19/2011  . Erectile dysfunction 08/19/2011  . SARCOIDOSIS 08/18/2006    Patient's Medications  New Prescriptions   No medications on file  Previous Medications   BENZONATATE (TESSALON) 100 MG CAPSULE    Take 1-2 capsules (100-200 mg total) by mouth every 8 (eight) hours.   FAMOTIDINE (PEPCID) 20 MG TABLET    Take 20 mg by mouth 2 (two) times daily.   HYDROCODONE-ACETAMINOPHEN (NORCO) 5-325 MG TABLET    1-2 tabs po q6 hours prn pain   METHOCARBAMOL (ROBAXIN) 500 MG TABLET    Take 500 mg by mouth as needed.   PREDNISONE (DELTASONE) 20 MG TABLET    3 tabs po day one, then 2 po daily x 4 days   RANITIDINE (ZANTAC) 150 MG TABLET    Take 150 mg by mouth daily.   SILDENAFIL (VIAGRA) 100 MG TABLET    TAKE 1/2 TO 1 TABLET BY MOUTH DAILY AS NEEDED FOR ERECTILE DYSFUNCTION  Modified Medications   Modified Medication Previous Medication   CITALOPRAM (CELEXA) 20 MG TABLET citalopram (CELEXA) 20 MG tablet      Take 1 tablet (20 mg total) by mouth daily.    Take 1 tablet (20 mg total) by mouth daily.   ELVITEGRAVIR-COBICISTAT-EMTRICITABINE-TENOFOVIR (GENVOYA) 150-150-200-10 MG TABS TABLET elvitegravir-cobicistat-emtricitabine-tenofovir (GENVOYA) 150-150-200-10 MG TABS tablet      Take 1 tablet by mouth daily with breakfast.    Take 1 tablet by mouth daily with breakfast.  Discontinued Medications   No medications on file    Subjective: Bruce Woods is in for his routine HIV follow-up visit.  He has had no problems obtaining, taking or tolerating his Genvoya.  He never misses a single dose.  Review of  Systems: Review of Systems  Constitutional: Negative for chills, diaphoresis and fever.  Gastrointestinal: Negative for abdominal pain, diarrhea, nausea and vomiting.    Past Medical History:  Diagnosis Date  . DEPRESSION   . GERD (gastroesophageal reflux disease)   . HIV DISEASE   . IBS (irritable bowel syndrome)   . Sarcoidosis 2000   question of  . Ulnar nerve compression, right     Social History   Tobacco Use  . Smoking status: Former Smoker    Packs/day: 1.00    Years: 2.00    Pack years: 2.00    Types: Cigarettes    Last attempt to quit: 11/01/1983    Years since quitting: 34.9  . Smokeless tobacco: Never Used  Substance Use Topics  . Alcohol use: Yes    Alcohol/week: 1.0 standard drinks    Types: 1 Standard drinks or equivalent per week    Comment: socially   . Drug use: No    Family History  Problem Relation Age of Onset  . Hyperlipidemia Mother   . Hypertension Mother   . Cancer Mother        glioblastoma  . Sudden death Neg Hx   . Heart attack Neg Hx   . Diabetes Neg Hx   . Colon cancer Neg Hx   . Prostate cancer Neg Hx  Allergies  Allergen Reactions  . Morphine Other (See Comments)    Resp distress, rash, tachycardia  . Doxycycline Nausea And Vomiting  . Celebrex [Celecoxib] Other (See Comments)    Elevated LFT's  . Wellbutrin [Bupropion] Hives    Health Maintenance  Topic Date Due  . INFLUENZA VACCINE  05/31/2018  . COLONOSCOPY  01/05/2019 (Originally 02/05/2009)  . TETANUS/TDAP  01/03/2027  . Hepatitis C Screening  Completed  . HIV Screening  Completed    Objective:  Vitals:   10/16/18 1523  BP: (!) 149/83  Pulse: 80  Temp: 98 F (36.7 C)  Weight: 194 lb (88 kg)  Height: 5\' 8"  (1.727 m)   Body mass index is 29.5 kg/m.  Physical Exam Constitutional:      Comments: He is talkative and in good spirits as usual.  Skin:    Findings: No rash.  Psychiatric:        Mood and Affect: Mood normal.     Lab Results Lab  Results  Component Value Date   WBC 7.5 10/05/2018   HGB 16.5 10/05/2018   HCT 46.7 10/05/2018   MCV 91.7 10/05/2018   PLT 196 10/05/2018    Lab Results  Component Value Date   CREATININE 1.24 10/05/2018   BUN 17 10/05/2018   NA 139 10/05/2018   K 4.0 10/05/2018   CL 101 10/05/2018   CO2 30 10/05/2018    Lab Results  Component Value Date   ALT 15 10/05/2018   AST 15 10/05/2018   ALKPHOS 50 04/19/2017   BILITOT 0.8 10/05/2018    Lab Results  Component Value Date   CHOL 195 04/10/2018   HDL 34 (L) 04/10/2018   LDLCALC 118 (H) 04/10/2018   LDLDIRECT 86.0 01/04/2018   TRIG 302 (H) 04/10/2018   CHOLHDL 5.7 (H) 04/10/2018   Lab Results  Component Value Date   LABRPR NON-REACTIVE 04/10/2018   HIV 1 RNA Quant (copies/mL)  Date Value  10/05/2018 22 (H)  04/10/2018 <20 DETECTED (A)  10/02/2017 47 (H)   CD4 T Cell Abs (/uL)  Date Value  10/05/2018 390 (L)  04/10/2018 500  10/02/2017 600     Problem List Items Addressed This Visit      High   Human immunodeficiency virus (HIV) disease ([place])    His infection is under excellent, long-term control.  He will continue Genvoya and get repeat lab work in 6 months.      Relevant Medications   elvitegravir-cobicistat-emtricitabine-tenofovir (GENVOYA) 150-150-200-10 MG TABS tablet   Other Relevant Orders   T-helper cell (CD4)- ([facility] clinic only)   HIV-1 RNA quant-no reflex-bld   CBC   T-helper cell (CD4)- ([facility] clinic only)   Comprehensive metabolic panel   Lipid panel   RPR   HIV-1 RNA quant-no reflex-bld   Depression   Relevant Medications   elvitegravir-cobicistat-emtricitabine-tenofovir (GENVOYA) 150-150-200-10 MG TABS tablet   citalopram (CELEXA) 20 MG tablet        Bruce Bickers, MD [facility] [place] [facility] [phone] pager   [phone] cell 10/16/2018, 4:24 PM

## 2018-10-16 NOTE — Assessment & Plan Note (Signed)
His infection is under excellent, long-term control.  He will continue Genvoya and get repeat lab work in 6 months.

## 2018-10-29 MED FILL — SILDENAFIL CITRATE 100 MG T: 100 | 30 days supply | Qty: 6 | Fill #1

## 2018-11-01 DIAGNOSIS — F4322 Adjustment disorder with anxiety: Secondary | ICD-10-CM | POA: Diagnosis not present

## 2018-11-06 ENCOUNTER — Telehealth: Payer: Self-pay | Admitting: Behavioral Health

## 2018-11-06 NOTE — Telephone Encounter (Signed)
Patient came in today and dropped of immunization records that he would like Dr. Megan Salon to review.  Cover page states patient has received 2 series of Hepatitis B vaccine but still Hep B antibodies are still undetectable. Patient is concerned.  Copy of paperwork placed in Dr. Hale Bogus Box, and a copy in Triage as well.  Pricilla Riffle RN

## 2018-11-14 DIAGNOSIS — F4322 Adjustment disorder with anxiety: Secondary | ICD-10-CM | POA: Diagnosis not present

## 2018-11-15 ENCOUNTER — Other Ambulatory Visit: Payer: Self-pay | Admitting: Internal Medicine

## 2018-11-15 DIAGNOSIS — B20 Human immunodeficiency virus [HIV] disease: Secondary | ICD-10-CM

## 2018-11-15 NOTE — Telephone Encounter (Signed)
Please let Bruce Woods know that some people have a genetic variant that makes them nonresponders to hepatitis B vaccine.  However, I would like him to come in at his convenience and have further testing to make sure that his nonresponse is not due to prior hepatitis B infection.  I have put in an order for hepatitis B surface antigen testing.

## 2018-11-20 NOTE — Telephone Encounter (Signed)
Called Bruce Woods, informed him per Dr. Megan Salon that some people have a genetic variant that makes them nonresponders to the Hep B vaccine.  Informed him Dr. Tommy Medal wanted to do further testing to make sure the non response is not due to prior Hepatitis B infection.  Scheduled him a lab visit to come in to have Hepatitis B surface antigen testing.  Patient verbalized understanding and was appreciative of the call. Pricilla Riffle RN

## 2018-11-21 ENCOUNTER — Other Ambulatory Visit: Payer: 59

## 2018-11-21 DIAGNOSIS — N486 Induration penis plastica: Secondary | ICD-10-CM | POA: Diagnosis not present

## 2018-11-21 DIAGNOSIS — B20 Human immunodeficiency virus [HIV] disease: Secondary | ICD-10-CM

## 2018-11-21 MED FILL — MELOXICAM 15 MG TABLET: 15 | 30 days supply | Qty: 30 | Fill #0

## 2018-11-22 LAB — HEPATITIS B SURFACE ANTIGEN: Hepatitis B Surface Ag: NONREACTIVE

## 2018-11-22 MED FILL — GENVOYA TABLET: 150-150-200 | 30 days supply | Qty: 30 | Fill #1

## 2018-12-12 DIAGNOSIS — H524 Presbyopia: Secondary | ICD-10-CM | POA: Diagnosis not present

## 2018-12-12 DIAGNOSIS — H52223 Regular astigmatism, bilateral: Secondary | ICD-10-CM | POA: Diagnosis not present

## 2018-12-24 MED FILL — GENVOYA TABLET: 150-150-200 | 30 days supply | Qty: 30 | Fill #2

## 2019-01-07 ENCOUNTER — Ambulatory Visit (INDEPENDENT_AMBULATORY_CARE_PROVIDER_SITE_OTHER): Payer: 59 | Admitting: Internal Medicine

## 2019-01-07 ENCOUNTER — Encounter: Payer: Self-pay | Admitting: Internal Medicine

## 2019-01-07 VITALS — BP 120/74 | HR 58 | Temp 98.0°F | Resp 16 | Ht 68.0 in | Wt 194.1 lb

## 2019-01-07 DIAGNOSIS — Z0000 Encounter for general adult medical examination without abnormal findings: Secondary | ICD-10-CM | POA: Diagnosis not present

## 2019-01-07 DIAGNOSIS — E559 Vitamin D deficiency, unspecified: Secondary | ICD-10-CM | POA: Diagnosis not present

## 2019-01-07 LAB — LIPID PANEL
Cholesterol: 174 mg/dL (ref 0–200)
HDL: 33.4 mg/dL — ABNORMAL LOW (ref >39.00)
NonHDL: 140.33
Total CHOL/HDL Ratio: 5
Triglycerides: 208 mg/dL — ABNORMAL HIGH (ref 0.0–149.0)
VLDL: 41.6 mg/dL — ABNORMAL HIGH (ref 0.0–40.0)

## 2019-01-07 LAB — VITAMIN D 25 HYDROXY (VIT D DEFICIENCY, FRACTURES): VITD: 21.67 ng/mL — ABNORMAL LOW (ref 30.00–100.00)

## 2019-01-07 LAB — LDL CHOLESTEROL, DIRECT: Direct LDL: 115 mg/dL

## 2019-01-07 LAB — HEMOGLOBIN A1C: Hgb A1c MFr Bld: 4.9 % (ref 4.6–6.5)

## 2019-01-07 NOTE — Patient Instructions (Signed)
GO TO THE LAB : Get the blood work     GO TO THE FRONT DESK Schedule your next appointment   For a physical exam in 1 year  

## 2019-01-07 NOTE — Progress Notes (Signed)
Subjective:    Patient ID: Bruce Woods, male    DOB: 1960-11-21, 58 y.o.   MRN: [id number]  DOS:  01/07/2019 Type of visit - description: Complete physical exam Doing well, has no concerns.  Review of Systems   A 14 point review of systems is negative    Past Medical History:  Diagnosis Date  . DEPRESSION   . GERD (gastroesophageal reflux disease)   . HIV DISEASE   . IBS (irritable bowel syndrome)   . Sarcoidosis 2000   question of  . Ulnar nerve compression, right     Past Surgical History:  Procedure Laterality Date  . LUNG SURGERY  2000   vats  . TONSILLECTOMY AND ADENOIDECTOMY    . ULNAR NERVE TRANSPOSITION Right 05/17/2018   Procedure: RIGHT ULNAR NERVE DECOMPRESSION/TRANSPOSITION;  Surgeon: Leanora Cover, MD;  Location: [facility];  Service: Orthopedics;  Laterality: Right;    Social History   Socioeconomic History  . Marital status: Married    Spouse name: Not on file  . Number of children: 2  . Years of education: Not on file  . Highest education level: Not on file  Occupational History  . 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 Financial resource strain: Not on file  . Food insecurity:    Worry: Not on file    Inability: Not on file  . Transportation needs:    Medical: Not on file    Non-medical: Not on file  Tobacco Use  . Smoking status: Former Smoker    Packs/day: 1.00    Years: 2.00    Pack years: 2.00    Types: Cigarettes    Last attempt to quit: 11/01/1983    Years since quitting: 35.2  . Smokeless tobacco: Never Used  Substance and Sexual Activity  . Alcohol use: Yes    Alcohol/week: 1.0 standard drinks    Types: 1 Standard drinks or equivalent per week    Comment: socially   . Drug use: No  . Sexual activity: Yes    Comment: declined condoms  Lifestyle  . Physical activity:    Days per week: Not on file    Minutes per session: Not on file  . Stress: Not on file    Relationships  . Social connections:    Talks on phone: Not on file    Gets together: Not on file    Attends religious service: Not on file    Active member of club or organization: Not on file    Attends meetings of clubs or organizations: Not on file    Relationship status: Not on file  . Intimate partner violence:    Fear of current or ex partner: Not on file    Emotionally abused: Not on file    Physically abused: Not on file    Forced sexual activity: Not on file  Other Topics Concern  . Not on file  Social History Narrative   [name] ( married)     Family History  Problem Relation Age of Onset  . Hyperlipidemia Mother   . Hypertension Mother   . Cancer Mother        glioblastoma  . Sudden death Neg Hx   . Heart attack Neg Hx   . Diabetes Neg Hx   . Colon cancer Neg Hx   . Prostate cancer Neg Hx      Allergies as  of 01/07/2019      Reactions   Morphine Other (See Comments)   Resp distress, rash, tachycardia   Doxycycline Nausea And Vomiting   Celebrex [celecoxib] Other (See Comments)   Elevated LFT's   Wellbutrin [bupropion] Hives      Medication List       Accurate as of January 07, 2019 11:59 PM. Always use your most recent med list.        citalopram 20 MG tablet Commonly known as:  CELEXA Take 1 tablet (20 mg total) by mouth daily.   elvitegravir-cobicistat-emtricitabine-tenofovir 150-150-200-10 MG Tabs tablet Commonly known as:  Genvoya Take 1 tablet by mouth daily with breakfast.   famotidine 20 MG tablet Commonly known as:  PEPCID Take 20 mg by mouth 2 (two) times daily.   methocarbamol 500 MG tablet Commonly known as:  ROBAXIN Take 500 mg by mouth as needed.   sildenafil 100 MG tablet Commonly known as:  VIAGRA TAKE 1/2 TO 1 TABLET BY MOUTH DAILY AS NEEDED FOR ERECTILE DYSFUNCTION           Objective:   Physical Exam BP 120/74 (BP Location: Left Arm, Patient Position: Sitting, Cuff Size: Small)   Pulse (!) 58   Temp 98  F (36.7 C) (Oral)   Resp 16   Ht 5\' 8"  (1.727 m)   Wt 194 lb 2 oz (88.1 kg)   SpO2 96%   BMI 29.52 kg/m  General: Well developed, NAD, BMI noted Neck: No  thyromegaly  HEENT:  Normocephalic . Face symmetric, atraumatic Lungs:  CTA B Normal respiratory effort, no intercostal retractions, no accessory muscle use. Heart: RRR,  no murmur.  No pretibial edema bilaterally  Abdomen:  Not distended, soft, non-tender. No rebound or rigidity.   Skin: Exposed areas without rash. Not pale. Not jaundice Neurologic:  alert & oriented X3.  Speech normal, gait appropriate for age and unassisted Strength symmetric and appropriate for age.  Psych: Cognition and judgment appear intact.  Cooperative with normal attention span and concentration.  Behavior appropriate. No anxious or depressed appearing.     Assessment      Assessment: HIV + Depression ED Vitamin D deficiency ?sarcoidosis dx 2000 CP, presyncope, palpitation: 2018>> saw cardiology and pulmonary. CXR, ECHO, GTX, cardiac event monitor negative.    PLAN  HIV +: Follow-up by ID. Depression: Controlled on SSRIs Vitamin D deficiency: We will check labs Recovering well from right elbow surgery. RTC 1 year

## 2019-01-07 NOTE — Assessment & Plan Note (Addendum)
-  Immunizations: reviewed - DRE - PSA 2019 wnl - CCS: cscope 2010, Dr Shary Key WNL [facility] [place]); plans to see Dr Benson Norway 01/2019 for next cscope  -Labs reviewed we will get A1c, FLP, vitamin D -Diet and exercise discussed.  elliptical 2-3 w, diet regular

## 2019-01-07 NOTE — Progress Notes (Signed)
Pre visit review using our clinic review tool, if applicable. No additional management support is needed unless otherwise documented below in the visit note. 

## 2019-01-08 NOTE — Assessment & Plan Note (Signed)
HIV +: Follow-up by ID. Depression: Controlled on SSRIs Vitamin D deficiency: We will check labs Recovering well from right elbow surgery. RTC 1 year

## 2019-01-11 MED ORDER — VITAMIN D (ERGOCALCIFEROL) 1.25 MG (50000 UNIT) PO CAPS: 50000.0000 [IU] | ORAL_CAPSULE | ORAL | 3 refills | Status: DC

## 2019-01-11 NOTE — Addendum Note (Signed)
Addended byDamita Dunnings D on: 01/11/2019 08:36 AM   Modules accepted: Orders

## 2019-01-21 MED FILL — GENVOYA TABLET: 150-150-200 | 30 days supply | Qty: 30 | Fill #3

## 2019-01-21 MED FILL — CITALOPRAM HBR 20 MG TABLET: 20 | 90 days supply | Qty: 90 | Fill #1

## 2019-02-18 ENCOUNTER — Other Ambulatory Visit: Payer: Self-pay

## 2019-02-18 ENCOUNTER — Encounter: Payer: Self-pay | Admitting: Family Medicine

## 2019-02-18 ENCOUNTER — Ambulatory Visit: Payer: 59 | Admitting: Family Medicine

## 2019-02-18 VITALS — BP 149/94 | HR 72 | Temp 98.2°F | Ht 68.0 in | Wt 192.0 lb

## 2019-02-18 DIAGNOSIS — M10071 Idiopathic gout, right ankle and foot: Secondary | ICD-10-CM

## 2019-02-18 MED ORDER — DICLOFENAC SODIUM 75 MG PO TBEC: 75.0000 mg | DELAYED_RELEASE_TABLET | Freq: Two times a day (BID) | ORAL | 1 refills | Status: DC

## 2019-02-18 MED FILL — DICLOFENAC SODIUM 75 MG TAB: 75 | 30 days supply | Qty: 60 | Fill #0

## 2019-02-18 NOTE — Progress Notes (Signed)
PCP: Colon Branch, MD  Subjective:   HPI: Patient is a 58 y.o. male here for right foot pain.  Patient reports few days of pain at the base of his right toe.  Reports 8/10 sharp pain.  Pain was worse with trying to move his toe or with weightbearing/walking.  He denies any injury.  Denies a history of similar pain.  He denies any associated fevers or chills.  He has been using ibuprofen and trying Robaxin to help with pain.  He reports some pain with lightly bumping his foot.  He denies any associated scratches.  He does note significant erythema and some swelling of the first MTP joint.  No numbness or tingling.  No history of gout.  Past Medical History:  Diagnosis Date  . DEPRESSION   . GERD (gastroesophageal reflux disease)   . HIV DISEASE   . IBS (irritable bowel syndrome)   . Sarcoidosis 2000   question of  . Ulnar nerve compression, right     Current Outpatient Medications on File Prior to Visit  Medication Sig Dispense Refill  . citalopram (CELEXA) 20 MG tablet Take 1 tablet (20 mg total) by mouth daily. 90 tablet 3  . elvitegravir-cobicistat-emtricitabine-tenofovir (GENVOYA) 150-150-200-10 MG TABS tablet Take 1 tablet by mouth daily with breakfast. 90 tablet 3  . famotidine (PEPCID) 20 MG tablet Take 20 mg by mouth 2 (two) times daily.    . methocarbamol (ROBAXIN) 500 MG tablet Take 500 mg by mouth as needed.    . sildenafil (VIAGRA) 100 MG tablet TAKE 1/2 TO 1 TABLET BY MOUTH DAILY AS NEEDED FOR ERECTILE DYSFUNCTION 10 tablet 6  . Vitamin D, Ergocalciferol, (DRISDOL) 1.25 MG (50000 UT) CAPS capsule Take 1 capsule (50,000 Units total) by mouth every 7 (seven) days. 4 capsule 3   No current facility-administered medications on file prior to visit.     Past Surgical History:  Procedure Laterality Date  . LUNG SURGERY  2000   vats  . TONSILLECTOMY AND ADENOIDECTOMY    . ULNAR NERVE TRANSPOSITION Right 05/17/2018   Procedure: RIGHT ULNAR NERVE DECOMPRESSION/TRANSPOSITION;   Surgeon: Leanora Cover, MD;  Location: [facility];  Service: Orthopedics;  Laterality: Right;    Allergies  Allergen Reactions  . Morphine Other (See Comments)    Resp distress, rash, tachycardia  . Doxycycline Nausea And Vomiting  . Celebrex [Celecoxib] Other (See Comments)    Elevated LFT's  . Wellbutrin [Bupropion] Hives    Social History   Socioeconomic History  . Marital status: Married    Spouse name: Not on file  . Number of children: 2  . Years of education: Not on file  . Highest education level: Not on file  Occupational History  . 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 Financial resource strain: Not on file  . Food insecurity:    Worry: Not on file    Inability: Not on file  . Transportation needs:    Medical: Not on file    Non-medical: Not on file  Tobacco Use  . Smoking status: Former Smoker    Packs/day: 1.00    Years: 2.00    Pack years: 2.00    Types: Cigarettes    Last attempt to quit: 11/01/1983    Years since quitting: 35.3  . Smokeless tobacco: Never Used  Substance and Sexual Activity  . Alcohol use: Yes    Alcohol/week: 1.0 standard drinks  Types: 1 Standard drinks or equivalent per week    Comment: socially   . Drug use: No  . Sexual activity: Yes    Comment: declined condoms  Lifestyle  . Physical activity:    Days per week: Not on file    Minutes per session: Not on file  . Stress: Not on file  Relationships  . Social connections:    Talks on phone: Not on file    Gets together: Not on file    Attends religious service: Not on file    Active member of club or organization: Not on file    Attends meetings of clubs or organizations: Not on file    Relationship status: Not on file  . Intimate partner violence:    Fear of current or ex partner: Not on file    Emotionally abused: Not on file    Physically abused: Not on file    Forced sexual activity: Not on file  Other  Topics Concern  . Not on file  Social History Narrative   [name] ( married)    Family History  Problem Relation Age of Onset  . Hyperlipidemia Mother   . Hypertension Mother   . Cancer Mother        glioblastoma  . Sudden death Neg Hx   . Heart attack Neg Hx   . Diabetes Neg Hx   . Colon cancer Neg Hx   . Prostate cancer Neg Hx     There were no vitals taken for this visit.  Review of Systems: See HPI above.     Objective:  Physical Exam:  Gen: awake, alert, NAD, comfortable in exam room Pulm: breathing unlabored  Right Foot: Inspection: Mild swelling and erythema at the first MTP.  No breaks in the skin. Palpation:  Exquisitely tender at the 1st MTP. Increased warmth ROM: Full ROM of the ankle. Limited ROM of the great toe due to pain Strength: normal strength Neurovascular: N/V intact  Left foot: No deformity or swelling No TTP, normal temperature to touch Full ROM 5/5 strength NV intact   Assessment & Plan:  1. Acute gout of the right 1st MTP - no breaks in the skin or fevers/chills to raise concern for infection - diclofenac  75mg  BID - cam walker for comfort - if no improvement in next few days, will call office, consider steroid injection - otherwise f/u prn

## 2019-02-18 NOTE — Patient Instructions (Addendum)
You have an acute gout flare. Ice the area 15 minutes at a time 3-4 times a day. Diclofenac 75mg  - take now and this evening then start taking twice a day tomorrow. You can stop these medications the day after the flare is over. Cam walker when up and walking around. If not improving between now and Thursday morning call us Thursday and we can inject this in the [place] office. Let me know how you're doing in a week through mychart otherwise.

## 2019-02-19 ENCOUNTER — Encounter: Payer: Self-pay | Admitting: Family Medicine

## 2019-02-21 MED FILL — GENVOYA TABLET: 150-150-200 | 30 days supply | Qty: 30 | Fill #4

## 2019-03-27 MED FILL — GENVOYA TABLET: 150-150-200 | 30 days supply | Qty: 30 | Fill #5 | Status: TO

## 2019-04-08 ENCOUNTER — Ambulatory Visit: Payer: Self-pay | Admitting: Internal Medicine

## 2019-04-08 ENCOUNTER — Encounter (HOSPITAL_BASED_OUTPATIENT_CLINIC_OR_DEPARTMENT_OTHER): Payer: Self-pay | Admitting: Emergency Medicine

## 2019-04-08 ENCOUNTER — Emergency Department (HOSPITAL_BASED_OUTPATIENT_CLINIC_OR_DEPARTMENT_OTHER)
Admission: EM | Admit: 2019-04-08 | Discharge: 2019-04-08 | Disposition: A | Discharge status: Home / Self Care | Payer: 59 | Attending: Emergency Medicine | Admitting: Emergency Medicine

## 2019-04-08 ENCOUNTER — Emergency Department (HOSPITAL_BASED_OUTPATIENT_CLINIC_OR_DEPARTMENT_OTHER): Payer: 59

## 2019-04-08 ENCOUNTER — Other Ambulatory Visit: Payer: Self-pay

## 2019-04-08 DIAGNOSIS — Z20828 Contact with and (suspected) exposure to other viral communicable diseases: Secondary | ICD-10-CM | POA: Insufficient documentation

## 2019-04-08 DIAGNOSIS — Z87891 Personal history of nicotine dependence: Secondary | ICD-10-CM | POA: Insufficient documentation

## 2019-04-08 DIAGNOSIS — R5383 Other fatigue: Secondary | ICD-10-CM | POA: Diagnosis not present

## 2019-04-08 DIAGNOSIS — Z79899 Other long term (current) drug therapy: Secondary | ICD-10-CM | POA: Insufficient documentation

## 2019-04-08 DIAGNOSIS — B20 Human immunodeficiency virus [HIV] disease: Secondary | ICD-10-CM | POA: Insufficient documentation

## 2019-04-08 DIAGNOSIS — R079 Chest pain, unspecified: Secondary | ICD-10-CM | POA: Insufficient documentation

## 2019-04-08 DIAGNOSIS — R0602 Shortness of breath: Secondary | ICD-10-CM | POA: Diagnosis not present

## 2019-04-08 DIAGNOSIS — R0789 Other chest pain: Secondary | ICD-10-CM | POA: Diagnosis not present

## 2019-04-08 LAB — BASIC METABOLIC PANEL
Anion gap: 11 (ref 5–15)
BUN: 21 mg/dL — ABNORMAL HIGH (ref 6–20)
CO2: 23 mmol/L (ref 22–32)
Calcium: 9.6 mg/dL (ref 8.9–10.3)
Chloride: 104 mmol/L (ref 98–111)
Creatinine, Ser: 1.17 mg/dL (ref 0.61–1.24)
GFR calc Af Amer: >60 mL/min (ref >60)
GFR calc non Af Amer: >60 mL/min (ref >60)
Glucose, Bld: 142 mg/dL — ABNORMAL HIGH (ref 70–99)
Potassium: 3.5 mmol/L (ref 3.5–5.1)
Sodium: 138 mmol/L (ref 135–145)

## 2019-04-08 LAB — D-DIMER, QUANTITATIVE: D-Dimer, Quant: <0.27 ug/mL-FEU (ref 0.00–0.50)

## 2019-04-08 LAB — CBC
HCT: 47.1 % (ref 39.0–52.0)
Hemoglobin: 16.1 g/dL (ref 13.0–17.0)
MCH: 31.9 pg (ref 26.0–34.0)
MCHC: 34.2 g/dL (ref 30.0–36.0)
MCV: 93.5 fL (ref 80.0–100.0)
Platelets: 184 10*3/uL (ref 150–400)
RBC: 5.04 MIL/uL (ref 4.22–5.81)
RDW: 12.8 % (ref 11.5–15.5)
WBC: 7.4 10*3/uL (ref 4.0–10.5)
nRBC: 0 % (ref 0.0–0.2)

## 2019-04-08 LAB — TROPONIN I
Troponin I: <0.03 ng/mL (ref <0.03)
Troponin I: <0.03 ng/mL (ref <0.03)

## 2019-04-08 LAB — SARS CORONAVIRUS 2 AG (30 MIN TAT): SARS Coronavirus 2 Ag: NEGATIVE

## 2019-04-08 MED ORDER — NITROGLYCERIN 0.4 MG SL SUBL
0.4000 mg | SUBLINGUAL_TABLET | SUBLINGUAL | Status: DC | PRN
  Administered 2019-04-08: 0.4 mg via SUBLINGUAL
  Filled 2019-04-08: qty 1

## 2019-04-08 MED ORDER — ASPIRIN 81 MG PO CHEW
324.0000 mg | CHEWABLE_TABLET | Freq: Once | ORAL | Status: AC
  Administered 2019-04-08: 15:00:00 324 mg via ORAL
  Filled 2019-04-08: qty 4

## 2019-04-08 NOTE — Discharge Instructions (Signed)
Thank you for allowing me to care for you today. Please return to the emergency department if you have new or worsening symptoms.  Please follow-up with your cardiologist and your primary care doctor.

## 2019-04-08 NOTE — Telephone Encounter (Signed)
FYI

## 2019-04-08 NOTE — Telephone Encounter (Signed)
Pt called in saying he left work early because he is having chest pressure and shortness of breath with extreme fatigue.   He has GERD but he said this feels very different than the GERD.   This is a non-radiating pressure.  I instructed him to to go the ED however he is near an urgent care so he is going in there now.  I sent these notes to Dr. Larose Kells.    Reason for Disposition . [1] Chest pain lasts > 5 minutes AND [2] described as crushing, pressure-like, or heavy  Answer Assessment - Initial Assessment Questions 1. LOCATION: "Where does it hurt?"       I waNos at work in [place].   I'm uncomfortable in my chest, short of breath, very weak.  It's a pressure.   Non radiating 2. RADIATION: "Does the pain go anywhere else?" (e.g., into neck, jaw, arms, back)     No nausea or vomiting 3. ONSET: "When did the chest pain begin?" (Minutes, hours or days)      10:00 am this morning.   I went to lunch and ate it did not help. 4. PATTERN "Does the pain come and go, or has it been constant since it started?"  "Does it get worse with exertion?"      Constant 5. DURATION: "How long does it last" (e.g., seconds, minutes, hours)     Constant since 10:00 6. SEVERITY: "How bad is the pain?"  (e.g., Scale 1-10; mild, moderate, or severe)    - MILD (1-3): doesn't interfere with normal activities     - MODERATE (4-7): interferes with normal activities or awakens from sleep    - SEVERE (8-10): excruciating pain, unable to do any normal activities       4-5 on scale 7. CARDIAC RISK FACTORS: "Do you have any history of heart problems or risk factors for heart disease?" (e.g., prior heart attack, angina; high blood pressure, diabetes, being overweight, high cholesterol, smoking, or strong family history of heart disease)     I've had a cardiac work up before and it was fine. 8. PULMONARY RISK FACTORS: "Do you have any history of lung disease?"  (e.g., blood clots in lung, asthma, emphysema, birth control  pills)     No 9. CAUSE: "What do you think is causing the chest pain?"     I feel a pressure. 10. OTHER SYMPTOMS: "Do you have any other symptoms?" (e.g., dizziness, nausea, vomiting, sweating, fever, difficulty breathing, cough)       No dizziness, sweating or nausea.   This pressure just feels different than the indigestion I've had before. 11. PREGNANCY: "Is there any chance you are pregnant?" "When was your last menstrual period?"       N/A  Protocols used: CHEST PAIN-A-AH

## 2019-04-08 NOTE — ED Triage Notes (Signed)
PT presents with c/o chest pain and shortness of breath since 10 am this morning. 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

## 2019-04-08 NOTE — ED Provider Notes (Signed)
[facility] EMERGENCY DEPARTMENT Provider Note   CSN: [id number] Arrival date & time: 04/08/19  1422    History   Chief Complaint Chief Complaint  Patient presents with  . Chest Pain    HPI Bruce Woods is a 57 y.o. male.     Patient is a 58 year old male past medical history of HIV, GERD, depression, sarcoidosis who presents emergency department for chest pain.  Patient was at work at [employer] today when he was walking with the patient and all of a sudden began to feel substernal chest pain with shortness of breath and generalized weakness and fatigue.  Reports that this remained constant.  Reports just feeling overall tired and fatigued.  Denies any nausea, vomiting, diarrhea.No fever, cough, leg swelling, diaphoresis.      Past Medical History:  Diagnosis Date  . DEPRESSION   . GERD (gastroesophageal reflux disease)   . HIV DISEASE   . IBS (irritable bowel syndrome)   . Sarcoidosis 2000   question of  . Ulnar nerve compression, right     Patient Active Problem List   Diagnosis Date Noted  . Right elbow pain 04/04/2018  . Dyspnea on exertion 08/01/2017  . PCP NOTES >>>>>>>>>>>>>>>>>>>>>>>>> 01/01/2016  . Insomnia 11/19/2013  . Annual physical exam 10/01/2013  . Fatigue, low vit D 08/19/2011  . Erectile dysfunction 08/19/2011  . Depression 01/03/2007  . Human immunodeficiency virus (HIV) disease ([place]) 08/18/2006  . SARCOIDOSIS 08/18/2006    Past Surgical History:  Procedure Laterality Date  . LUNG SURGERY  2000   vats  . TONSILLECTOMY AND ADENOIDECTOMY    . ULNAR NERVE TRANSPOSITION Right 05/17/2018   Procedure: RIGHT ULNAR NERVE DECOMPRESSION/TRANSPOSITION;  Surgeon: Leanora Cover, MD;  Location: [facility];  Service: Orthopedics;  Laterality: Right;        Home Medications    Prior to Admission medications   Medication Sig Start Date End Date Taking? Authorizing Provider  citalopram (CELEXA) 20 MG tablet Take  1 tablet (20 mg total) by mouth daily. 10/16/18   Michel Bickers, MD  diclofenac (VOLTAREN) 75 MG EC tablet Take 1 tablet (75 mg total) by mouth 2 (two) times daily. 02/18/19   Dene Gentry, MD  elvitegravir-cobicistat-emtricitabine-tenofovir (GENVOYA) 150-150-200-10 MG TABS tablet Take 1 tablet by mouth daily with breakfast. 10/16/18   Michel Bickers, MD  famotidine (PEPCID) 20 MG tablet Take 20 mg by mouth 2 (two) times daily.    [provider]  methocarbamol (ROBAXIN) 500 MG tablet Take 500 mg by mouth as needed.    [provider]  sildenafil (VIAGRA) 100 MG tablet TAKE 1/2 TO 1 TABLET BY MOUTH DAILY AS NEEDED FOR ERECTILE DYSFUNCTION 03/08/18   Colon Branch, MD  Vitamin D, Ergocalciferol, (DRISDOL) 1.25 MG (50000 UT) CAPS capsule Take 1 capsule (50,000 Units total) by mouth every 7 (seven) days. 01/11/19   Colon Branch, MD    Family History Family History  Problem Relation Age of Onset  . Hyperlipidemia Mother   . Hypertension Mother   . Cancer Mother        glioblastoma  . Sudden death Neg Hx   . Heart attack Neg Hx   . Diabetes Neg Hx   . Colon cancer Neg Hx   . Prostate cancer Neg Hx     Social History Social History   Tobacco Use  . Smoking status: Former Smoker    Packs/day: 1.00    Years: 2.00  Pack years: 2.00    Types: Cigarettes    Last attempt to quit: 11/01/1983    Years since quitting: 35.4  . Smokeless tobacco: Never Used  Substance Use Topics  . Alcohol use: Yes    Alcohol/week: 1.0 standard drinks    Types: 1 Standard drinks or equivalent per week    Comment: socially   . Drug use: No     Allergies   Morphine; Doxycycline; Celebrex [celecoxib]; and Wellbutrin [bupropion]   Review of Systems Review of Systems  Constitutional: Positive for fatigue.  HENT: Negative for congestion and sore throat.   Respiratory: Positive for shortness of breath. Negative for apnea, cough and chest tightness.   Cardiovascular: Positive for chest  pain. Negative for palpitations and leg swelling.  Gastrointestinal: Negative for diarrhea, nausea and vomiting.  Musculoskeletal: Negative for back pain.  Skin: Negative for rash and wound.  Neurological: Negative for dizziness, weakness and headaches.     Physical Exam Updated Vital Signs BP (!) 155/94 (BP Location: Left Arm)   Pulse (!) 54   Temp 98 F (36.7 C) (Oral)   Resp 18   Ht 5\' 8"  (1.727 m)   Wt 87.1 kg   SpO2 100%   BMI 29.19 kg/m   Physical Exam Vitals signs and nursing note reviewed.  Constitutional:      Appearance: He is well-developed and normal weight. He is not ill-appearing or toxic-appearing.  HENT:     Head: Normocephalic and atraumatic.  Eyes:     Pupils: Pupils are equal, round, and reactive to light.  Cardiovascular:     Rate and Rhythm: Normal rate and regular rhythm.     Heart sounds: Normal heart sounds.  Pulmonary:     Effort: Pulmonary effort is normal. No tachypnea.     Breath sounds: Normal breath sounds.  Chest:     Chest wall: No mass, deformity or tenderness.  Abdominal:     General: Bowel sounds are normal.     Palpations: Abdomen is soft.     Tenderness: There is no abdominal tenderness.  Musculoskeletal:     Right lower leg: No edema.     Left lower leg: No edema.  Neurological:     Mental Status: He is alert.      ED Treatments / Results  Labs (all labs ordered are listed, but only abnormal results are displayed) Labs Reviewed  BASIC METABOLIC PANEL - Abnormal; Notable for the following components:      Result Value   Glucose, Bld 142 (*)    BUN 21 (*)    All other components within normal limits  SARS CORONAVIRUS 2 (HOSP ORDER, PERFORMED IN [facility] VIA ABBOTT ID)  CBC  TROPONIN I  TROPONIN I  D-DIMER, QUANTITATIVE (NOT AT [facility])    EKG EKG Interpretation  Date/Time:  Monday April 08 2019 14:31:32 EDT Ventricular Rate:  80 PR Interval:  200 QRS Duration: 94 QT Interval:  380 QTC Calculation: 438 R  Axis:   -4 Text Interpretation:  Normal sinus rhythm Cannot rule out Inferior infarct , age undetermined Abnormal ECG No significant change since last tracing Confirmed by Deno Etienne [phone]) on 04/08/2019 5:13:44 PM   Radiology Dg Chest 2 View  Result Date: 04/08/2019 CLINICAL DATA:  Chest pain and shortness of breath EXAM: CHEST - 2 VIEW COMPARISON:  September 25, 2018 FINDINGS: Postoperative change noted in the right mid lung. No edema or consolidation. Heart size and pulmonary vascularity normal. No adenopathy. No  pneumothorax. No bone lesions. IMPRESSION: Postoperative change in right mid lung. No edema or consolidation. Stable cardiac silhouette. Electronically Signed   By: Lowella Grip III M.D.   On: 04/08/2019 15:06    Procedures Procedures (including critical care time)  Medications Ordered in ED Medications  nitroGLYCERIN (NITROSTAT) SL tablet 0.4 mg (0.4 mg Sublingual Given 04/08/19 1513)  aspirin chewable tablet 324 mg (324 mg Oral Given 04/08/19 1506)     Initial Impression / Assessment and Plan / ED Course  I have reviewed the triage vital signs and the nursing notes.  Pertinent labs & imaging results that were available during my care of the patient were reviewed by me and considered in my medical decision making (see chart for details).  Clinical Course as of Apr 07 2036  Mon Apr 08, 2019  1559 Patient was improved with aspirin and nitro, resting comfortably. Discussed admission with patient. Patient does not want to be admitted. Patient reports he could f/u with his cardiologist outpatient. I am going to get 2nd troponin and covid test and will reassess the patient.   [KM]  2033 Heart score 1. Also seen by Dr. Tyrone Nine. Negative trop x2, patient improved and stable. Patient wants to be d/c home and not admitted. Will have him f/u PMD and cardiology   [KM]    Clinical Course User Index [KM] Alveria Apley, PA-C         Final Clinical Impressions(s) / ED Diagnoses    Final diagnoses:  Chest pain, unspecified type    ED Discharge Orders    None       Kristine Royal 04/08/19 2038    Deno Etienne, DO 04/08/19 2309

## 2019-04-08 NOTE — ED Notes (Signed)
Pt. Reports at work today started feeling really exhausted and tired with mid sternal chest pain.  Pt. Reports he has had episodes in past with this feeling being different in nature.  Pt. Reports feeling winded with exhaustion.

## 2019-04-09 NOTE — Telephone Encounter (Signed)
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

## 2019-04-11 ENCOUNTER — Other Ambulatory Visit: Payer: Self-pay

## 2019-04-11 ENCOUNTER — Other Ambulatory Visit: Payer: 59

## 2019-04-11 DIAGNOSIS — B20 Human immunodeficiency virus [HIV] disease: Secondary | ICD-10-CM | POA: Diagnosis not present

## 2019-04-12 LAB — T-HELPER CELL (CD4) - (RCID CLINIC ONLY)
CD4 % Helper T Cell: 24 % — ABNORMAL LOW (ref 33–65)
CD4 T Cell Abs: 479 /uL (ref 400–1790)

## 2019-04-20 LAB — CBC
HCT: 45.6 % (ref 38.5–50.0)
Hemoglobin: 16.2 g/dL (ref 13.2–17.1)
MCH: 33.1 pg — ABNORMAL HIGH (ref 27.0–33.0)
MCHC: 35.5 g/dL (ref 32.0–36.0)
MCV: 93.1 fL (ref 80.0–100.0)
MPV: 11.1 fL (ref 7.5–12.5)
Platelets: 176 10*3/uL (ref 140–400)
RBC: 4.9 10*6/uL (ref 4.20–5.80)
RDW: 13.2 % (ref 11.0–15.0)
WBC: 5.6 10*3/uL (ref 3.8–10.8)

## 2019-04-20 LAB — HIV-1 RNA QUANT-NO REFLEX-BLD
HIV 1 RNA Quant: <20 copies/mL — AB
HIV-1 RNA Quant, Log: <1.3 Log copies/mL — AB

## 2019-04-20 LAB — COMPREHENSIVE METABOLIC PANEL
AG Ratio: 1.6 (calc) (ref 1.0–2.5)
ALT: 18 U/L (ref 9–46)
AST: 17 U/L (ref 10–35)
Albumin: 4.2 g/dL (ref 3.6–5.1)
Alkaline phosphatase (APISO): 57 U/L (ref 35–144)
BUN: 18 mg/dL (ref 7–25)
CO2: 27 mmol/L (ref 20–32)
Calcium: 9.4 mg/dL (ref 8.6–10.3)
Chloride: 104 mmol/L (ref 98–110)
Creat: 1.15 mg/dL (ref 0.70–1.25)
Globulin: 2.6 g/dL (calc) (ref 1.9–3.7)
Glucose, Bld: 85 mg/dL (ref 65–99)
Potassium: 4.2 mmol/L (ref 3.5–5.3)
Sodium: 140 mmol/L (ref 135–146)
Total Bilirubin: 0.9 mg/dL (ref 0.2–1.2)
Total Protein: 6.8 g/dL (ref 6.1–8.1)

## 2019-04-20 LAB — RPR: RPR Ser Ql: NONREACTIVE

## 2019-04-20 LAB — LIPID PANEL
Cholesterol: 176 mg/dL (ref <200)
HDL: 30 mg/dL — ABNORMAL LOW (ref >=40)
LDL Cholesterol (Calc): 115 mg/dL (calc) — ABNORMAL HIGH
Non-HDL Cholesterol (Calc): 146 mg/dL (calc) — ABNORMAL HIGH (ref <130)
Total CHOL/HDL Ratio: 5.9 (calc) — ABNORMAL HIGH (ref <5.0)
Triglycerides: 184 mg/dL — ABNORMAL HIGH (ref <150)

## 2019-04-22 MED FILL — GENVOYA TABLET: 150-150-200 | 30 days supply | Qty: 30 | Fill #0

## 2019-04-30 ENCOUNTER — Encounter: Payer: Self-pay | Admitting: Internal Medicine

## 2019-05-01 ENCOUNTER — Other Ambulatory Visit: Payer: Self-pay | Admitting: Internal Medicine

## 2019-05-01 MED ORDER — CLONAZEPAM 0.5 MG PO TABS: 0.5000 mg | ORAL_TABLET | Freq: Every evening | ORAL | 0 refills | Status: DC | PRN

## 2019-05-01 MED FILL — clonazePAM 0.5 MG TABS: 0.5 | 21 days supply | Qty: 21 | Fill #0

## 2019-05-07 NOTE — Telephone Encounter (Signed)
LMOM informing we have not seen FMLA paperwork for Pt. Recommended they have [employer] resend.

## 2019-05-07 NOTE — Telephone Encounter (Signed)
Pt's wife came in office stating pt is wanting to know if FMLA paperwork came in office thru fax and if so if provider has any questions to please call pt at [phone].

## 2019-05-10 MED FILL — CITALOPRAM HBR 20 MG TABLET: 20 | 90 days supply | Qty: 90 | Fill #2

## 2019-05-13 ENCOUNTER — Telehealth: Payer: Self-pay

## 2019-05-13 NOTE — Telephone Encounter (Signed)
Form completed.

## 2019-05-13 NOTE — Telephone Encounter (Signed)
Paperwork received and placed in MD red folder for completion.

## 2019-05-13 NOTE — Telephone Encounter (Signed)
Form faxed to [employer] at [phone]. Form sent for scanning.  

## 2019-05-14 NOTE — Telephone Encounter (Signed)
Received fax confirmation

## 2019-05-22 ENCOUNTER — Other Ambulatory Visit: Payer: Self-pay | Admitting: Internal Medicine

## 2019-05-22 ENCOUNTER — Other Ambulatory Visit: Payer: Self-pay | Admitting: *Deleted

## 2019-05-22 DIAGNOSIS — B20 Human immunodeficiency virus [HIV] disease: Secondary | ICD-10-CM

## 2019-05-22 MED ORDER — GENVOYA 150-150-200-10 MG PO TABS: 1.0000 | ORAL_TABLET | Freq: Every day | ORAL | 1 refills | Status: DC

## 2019-05-22 MED FILL — GENVOYA TABLET: 150-150-200 | 30 days supply | Qty: 30 | Fill #1

## 2019-05-28 ENCOUNTER — Telehealth: Payer: Self-pay

## 2019-05-28 NOTE — Telephone Encounter (Signed)
Forms received- placed in PCP red folder for completion.

## 2019-05-29 ENCOUNTER — Telehealth: Payer: Self-pay | Admitting: Internal Medicine

## 2019-05-29 NOTE — Telephone Encounter (Signed)
Pt's wife dropped off document to be filled out by provider [facility] document- 3 pages) Pt would like document to be faxed [phone] when document ready. Document put at front office tray under providers name.

## 2019-05-29 NOTE — Telephone Encounter (Signed)
Duplicate form. We received forms via fax yesterday.

## 2019-05-30 ENCOUNTER — Ambulatory Visit (INDEPENDENT_AMBULATORY_CARE_PROVIDER_SITE_OTHER): Payer: 59 | Admitting: Internal Medicine

## 2019-05-30 DIAGNOSIS — F411 Generalized anxiety disorder: Secondary | ICD-10-CM

## 2019-05-30 DIAGNOSIS — F3342 Major depressive disorder, recurrent, in full remission: Secondary | ICD-10-CM | POA: Diagnosis not present

## 2019-05-30 DIAGNOSIS — F5105 Insomnia due to other mental disorder: Secondary | ICD-10-CM | POA: Diagnosis not present

## 2019-05-30 DIAGNOSIS — F419 Anxiety disorder, unspecified: Secondary | ICD-10-CM | POA: Diagnosis not present

## 2019-05-30 NOTE — Telephone Encounter (Signed)
Mychart message sent.

## 2019-05-30 NOTE — Telephone Encounter (Signed)
Appt scheduled
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
## 2019-05-30 NOTE — Telephone Encounter (Signed)
Schedule virtual visit please

## 2019-05-30 NOTE — Telephone Encounter (Signed)
Form completed during OV and faxed to [employer] at [phone]. Form sent for scanning. Received fax confirmation.
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
## 2019-06-12 DIAGNOSIS — H10413 Chronic giant papillary conjunctivitis, bilateral: Secondary | ICD-10-CM | POA: Diagnosis not present

## 2019-06-12 DIAGNOSIS — H04123 Dry eye syndrome of bilateral lacrimal glands: Secondary | ICD-10-CM | POA: Diagnosis not present

## 2019-06-12 DIAGNOSIS — H2513 Age-related nuclear cataract, bilateral: Secondary | ICD-10-CM | POA: Diagnosis not present

## 2019-06-12 DIAGNOSIS — D3131 Benign neoplasm of right choroid: Secondary | ICD-10-CM | POA: Diagnosis not present

## 2019-06-20 MED FILL — GENVOYA TABLET: 150-150-200 | 30 days supply | Qty: 30 | Fill #2

## 2019-07-16 DIAGNOSIS — N486 Induration penis plastica: Secondary | ICD-10-CM | POA: Diagnosis not present

## 2019-07-25 MED FILL — GENVOYA TABLET: 150-150-200 | 30 days supply | Qty: 30 | Fill #3

## 2019-08-01 DIAGNOSIS — N486 Induration penis plastica: Secondary | ICD-10-CM | POA: Diagnosis not present

## 2019-08-07 MED FILL — CITALOPRAM HBR 20 MG TABLET: 20 | 90 days supply | Qty: 90 | Fill #3

## 2019-08-23 MED FILL — GENVOYA TABLET: 150-150-200 | 30 days supply | Qty: 30 | Fill #4

## 2019-08-23 MED FILL — XIAFLEX 0.9 MG SOLR: 0.9 | 3 days supply | Qty: 2 | Fill #0

## 2019-08-28 MED FILL — XIAFLEX 0.9 MG SOLR: 0.9 | 3 days supply | Qty: 2 | Fill #0

## 2019-09-10 DIAGNOSIS — N486 Induration penis plastica: Secondary | ICD-10-CM | POA: Diagnosis not present

## 2019-09-12 DIAGNOSIS — N486 Induration penis plastica: Secondary | ICD-10-CM | POA: Diagnosis not present

## 2019-09-23 MED FILL — GENVOYA TABLET: 150-150-200 | 30 days supply | Qty: 30 | Fill #5

## 2019-10-08 ENCOUNTER — Other Ambulatory Visit: Payer: 59

## 2019-10-08 ENCOUNTER — Other Ambulatory Visit: Payer: Self-pay

## 2019-10-08 DIAGNOSIS — B20 Human immunodeficiency virus [HIV] disease: Secondary | ICD-10-CM

## 2019-10-09 LAB — T-HELPER CELL (CD4) - (RCID CLINIC ONLY)
CD4 % Helper T Cell: 21 % — ABNORMAL LOW (ref 33–65)
CD4 T Cell Abs: 418 /uL (ref 400–1790)

## 2019-10-11 MED FILL — XIAFLEX 0.9 MG SOLR: 0.9 | 3 days supply | Qty: 2 | Fill #1

## 2019-10-19 LAB — HIV-1 RNA QUANT-NO REFLEX-BLD
HIV 1 RNA Quant: <20 copies/mL
HIV-1 RNA Quant, Log: <1.3 Log copies/mL

## 2019-10-21 DIAGNOSIS — N486 Induration penis plastica: Secondary | ICD-10-CM | POA: Diagnosis not present

## 2019-10-22 ENCOUNTER — Ambulatory Visit (INDEPENDENT_AMBULATORY_CARE_PROVIDER_SITE_OTHER): Payer: 59 | Admitting: Internal Medicine

## 2019-10-22 ENCOUNTER — Other Ambulatory Visit: Payer: Self-pay

## 2019-10-22 DIAGNOSIS — B20 Human immunodeficiency virus [HIV] disease: Secondary | ICD-10-CM | POA: Diagnosis not present

## 2019-10-22 DIAGNOSIS — F3342 Major depressive disorder, recurrent, in full remission: Secondary | ICD-10-CM | POA: Diagnosis not present

## 2019-10-22 MED ORDER — GENVOYA 150-150-200-10 MG PO TABS: 1.0000 | ORAL_TABLET | Freq: Every day | ORAL | 3 refills | Status: DC

## 2019-10-22 MED ORDER — CITALOPRAM HYDROBROMIDE 20 MG PO TABS: 20.0000 mg | ORAL_TABLET | Freq: Every day | ORAL | 3 refills | Status: DC

## 2019-10-22 MED FILL — GENVOYA TABLET: 150-150-200 | 30 days supply | Qty: 30 | Fill #0
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
## 2019-10-22 NOTE — Assessment & Plan Note (Signed)
His infection remains under excellent, long-term control.  He has already received his influenza vaccine and has scheduled for his first COVID-19 vaccine on 10/30/2019.  He will continue Genvoya and follow-up after lab work in 1 year.

## 2019-10-23 DIAGNOSIS — N486 Induration penis plastica: Secondary | ICD-10-CM | POA: Diagnosis not present

## 2019-11-11 MED FILL — CITALOPRAM HBR 20 MG TABLET: 20 | 90 days supply | Qty: 90 | Fill #0

## 2019-11-21 ENCOUNTER — Other Ambulatory Visit: Payer: Self-pay | Admitting: Internal Medicine

## 2019-11-21 ENCOUNTER — Telehealth: Payer: Self-pay | Admitting: *Deleted

## 2019-11-21 DIAGNOSIS — B20 Human immunodeficiency virus [HIV] disease: Secondary | ICD-10-CM

## 2019-11-21 MED ORDER — BICTEGRAVIR-EMTRICITAB-TENOFOV 50-200-25 MG PO TABS: 1.0000 | ORAL_TABLET | Freq: Every day | ORAL | 11 refills | Status: DC

## 2019-11-21 MED FILL — GENVOYA TABLET: 150-150-200 | 30 days supply | Qty: 30 | Fill #0

## 2019-11-21 NOTE — Telephone Encounter (Signed)
Excellent. I will notify patient.

## 2019-11-21 NOTE — Telephone Encounter (Signed)
Genvoya requiring prior authorization.  Patient's preferred formulary includes these single dose regimens without prior authorization:  biktarvy  odefsey  Please advise if one of those would be acceptable or if you prefer to pursue prior authorization for Genvoya. Landis Gandy, RN

## 2019-11-21 NOTE — Telephone Encounter (Signed)
Biktarvy is excellent lateral move.  He can take it with or without food.  It has very few interactions with other medications and is easier to swallow and tolerate.  I will send in prescriptions for Biktarvy and stop Genvoya now

## 2019-11-27 MED FILL — XIAFLEX 0.9 MG SOLR: 0.9 | 3 days supply | Qty: 2 | Fill #2

## 2019-12-02 DIAGNOSIS — N486 Induration penis plastica: Secondary | ICD-10-CM | POA: Diagnosis not present

## 2019-12-04 DIAGNOSIS — N486 Induration penis plastica: Secondary | ICD-10-CM | POA: Diagnosis not present

## 2019-12-20 MED FILL — GENVOYA TABLET: 150-150-200 | 30 days supply | Qty: 30 | Fill #1

## 2020-01-10 ENCOUNTER — Other Ambulatory Visit: Payer: Self-pay

## 2020-01-13 ENCOUNTER — Ambulatory Visit (INDEPENDENT_AMBULATORY_CARE_PROVIDER_SITE_OTHER): Payer: 59 | Admitting: Internal Medicine

## 2020-01-13 ENCOUNTER — Encounter: Payer: Self-pay | Admitting: Internal Medicine

## 2020-01-13 ENCOUNTER — Other Ambulatory Visit: Payer: Self-pay

## 2020-01-13 VITALS — BP 145/90 | HR 65 | Temp 96.1°F | Resp 16 | Ht 68.0 in | Wt 191.1 lb

## 2020-01-13 DIAGNOSIS — E559 Vitamin D deficiency, unspecified: Secondary | ICD-10-CM | POA: Diagnosis not present

## 2020-01-13 DIAGNOSIS — Z0000 Encounter for general adult medical examination without abnormal findings: Secondary | ICD-10-CM | POA: Diagnosis not present

## 2020-01-13 LAB — COMPREHENSIVE METABOLIC PANEL
ALT: 14 U/L (ref 0–53)
AST: 16 U/L (ref 0–37)
Albumin: 4.3 g/dL (ref 3.5–5.2)
Alkaline Phosphatase: 60 U/L (ref 39–117)
BUN: 15 mg/dL (ref 6–23)
CO2: 31 mEq/L (ref 19–32)
Calcium: 9.3 mg/dL (ref 8.4–10.5)
Chloride: 101 mEq/L (ref 96–112)
Creatinine, Ser: 1.09 mg/dL (ref 0.40–1.50)
GFR: 68.79 mL/min (ref >60.00)
Glucose, Bld: 85 mg/dL (ref 70–99)
Potassium: 3.6 mEq/L (ref 3.5–5.1)
Sodium: 139 mEq/L (ref 135–145)
Total Bilirubin: 0.8 mg/dL (ref 0.2–1.2)
Total Protein: 7.1 g/dL (ref 6.0–8.3)

## 2020-01-13 LAB — LIPID PANEL
Cholesterol: 181 mg/dL (ref 0–200)
HDL: 32.8 mg/dL — ABNORMAL LOW (ref >39.00)
LDL Cholesterol: 119 mg/dL — ABNORMAL HIGH (ref 0–99)
NonHDL: 148.22
Total CHOL/HDL Ratio: 6
Triglycerides: 144 mg/dL (ref 0.0–149.0)
VLDL: 28.8 mg/dL (ref 0.0–40.0)

## 2020-01-13 LAB — VITAMIN D 25 HYDROXY (VIT D DEFICIENCY, FRACTURES): VITD: 18.22 ng/mL — ABNORMAL LOW (ref 30.00–100.00)

## 2020-01-13 LAB — PSA: PSA: 0.48 ng/mL (ref 0.10–4.00)

## 2020-01-13 NOTE — Progress Notes (Signed)
Pre visit review using our clinic review tool, if applicable. No additional management support is needed unless otherwise documented below in the visit note. 
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
## 2020-01-13 NOTE — Patient Instructions (Signed)
GO TO THE LAB : Get the blood work     Oakley back for a physical exam in 1 year, please make an appointment  Check your blood pressure at work or at home twice a month BP GOAL is between 110/65 and  135/85. If it is consistently higher or lower, let me know  Start over-the-counter vitamin D 2000 units daily
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
## 2020-01-14 NOTE — Assessment & Plan Note (Signed)
-  Immunizations: reviewed, s/p covid vaccine x 2  - DRE normal today, check a PSA - [facility]: cscope 2010, Dr Shary Key W[facility] [place]); due, did not see GI last year due to Covid, plans to call Dr Benson Norway  for next cscope  -Labs reviewed : CMP, FLP, PSA, vitamin D -Diet and exercise discussed.

## 2020-01-16 ENCOUNTER — Encounter: Payer: Self-pay | Admitting: Internal Medicine

## 2020-01-16 DIAGNOSIS — E785 Hyperlipidemia, unspecified: Secondary | ICD-10-CM

## 2020-01-16 MED ORDER — VITAMIN D (ERGOCALCIFEROL) 1.25 MG (50000 UNIT) PO CAPS: 50000.0000 [IU] | ORAL_CAPSULE | ORAL | 0 refills | Status: DC

## 2020-01-16 MED FILL — VIT D2 1.25 MG (50,000 UNIT: 1.25 MG | 84 days supply | Qty: 12 | Fill #0

## 2020-01-16 NOTE — Addendum Note (Signed)
Addended byDamita Dunnings D on: 01/16/2020 02:21 PM   Modules accepted: Orders

## 2020-01-23 MED FILL — XIAFLEX 0.9 MG SOLR: 0.9 | 3 days supply | Qty: 2 | Fill #3

## 2020-01-24 MED FILL — GENVOYA TABLET: 150-150-200 | 30 days supply | Qty: 30 | Fill #2

## 2020-02-07 MED FILL — CITALOPRAM HBR 20 MG TABLET: 20 | 90 days supply | Qty: 90 | Fill #1

## 2020-02-11 DIAGNOSIS — N486 Induration penis plastica: Secondary | ICD-10-CM | POA: Diagnosis not present

## 2020-02-12 DIAGNOSIS — N486 Induration penis plastica: Secondary | ICD-10-CM | POA: Diagnosis not present

## 2020-02-21 MED FILL — GENVOYA TABLET: 150-150-200 | 30 days supply | Qty: 30 | Fill #3

## 2020-03-23 MED FILL — GENVOYA TABLET: 150-150-200 | 30 days supply | Qty: 30 | Fill #4

## 2020-04-20 ENCOUNTER — Ambulatory Visit: Payer: Self-pay

## 2020-04-20 ENCOUNTER — Other Ambulatory Visit: Payer: Self-pay

## 2020-04-20 ENCOUNTER — Encounter: Payer: Self-pay | Admitting: Family Medicine

## 2020-04-20 ENCOUNTER — Ambulatory Visit: Payer: 59 | Admitting: Family Medicine

## 2020-04-20 VITALS — BP 167/92 | HR 64 | Ht 68.0 in

## 2020-04-20 DIAGNOSIS — M7701 Medial epicondylitis, right elbow: Secondary | ICD-10-CM | POA: Diagnosis not present

## 2020-04-20 DIAGNOSIS — M25521 Pain in right elbow: Secondary | ICD-10-CM

## 2020-04-20 NOTE — Progress Notes (Signed)
Medication Samples have been provided to the patient.  Drug name: Rayos       Strength: 5mg         Qty: 2 boxes  LOT: [id number]  Exp.Date: 08/2020  Dosing instructions: Take 3 tablets by mouth as close to 10 pm.  The patient has been instructed regarding the correct time, dose, and frequency of taking this medication, including desired effects and most common side effects.   Sherrie George, [place] 3:34 PM 04/20/2020

## 2020-04-20 NOTE — Progress Notes (Signed)
Bruce Woods - 59 y.o. male MRN [id number]  Date of birth: 1961/03/11  SUBJECTIVE:  Including CC & ROS.  Chief Complaint  Patient presents with  . Elbow Pain    right    Bruce Woods is a 59 y.o. male that is related with right elbow pain.  The pain has been ongoing over the past few days.  Pain is severe and tender to the touch.  He felt the noticed a pop in the medial aspect of the elbow.  He has a history of cubital tunnel syndrome and had required surgery.  Denies any numbness or feelings similar to that previous occasion.   Review of Systems See HPI   HISTORY: Past Medical, Surgical, Social, and Family History Reviewed & Updated per EMR.   Pertinent Historical Findings include:  Past Medical History:  Diagnosis Date  . DEPRESSION   . GERD (gastroesophageal reflux disease)   . HIV DISEASE   . IBS (irritable bowel syndrome)   . Sarcoidosis 2000   question of  . Ulnar nerve compression, right     Past Surgical History:  Procedure Laterality Date  . LUNG SURGERY  2000   vats  . TONSILLECTOMY AND ADENOIDECTOMY    . ULNAR NERVE TRANSPOSITION Right 05/17/2018   Procedure: RIGHT ULNAR NERVE DECOMPRESSION/TRANSPOSITION;  Surgeon: Leanora Cover, MD;  Location: [facility];  Service: Orthopedics;  Laterality: Right;    Family History  Problem Relation Age of Onset  . Hyperlipidemia Mother   . Hypertension Mother   . Cancer Mother        glioblastoma  . Sudden death Neg Hx   . Heart attack Neg Hx   . Diabetes Neg Hx   . Colon cancer Neg Hx   . Prostate cancer Neg Hx     Social History   Socioeconomic History  . Marital status: Married    Spouse name: Not on file  . Number of children: 2  . Years of education: Not on file  . Highest education level: Not on file  Occupational History  . 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 Smoking status: Former Smoker    Packs/day: 1.00    Years: 2.00    Pack  years: 2.00    Types: Cigarettes    Quit date: 11/01/1983    Years since quitting: 36.4  . Smokeless tobacco: Never Used  Vaping Use  . Vaping Use: Never used  Substance and Sexual Activity  . Alcohol use: Yes    Alcohol/week: 1.0 standard drink    Types: 1 Standard drinks or equivalent per week    Comment: socially   . Drug use: No  . Sexual activity: Yes    Comment: declined condoms  Other Topics Concern  . Not on file  Social History Narrative   [name] ( married)   Social Determinants of Health   Financial Resource Strain:   . Difficulty of Paying Living Expenses:   Food Insecurity:   . Worried About Charity fundraiser in the Last Year:   . Arboriculturist in the Last Year:   Transportation Needs:   . Film/video editor (Medical):   Marland Kitchen Lack of Transportation (Non-Medical):   Physical Activity:   . Days of Exercise per Week:   . Minutes of Exercise per Session:   Stress:   . Feeling of Stress :   Social Connections:   .  Frequency of Communication with Friends and Family:   . Frequency of Social Gatherings with Friends and Family:   . Attends Religious Services:   . Active Member of Clubs or Organizations:   . Attends Archivist Meetings:   Marland Kitchen Marital Status:   Intimate Partner Violence:   . Fear of Current or Ex-Partner:   . Emotionally Abused:   Marland Kitchen Physically Abused:   . Sexually Abused:      PHYSICAL EXAM:  VS: BP (!) 167/92   Pulse 64   Ht 5\' 8"  (1.727 m)   BMI 29.06 kg/m  Physical Exam Gen: NAD, alert, cooperative with exam, well-appearing MSK:  Right elbow: Normal range of motion. Tenderness to palpation of the medial epicondyle and medial aspect of the elbow. No instability with valgus varus stress testing. Normal strength resistance with supination pronation. No signs of atrophy. Normal grip strength. Neurovascular intact  Limited ultrasound: Right elbow:  No effusion noted. No increased hyperemia over the medial  condyle. Appears at the relocated ulnar nerve has close proximity to the medial condyle.  There may be some swelling of the ulnar nerve at this location.  There it does not appear to be any recent change of its position with hypoechoic changes or anechoic changes of the surrounding tissue.   Summary: No significant structural changes appreciated.  Ultrasound and interpretation by Clearance Coots, MD      ASSESSMENT & PLAN:   Medial epicondylitis of elbow, right He has been lifting boxes recently which may have attributed to having irritation of the medial condyle.  It does appear that the ulnar nerve is in that vicinity but does not appear that any structural changes have occurred recently. -Counseled on home exercise therapy and supportive care. -Rayos samples. -Pennsaid samples.

## 2020-04-20 NOTE — Assessment & Plan Note (Signed)
He has been lifting boxes recently which may have attributed to having irritation of the medial condyle.  It does appear that the ulnar nerve is in that vicinity but does not appear that any structural changes have occurred recently. -Counseled on home exercise therapy and supportive care. -Rayos samples. -Pennsaid samples.

## 2020-04-20 NOTE — Patient Instructions (Signed)
Nice to meet you Please take the rayos as close to 10 pm as you can.  Please try the pennsaid  Please try the exercises   Please send me a message in MyChart with any questions or updates.  Please see me back in 4 weeks or sooner if needed.   --Dr. Raeford Razor

## 2020-04-20 NOTE — Progress Notes (Signed)
Medication Samples have been provided to the patient.  Drug name: Pennsaid       Strength: 2%        Qty: 2 boxes  LOT: [id number]  Exp.Date: 12/2020  Dosing instructions: use a pea size amount and rub gently.  The patient has been instructed regarding the correct time, dose, and frequency of taking this medication, including desired effects and most common side effects.   Sherrie George, MA 3:36 PM 04/20/2020

## 2020-04-21 MED FILL — GENVOYA TABLET: 150-150-200 | 30 days supply | Qty: 30 | Fill #0

## 2020-05-02 IMAGING — DX CHEST - 2 VIEW
2 series · 2 of 2 positions shown · non-contrast
Comparison: September 25, 2018

CLINICAL DATA: Chest pain and shortness of breath

EXAM:
CHEST - 2 VIEW

[chest lat]
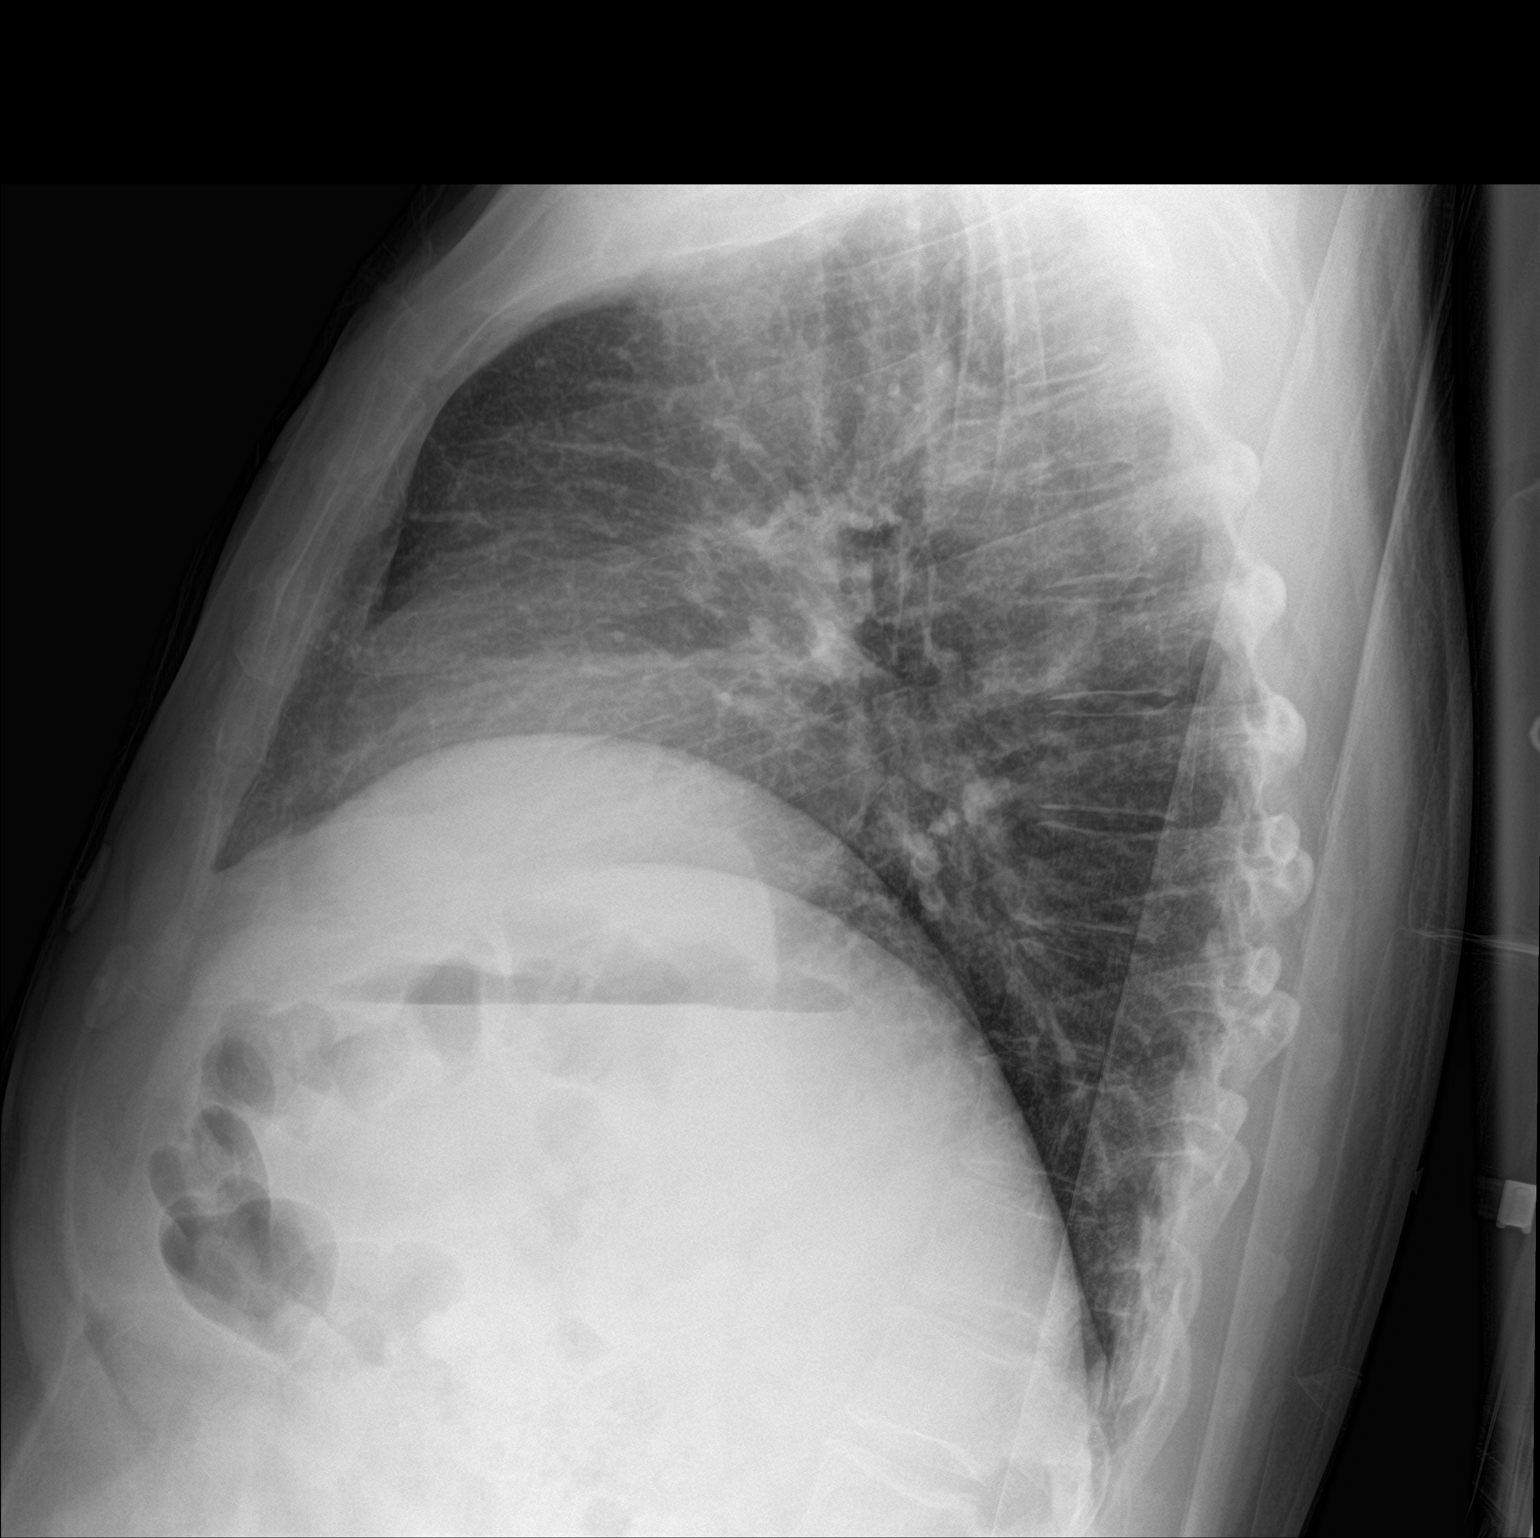

[chest ap strecther]
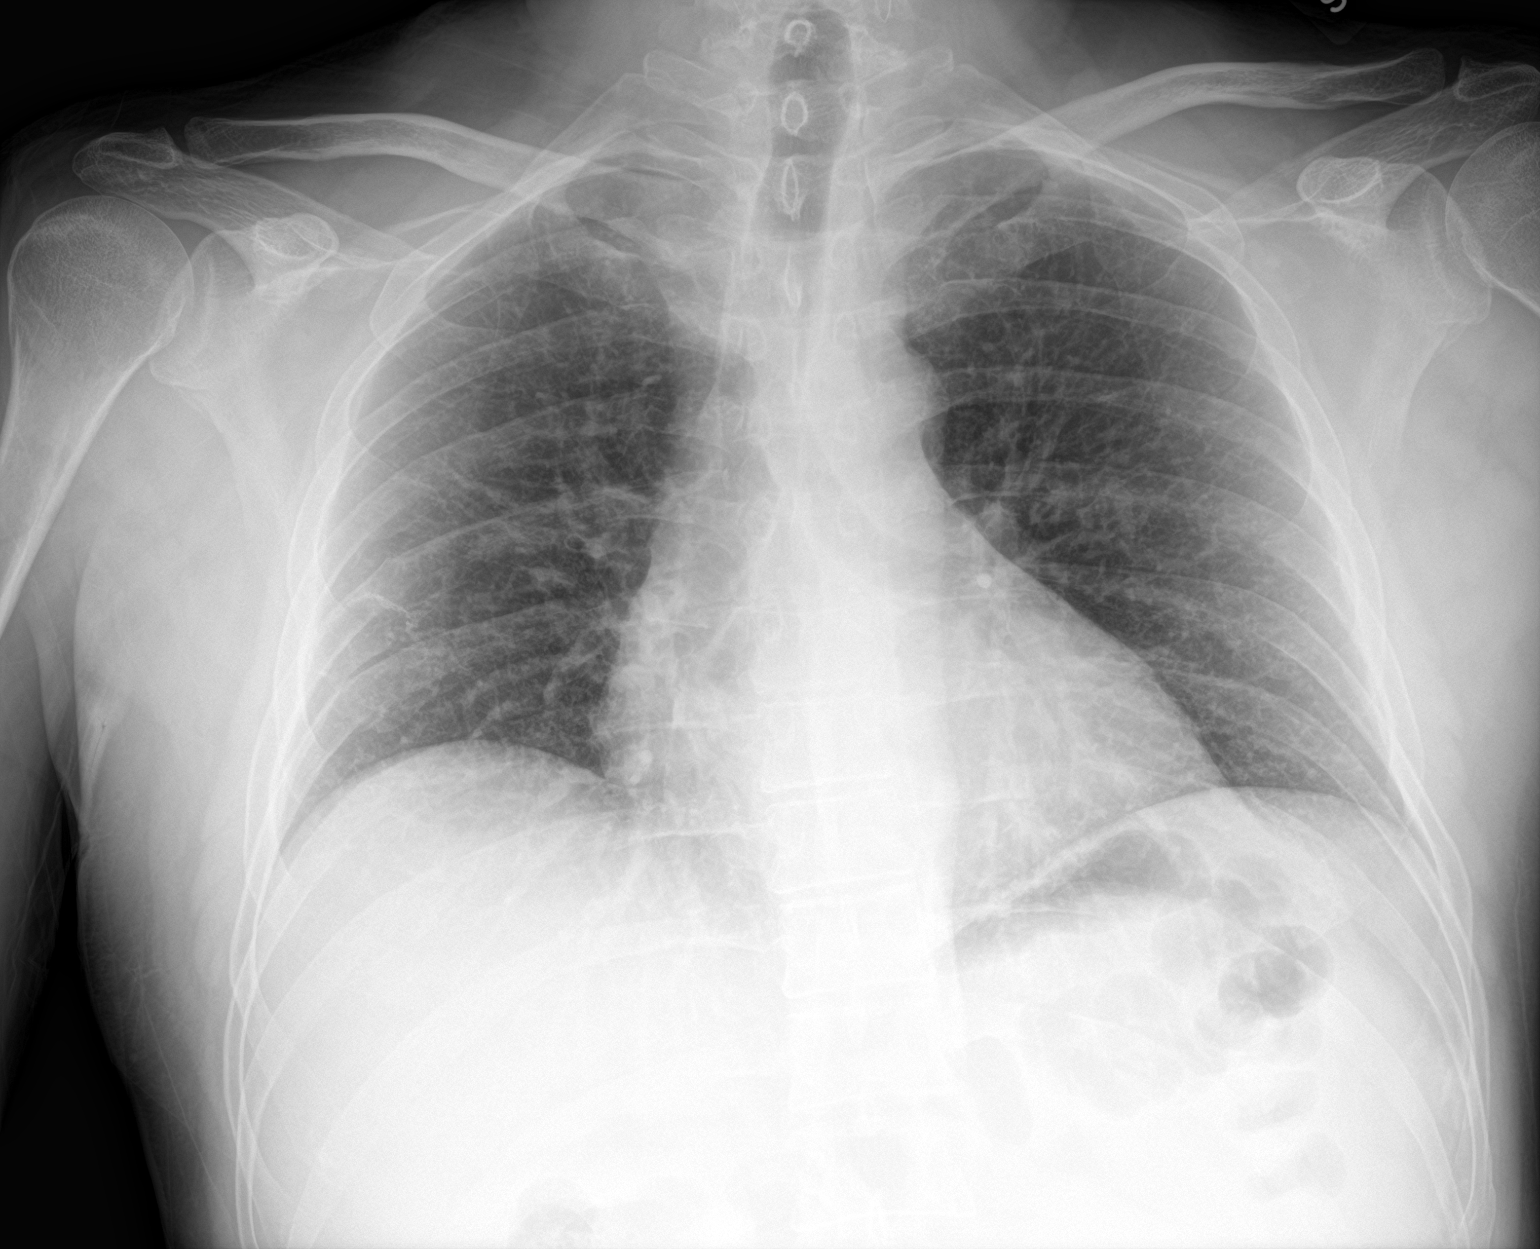

[2 of 2 positions shown; findings below may reference images not displayed]

FINDINGS: Postoperative change noted in the right mid lung. No edema or
consolidation. Heart size and pulmonary vascularity normal. No
adenopathy. No pneumothorax. No bone lesions.
IMPRESSION: Postoperative change in right mid lung. No edema or consolidation.
Stable cardiac silhouette.

## 2020-05-05 MED FILL — CITALOPRAM HBR 20 MG TABLET: 20 | 90 days supply | Qty: 90 | Fill #2

## 2020-05-07 ENCOUNTER — Encounter: Payer: Self-pay | Admitting: Family Medicine

## 2020-05-21 MED FILL — GENVOYA TABLET: 150-150-200 | 30 days supply | Qty: 30 | Fill #1

## 2020-05-24 ENCOUNTER — Encounter: Payer: Self-pay | Admitting: Internal Medicine

## 2020-05-25 ENCOUNTER — Other Ambulatory Visit: Payer: Self-pay | Admitting: Internal Medicine

## 2020-05-25 MED ORDER — SILDENAFIL CITRATE 20 MG PO TABS: 60.0000 mg | ORAL_TABLET | Freq: Every evening | ORAL | 3 refills | Status: DC | PRN

## 2020-05-25 MED FILL — SILDENAFIL CITRATE 20 MG TA: 20 | 8 days supply | Qty: 30 | Fill #0

## 2020-06-22 MED FILL — GENVOYA TABLET: 150-150-200 | 30 days supply | Qty: 30 | Fill #2

## 2020-06-29 ENCOUNTER — Ambulatory Visit: Payer: 59 | Attending: Internal Medicine

## 2020-06-29 DIAGNOSIS — Z23 Encounter for immunization: Secondary | ICD-10-CM

## 2020-06-30 MED FILL — PFIZER-BIONTECH COVID-19 VA: 30 | 90 days supply | Qty: 2 | Fill #0

## 2020-07-14 MED FILL — FLUARIX QUADRIVALENT 0.5 ML: 0.5 | 1 days supply | Qty: 1 | Fill #0

## 2020-07-22 MED FILL — GENVOYA TABLET: 150-150-200 | 30 days supply | Qty: 30 | Fill #3

## 2020-08-03 MED FILL — CITALOPRAM HBR 20 MG TABLET: 20 | 90 days supply | Qty: 90 | Fill #3

## 2020-08-13 DIAGNOSIS — H524 Presbyopia: Secondary | ICD-10-CM | POA: Diagnosis not present

## 2020-08-13 DIAGNOSIS — H52223 Regular astigmatism, bilateral: Secondary | ICD-10-CM | POA: Diagnosis not present

## 2020-08-13 DIAGNOSIS — H5203 Hypermetropia, bilateral: Secondary | ICD-10-CM | POA: Diagnosis not present

## 2020-08-18 MED FILL — GENVOYA TABLET: 150-150-200 | 30 days supply | Qty: 30 | Fill #4

## 2020-09-18 MED FILL — GENVOYA TABLET: 150-150-200 | 30 days supply | Qty: 30 | Fill #5

## 2020-09-29 ENCOUNTER — Other Ambulatory Visit: Payer: Self-pay

## 2020-09-29 ENCOUNTER — Other Ambulatory Visit: Payer: 59

## 2020-09-29 DIAGNOSIS — B20 Human immunodeficiency virus [HIV] disease: Secondary | ICD-10-CM | POA: Diagnosis not present

## 2020-09-30 LAB — T-HELPER CELL (CD4) - (RCID CLINIC ONLY)
CD4 % Helper T Cell: 22 % — ABNORMAL LOW (ref 33–65)
CD4 T Cell Abs: 435 /uL (ref 400–1790)

## 2020-10-02 LAB — LIPID PANEL
Cholesterol: 188 mg/dL (ref <200)
HDL: 37 mg/dL — ABNORMAL LOW (ref >=40)
LDL Cholesterol (Calc): 124 mg/dL (calc) — ABNORMAL HIGH
Non-HDL Cholesterol (Calc): 151 mg/dL (calc) — ABNORMAL HIGH (ref <130)
Total CHOL/HDL Ratio: 5.1 (calc) — ABNORMAL HIGH (ref <5.0)
Triglycerides: 155 mg/dL — ABNORMAL HIGH (ref <150)

## 2020-10-02 LAB — COMPREHENSIVE METABOLIC PANEL
AG Ratio: 1.5 (calc) (ref 1.0–2.5)
ALT: 17 U/L (ref 9–46)
AST: 17 U/L (ref 10–35)
Albumin: 4.3 g/dL (ref 3.6–5.1)
Alkaline phosphatase (APISO): 56 U/L (ref 35–144)
BUN/Creatinine Ratio: 12 (calc) (ref 6–22)
BUN: 16 mg/dL (ref 7–25)
CO2: 29 mmol/L (ref 20–32)
Calcium: 9.3 mg/dL (ref 8.6–10.3)
Chloride: 104 mmol/L (ref 98–110)
Creat: 1.34 mg/dL — ABNORMAL HIGH (ref 0.70–1.25)
Globulin: 2.8 g/dL (calc) (ref 1.9–3.7)
Glucose, Bld: 91 mg/dL (ref 65–99)
Potassium: 4.1 mmol/L (ref 3.5–5.3)
Sodium: 142 mmol/L (ref 135–146)
Total Bilirubin: 0.7 mg/dL (ref 0.2–1.2)
Total Protein: 7.1 g/dL (ref 6.1–8.1)

## 2020-10-02 LAB — HIV-1 RNA QUANT-NO REFLEX-BLD
HIV 1 RNA Quant: 32 Copies/mL — ABNORMAL HIGH
HIV-1 RNA Quant, Log: 1.5 Log cps/mL — ABNORMAL HIGH

## 2020-10-02 LAB — CBC
HCT: 44.9 % (ref 38.5–50.0)
Hemoglobin: 16.2 g/dL (ref 13.2–17.1)
MCH: 33.5 pg — ABNORMAL HIGH (ref 27.0–33.0)
MCHC: 36.1 g/dL — ABNORMAL HIGH (ref 32.0–36.0)
MCV: 92.8 fL (ref 80.0–100.0)
MPV: 11.1 fL (ref 7.5–12.5)
Platelets: 170 10*3/uL (ref 140–400)
RBC: 4.84 10*6/uL (ref 4.20–5.80)
RDW: 13.2 % (ref 11.0–15.0)
WBC: 5.2 10*3/uL (ref 3.8–10.8)

## 2020-10-02 LAB — RPR: RPR Ser Ql: NONREACTIVE

## 2020-10-14 ENCOUNTER — Ambulatory Visit: Payer: 59 | Admitting: Internal Medicine

## 2020-10-14 ENCOUNTER — Other Ambulatory Visit: Payer: Self-pay | Admitting: Internal Medicine

## 2020-10-14 ENCOUNTER — Other Ambulatory Visit: Payer: Self-pay

## 2020-10-14 ENCOUNTER — Encounter: Payer: Self-pay | Admitting: Internal Medicine

## 2020-10-14 DIAGNOSIS — F419 Anxiety disorder, unspecified: Secondary | ICD-10-CM | POA: Insufficient documentation

## 2020-10-14 DIAGNOSIS — R030 Elevated blood-pressure reading, without diagnosis of hypertension: Secondary | ICD-10-CM | POA: Diagnosis not present

## 2020-10-14 DIAGNOSIS — B20 Human immunodeficiency virus [HIV] disease: Secondary | ICD-10-CM | POA: Diagnosis not present

## 2020-10-14 MED ORDER — BICTEGRAVIR-EMTRICITAB-TENOFOV 50-200-25 MG PO TABS: 1.0000 | ORAL_TABLET | Freq: Every day | ORAL | 3 refills | Status: DC

## 2020-10-14 MED FILL — BIKTARVY 50-200-25 MG TABS: 50-200-25 | 30 days supply | Qty: 30 | Fill #0

## 2020-10-14 NOTE — Assessment & Plan Note (Signed)
His blood pressures have been consistently elevated over the past several years and now his creatinine is bumped up to 1.35.  I suggested that he keep a blood pressure log to review with Dr. Larose Kells, his PCP.
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
## 2020-10-14 NOTE — Assessment & Plan Note (Signed)
He has had intermittent low level viral activation but overall his infection remains under very good, long-term control.  He occasionally has some difficulty arranging his schedule so he can take his Genvoya with food.  I will change him to [facility] which he can take with or without food.  He is already received his Covid booster and influenza vaccine.  He will follow-up here in 1 year.

## 2020-10-26 ENCOUNTER — Ambulatory Visit: Payer: 59 | Admitting: Internal Medicine

## 2020-10-26 ENCOUNTER — Encounter: Payer: Self-pay | Admitting: Internal Medicine

## 2020-10-26 ENCOUNTER — Other Ambulatory Visit: Payer: Self-pay | Admitting: Internal Medicine

## 2020-10-26 ENCOUNTER — Other Ambulatory Visit: Payer: Self-pay

## 2020-10-26 VITALS — BP 153/86 | HR 59 | Temp 98.0°F | Ht 68.0 in | Wt 192.0 lb

## 2020-10-26 DIAGNOSIS — I10 Essential (primary) hypertension: Secondary | ICD-10-CM

## 2020-10-26 DIAGNOSIS — G4700 Insomnia, unspecified: Secondary | ICD-10-CM | POA: Diagnosis not present

## 2020-10-26 DIAGNOSIS — F419 Anxiety disorder, unspecified: Secondary | ICD-10-CM

## 2020-10-26 MED ORDER — AMLODIPINE BESYLATE 5 MG PO TABS
5.0000 mg | ORAL_TABLET | Freq: Every day | ORAL | 3 refills | Status: DC
Start: 2020-10-26 — End: 2020-10-26

## 2020-10-26 MED ORDER — ZOLPIDEM TARTRATE 10 MG PO TABS: 10.0000 mg | ORAL_TABLET | Freq: Every evening | ORAL | 1 refills | Status: DC | PRN

## 2020-10-26 MED ORDER — METHOCARBAMOL 500 MG PO TABS: 500.0000 mg | ORAL_TABLET | Freq: Two times a day (BID) | ORAL | 0 refills | Status: DC | PRN

## 2020-10-26 MED FILL — METHOCARBAMOL 500 MG TABS: 500 | 30 days supply | Qty: 60 | Fill #0

## 2020-10-26 MED FILL — ZOLPIDEM TARTRATE 10 MG TAB: 10 | 30 days supply | Qty: 30 | Fill #0

## 2020-10-26 MED FILL — AMLODIPINE BESYLATE 5 MG TA: 5 | 30 days supply | Qty: 30 | Fill #0

## 2020-10-26 NOTE — Progress Notes (Signed)
Subjective:    Patient ID: Bruce Woods, male    DOB: October 03, 1961, 59 y.o.   MRN: [id number]  DOS:  10/26/2020 Type of visit - description: Acute His main concern is high blood pressure.  Has been tracking his BPs for the last couple of months and he has been elevated multiple times however readings are mixed with also normal numbers. Typically when the BP is elevated he has a headache and occasionally chest pain. The chest pain is described as mild, in the middle of the chest, no radiation, no associated with sweats or nausea. If the BP is normal he has no chest pain.  No exertional chest pain. No lower extremity edema, no palpitations  He also admits to stress, he is a Engineer, building services. Has trouble sleeping, not getting more than 4 hours a night despite taking melatonin.   BP Readings from Last 3 Encounters:  10/26/20 (!) 153/86  10/14/20 (!) 183/96  04/20/20 (!) 167/92       Review of Systems See above   Past Medical History:  Diagnosis Date  . DEPRESSION   . GERD (gastroesophageal reflux disease)   . HIV DISEASE   . IBS (irritable bowel syndrome)   . Sarcoidosis 2000   question of  . Ulnar nerve compression, right     Past Surgical History:  Procedure Laterality Date  . LUNG SURGERY  2000   vats  . TONSILLECTOMY AND ADENOIDECTOMY    . ULNAR NERVE TRANSPOSITION Right 05/17/2018   Procedure: RIGHT ULNAR NERVE DECOMPRESSION/TRANSPOSITION;  Surgeon: Betha Loa, MD;  Location: [facility];  Service: Orthopedics;  Laterality: Right;    Allergies as of 10/26/2020      Reactions   Morphine Other (See Comments)   Resp distress, rash, tachycardia   Doxycycline Nausea And Vomiting   Celebrex [celecoxib] Other (See Comments)   Elevated LFT's   Wellbutrin [bupropion] Hives      Medication List       Accurate as of October 26, 2020 11:59 PM. If you have any questions, ask your nurse or doctor.        STOP taking these medications   Cholecalciferol 50  MCG (2000 UT) Caps Stopped by: Willow Ora, MD   Vitamin D (Ergocalciferol) 1.25 MG (50000 UNIT) Caps capsule Commonly known as: DRISDOL Stopped by: Willow Ora, MD     TAKE these medications   amLODipine 5 MG tablet Commonly known as: NORVASC Take 1 tablet (5 mg total) by mouth daily. Started by: Willow Ora, MD   bictegravir-emtricitabine-tenofovir AF 50-200-25 MG Tabs tablet Commonly known as: BIKTARVY Take 1 tablet by mouth daily.   citalopram 20 MG tablet Commonly known as: CELEXA Take 1 tablet (20 mg total) by mouth daily.   famotidine 20 MG tablet Commonly known as: PEPCID Take 20 mg by mouth 2 (two) times daily.   methocarbamol 500 MG tablet Commonly known as: ROBAXIN Take 1 tablet (500 mg total) by mouth 2 (two) times daily as needed. What changed: when to take this Changed by: Willow Ora, MD   sildenafil 20 MG tablet Commonly known as: REVATIO Take 3-4 tablets (60-80 mg total) by mouth at bedtime as needed.   zolpidem 10 MG tablet Commonly known as: AMBIEN Take 1 tablet (10 mg total) by mouth at bedtime as needed for sleep. Started by: Willow Ora, MD          Objective:   Physical Exam BP (!) 153/86 (BP Location: Right Arm, Patient Position:  Sitting, Cuff Size: Large)   Pulse (!) 59   Temp 98 F (36.7 C) (Oral)   Ht 5\' 8"  (1.727 m)   Wt 192 lb (87.1 kg)   SpO2 96%   BMI 29.19 kg/m  General:   Well developed, NAD, BMI noted. HEENT:  Normocephalic . Face symmetric, atraumatic Lungs:  CTA B Normal respiratory effort, no intercostal retractions, no accessory muscle use. Heart: RRR,  no murmur.  Lower extremities: no pretibial edema bilaterally  Skin: Not pale. Not jaundice Neurologic:  alert & oriented X3.  Speech normal, gait appropriate for age and unassisted Psych--  Cognition and judgment appear intact.  Cooperative with normal attention span and concentration.  Behavior appropriate. No anxious or depressed appearing.      Assessment      Assessment: HIV + HTN: DX 10/26/2020 Depression ED Vitamin D deficiency ?sarcoidosis dx 2000 CP, presyncope, palpitation: 2018>> saw cardiology and pulmonary. CXR, ECHO, GTX, cardiac event monitor negative.   Peyronie's dz , penis  PLAN HTN: For the last 2 months BPs has been elevated on and off, as high as 180/114 but also have readings as low as 129/70. He is symptomatic when the BPs is elevated: Headache, chest pain (noting a neg cardiac w/u 2018) No recent NSAIDs, he is active walking the dog.  Admits to not eating healthy. Last BMP satisfactory.  About 2 weeks ago his HIV medications were changed but otherwise not taking new medications. EKG today: Sinus bradycardia Plan: Watch salt intake, amlodipine 5 mg, call in 3 weeks, notify me if chest pain severe or crescendo. Anxiety, insomnia: Admits to increasing stress lately, he is a 2019.  He thinks that is playing a role in his elevated BP and could certainly be the case.   For insomnia we will continue melatonin, add Ambien. For anxiety:   counseling is a very good option, we agreed to continue the same dose of citalopram .if better sleep is not decreasing his anxiety he will let me know.  Increase citalopram?. RTC for CPX by March 2022, sooner if needed.   This visit occurred during the SARS-CoV-2 public health emergency.  Safety protocols were in place, including screening questions prior to the visit, additional usage of staff PPE, and extensive cleaning of exam room while observing appropriate contact time as indicated for disinfecting solutions.

## 2020-10-26 NOTE — Patient Instructions (Addendum)
Start amlodipine 5 mg 1 tablet every night  Start Ambien 10 mg: 1 tablet at bedtime if needed  Check the  blood pressure 2 or 3 times a week BP GOAL is between 110/65 and  135/85. If it is consistently higher or lower, let me know   If you are not feeling better please let me know  GO TO THE FRONT DESK, PLEASE SCHEDULE YOUR APPOINTMENTS Come back for a physical exam by March 2022.  Sooner if needed.

## 2020-10-27 DIAGNOSIS — I10 Essential (primary) hypertension: Secondary | ICD-10-CM | POA: Insufficient documentation

## 2020-10-27 NOTE — Assessment & Plan Note (Signed)
HTN: For the last 2 months BPs has been elevated on and off, as high as 180/114 but also have readings as low as 129/70. He is symptomatic when the BPs is elevated: Headache, chest pain (noting a neg cardiac w/u 2018) No recent NSAIDs, he is active walking the dog.  Admits to not eating healthy. Last BMP satisfactory.  About 2 weeks ago his HIV medications were changed but otherwise not taking new medications. EKG today: Sinus bradycardia Plan: Watch salt intake, amlodipine 5 mg, call in 3 weeks, notify me if chest pain severe or crescendo. Anxiety, insomnia: Admits to increasing stress lately, he is a Engineer, building services.  He thinks that is playing a role in his elevated BP and could certainly be the case.   For insomnia we will continue melatonin, add Ambien. For anxiety:   counseling is a very good option, we agreed to continue the same dose of citalopram .if better sleep is not decreasing his anxiety he will let me know.  Increase citalopram?. RTC for CPX by March 2022, sooner if needed.

## 2020-11-04 ENCOUNTER — Other Ambulatory Visit: Payer: Self-pay | Admitting: Internal Medicine

## 2020-11-04 DIAGNOSIS — F3342 Major depressive disorder, recurrent, in full remission: Secondary | ICD-10-CM

## 2020-11-04 MED FILL — CITALOPRAM HBR 20 MG TABLET: 20 | 90 days supply | Qty: 90 | Fill #0

## 2020-11-10 MED FILL — BIKTARVY 50-200-25 MG TABS: 50-200-25 | 30 days supply | Qty: 30 | Fill #1

## 2020-11-19 MED FILL — AMLODIPINE BESYLATE 5 MG TA: 5 | 30 days supply | Qty: 30 | Fill #1

## 2020-12-14 MED FILL — BIKTARVY 50-200-25 MG TABS: 50-200-25 | 30 days supply | Qty: 30 | Fill #2

## 2020-12-14 MED FILL — AMLODIPINE BESYLATE 5 MG TA: 5 | 60 days supply | Qty: 60 | Fill #2

## 2020-12-14 MED FILL — SILDENAFIL CITRATE 20 MG TA: 20 | 8 days supply | Qty: 30 | Fill #1

## 2020-12-14 MED FILL — ZOLPIDEM TARTRATE 10 MG TAB: 10 | 30 days supply | Qty: 30 | Fill #1

## 2021-01-14 ENCOUNTER — Ambulatory Visit (INDEPENDENT_AMBULATORY_CARE_PROVIDER_SITE_OTHER): Payer: 59 | Admitting: Internal Medicine

## 2021-01-14 ENCOUNTER — Encounter: Payer: Self-pay | Admitting: Internal Medicine

## 2021-01-14 ENCOUNTER — Other Ambulatory Visit: Payer: Self-pay

## 2021-01-14 VITALS — BP 122/70 | HR 57 | Temp 98.0°F | Resp 16 | Ht 68.0 in | Wt 191.5 lb

## 2021-01-14 DIAGNOSIS — Z0000 Encounter for general adult medical examination without abnormal findings: Secondary | ICD-10-CM

## 2021-01-14 DIAGNOSIS — I10 Essential (primary) hypertension: Secondary | ICD-10-CM

## 2021-01-14 DIAGNOSIS — E785 Hyperlipidemia, unspecified: Secondary | ICD-10-CM

## 2021-01-14 DIAGNOSIS — E559 Vitamin D deficiency, unspecified: Secondary | ICD-10-CM

## 2021-01-14 LAB — TSH: TSH: 2.59 u[IU]/mL (ref 0.35–4.50)

## 2021-01-14 LAB — LIPID PANEL
Cholesterol: 147 mg/dL (ref 0–200)
HDL: 32.2 mg/dL — ABNORMAL LOW (ref >39.00)
LDL Cholesterol: 93 mg/dL (ref 0–99)
NonHDL: 114.7
Total CHOL/HDL Ratio: 5
Triglycerides: 107 mg/dL (ref 0.0–149.0)
VLDL: 21.4 mg/dL (ref 0.0–40.0)

## 2021-01-14 LAB — BASIC METABOLIC PANEL
BUN: 17 mg/dL (ref 6–23)
CO2: 29 mEq/L (ref 19–32)
Calcium: 9.1 mg/dL (ref 8.4–10.5)
Chloride: 102 mEq/L (ref 96–112)
Creatinine, Ser: 1.16 mg/dL (ref 0.40–1.50)
GFR: 67.77 mL/min (ref >60.00)
Glucose, Bld: 89 mg/dL (ref 70–99)
Potassium: 3.9 mEq/L (ref 3.5–5.1)
Sodium: 139 mEq/L (ref 135–145)

## 2021-01-14 LAB — VITAMIN D 25 HYDROXY (VIT D DEFICIENCY, FRACTURES): VITD: 25.07 ng/mL — ABNORMAL LOW (ref 30.00–100.00)

## 2021-01-14 NOTE — Progress Notes (Signed)
Subjective:    Patient ID: Bruce Woods, male    DOB: 09-22-1961, 60 y.o.   MRN: [id number]  DOS:  01/14/2021 Type of visit - description: Here for CPX  Since the last office visit, he started amlodipine and Ambien. Feeling great, has no concerns, no side effects.  Review of Systems  Other than above, a 14 point review of systems is negative     Past Medical History:  Diagnosis Date   DEPRESSION    GERD (gastroesophageal reflux disease)    HIV DISEASE    IBS (irritable bowel syndrome)    Sarcoidosis 2000   question of   Ulnar nerve compression, right     Past Surgical History:  Procedure Laterality Date   LUNG SURGERY  2000   vats   TONSILLECTOMY AND ADENOIDECTOMY     ULNAR NERVE TRANSPOSITION Right 05/17/2018   Procedure: RIGHT ULNAR NERVE DECOMPRESSION/TRANSPOSITION;  Surgeon: Leanora Cover, MD;  Location: [facility];  Service: Orthopedics;  Laterality: Right;    Allergies as of 01/14/2021      Reactions   Morphine Other (See Comments)   Resp distress, rash, tachycardia   Doxycycline Nausea And Vomiting   Celebrex [celecoxib] Other (See Comments)   Elevated LFT's   Wellbutrin [bupropion] Hives      Medication List       Accurate as of January 14, 2021 11:59 PM. If you have any questions, ask your nurse or doctor.        amLODipine 5 MG tablet Commonly known as: NORVASC Take 1 tablet (5 mg total) by mouth daily.   bictegravir-emtricitabine-tenofovir AF 50-200-25 MG Tabs tablet Commonly known as: BIKTARVY Take 1 tablet by mouth daily.   citalopram 20 MG tablet Commonly known as: CELEXA TAKE 1 TABLET (20 MG TOTAL) BY MOUTH DAILY.   famotidine 20 MG tablet Commonly known as: PEPCID Take 20 mg by mouth 2 (two) times daily.   methocarbamol 500 MG tablet Commonly known as: ROBAXIN Take 1 tablet (500 mg total) by mouth 2 (two) times daily as needed.   sildenafil 20 MG tablet Commonly known as: REVATIO Take 3-4 tablets (60-80  mg total) by mouth at bedtime as needed.   zolpidem 10 MG tablet Commonly known as: AMBIEN Take 1 tablet (10 mg total) by mouth at bedtime as needed for sleep.          Objective:   Physical Exam BP 122/70 (BP Location: Left Arm, Patient Position: Sitting, Cuff Size: Normal)    Pulse (!) 57    Temp 98 F (36.7 C) (Oral)    Resp 16    Ht 5\' 8"  (1.727 m)    Wt 191 lb 8 oz (86.9 kg)    SpO2 98%    BMI 29.12 kg/m  General: Well developed, NAD, BMI noted Neck: No  thyromegaly  HEENT:  Normocephalic . Face symmetric, atraumatic Lungs:  CTA B Normal respiratory effort, no intercostal retractions, no accessory muscle use. Heart: RRR,  no murmur.  Abdomen:  Not distended, soft, non-tender. No rebound or rigidity.   Lower extremities: no pretibial edema bilaterally  Skin: Exposed areas without rash. Not pale. Not jaundice Neurologic:  alert & oriented X3.  Speech normal, gait appropriate for age and unassisted Strength symmetric and appropriate for age.  Psych: Cognition and judgment appear intact.  Cooperative with normal attention span and concentration.  Behavior appropriate. No anxious or depressed appearing.     Assessment     Assessment: HIV +  HTN: DX 10/26/2020 Depression ED Vitamin D deficiency ?sarcoidosis dx 2000 CP, presyncope, palpitation: 2018>> saw cardiology and pulmonary. CXR, ECHO, GTX, cardiac event monitor negative.   Peyronie's dz , penis MSK: h/o hip bursitis, sees Dr Alton Revere Here for CPX + HIV: Per Dr. Megan Salon, I sent him a message, is [name] candidate for Evusheld? HTN: Started amlodipine, doing great, no edema, excellent ambulatory BPs.  No change Depression, insomnia: On citalopram, started Ambien with great results, RF as needed Vitamin D deficiency: On OTCs 3 times a week, check vitamin D. RTC 1 year     This visit occurred during the SARS-CoV-2 public health emergency.  Safety protocols were in place, including screening  questions prior to the visit, additional usage of staff PPE, and extensive cleaning of exam room while observing appropriate contact time as indicated for disinfecting solutions.

## 2021-01-14 NOTE — Patient Instructions (Addendum)
Continue checking your blood pressures regularly BP GOAL is between 110/65 and  135/85. Watch your  diet carefully GO TO THE LAB : Get the blood work   [place], [place] Come back for a physical exam in 1 year.  Sooner if needed      Advance Directive  Advance directives are legal documents that allow you to make decisions about your health care and medical treatment in case you become unable to communicate for yourself. Advance directives let your wishes be known to family, friends, and health care providers. Discussing and writing advance directives should happen over time rather than all at once. Advance directives can be changed and updated at any time. There are different types of advance directives, such as:  Medical power of attorney.  Living will.  Do not resuscitate (DNR) order or do not attempt resuscitation (DNAR) order. Health care proxy and medical power of attorney A health care proxy is also called a health care agent. This person is appointed to make medical decisions for you when you are unable to make decisions for yourself. Generally, people ask a trusted friend or family member to act as their proxy and represent their preferences. Make sure you have an agreement with your trusted person to act as your proxy. A proxy may have to make a medical decision on your behalf if your wishes are not known. A medical power of attorney, also called a durable power of attorney for health care, is a legal document that names your health care proxy. Depending on the laws in your state, the document may need to be:  Signed.  Notarized.  Dated.  Copied.  Witnessed.  Incorporated into your medical record. You may also want to appoint a trusted person to manage your money in the event you are unable to do so. This is called a durable power of attorney for finances. It is a separate legal document from the durable power of attorney for health  care. You may choose your health care proxy or someone different to act as your agent in money matters. If you do not appoint a proxy, or there is a concern that the proxy is not acting in your best interest, a court may appoint a guardian to act on your behalf. Living will A living will is a set of instructions that state your wishes about medical care when you cannot express them yourself. Health care providers should keep a copy of your living will in your medical record. You may want to give a copy to family members or friends. To alert caregivers in case of an emergency, you can place a card in your wallet to let them know that you have a living will and where they can find it. A living will is used if you become:  Terminally ill.  Disabled.  Unable to communicate or make decisions. The following decisions should be included in your living will:  To use or not to use life support equipment, such as dialysis machines and breathing machines (ventilators).  Whether you want a DNR or DNAR order. This tells health care providers not to use cardiopulmonary resuscitation (CPR) if breathing or heartbeat stops.  To use or not to use tube feeding.  To be given or not to be given food and fluids.  Whether you want comfort (palliative) care when the goal becomes comfort rather than a cure.  Whether you want to donate your organs and tissues. A  living will does not give instructions for distributing your money and property if you should pass away. DNR or DNAR A DNR or DNAR order is a request not to have CPR in the event that your heart stops beating or you stop breathing. If a DNR or DNAR order has not been made and shared, a health care provider will try to help any patient whose heart has stopped or who has stopped breathing. If you plan to have surgery, talk with your health care provider about how your DNR or DNAR order will be followed if problems occur. What if I do not have an advance  directive? Some states assign family decision makers to act on your behalf if you do not have an advance directive. Each state has its own laws about advance directives. You may want to check with your health care provider, attorney, or state representative about the laws in your state. Summary  Advance directives are legal documents that allow you to make decisions about your health care and medical treatment in case you become unable to communicate for yourself.  The process of discussing and writing advance directives should happen over time. You can change and update advance directives at any time.  Advance directives may include a medical power of attorney, a living will, and a DNR or DNAR order. This information is not intended to replace advice given to you by your health care provider. Make sure you discuss any questions you have with your health care provider. Document Revised: 07/21/2020 Document Reviewed: 07/21/2020 Elsevier Patient Education  2021 [employer].

## 2021-01-15 ENCOUNTER — Encounter: Payer: Self-pay | Admitting: Internal Medicine

## 2021-01-15 NOTE — Assessment & Plan Note (Signed)
-  Immunizations: reviewed: s/p covid vaccine x 3 , shingrix d/w pt  -Prostate cancer screening: PSA 0.8 on March 2021, reassess next year. - VIF:BPPHKF 2010, Dr Shary Key WNL [place]); due for CCS, has not been able to set up a colonoscopy and he doubts he will be able to proceed in the near future, thus  2 other options discussed.  Elected I fob. -Labsreviewed, will get: BMP, FLP, TSH, vitamin D -Diet and exercise discussed.

## 2021-01-15 NOTE — Assessment & Plan Note (Signed)
Here for CPX + HIV: Per Dr. Megan Salon, I sent him a message, is Eduardo candidate for Evusheld? HTN: Started amlodipine, doing great, no edema, excellent ambulatory BPs.  No change Depression, insomnia: On citalopram, started Ambien with great results, RF as needed Vitamin D deficiency: On OTCs 3 times a week, check vitamin D. RTC 1 year

## 2021-01-18 ENCOUNTER — Other Ambulatory Visit: Payer: Self-pay | Admitting: Internal Medicine

## 2021-01-18 MED ORDER — VITAMIN D (ERGOCALCIFEROL) 1.25 MG (50000 UNIT) PO CAPS: 50000.0000 [IU] | ORAL_CAPSULE | ORAL | 0 refills | Status: DC

## 2021-01-18 MED FILL — VIT D2 1.25 MG (50,000 UNIT: 1.25 MG | 84 days supply | Qty: 12 | Fill #0

## 2021-01-18 NOTE — Addendum Note (Signed)
Addended byDamita Dunnings D on: 01/18/2021 07:47 AM   Modules accepted: Orders

## 2021-01-26 ENCOUNTER — Encounter: Payer: Self-pay | Admitting: Internal Medicine

## 2021-01-28 ENCOUNTER — Other Ambulatory Visit (HOSPITAL_COMMUNITY): Payer: Self-pay

## 2021-02-03 ENCOUNTER — Other Ambulatory Visit (HOSPITAL_BASED_OUTPATIENT_CLINIC_OR_DEPARTMENT_OTHER): Payer: Self-pay

## 2021-02-03 MED FILL — Citalopram Hydrobromide Tab 20 MG (Base Equiv): ORAL | 90 days supply | Qty: 90 | Fill #0 | Status: AC

## 2021-02-04 ENCOUNTER — Other Ambulatory Visit (HOSPITAL_COMMUNITY): Payer: Self-pay

## 2021-02-04 MED FILL — Bictegravir-Emtricitabine-Tenofovir AF Tab 50-200-25 MG: ORAL | 30 days supply | Qty: 30 | Fill #0 | Status: AC

## 2021-02-08 ENCOUNTER — Other Ambulatory Visit (HOSPITAL_COMMUNITY): Payer: Self-pay

## 2021-02-15 ENCOUNTER — Other Ambulatory Visit: Payer: Self-pay | Admitting: Internal Medicine

## 2021-02-15 ENCOUNTER — Other Ambulatory Visit (HOSPITAL_BASED_OUTPATIENT_CLINIC_OR_DEPARTMENT_OTHER): Payer: Self-pay

## 2021-02-15 MED ORDER — AMLODIPINE BESYLATE 5 MG PO TABS
5.0000 mg | ORAL_TABLET | Freq: Every day | ORAL | 1 refills | Status: DC
  Filled 2021-02-15: qty 90, 90d supply, fill #0
  Filled 2021-05-19: qty 90, 90d supply, fill #1

## 2021-03-12 ENCOUNTER — Other Ambulatory Visit (HOSPITAL_COMMUNITY): Payer: Self-pay

## 2021-03-12 MED FILL — Bictegravir-Emtricitabine-Tenofovir AF Tab 50-200-25 MG: ORAL | 30 days supply | Qty: 30 | Fill #1 | Status: AC

## 2021-03-23 ENCOUNTER — Encounter: Payer: Self-pay | Admitting: Internal Medicine

## 2021-03-24 ENCOUNTER — Other Ambulatory Visit: Payer: Self-pay

## 2021-03-24 DIAGNOSIS — N529 Male erectile dysfunction, unspecified: Secondary | ICD-10-CM

## 2021-03-30 ENCOUNTER — Other Ambulatory Visit: Payer: Self-pay

## 2021-03-30 ENCOUNTER — Other Ambulatory Visit (INDEPENDENT_AMBULATORY_CARE_PROVIDER_SITE_OTHER): Payer: 59

## 2021-03-30 DIAGNOSIS — E559 Vitamin D deficiency, unspecified: Secondary | ICD-10-CM | POA: Diagnosis not present

## 2021-03-30 DIAGNOSIS — N529 Male erectile dysfunction, unspecified: Secondary | ICD-10-CM

## 2021-03-30 DIAGNOSIS — E291 Testicular hypofunction: Secondary | ICD-10-CM

## 2021-03-30 LAB — VITAMIN D 25 HYDROXY (VIT D DEFICIENCY, FRACTURES): VITD: 51.72 ng/mL (ref 30.00–100.00)

## 2021-03-31 LAB — TESTOSTERONE TOTAL,FREE,BIO, MALES
Albumin: 4.3 g/dL (ref 3.6–5.1)
Sex Hormone Binding: 27 nmol/L (ref 22–77)
Testosterone, Bioavailable: 84.1 ng/dL — ABNORMAL LOW (ref 110.0–575.0)
Testosterone, Free: 42.7 pg/mL — ABNORMAL LOW (ref 46.0–224.0)
Testosterone: 285 ng/dL (ref 250–827)

## 2021-04-05 NOTE — Addendum Note (Signed)
Addended byDamita Dunnings D on: 04/05/2021 07:50 AM   Modules accepted: Orders

## 2021-04-06 ENCOUNTER — Other Ambulatory Visit (HOSPITAL_COMMUNITY): Payer: Self-pay

## 2021-04-07 ENCOUNTER — Other Ambulatory Visit (HOSPITAL_COMMUNITY): Payer: Self-pay

## 2021-04-07 MED FILL — Bictegravir-Emtricitabine-Tenofovir AF Tab 50-200-25 MG: ORAL | 30 days supply | Qty: 30 | Fill #2 | Status: AC

## 2021-04-08 DIAGNOSIS — F319 Bipolar disorder, unspecified: Secondary | ICD-10-CM | POA: Diagnosis not present

## 2021-04-08 DIAGNOSIS — F902 Attention-deficit hyperactivity disorder, combined type: Secondary | ICD-10-CM | POA: Diagnosis not present

## 2021-04-08 DIAGNOSIS — F411 Generalized anxiety disorder: Secondary | ICD-10-CM | POA: Diagnosis not present

## 2021-04-08 DIAGNOSIS — F430 Acute stress reaction: Secondary | ICD-10-CM | POA: Diagnosis not present

## 2021-04-08 DIAGNOSIS — F5102 Adjustment insomnia: Secondary | ICD-10-CM | POA: Diagnosis not present

## 2021-04-12 ENCOUNTER — Other Ambulatory Visit (HOSPITAL_COMMUNITY): Payer: Self-pay

## 2021-05-05 ENCOUNTER — Other Ambulatory Visit (HOSPITAL_COMMUNITY): Payer: Self-pay

## 2021-05-06 ENCOUNTER — Other Ambulatory Visit (HOSPITAL_COMMUNITY): Payer: Self-pay

## 2021-05-07 ENCOUNTER — Other Ambulatory Visit (HOSPITAL_COMMUNITY): Payer: Self-pay

## 2021-05-07 MED FILL — Bictegravir-Emtricitabine-Tenofovir AF Tab 50-200-25 MG: ORAL | 30 days supply | Qty: 30 | Fill #3 | Status: AC

## 2021-05-10 ENCOUNTER — Other Ambulatory Visit: Payer: Self-pay | Admitting: Internal Medicine

## 2021-05-10 ENCOUNTER — Other Ambulatory Visit (HOSPITAL_COMMUNITY): Payer: Self-pay

## 2021-05-10 ENCOUNTER — Other Ambulatory Visit (HOSPITAL_BASED_OUTPATIENT_CLINIC_OR_DEPARTMENT_OTHER): Payer: Self-pay

## 2021-05-10 DIAGNOSIS — F3342 Major depressive disorder, recurrent, in full remission: Secondary | ICD-10-CM

## 2021-05-10 MED ORDER — CITALOPRAM HYDROBROMIDE 20 MG PO TABS
20.0000 mg | ORAL_TABLET | Freq: Every day | ORAL | 2 refills | Status: DC
  Filled 2021-05-10: qty 90, 90d supply, fill #0
  Filled 2021-08-02: qty 90, 90d supply, fill #1
  Filled 2021-11-03: qty 90, 90d supply, fill #2

## 2021-05-19 ENCOUNTER — Other Ambulatory Visit (HOSPITAL_BASED_OUTPATIENT_CLINIC_OR_DEPARTMENT_OTHER): Payer: Self-pay

## 2021-06-01 ENCOUNTER — Other Ambulatory Visit (HOSPITAL_COMMUNITY): Payer: Self-pay

## 2021-06-01 ENCOUNTER — Other Ambulatory Visit (HOSPITAL_BASED_OUTPATIENT_CLINIC_OR_DEPARTMENT_OTHER): Payer: Self-pay

## 2021-06-01 MED FILL — Bictegravir-Emtricitabine-Tenofovir AF Tab 50-200-25 MG: ORAL | 30 days supply | Qty: 30 | Fill #4 | Status: AC

## 2021-06-07 ENCOUNTER — Other Ambulatory Visit (HOSPITAL_COMMUNITY): Payer: Self-pay

## 2021-06-30 ENCOUNTER — Other Ambulatory Visit (HOSPITAL_COMMUNITY): Payer: Self-pay

## 2021-06-30 MED FILL — Bictegravir-Emtricitabine-Tenofovir AF Tab 50-200-25 MG: ORAL | 30 days supply | Qty: 30 | Fill #5 | Status: AC

## 2021-07-01 DIAGNOSIS — F5102 Adjustment insomnia: Secondary | ICD-10-CM | POA: Diagnosis not present

## 2021-07-01 DIAGNOSIS — F411 Generalized anxiety disorder: Secondary | ICD-10-CM | POA: Diagnosis not present

## 2021-07-01 DIAGNOSIS — U071 COVID-19: Secondary | ICD-10-CM

## 2021-07-01 DIAGNOSIS — F902 Attention-deficit hyperactivity disorder, combined type: Secondary | ICD-10-CM | POA: Diagnosis not present

## 2021-07-01 DIAGNOSIS — F319 Bipolar disorder, unspecified: Secondary | ICD-10-CM | POA: Diagnosis not present

## 2021-07-01 HISTORY — DX: COVID-19: U07.1

## 2021-07-07 ENCOUNTER — Other Ambulatory Visit (HOSPITAL_COMMUNITY): Payer: Self-pay

## 2021-07-12 ENCOUNTER — Other Ambulatory Visit (HOSPITAL_BASED_OUTPATIENT_CLINIC_OR_DEPARTMENT_OTHER): Payer: Self-pay

## 2021-07-12 ENCOUNTER — Emergency Department
Admission: EM | Admit: 2021-07-12 | Discharge: 2021-07-12 | Disposition: A | Discharge status: Home / Self Care | Payer: 59 | Source: Home / Self Care | Attending: Family Medicine | Admitting: Family Medicine

## 2021-07-12 ENCOUNTER — Other Ambulatory Visit: Payer: Self-pay

## 2021-07-12 DIAGNOSIS — R519 Headache, unspecified: Secondary | ICD-10-CM

## 2021-07-12 DIAGNOSIS — J3489 Other specified disorders of nose and nasal sinuses: Secondary | ICD-10-CM

## 2021-07-12 DIAGNOSIS — J069 Acute upper respiratory infection, unspecified: Secondary | ICD-10-CM | POA: Diagnosis not present

## 2021-07-12 MED ORDER — IBUPROFEN 800 MG PO TABS
800.0000 mg | ORAL_TABLET | Freq: Three times a day (TID) | ORAL | 0 refills | Status: DC | PRN
  Filled 2021-07-12: qty 90, 30d supply, fill #0

## 2021-07-12 MED ORDER — FLUTICASONE PROPIONATE 50 MCG/ACT NA SUSP
2.0000 | Freq: Every day | NASAL | 0 refills | Status: DC
  Filled 2021-07-12: qty 16, 30d supply, fill #0

## 2021-07-12 MED ORDER — AZITHROMYCIN 250 MG PO TABS
ORAL_TABLET | ORAL | 0 refills | Status: DC
  Filled 2021-07-12: qty 6, 5d supply, fill #0

## 2021-07-12 NOTE — ED Provider Notes (Signed)
[facility]    CSN: [id number] Arrival date & time: 07/12/21  1321      History   Chief Complaint Chief Complaint  Patient presents with   Otalgia   Headache   Facial Pain    HPI Bruce Woods is a 60 y.o. male.   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.  He states that he has been "overworked" the last 2 weeks working over 50 hours each week.  He states that he has been exposed to a variety of infections including COVID, influenza, malaria, strep.  He currently has an upper respiratory infection.  Sinus pressure and pain.  Ear pain.  Scratchy throat.  Postnasal drip.  Headache.  He is COVID vaccinated.  He states he did a COVID test that was negative.  He is requesting a Z-Pak.  He states that he traditionally has needed antibiotics for his "sinus infections". We did have a discussion about sinus infections being caused by viruses.  This is according to the [facility] and infectious disease consultants.  He understands that antibiotics are only indicated if he fails to improve with conservative management over 7 to 10 days.  Past Medical History:  Diagnosis Date   DEPRESSION    GERD (gastroesophageal reflux disease)    HIV DISEASE    IBS (irritable bowel syndrome)    Sarcoidosis 2000   question of   Ulnar nerve compression, right     Patient Active Problem List   Diagnosis Date Noted   Hypertension, essential 10/27/2020   Anxiety 10/14/2020   Elevated blood pressure reading 10/14/2020   Medial epicondylitis of elbow, right 04/20/2020   Dyspnea on exertion 08/01/2017   PCP NOTES >>>>>>>>>>>>>>>>>>>>>>>>> 01/01/2016   Insomnia 11/19/2013   Annual physical exam 10/01/2013   Fatigue, low vit D 08/19/2011   Erectile dysfunction 08/19/2011   Depression 01/03/2007   Human immunodeficiency virus (HIV) disease ([place]) 08/18/2006   SARCOIDOSIS 08/18/2006    Past Surgical History:  Procedure Laterality Date   LUNG SURGERY  2000   vats    TONSILLECTOMY AND ADENOIDECTOMY     ULNAR NERVE TRANSPOSITION Right 05/17/2018   Procedure: RIGHT ULNAR NERVE DECOMPRESSION/TRANSPOSITION;  Surgeon: Leanora Cover, MD;  Location: [facility];  Service: Orthopedics;  Laterality: Right;       Home Medications    Prior to Admission medications   Medication Sig Start Date End Date Taking? Authorizing Provider  azithromycin (ZITHROMAX Z-PAK) 250 MG tablet Take 2 tablets by mouth today followed by 1 tablet a day until gone 07/12/21  Yes Raylene Everts, MD  fluticasone [place]) 50 MCG/ACT nasal spray Place 2 sprays into both nostrils daily. 07/12/21  Yes Raylene Everts, MD  ibuprofen (ADVIL) 800 MG tablet Take 1 tablet (800 mg total) by mouth every 8 (eight) hours as needed for moderate pain. 07/12/21  Yes Raylene Everts, MD  amLODipine (NORVASC) 5 MG tablet Take 1 tablet (5 mg total) by mouth daily. 02/15/21   Colon Branch, MD  bictegravir-emtricitabine-tenofovir AF (BIKTARVY) 50-200-25 MG TABS tablet TAKE 1 TABLET BY MOUTH DAILY. 10/14/20 10/14/21  Michel Bickers, MD  citalopram (CELEXA) 20 MG tablet Take 1 tablet (20 mg total) by mouth daily. 05/10/21   Colon Branch, MD  famotidine (PEPCID) 20 MG tablet Take 20 mg by mouth 2 (two) times daily.    [provider]  Vitamin D, Ergocalciferol, (DRISDOL) 1.25 MG (50000 UNIT) CAPS capsule TAKE 1  CAPSULE BY MOUTH EVERY 7 DAYS 01/18/21 01/18/22  Colon Branch, MD  zolpidem (AMBIEN) 10 MG tablet TAKE 1 TABLET BY MOUTH EVERY NIGHT AT BEDTIME AS NEEDED FOR SLEEP 10/26/20 04/24/21  Colon Branch, MD    Family History Family History  Problem Relation Age of Onset   Hyperlipidemia Mother    Hypertension Mother    Cancer Mother        glioblastoma   Sudden death Neg Hx    Heart attack Neg Hx    Diabetes Neg Hx    Colon cancer Neg Hx    Prostate cancer Neg Hx     Social History Social History   Tobacco Use   Smoking status: Former    Packs/day: 1.00    Years: 2.00    Pack  years: 2.00    Types: Cigarettes    Quit date: 11/01/1983    Years since quitting: 37.7   Smokeless tobacco: Never  Vaping Use   Vaping Use: Never used  Substance Use Topics   Alcohol use: Yes    Alcohol/week: 1.0 standard drink    Types: 1 Standard drinks or equivalent per week    Comment: socially    Drug use: No     Allergies   Morphine, Doxycycline, Celebrex [celecoxib], and Wellbutrin [bupropion]   Review of Systems Review of Systems See HPI  Physical Exam Triage Vital Signs ED Triage Vitals  Enc Vitals Group     BP 07/12/21 1343 138/89     Pulse Rate 07/12/21 1343 79     Resp 07/12/21 1343 18     Temp 07/12/21 1343 98.3 F (36.8 C)     Temp Source 07/12/21 1343 Oral     SpO2 07/12/21 1343 95 %     Weight 07/12/21 1340 192 lb (87.1 kg)     Height 07/12/21 1340 '5\' 8"'$  (1.727 m)     Head Circumference --      Peak Flow --      Pain Score 07/12/21 1339 6     Pain Loc --      Pain Edu? --      Excl. in [place]? --    No data found.  Updated Vital Signs BP 138/89 (BP Location: Right Arm)   Pulse 79   Temp 98.3 F (36.8 C) (Oral)   Resp 18   Ht '5\' 8"'$  (1.727 m)   Wt 87.1 kg   SpO2 95%   BMI 29.19 kg/m      Physical Exam Vitals reviewed.  Constitutional:      General: He is not in acute distress.    Appearance: He is well-developed. He is ill-appearing.     Comments: Appears tired  HENT:     Head: Normocephalic and atraumatic.     Right Ear: Tympanic membrane, ear canal and external ear normal.     Left Ear: Tympanic membrane, ear canal and external ear normal.     Ears:     Comments: Slight injection of left TM    Nose: Congestion and rhinorrhea present.     Mouth/Throat:     Mouth: Mucous membranes are moist.     Pharynx: Posterior oropharyngeal erythema present.     Comments: Mild injection posterior pharynx.  No exudate.  Maxillary sinuses tender to percussion Eyes:     Conjunctiva/sclera: Conjunctivae normal.     Pupils: Pupils are equal, round,  and reactive to light.  Cardiovascular:     Rate and Rhythm: Normal  rate and regular rhythm.     Heart sounds: Normal heart sounds.  Pulmonary:     Effort: Pulmonary effort is normal. No respiratory distress.     Breath sounds: Normal breath sounds. No wheezing or rales.  Abdominal:     General: There is no distension.     Palpations: Abdomen is soft.  Musculoskeletal:        General: Normal range of motion.     Cervical back: Normal range of motion.  Lymphadenopathy:     Cervical: No cervical adenopathy.  Skin:    General: Skin is warm and dry.  Neurological:     Mental Status: He is alert.  Psychiatric:        Mood and Affect: Mood normal.        Behavior: Behavior normal.     UC Treatments / Results  Labs (all labs ordered are listed, but only abnormal results are displayed) Labs Reviewed - No data to display  EKG   Radiology No results found.  Procedures Procedures (including critical care time)  Medications Ordered in UC Medications - No data to display  Initial Impression / Assessment and Plan / UC Course  I have reviewed the triage vital signs and the nursing notes.  Pertinent labs & imaging results that were available during my care of the patient were reviewed by me and considered in my medical decision making (see chart for details).     Patient is given a printed prescription of Z-Pak to fill and use if not improved as we discussed.  For now he will use Flonase, push fluids, Tylenol or ibuprofen for pain.  Rest.  Return as needed Final Clinical Impressions(s) / UC Diagnoses   Final diagnoses:  Viral upper respiratory tract infection  Bad headache  Pain of maxillary sinus     Discharge Instructions      Drink lots of fluids Take Z-Pak as directed Use Flonase once a day May take ibuprofen as needed pain Return as needed   ED Prescriptions     Medication Sig Dispense Auth. Provider   azithromycin (ZITHROMAX Z-PAK) 250 MG tablet Take 2  tablets by mouth today followed by 1 tablet a day until gone 6 tablet Raylene Everts, MD   fluticasone [facility]) 50 MCG/ACT nasal spray Place 2 sprays into both nostrils daily. 16 g Raylene Everts, MD   ibuprofen (ADVIL) 800 MG tablet Take 1 tablet (800 mg total) by mouth every 8 (eight) hours as needed for moderate pain. 90 tablet Raylene Everts, MD      PDMP not reviewed this encounter.   Raylene Everts, MD 07/12/21 [phone]

## 2021-07-12 NOTE — ED Triage Notes (Signed)
Pt presents to Urgent Care with c/o bilateral ear pain, frontal HA, facial pain, and upper teeth pain since last night.

## 2021-07-12 NOTE — Discharge Instructions (Addendum)
Drink lots of fluids Take Z-Pak as directed Use Flonase once a day May take ibuprofen as needed pain Return as needed

## 2021-07-15 ENCOUNTER — Telehealth: Payer: Self-pay | Admitting: *Deleted

## 2021-07-15 NOTE — Telephone Encounter (Signed)
Noted, thx.

## 2021-07-15 NOTE — Telephone Encounter (Signed)
Spoke with patient wife and patient still declines visit and any medications at this time.  His symptoms started on Monday.  Advised about the time frame for getting put on antiviral.

## 2021-07-15 NOTE — Telephone Encounter (Signed)
Pt wife stopped me outside of building and wanted me to let you know that patient is covid positive and does not want to ask for the antiviral.  She feels like he needs it.  Patient had urgent care visit on 07/12/21.  She stated you can call him if needed.

## 2021-07-15 NOTE — Telephone Encounter (Addendum)
I reviewed a note from the urgent care 07/12/2021, at that time he reported negative COVID test and requested a Z-Pak. If he is now COVID-positive, needs a visit to discuss, he might be outside of the window for treatment. Unfortunately I do not have anything this afternoon, please arrange a visit with another provider or a televisit.

## 2021-07-23 ENCOUNTER — Other Ambulatory Visit (HOSPITAL_BASED_OUTPATIENT_CLINIC_OR_DEPARTMENT_OTHER): Payer: Self-pay

## 2021-07-23 MED ORDER — INFLUENZA VAC SPLIT QUAD 0.5 ML IM SUSY
PREFILLED_SYRINGE | INTRAMUSCULAR | 0 refills | Status: DC
  Filled 2021-07-23: qty 0.5, 1d supply, fill #0

## 2021-07-30 ENCOUNTER — Other Ambulatory Visit (HOSPITAL_COMMUNITY): Payer: Self-pay

## 2021-07-30 MED FILL — Bictegravir-Emtricitabine-Tenofovir AF Tab 50-200-25 MG: ORAL | 30 days supply | Qty: 30 | Fill #6 | Status: AC

## 2021-08-02 ENCOUNTER — Other Ambulatory Visit (HOSPITAL_BASED_OUTPATIENT_CLINIC_OR_DEPARTMENT_OTHER): Payer: Self-pay

## 2021-08-04 ENCOUNTER — Other Ambulatory Visit (HOSPITAL_COMMUNITY): Payer: Self-pay

## 2021-08-17 ENCOUNTER — Other Ambulatory Visit: Payer: Self-pay | Admitting: Internal Medicine

## 2021-08-18 ENCOUNTER — Other Ambulatory Visit (HOSPITAL_BASED_OUTPATIENT_CLINIC_OR_DEPARTMENT_OTHER): Payer: Self-pay

## 2021-08-18 MED ORDER — AMLODIPINE BESYLATE 5 MG PO TABS
5.0000 mg | ORAL_TABLET | Freq: Every day | ORAL | 2 refills | Status: DC
  Filled 2021-08-18: qty 90, 90d supply, fill #0
  Filled 2021-11-19: qty 90, 90d supply, fill #1
  Filled 2022-02-09: qty 90, 90d supply, fill #2

## 2021-08-31 ENCOUNTER — Other Ambulatory Visit (HOSPITAL_COMMUNITY): Payer: Self-pay

## 2021-08-31 MED FILL — Bictegravir-Emtricitabine-Tenofovir AF Tab 50-200-25 MG: ORAL | 30 days supply | Qty: 30 | Fill #7 | Status: AC

## 2021-09-01 DIAGNOSIS — C44319 Basal cell carcinoma of skin of other parts of face: Secondary | ICD-10-CM | POA: Diagnosis not present

## 2021-09-01 DIAGNOSIS — K5900 Constipation, unspecified: Secondary | ICD-10-CM | POA: Diagnosis not present

## 2021-09-01 DIAGNOSIS — K644 Residual hemorrhoidal skin tags: Secondary | ICD-10-CM | POA: Diagnosis not present

## 2021-09-01 DIAGNOSIS — R6881 Early satiety: Secondary | ICD-10-CM | POA: Diagnosis not present

## 2021-09-01 DIAGNOSIS — D485 Neoplasm of uncertain behavior of skin: Secondary | ICD-10-CM | POA: Diagnosis not present

## 2021-09-01 DIAGNOSIS — R110 Nausea: Secondary | ICD-10-CM | POA: Diagnosis not present

## 2021-09-01 DIAGNOSIS — F319 Bipolar disorder, unspecified: Secondary | ICD-10-CM | POA: Diagnosis not present

## 2021-09-01 DIAGNOSIS — L578 Other skin changes due to chronic exposure to nonionizing radiation: Secondary | ICD-10-CM | POA: Diagnosis not present

## 2021-09-03 DIAGNOSIS — K2970 Gastritis, unspecified, without bleeding: Secondary | ICD-10-CM | POA: Diagnosis not present

## 2021-09-03 DIAGNOSIS — K449 Diaphragmatic hernia without obstruction or gangrene: Secondary | ICD-10-CM | POA: Diagnosis not present

## 2021-09-03 DIAGNOSIS — K2100 Gastro-esophageal reflux disease with esophagitis, without bleeding: Secondary | ICD-10-CM | POA: Diagnosis not present

## 2021-09-08 ENCOUNTER — Other Ambulatory Visit (HOSPITAL_COMMUNITY): Payer: Self-pay

## 2021-09-09 DIAGNOSIS — F319 Bipolar disorder, unspecified: Secondary | ICD-10-CM | POA: Diagnosis not present

## 2021-09-09 DIAGNOSIS — F902 Attention-deficit hyperactivity disorder, combined type: Secondary | ICD-10-CM | POA: Diagnosis not present

## 2021-09-09 DIAGNOSIS — F5102 Adjustment insomnia: Secondary | ICD-10-CM | POA: Diagnosis not present

## 2021-09-09 DIAGNOSIS — F411 Generalized anxiety disorder: Secondary | ICD-10-CM | POA: Diagnosis not present

## 2021-09-21 ENCOUNTER — Telehealth: Payer: Self-pay | Admitting: Nurse Practitioner

## 2021-09-21 ENCOUNTER — Encounter: Payer: Self-pay | Admitting: Internal Medicine

## 2021-09-21 ENCOUNTER — Emergency Department (HOSPITAL_BASED_OUTPATIENT_CLINIC_OR_DEPARTMENT_OTHER)
Admission: EM | Admit: 2021-09-21 | Discharge: 2021-09-21 | Disposition: A | Discharge status: Home / Self Care | Payer: 59 | Attending: Emergency Medicine | Admitting: Emergency Medicine

## 2021-09-21 ENCOUNTER — Other Ambulatory Visit (HOSPITAL_BASED_OUTPATIENT_CLINIC_OR_DEPARTMENT_OTHER): Payer: Self-pay

## 2021-09-21 ENCOUNTER — Encounter (HOSPITAL_BASED_OUTPATIENT_CLINIC_OR_DEPARTMENT_OTHER): Payer: Self-pay

## 2021-09-21 ENCOUNTER — Emergency Department (HOSPITAL_BASED_OUTPATIENT_CLINIC_OR_DEPARTMENT_OTHER): Payer: 59

## 2021-09-21 ENCOUNTER — Other Ambulatory Visit: Payer: Self-pay

## 2021-09-21 ENCOUNTER — Ambulatory Visit: Payer: 59 | Admitting: Internal Medicine

## 2021-09-21 VITALS — BP 142/100 | HR 86 | Temp 98.0°F | Resp 16 | Ht 68.0 in | Wt 196.5 lb

## 2021-09-21 DIAGNOSIS — Z79899 Other long term (current) drug therapy: Secondary | ICD-10-CM | POA: Insufficient documentation

## 2021-09-21 DIAGNOSIS — R1013 Epigastric pain: Secondary | ICD-10-CM | POA: Diagnosis not present

## 2021-09-21 DIAGNOSIS — K219 Gastro-esophageal reflux disease without esophagitis: Secondary | ICD-10-CM | POA: Diagnosis not present

## 2021-09-21 DIAGNOSIS — J90 Pleural effusion, not elsewhere classified: Secondary | ICD-10-CM | POA: Diagnosis not present

## 2021-09-21 DIAGNOSIS — I10 Essential (primary) hypertension: Secondary | ICD-10-CM | POA: Diagnosis not present

## 2021-09-21 DIAGNOSIS — R1011 Right upper quadrant pain: Secondary | ICD-10-CM | POA: Diagnosis not present

## 2021-09-21 DIAGNOSIS — R110 Nausea: Secondary | ICD-10-CM | POA: Diagnosis not present

## 2021-09-21 DIAGNOSIS — R1012 Left upper quadrant pain: Secondary | ICD-10-CM | POA: Diagnosis not present

## 2021-09-21 DIAGNOSIS — Z87891 Personal history of nicotine dependence: Secondary | ICD-10-CM | POA: Diagnosis not present

## 2021-09-21 DIAGNOSIS — Z21 Asymptomatic human immunodeficiency virus [HIV] infection status: Secondary | ICD-10-CM | POA: Diagnosis not present

## 2021-09-21 DIAGNOSIS — R079 Chest pain, unspecified: Secondary | ICD-10-CM | POA: Diagnosis not present

## 2021-09-21 LAB — CBC WITH DIFFERENTIAL/PLATELET
Abs Immature Granulocytes: 0.02 10*3/uL (ref 0.00–0.07)
Basophils Absolute: 0 10*3/uL (ref 0.0–0.1)
Basophils Relative: 0 %
Eosinophils Absolute: 0 10*3/uL (ref 0.0–0.5)
Eosinophils Relative: 0 %
HCT: 48.5 % (ref 39.0–52.0)
Hemoglobin: 17.5 g/dL — ABNORMAL HIGH (ref 13.0–17.0)
Immature Granulocytes: 0 %
Lymphocytes Relative: 38 %
Lymphs Abs: 3.5 10*3/uL (ref 0.7–4.0)
MCH: 32.5 pg (ref 26.0–34.0)
MCHC: 36.1 g/dL — ABNORMAL HIGH (ref 30.0–36.0)
MCV: 90.1 fL (ref 80.0–100.0)
Monocytes Absolute: 0.4 10*3/uL (ref 0.1–1.0)
Monocytes Relative: 5 %
Neutro Abs: 5.1 10*3/uL (ref 1.7–7.7)
Neutrophils Relative %: 57 %
Platelets: 230 10*3/uL (ref 150–400)
RBC: 5.38 MIL/uL (ref 4.22–5.81)
RDW: 13.1 % (ref 11.5–15.5)
WBC: 9.1 10*3/uL (ref 4.0–10.5)
nRBC: 0 % (ref 0.0–0.2)

## 2021-09-21 LAB — COMPREHENSIVE METABOLIC PANEL
ALT: 20 U/L (ref 0–44)
AST: 22 U/L (ref 15–41)
Albumin: 4.5 g/dL (ref 3.5–5.0)
Alkaline Phosphatase: 71 U/L (ref 38–126)
Anion gap: 9 (ref 5–15)
BUN: 16 mg/dL (ref 8–23)
CO2: 25 mmol/L (ref 22–32)
Calcium: 9 mg/dL (ref 8.9–10.3)
Chloride: 98 mmol/L (ref 98–111)
Creatinine, Ser: 1.36 mg/dL — ABNORMAL HIGH (ref 0.61–1.24)
GFR, Estimated: 59 mL/min — ABNORMAL LOW (ref >60)
Glucose, Bld: 129 mg/dL — ABNORMAL HIGH (ref 70–99)
Potassium: 3.2 mmol/L — ABNORMAL LOW (ref 3.5–5.1)
Sodium: 132 mmol/L — ABNORMAL LOW (ref 135–145)
Total Bilirubin: 1.1 mg/dL (ref 0.3–1.2)
Total Protein: 7.9 g/dL (ref 6.5–8.1)

## 2021-09-21 LAB — TROPONIN I (HIGH SENSITIVITY): Troponin I (High Sensitivity): 4 ng/L (ref <18)

## 2021-09-21 LAB — LIPASE, BLOOD: Lipase: 33 U/L (ref 11–51)

## 2021-09-21 MED ORDER — LACTATED RINGERS IV BOLUS
1000.0000 mL | Freq: Once | INTRAVENOUS | Status: AC
  Administered 2021-09-21: 1000 mL via INTRAVENOUS

## 2021-09-21 MED ORDER — OMEPRAZOLE 40 MG PO CPDR
40.0000 mg | DELAYED_RELEASE_CAPSULE | Freq: Two times a day (BID) | ORAL | 0 refills | Status: DC
  Filled 2021-09-21: qty 60, 30d supply, fill #0

## 2021-09-21 MED ORDER — FENTANYL CITRATE PF 50 MCG/ML IJ SOSY
100.0000 ug | PREFILLED_SYRINGE | Freq: Once | INTRAMUSCULAR | Status: AC
  Administered 2021-09-21: 100 ug via INTRAVENOUS
  Filled 2021-09-21: qty 2

## 2021-09-21 MED ORDER — SUCRALFATE 1 G PO TABS
1.0000 g | ORAL_TABLET | Freq: Three times a day (TID) | ORAL | 0 refills | Status: DC
  Filled 2021-09-21: qty 90, 23d supply, fill #0

## 2021-09-21 MED ORDER — ONDANSETRON HCL 4 MG PO TABS
8.0000 mg | ORAL_TABLET | Freq: Three times a day (TID) | ORAL | 0 refills | Status: DC | PRN
  Filled 2021-09-21: qty 12, 2d supply, fill #0

## 2021-09-21 MED ORDER — SUCRALFATE 1 G PO TABS
1.0000 g | ORAL_TABLET | Freq: Once | ORAL | Status: DC
  Filled 2021-09-21: qty 1

## 2021-09-21 MED ORDER — PANTOPRAZOLE SODIUM 40 MG IV SOLR
40.0000 mg | Freq: Once | INTRAVENOUS | Status: AC
  Administered 2021-09-21: 40 mg via INTRAVENOUS
  Filled 2021-09-21: qty 40

## 2021-09-21 MED ORDER — OXYCODONE-ACETAMINOPHEN 5-325 MG PO TABS
1.0000 | ORAL_TABLET | Freq: Four times a day (QID) | ORAL | 0 refills | Status: DC | PRN
  Filled 2021-09-21: qty 6, 2d supply, fill #0

## 2021-09-21 MED ORDER — ALUM & MAG HYDROXIDE-SIMETH 200-200-20 MG/5ML PO SUSP
30.0000 mL | Freq: Once | ORAL | Status: AC
  Administered 2021-09-21: 30 mL via ORAL

## 2021-09-21 MED ORDER — IOHEXOL 300 MG/ML  SOLN
100.0000 mL | Freq: Once | INTRAMUSCULAR | Status: AC | PRN
  Administered 2021-09-21: 100 mL via INTRAVENOUS

## 2021-09-21 MED ORDER — ONDANSETRON HCL 4 MG/2ML IJ SOLN
4.0000 mg | Freq: Once | INTRAMUSCULAR | Status: AC
  Administered 2021-09-21: 4 mg via INTRAVENOUS
  Filled 2021-09-21: qty 2

## 2021-09-21 MED ORDER — FENTANYL CITRATE PF 50 MCG/ML IJ SOSY
75.0000 ug | PREFILLED_SYRINGE | Freq: Once | INTRAMUSCULAR | Status: AC
  Administered 2021-09-21: 75 ug via INTRAVENOUS
  Filled 2021-09-21: qty 2

## 2021-09-21 MED ORDER — ALUM & MAG HYDROXIDE-SIMETH 200-200-20 MG/5ML PO SUSP
15.0000 mL | Freq: Once | ORAL | Status: DC
  Filled 2021-09-21: qty 30

## 2021-09-21 NOTE — ED Notes (Signed)
ED Provider at bedside. 

## 2021-09-21 NOTE — ED Provider Notes (Signed)
4:10 PM Care assumed from Dr. Kathrynn Humble.  At time of transfer of care, patient is awaiting results of CT scan and then discussing with patient a plan.  Due to the severity of his pain and nausea and vomiting, if the CT scan was reassuring, there was concern that there could be a need for endoscopy to rule out ulceration or other intra gastric or duodenal cause of his discomfort.   After speaking with the patient, plan of care will be to touch base with Dr. Evern Bio office and discuss if he would be able to do an endoscopy on patient tomorrow if he is admitted to the hospital.  Spoke with Dr. Carlean Purl who said that he was fairly busy as an outpatient to try to do a scope in the next week but if patient was admitted he could likely get scoped tomorrow or the next day.  If the patient was feeling worse, Dr. Arelia Longest agreed with admission to medicine and he would tell his team to see the patient in the morning.  I reassessed patient and he reports he is feeling well and would rather go home as opposed to admission.  Patient already has prescriptions picked up and he passed gentle p.o. challenge here.  Patient will be discharged with extremely strict return precautions for any new or worsened symptoms and if he returns, anticipate admission for GI to scope.   Clinical Impression: 1. Epigastric abdominal pain     Disposition: Discharge  Condition: Good  I have discussed the results, Dx and Tx plan with the pt(& family if present). He/she/they expressed understanding and agree(s) with the plan. Discharge instructions discussed at great length. Strict return precautions discussed and pt &/or family have verbalized understanding of the instructions. No further questions at time of discharge.    Discharge Medication List as of 09/21/2021  5:54 PM     START taking these medications   Details  omeprazole (PRILOSEC) 40 MG capsule Take 1 capsule (40 mg total) by mouth 2 (two) times daily before a meal.,  Starting Tue 09/21/2021, Until Thu 10/21/2021, Normal    ondansetron (ZOFRAN) 4 MG tablet Take 2 tablets (8 mg total) by mouth every 8 (eight) hours as needed for nausea or vomiting., Starting Tue 09/21/2021, Normal    oxyCODONE-acetaminophen (PERCOCET/ROXICET) 5-325 MG tablet Take 1 tablet by mouth every 6 (six) hours as needed for severe pain., Starting Tue 09/21/2021, Normal    sucralfate (CARAFATE) 1 g tablet Take 1 tablet (1 g total) by mouth 4 (four) times daily -  with meals and at bedtime., Starting Tue 09/21/2021, Normal        Follow Up: [facility] [place] [phone] In 1 week      Shrihan Putt, Gwenyth Allegra, MD 09/21/21 2154

## 2021-09-21 NOTE — Discharge Instructions (Addendum)
Please call of our GI tomorrow morning if you do not hear from them today. Take the medications as prescribed.  Diet should be clear liquid or soft -avoiding cheese, meats and fatty foods.Marland Kitchen

## 2021-09-21 NOTE — ED Triage Notes (Signed)
Pt arrives ambulatory to ED, sent from primary doctor after being seen for epigastric pain X 4 days. Pt has had nausea, no vomiting or diarrhea, denies urinary complaints.

## 2021-09-21 NOTE — Patient Instructions (Signed)
Proceed to the ER

## 2021-09-21 NOTE — Assessment & Plan Note (Signed)
Assessment: HIV + HTN: DX 10/26/2020 Depression ED Vitamin D deficiency ?sarcoidosis dx 2000 CP, presyncope, palpitation: 2018>> saw cardiology and pulmonary. CXR, ECHO, GTX, cardiac event monitor negative.   Peyronie's dz , penis MSK: h/o hip bursitis, sees Dr Ninfa Linden  PLAN Epigastric pain: Symptoms started 4 weeks ago, worse for 1 week, looks uncomfortable, DDx is large but includes PUD, cholelithiasis, pancreatitis and others. Needs further evaluation today. Spoke with ER MD who accepts to see the patient, appreciate his help. Patient in agreement to go to the ER

## 2021-09-21 NOTE — Progress Notes (Signed)
Subjective:    Patient ID: Bruce Woods, male    DOB: 05-25-1961, 60 y.o.   MRN: [id number]  DOS:  09/21/2021 Type of visit - description: Acute  Symptoms a started 4 weeks ago with heartburn, belching, he started to take OTC Pepcid, Tums and also a leftover Zofran.  A week ago, symptoms got worse,  developed a persistent epigastric pain "like something is sitting in there". Pain definitely increases with eating. The pain on the epigastric area is burning and it radiates both to the left and right sides of the abdomen and also to his back.  No fever chills Some nausea but no vomiting. No actual dysphagia or odynophagia. Bowel movements are not daily (he is not eating much) No recent NSAIDs Denies any blood in the urine  Review of Systems See above   Past Medical History:  Diagnosis Date   DEPRESSION    GERD (gastroesophageal reflux disease)    HIV DISEASE    IBS (irritable bowel syndrome)    Sarcoidosis 2000   question of   Ulnar nerve compression, right     Past Surgical History:  Procedure Laterality Date   LUNG SURGERY  2000   vats   TONSILLECTOMY AND ADENOIDECTOMY     ULNAR NERVE TRANSPOSITION Right 05/17/2018   Procedure: RIGHT ULNAR NERVE DECOMPRESSION/TRANSPOSITION;  Surgeon: Leanora Cover, MD;  Location: [facility];  Service: Orthopedics;  Laterality: Right;    Allergies as of 09/21/2021       Reactions   Morphine Other (See Comments)   Resp distress, rash, tachycardia   Doxycycline Nausea And Vomiting   Celebrex [celecoxib] Other (See Comments)   Elevated LFT's   Wellbutrin [bupropion] Hives        Medication List        Accurate as of September 21, 2021 11:05 AM. If you have any questions, ask your nurse or doctor.          STOP taking these medications    Fluarix Quadrivalent 0.5 ML injection Generic drug: influenza vac split quadrivalent PF Stopped by: Kathlene November, MD       TAKE these medications    amLODipine 5 MG  tablet Commonly known as: NORVASC Take 1 tablet (5 mg total) by mouth daily.   azithromycin 250 MG tablet Commonly known as: Zithromax Z-Pak Take 2 tablets by mouth today followed by 1 tablet a day until gone   Biktarvy 50-200-25 MG Tabs tablet Generic drug: bictegravir-emtricitabine-tenofovir AF TAKE 1 TABLET BY MOUTH DAILY.   citalopram 20 MG tablet Commonly known as: CELEXA Take 1 tablet (20 mg total) by mouth daily.   famotidine 20 MG tablet Commonly known as: PEPCID Take 20 mg by mouth 2 (two) times daily.   fluticasone 50 MCG/ACT nasal spray Commonly known as: FLONASE Place 2 sprays into both nostrils daily.   ibuprofen 800 MG tablet Commonly known as: ADVIL Take 1 tablet (800 mg total) by mouth every 8 (eight) hours as needed for moderate pain.   Vitamin D (Ergocalciferol) 1.25 MG (50000 UNIT) Caps capsule Commonly known as: DRISDOL TAKE 1 CAPSULE BY MOUTH EVERY 7 DAYS   zolpidem 10 MG tablet Commonly known as: AMBIEN TAKE 1 TABLET BY MOUTH EVERY NIGHT AT BEDTIME AS NEEDED FOR SLEEP           Objective:   Physical Exam BP (!) 142/100 (BP Location: Left Arm, Patient Position: Sitting, Cuff Size: Small)   Pulse 86   Temp 98 F (36.7  C) (Oral)   Resp 16   Ht 5\' 8"  (1.727 m)   Wt 196 lb 8 oz (89.1 kg)   SpO2 98%   BMI 29.88 kg/m  General:   Well developed, looks uncomfortable, grabbing his abdomen. HEENT:  Normocephalic . Face symmetric, atraumatic Lungs:  CTA B Normal respiratory effort, no intercostal retractions, no accessory muscle use. Heart: RRR,  no murmur.  Abdomen:  Not distended, soft, tender at the epigastric area, no mass or rebound. Skin: Not pale. Not jaundice Lower extremities: no pretibial edema bilaterally  Neurologic:  alert & oriented X3.  Speech normal, gait antalgic, walks around bending over and grabbing his abdomen Psych--  Cognition and judgment appear intact.  Cooperative with normal attention span and concentration.   Behavior appropriate. No anxious or depressed appearing.     Assessment    Assessment: HIV + HTN: DX 10/26/2020 Depression ED Vitamin D deficiency ?sarcoidosis dx 2000 CP, presyncope, palpitation: 2018>> saw cardiology and pulmonary. CXR, ECHO, GTX, cardiac event monitor negative.   Peyronie's dz , penis MSK: h/o hip bursitis, sees Dr Ninfa Linden  PLAN Epigastric pain: Symptoms started 4 weeks ago, worse for 1 week, looks uncomfortable, DDx is large but includes PUD, cholelithiasis, pancreatitis and others. Needs further evaluation today. Spoke with ER MD who accepts to see the patient, appreciate his help. Patient in agreement to go to the ER     This visit occurred during the SARS-CoV-2 public health emergency.  Safety protocols were in place, including screening questions prior to the visit, additional usage of staff PPE, and extensive cleaning of exam room while observing appropriate contact time as indicated for disinfecting solutions.

## 2021-09-21 NOTE — Telephone Encounter (Signed)
I spoke to Dr. Angus Seller who was calling from [facility] ED.  Bruce Woods remains fairly miserable.  They were wondering if it were possible to get an expedited outpatient EGD and it just is not.  I have recommended they admit him to the hospital for observation versus inpatient and we can see him and get him the testing he needs.  It sounds like an EGD is appropriate.  The patient has a remote history with Dr. Henrene Pastor but I take care of the whole family otherwise and and if necessary and requested I will assume his continuity GI care after this event.

## 2021-09-21 NOTE — Telephone Encounter (Signed)
Patients wife called requesting to speak with a nurse regarding her spouse being in ED she said the provider is recommending a scope procedure but Tye Savoy is suggesting something else. She is highly upset about her decision.

## 2021-09-21 NOTE — Telephone Encounter (Signed)
Left message to call back to schedule appt per Nevin Bloodgood Guenther's request. Pt was seen at the ED at [facility] today for abd pain. Pls schedule pt appt with her.

## 2021-09-21 NOTE — Telephone Encounter (Signed)
Spoke with pts wife and she wanted to  let Dr. Carlean Purl know that Dr. Magdalene Patricia (ER MD at [facility]) will be calling him to discuss her husband. States they feel he needs to have an EGD now.

## 2021-09-21 NOTE — Telephone Encounter (Signed)
Returned call to pts wife, left message for her to call back.

## 2021-09-21 NOTE — ED Provider Notes (Signed)
[facility] EMERGENCY DEPARTMENT Provider Note   CSN: [id number] Arrival date & time: 09/21/21  1120     History Chief Complaint  Patient presents with   Abdominal Pain    Bruce Woods is a 60 y.o. male.  HPI     60 year old male comes in with chief complaint of abdominal pain.  He has history of IBS, sarcoidosis, well-controlled HIV.  He reports that this current abdominal pain has been present for about 4 to 5 days.  The pain is located in the epigastric region.  The pain is provoked by food intake.  he has had increased belching with p.o. intake and some nausea.  The last couple nights the pain has been more severe, patient has been very uncomfortable and not able to get good rest.  Pain radiates laterally on the both sides.  No history of heavy smoking, heavy drinking, no history of similar pain.  Patient has history of GERD and has been taking several Pepcid's and Tums over the last 2 days with transient relief.  No history of abdominal surgeries, gallbladder disorder.  Past Medical History:  Diagnosis Date   DEPRESSION    GERD (gastroesophageal reflux disease)    HIV DISEASE    IBS (irritable bowel syndrome)    Sarcoidosis 2000   question of   Ulnar nerve compression, right     Patient Active Problem List   Diagnosis Date Noted   Hypertension, essential 10/27/2020   Anxiety 10/14/2020   Elevated blood pressure reading 10/14/2020   Medial epicondylitis of elbow, right 04/20/2020   Dyspnea on exertion 08/01/2017   PCP NOTES >>>>>>>>>>>>>>>>>>>>>>>>> 01/01/2016   Insomnia 11/19/2013   Annual physical exam 10/01/2013   Fatigue, low vit D 08/19/2011   Erectile dysfunction 08/19/2011   Depression 01/03/2007   Human immunodeficiency virus (HIV) disease ([place]) 08/18/2006   SARCOIDOSIS 08/18/2006    Past Surgical History:  Procedure Laterality Date   LUNG SURGERY  2000   vats   TONSILLECTOMY AND ADENOIDECTOMY     ULNAR NERVE TRANSPOSITION Right  05/17/2018   Procedure: RIGHT ULNAR NERVE DECOMPRESSION/TRANSPOSITION;  Surgeon: Leanora Cover, MD;  Location: [facility];  Service: Orthopedics;  Laterality: Right;       Family History  Problem Relation Age of Onset   Hyperlipidemia Mother    Hypertension Mother    Cancer Mother        glioblastoma   Sudden death Neg Hx    Heart attack Neg Hx    Diabetes Neg Hx    Colon cancer Neg Hx    Prostate cancer Neg Hx     Social History   Tobacco Use   Smoking status: Former    Packs/day: 1.00    Years: 2.00    Pack years: 2.00    Types: Cigarettes    Quit date: 11/01/1983    Years since quitting: 37.9   Smokeless tobacco: Never  Vaping Use   Vaping Use: Never used  Substance Use Topics   Alcohol use: Yes    Alcohol/week: 1.0 standard drink    Types: 1 Standard drinks or equivalent per week    Comment: socially    Drug use: No    Home Medications Prior to Admission medications   Medication Sig Start Date End Date Taking? Authorizing Provider  omeprazole (PRILOSEC) 40 MG capsule Take 1 capsule (40 mg total) by mouth 2 (two) times daily before a meal. 09/21/21 10/21/21 Yes Varney Biles, MD  ondansetron (ZOFRAN) 4  MG tablet Take 2 tablets (8 mg total) by mouth every 8 (eight) hours as needed for nausea or vomiting. 09/21/21  Yes Varney Biles, MD  oxyCODONE-acetaminophen (PERCOCET/ROXICET) 5-325 MG tablet Take 1 tablet by mouth every 6 (six) hours as needed for severe pain. 09/21/21  Yes Varney Biles, MD  sucralfate (CARAFATE) 1 g tablet Take 1 tablet (1 g total) by mouth 4 (four) times daily -  with meals and at bedtime. 09/21/21  Yes Hulda Reddix, MD  amLODipine (NORVASC) 5 MG tablet Take 1 tablet (5 mg total) by mouth daily. 08/18/21   Colon Branch, MD  azithromycin (ZITHROMAX Z-PAK) 250 MG tablet Take 2 tablets by mouth today followed by 1 tablet a day until gone Patient not taking: Reported on 09/21/2021 07/12/21   Raylene Everts, MD   bictegravir-emtricitabine-tenofovir AF (BIKTARVY) 50-200-25 MG TABS tablet TAKE 1 TABLET BY MOUTH DAILY. 10/14/20 10/14/21  Michel Bickers, MD  citalopram (CELEXA) 20 MG tablet Take 1 tablet (20 mg total) by mouth daily. 05/10/21   Colon Branch, MD  famotidine (PEPCID) 20 MG tablet Take 20 mg by mouth 2 (two) times daily.    [provider]  fluticasone (FLONASE) 50 MCG/ACT nasal spray Place 2 sprays into both nostrils daily. Patient not taking: Reported on 09/21/2021 07/12/21   Raylene Everts, MD  ibuprofen (ADVIL) 800 MG tablet Take 1 tablet (800 mg total) by mouth every 8 (eight) hours as needed for moderate pain. Patient not taking: Reported on 09/21/2021 07/12/21   Raylene Everts, MD  Vitamin D, Ergocalciferol, (DRISDOL) 1.25 MG (50000 UNIT) CAPS capsule TAKE 1 CAPSULE BY MOUTH EVERY 7 DAYS Patient not taking: Reported on 09/21/2021 01/18/21 01/18/22  Colon Branch, MD  zolpidem (AMBIEN) 10 MG tablet TAKE 1 TABLET BY MOUTH EVERY NIGHT AT BEDTIME AS NEEDED FOR SLEEP Patient not taking: Reported on 09/21/2021 10/26/20   Colon Branch, MD    Allergies    Morphine, Doxycycline, Celebrex [celecoxib], and Wellbutrin [bupropion]  Review of Systems   Review of Systems  Constitutional:  Positive for activity change.  Respiratory:  Negative for shortness of breath.   Cardiovascular:  Negative for chest pain.  Gastrointestinal:  Positive for abdominal pain and nausea.  All other systems reviewed and are negative.  Physical Exam Updated Vital Signs BP (!) 143/87   Pulse 69   Temp 99 F (37.2 C) (Oral)   Resp 20   Ht 5\' 8"  (1.727 m)   Wt 89.4 kg   SpO2 99%   BMI 29.95 kg/m   Physical Exam Vitals and nursing note reviewed.  Constitutional:      Appearance: He is well-developed.  HENT:     Head: Atraumatic.  Cardiovascular:     Rate and Rhythm: Normal rate.  Pulmonary:     Effort: Pulmonary effort is normal.  Abdominal:     Palpations: Abdomen is soft.     Tenderness:  There is abdominal tenderness in the epigastric area. Negative signs include Murphy's sign.  Musculoskeletal:     Cervical back: Neck supple.  Skin:    General: Skin is warm.  Neurological:     Mental Status: He is alert and oriented to person, place, and time.    ED Results / Procedures / Treatments   Labs (all labs ordered are listed, but only abnormal results are displayed) Labs Reviewed  COMPREHENSIVE METABOLIC PANEL - Abnormal; Notable for the following components:      Result Value  Sodium 132 (*)    Potassium 3.2 (*)    Glucose, Bld 129 (*)    Creatinine, Ser 1.36 (*)    GFR, Estimated 59 (*)    All other components within normal limits  CBC WITH DIFFERENTIAL/PLATELET - Abnormal; Notable for the following components:   Hemoglobin 17.5 (*)    MCHC 36.1 (*)    All other components within normal limits  LIPASE, BLOOD  TROPONIN I (HIGH SENSITIVITY)    EKG EKG Interpretation  Date/Time:  Tuesday September 21 2021 11:31:18 EST Ventricular Rate:  87 PR Interval:  210 QRS Duration: 90 QT Interval:  357 QTC Calculation: 430 R Axis:   -35 Text Interpretation: Sinus rhythm Left axis deviation No acute changes No significant change since last tracing Confirmed by Varney Biles ([id number]) on 09/21/2021 11:47:49 AM  Radiology DG Chest Port 1 View  Result Date: 09/21/2021 CLINICAL DATA:  Epigastric chest pain over the last 4 days EXAM: PORTABLE CHEST 1 VIEW COMPARISON:  04/08/2019 FINDINGS: Stable postoperative findings in the right lung. Low lung volumes are present, causing crowding of the pulmonary vasculature. The lungs appear otherwise clear. Heart size within normal limits for projection. No blunting of the costophrenic angles. No significant bony abnormality identified. IMPRESSION: 1.  No active cardiopulmonary disease is radiographically apparent. 2. Stable postoperative findings in the right lung. Electronically Signed   By: Van Clines M.D.   On: 09/21/2021 12:18     Procedures Procedures   Medications Ordered in ED Medications  fentaNYL (SUBLIMAZE) injection 100 mcg (has no administration in time range)  pantoprazole (PROTONIX) injection 40 mg (has no administration in time range)  lactated ringers bolus 1,000 mL (1,000 mLs Intravenous New Bag/Given 09/21/21 1202)  ondansetron (ZOFRAN) injection 4 mg (4 mg Intravenous Given 09/21/21 1203)  fentaNYL (SUBLIMAZE) injection 75 mcg (75 mcg Intravenous Given 09/21/21 1204)  alum & mag hydroxide-simeth (MAALOX/MYLANTA) 200-200-20 MG/5ML suspension 30 mL (30 mLs Oral Given 09/21/21 1321)    ED Course  I have reviewed the triage vital signs and the nursing notes.  Pertinent labs & imaging results that were available during my care of the patient were reviewed by me and considered in my medical decision making (see chart for details).    MDM Rules/Calculators/A&P                           60 year old comes in with chief complaint of epigastric abdominal pain.  Epigastric pain has been present constantly for the past 4 days.  It is fairly constant, worse with p.o. intake.  No right upper quadrant tenderness.  Differential diagnosis includes PUD, gastroparesis, small bowel obstruction, internal hernia, cholecystitis.  Labs ordered, will reassess.  Reassessment: Lab results are reassuring.  Patient indicates that pain did improve with IV pain meds. Shared decision making made with the patient.  Plan is to get him into GI clinic soon as possible, avoid CT scan at this time.  I was able to get in touch with the [facility], Tye Savoy -she recommends patient gets omeprazole twice daily and Carafate.  She will contact the patient with an appointment.  Reassessment: Patient continues to look uncomfortable.  Wife at the bedside now.  Given the holiday weekend, there is uncertainty if patient will get prompt follow-up.  Given that he continues to look uncomfortable, we will plan to get CT scan right  now.  Patient's care will be signed out to incoming team.  If  CT is reassuring, then patient will likely be discharged with strict ER return precautions.  If CT is positive, then he will need admission.  Final Clinical Impression(s) / ED Diagnoses Final diagnoses:  Epigastric abdominal pain    Rx / DC Orders ED Discharge Orders          Ordered    omeprazole (PRILOSEC) 40 MG capsule  2 times daily before meals        09/21/21 1443    sucralfate (CARAFATE) 1 g tablet  3 times daily with meals & bedtime        09/21/21 1443    oxyCODONE-acetaminophen (PERCOCET/ROXICET) 5-325 MG tablet  Every 6 hours PRN        09/21/21 1443    ondansetron (ZOFRAN) 4 MG tablet  Every 8 hours PRN        09/21/21 1443             Varney Biles, MD 09/21/21 1524

## 2021-09-28 ENCOUNTER — Encounter: Payer: Self-pay | Admitting: Internal Medicine

## 2021-09-28 ENCOUNTER — Ambulatory Visit: Payer: 59 | Admitting: Internal Medicine

## 2021-09-28 VITALS — BP 144/66 | HR 80 | Ht 68.0 in | Wt 197.0 lb

## 2021-09-28 DIAGNOSIS — R1013 Epigastric pain: Secondary | ICD-10-CM | POA: Diagnosis not present

## 2021-09-28 DIAGNOSIS — R6881 Early satiety: Secondary | ICD-10-CM

## 2021-09-28 NOTE — Progress Notes (Signed)
FIELDS OROS 60 y.o. Mar 06, 1961 [id number]  Assessment & Plan:   Encounter Diagnoses  Name Primary?   Abdominal pain, epigastric Yes   Early satiety      Evaluate with EGD and continue current treatment with PPI twice daily and sucralfate 4 times daily.  He is making progress.  I do not have available appointments in a reasonable timeframe and he will be seen and have his EGD performed by Dr. Bryan Lemma.  The patient will follow-up with me after that.  This does not sound like a biliary problem.  Question if he has a gastritis that is getting better.  It sounds like he was much much worse after the "heavy meal "he described but was having some sort of increasing heartburn and dyspeptic symptoms for about 4 weeks from what he told me and what I saw in Dr. Ethel Rana note.  The risks and benefits as well as alternatives of endoscopic procedure(s) have been discussed and reviewed. All questions answered. The patient agrees to proceed.  He is overdue for colorectal cancer screening and will we revisit this after the acute problem is over.  I appreciate the opportunity to care for this patient. 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  Past medical history notable for HIV disease on chronic suppressive therapy, depression and significant insomnia, hypertension.  He had a very "heavy meal" the other night, at the [facility], fried chicken and other similar foods and then had fairly intense subxiphoid and epigastric pain that was sharp with also a sensation of a severe pressure there.  He saw Dr. Larose Kells and was triaged to the emergency department.  While there it took a while to get his pain under control.  Laboratory testing with CBC CMET and lipase were all okay.  He had some associated shortness of breath.  A CT of the abdomen pelvis  was unrevealing.  There was some question about whether he would be admitted but eventually felt well enough to go home.  He has been on sucralfate and twice daily PPI.  Slowly getting better.  He is only used 2 Percocet since that November 22 ED visit.  Prior to the onset of this he had felt a little bit unwell in the upper abdomen and increased some use of Pepcid.  He is on a mostly liquid diet with some soft foods like bananas.  He has returned to work.  He does not describe problems like this in the past.  Though he has improved he continues to have this pressure sensation and a lot of early satiety as well.  Wt Readings from Last 3 Encounters:  09/28/21 197 lb (89.4 kg)  09/21/21 196 lb 8 oz (89.1 kg)  07/12/21 192 lb (87.1 kg)   2010 colonoscopy in [place] negative.  He has not had 1 since and is not keen on repeating one for screening at this time.  Remote history of EGD 2002 negative, Dr. Deatra Ina.  He may have seen Dr. Henrene Pastor at some point in the past but has requested to see me. Allergies  Allergen Reactions   Morphine Other (See Comments)    Resp distress, rash, tachycardia   Doxycycline Nausea And Vomiting   Celebrex [Celecoxib] Other (See Comments)    Elevated LFT's   Wellbutrin [Bupropion] Hives   Current Meds  Medication Sig   amLODipine (NORVASC) 5 MG tablet Take 1 tablet (5 mg total) by mouth daily.   bictegravir-emtricitabine-tenofovir AF (BIKTARVY) 50-200-25 MG TABS tablet TAKE 1 TABLET BY MOUTH DAILY.   calcium carbonate (TUMS EX) 750 MG chewable tablet Chew 2 tablets by mouth. Multiple times a day   citalopram (CELEXA) 20 MG tablet Take 1 tablet (20 mg total) by mouth daily.   omeprazole (PRILOSEC) 40 MG capsule Take 1 capsule (40 mg total) by mouth 2 (two) times daily before a meal.   ondansetron (ZOFRAN) 4 MG tablet Take 2 tablets (8 mg total) by mouth every 8 (eight) hours as needed for nausea or vomiting.   oxyCODONE-acetaminophen (PERCOCET/ROXICET) 5-325 MG tablet  Take 1 tablet by mouth every 6 (six) hours as needed for severe pain.   sucralfate (CARAFATE) 1 g tablet Take 1 tablet (1 g total) by mouth 4 (four) times daily -  with meals and at bedtime.   Vitamin D, Ergocalciferol, (DRISDOL) 1.25 MG (50000 UNIT) CAPS capsule TAKE 1 CAPSULE BY MOUTH EVERY 7 DAYS (Patient taking differently: Take by mouth. Taking 3 x a week)   zolpidem (AMBIEN) 10 MG tablet TAKE 1 TABLET BY MOUTH EVERY NIGHT AT BEDTIME AS NEEDED FOR SLEEP (Patient taking differently: Take 5-10 mg by mouth at bedtime as needed. for sleep)   Past Medical History:  Diagnosis Date   COVID-19 07/2021   DEPRESSION    GERD (gastroesophageal reflux disease)    HIV DISEASE    IBS (irritable bowel syndrome)    Sarcoidosis 2000   question of   Ulnar nerve compression, right    Past Surgical History:  Procedure Laterality Date   COLONOSCOPY  2010   ESOPHAGOGASTRODUODENOSCOPY  2002   LUNG SURGERY  10/31/1998   vats   TONSILLECTOMY AND ADENOIDECTOMY     ULNAR NERVE TRANSPOSITION Right 05/17/2018   Procedure: RIGHT ULNAR NERVE DECOMPRESSION/TRANSPOSITION;  Surgeon: Leanora Cover, MD;  Location: [facility];  Service: Orthopedics;  Laterality: Right;   Social History   Social History Narrative   Married to Bruce Woods, daughters Bruce Woods , Bruce Woods\   RN PACU, ED   Former smoker no drug use occasional alcohol   family history includes Cancer in his mother; Hyperlipidemia in his mother; Hypertension in his mother.   Review of Systems See HPI otherwise negative  Objective:   Physical Exam @BP  (!) 144/66   Pulse 80   Ht 5\' 8"  (1.727 m)   Wt 197 lb (89.4 kg)   SpO2 98%   BMI 29.95 kg/m @  General:  Well-developed, well-nourished and in no acute distress Eyes:  anicteric. Lungs: Clear to auscultation bilaterally. Heart:   S1S2, no rubs, murmurs, gallops. Abdomen:  soft, non-tender, no hepatosplenomegaly, hernia, or mass and BS+. Negative Carnett's  Neuro:  A&O x 3.   Psych:  appropriate mood and  Affect.   Data Reviewed: See HPI but I reviewed primary care notes from 09/21/2021 in the ED notes labs imaging

## 2021-09-28 NOTE — Patient Instructions (Signed)
You have been scheduled for an endoscopy. Please follow written instructions given to you at your visit today. If you use inhalers (even only as needed), please bring them with you on the day of your procedure.  If you are age 62 or older, your body mass index should be between 23-30. Your Body mass index is 29.95 kg/m. If this is out of the aforementioned range listed, please consider follow up with your Primary Care Provider.  If you are age 55 or younger, your body mass index should be between 19-25. Your Body mass index is 29.95 kg/m. If this is out of the aformentioned range listed, please consider follow up with your Primary Care Provider.   ________________________________________________________  The  GI providers would like to encourage you to use [facility] to communicate with providers for non-urgent requests or questions.  Due to long hold times on the telephone, sending your provider a message by [facility] may be a faster and more efficient way to get a response.  Please allow 48 business hours for a response.  Please remember that this is for non-urgent requests.  _______________________________________________________   I appreciate the opportunity to care for you. Silvano Rusk, MD, [facility]

## 2021-10-04 ENCOUNTER — Other Ambulatory Visit: Payer: Self-pay | Admitting: Internal Medicine

## 2021-10-04 ENCOUNTER — Other Ambulatory Visit (HOSPITAL_COMMUNITY): Payer: Self-pay

## 2021-10-04 DIAGNOSIS — B20 Human immunodeficiency virus [HIV] disease: Secondary | ICD-10-CM

## 2021-10-04 MED ORDER — BIKTARVY 50-200-25 MG PO TABS
1.0000 | ORAL_TABLET | Freq: Every day | ORAL | 0 refills | Status: DC
  Filled 2021-10-04: qty 30, 30d supply, fill #0

## 2021-10-05 ENCOUNTER — Other Ambulatory Visit: Payer: Self-pay

## 2021-10-05 ENCOUNTER — Other Ambulatory Visit: Payer: 59

## 2021-10-05 DIAGNOSIS — B20 Human immunodeficiency virus [HIV] disease: Secondary | ICD-10-CM | POA: Diagnosis not present

## 2021-10-06 ENCOUNTER — Encounter: Payer: Self-pay | Admitting: Internal Medicine

## 2021-10-06 LAB — T-HELPER CELL (CD4) - (RCID CLINIC ONLY)
CD4 % Helper T Cell: 21 % — ABNORMAL LOW (ref 33–65)
CD4 T Cell Abs: 415 /uL (ref 400–1790)

## 2021-10-07 ENCOUNTER — Other Ambulatory Visit (HOSPITAL_COMMUNITY): Payer: Self-pay

## 2021-10-08 LAB — RPR: RPR Ser Ql: NONREACTIVE

## 2021-10-08 LAB — COMPREHENSIVE METABOLIC PANEL
AG Ratio: 1.6 (calc) (ref 1.0–2.5)
ALT: 17 U/L (ref 9–46)
AST: 18 U/L (ref 10–35)
Albumin: 4.6 g/dL (ref 3.6–5.1)
Alkaline phosphatase (APISO): 63 U/L (ref 35–144)
BUN: 13 mg/dL (ref 7–25)
CO2: 32 mmol/L (ref 20–32)
Calcium: 9.8 mg/dL (ref 8.6–10.3)
Chloride: 102 mmol/L (ref 98–110)
Creat: 1.29 mg/dL (ref 0.70–1.35)
Globulin: 2.8 g/dL (calc) (ref 1.9–3.7)
Glucose, Bld: 100 mg/dL — ABNORMAL HIGH (ref 65–99)
Potassium: 3.9 mmol/L (ref 3.5–5.3)
Sodium: 141 mmol/L (ref 135–146)
Total Bilirubin: 1 mg/dL (ref 0.2–1.2)
Total Protein: 7.4 g/dL (ref 6.1–8.1)

## 2021-10-08 LAB — CBC
HCT: 48 % (ref 38.5–50.0)
Hemoglobin: 16.9 g/dL (ref 13.2–17.1)
MCH: 32.8 pg (ref 27.0–33.0)
MCHC: 35.2 g/dL (ref 32.0–36.0)
MCV: 93 fL (ref 80.0–100.0)
MPV: 10.9 fL (ref 7.5–12.5)
Platelets: 208 10*3/uL (ref 140–400)
RBC: 5.16 10*6/uL (ref 4.20–5.80)
RDW: 13.6 % (ref 11.0–15.0)
WBC: 6.4 10*3/uL (ref 3.8–10.8)

## 2021-10-08 LAB — LIPID PANEL
Cholesterol: 164 mg/dL (ref <200)
HDL: 33 mg/dL — ABNORMAL LOW (ref >=40)
LDL Cholesterol (Calc): 103 mg/dL (calc) — ABNORMAL HIGH
Non-HDL Cholesterol (Calc): 131 mg/dL (calc) — ABNORMAL HIGH (ref <130)
Total CHOL/HDL Ratio: 5 (calc) — ABNORMAL HIGH (ref <5.0)
Triglycerides: 162 mg/dL — ABNORMAL HIGH (ref <150)

## 2021-10-08 LAB — HIV-1 RNA QUANT-NO REFLEX-BLD
HIV 1 RNA Quant: NOT DETECTED Copies/mL
HIV-1 RNA Quant, Log: NOT DETECTED Log cps/mL

## 2021-10-11 ENCOUNTER — Encounter: Payer: Self-pay | Admitting: Gastroenterology

## 2021-10-11 ENCOUNTER — Other Ambulatory Visit: Payer: Self-pay

## 2021-10-11 ENCOUNTER — Other Ambulatory Visit (HOSPITAL_BASED_OUTPATIENT_CLINIC_OR_DEPARTMENT_OTHER): Payer: Self-pay

## 2021-10-11 ENCOUNTER — Ambulatory Visit (AMBULATORY_SURGERY_CENTER): Payer: 59 | Admitting: Gastroenterology

## 2021-10-11 VITALS — BP 126/83 | HR 58 | Temp 98.0°F | Resp 12 | Ht 68.0 in | Wt 197.0 lb

## 2021-10-11 DIAGNOSIS — K2970 Gastritis, unspecified, without bleeding: Secondary | ICD-10-CM | POA: Diagnosis not present

## 2021-10-11 DIAGNOSIS — K5710 Diverticulosis of small intestine without perforation or abscess without bleeding: Secondary | ICD-10-CM | POA: Diagnosis not present

## 2021-10-11 DIAGNOSIS — K2990 Gastroduodenitis, unspecified, without bleeding: Secondary | ICD-10-CM | POA: Diagnosis not present

## 2021-10-11 DIAGNOSIS — K222 Esophageal obstruction: Secondary | ICD-10-CM

## 2021-10-11 DIAGNOSIS — R1013 Epigastric pain: Secondary | ICD-10-CM

## 2021-10-11 DIAGNOSIS — K219 Gastro-esophageal reflux disease without esophagitis: Secondary | ICD-10-CM | POA: Diagnosis not present

## 2021-10-11 DIAGNOSIS — K2100 Gastro-esophageal reflux disease with esophagitis, without bleeding: Secondary | ICD-10-CM | POA: Diagnosis not present

## 2021-10-11 DIAGNOSIS — K2950 Unspecified chronic gastritis without bleeding: Secondary | ICD-10-CM | POA: Diagnosis not present

## 2021-10-11 DIAGNOSIS — I10 Essential (primary) hypertension: Secondary | ICD-10-CM | POA: Diagnosis not present

## 2021-10-11 DIAGNOSIS — R6881 Early satiety: Secondary | ICD-10-CM | POA: Diagnosis not present

## 2021-10-11 DIAGNOSIS — K449 Diaphragmatic hernia without obstruction or gangrene: Secondary | ICD-10-CM | POA: Diagnosis not present

## 2021-10-11 MED ORDER — PANTOPRAZOLE SODIUM 40 MG PO TBEC
40.0000 mg | DELAYED_RELEASE_TABLET | Freq: Two times a day (BID) | ORAL | 1 refills | Status: DC
  Filled 2021-10-11: qty 60, 30d supply, fill #0

## 2021-10-11 MED ORDER — SODIUM CHLORIDE 0.9 % IV SOLN: 500.0000 mL | INTRAVENOUS | Status: DC

## 2021-10-11 NOTE — Progress Notes (Signed)
Report to PACU, RN, vss, BBS= Clear.  

## 2021-10-11 NOTE — Progress Notes (Signed)
GASTROENTEROLOGY PROCEDURE H&P NOTE   Primary Care Physician: Colon Branch, MD    Reason for Procedure:  Epigastric pain  Plan:    EGD with biopsies  Patient is appropriate for endoscopic procedure(s) in the ambulatory ([facility]) setting.  The nature of the procedure, as well as the risks, benefits, and alternatives were carefully and thoroughly reviewed with the patient. Ample time for discussion and questions allowed. The patient understood, was satisfied, and agreed to proceed.     HPI: Bruce Woods is a 60 y.o. male who presents for EGD for evaluation of epigastric pain, early satiety.  Patient was most recently seen in the Gastroenterology Clinic on 09/28/2021 by Dr. Carlean Purl.  No interval change in medical history since that appointment. Please refer to that note for full details regarding GI history and clinical presentation.   Past Medical History:  Diagnosis Date   COVID-19 07/2021   DEPRESSION    GERD (gastroesophageal reflux disease)    HIV DISEASE    IBS (irritable bowel syndrome)    Sarcoidosis 2000   question of   Ulnar nerve compression, right     Past Surgical History:  Procedure Laterality Date   COLONOSCOPY  2010   ESOPHAGOGASTRODUODENOSCOPY  2002   LUNG SURGERY  10/31/1998   vats   TONSILLECTOMY AND ADENOIDECTOMY     ULNAR NERVE TRANSPOSITION Right 05/17/2018   Procedure: RIGHT ULNAR NERVE DECOMPRESSION/TRANSPOSITION;  Surgeon: Leanora Cover, MD;  Location: [facility];  Service: Orthopedics;  Laterality: Right;    Prior to Admission medications   Medication Sig Start Date End Date Taking? Authorizing Provider  amLODipine (NORVASC) 5 MG tablet Take 1 tablet (5 mg total) by mouth daily. 08/18/21  Yes Paz, Alda Berthold, MD  bictegravir-emtricitabine-tenofovir AF (BIKTARVY) 50-200-25 MG TABS tablet TAKE 1 TABLET BY MOUTH DAILY. 10/04/21 10/04/22 Yes Michel Bickers, MD  calcium carbonate (TUMS EX) 750 MG chewable tablet Chew 2 tablets by mouth.  Multiple times a day   Yes [provider]  citalopram (CELEXA) 20 MG tablet Take 1 tablet (20 mg total) by mouth daily. 05/10/21  Yes Paz, Alda Berthold, MD  omeprazole (PRILOSEC) 40 MG capsule Take 1 capsule (40 mg total) by mouth 2 (two) times daily before a meal. 09/21/21 10/21/21 Yes Nanavati, Ankit, MD  ondansetron (ZOFRAN) 4 MG tablet Take 2 tablets (8 mg total) by mouth every 8 (eight) hours as needed for nausea or vomiting. 09/21/21  Yes Varney Biles, MD  sucralfate (CARAFATE) 1 g tablet Take 1 tablet (1 g total) by mouth 4 (four) times daily -  with meals and at bedtime. 09/21/21  Yes Nanavati, Ankit, MD  zolpidem (AMBIEN) 10 MG tablet TAKE 1 TABLET BY MOUTH EVERY NIGHT AT BEDTIME AS NEEDED FOR SLEEP Patient taking differently: Take 5-10 mg by mouth at bedtime as needed. for sleep 10/26/20   Colon Branch, MD    Current Outpatient Medications  Medication Sig Dispense Refill   amLODipine (NORVASC) 5 MG tablet Take 1 tablet (5 mg total) by mouth daily. 90 tablet 2   bictegravir-emtricitabine-tenofovir AF (BIKTARVY) 50-200-25 MG TABS tablet TAKE 1 TABLET BY MOUTH DAILY. 30 tablet 0   calcium carbonate (TUMS EX) 750 MG chewable tablet Chew 2 tablets by mouth. Multiple times a day     citalopram (CELEXA) 20 MG tablet Take 1 tablet (20 mg total) by mouth daily. 90 tablet 2   omeprazole (PRILOSEC) 40 MG capsule Take 1 capsule (40 mg total) by mouth 2 (  two) times daily before a meal. 60 capsule 0   ondansetron (ZOFRAN) 4 MG tablet Take 2 tablets (8 mg total) by mouth every 8 (eight) hours as needed for nausea or vomiting. 12 tablet 0   sucralfate (CARAFATE) 1 g tablet Take 1 tablet (1 g total) by mouth 4 (four) times daily -  with meals and at bedtime. 90 tablet 0   zolpidem (AMBIEN) 10 MG tablet TAKE 1 TABLET BY MOUTH EVERY NIGHT AT BEDTIME AS NEEDED FOR SLEEP (Patient taking differently: Take 5-10 mg by mouth at bedtime as needed. for sleep) 30 tablet 1   Current Facility-Administered  Medications  Medication Dose Route Frequency Provider Last Rate Last Admin   0.9 %  sodium chloride infusion  500 mL Intravenous Continuous Star Resler V, DO        Allergies as of 10/11/2021 - Review Complete 10/11/2021  Allergen Reaction Noted   Morphine Other (See Comments) 08/18/2006   Doxycycline Nausea And Vomiting 08/18/2006   Celebrex [celecoxib] Other (See Comments) 04/19/2011   Wellbutrin [bupropion] Hives 11/12/2013    Family History  Problem Relation Age of Onset   Hyperlipidemia Mother    Hypertension Mother    Cancer Mother        glioblastoma   Sudden death Neg Hx    Heart attack Neg Hx    Diabetes Neg Hx    Colon cancer Neg Hx    Prostate cancer Neg Hx     Social History   Socioeconomic History   Marital status: Married    Spouse name: Not on file   Number of children: 2   Years of education: Not on file   Highest education level: Not on file  Occupational History   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: [employer]  Tobacco Use   Smoking status: Former    Packs/day: 1.00    Years: 2.00    Pack years: 2.00    Types: Cigarettes    Quit date: 11/01/1983    Years since quitting: 37.9   Smokeless tobacco: Never  Vaping Use   Vaping Use: Never used  Substance and Sexual Activity   Alcohol use: Yes    Alcohol/week: 1.0 standard drink    Types: 1 Standard drinks or equivalent per week    Comment: socially    Drug use: No   Sexual activity: Yes    Comment: declined condoms  Other Topics Concern   Not on file  Social History Narrative   Married to [facility], daughters Programmer, applications , Christen\   RN PACU, ED   Former smoker no drug use occasional alcohol   Social Determinants of Radio broadcast assistant Strain: Not on file  Food Insecurity: Not on file  Transportation Needs: Not on file  Physical Activity: Not on file  Stress: Not on file  Social Connections: Not on file  Intimate Partner Violence: Not on file    Physical  Exam: Vital signs in last 24 hours: @BP  (!) 150/80   Pulse (!) 58   Temp 98 F (36.7 C) (Temporal)   Ht 5\' 8"  (1.727 m)   Wt 197 lb (89.4 kg)   SpO2 97%   BMI 29.95 kg/m  GEN: NAD EYE: Sclerae anicteric ENT: MMM CV: Non-tachycardic Pulm: CTA b/l GI: Soft, NT/ND NEURO:  Alert & Oriented x 3   Gerrit Heck, DO [facility]   10/11/2021 7:52 AM

## 2021-10-11 NOTE — Progress Notes (Signed)
Pt's states no medical or surgical changes since previsit or office visit. 

## 2021-10-11 NOTE — Progress Notes (Signed)
Called to room to assist during endoscopic procedure.  Patient ID and intended procedure confirmed with present staff. Received instructions for my participation in the procedure from the performing physician.  

## 2021-10-11 NOTE — Patient Instructions (Signed)
Discharge instructions given. Handouts on Gastritis and hiatal hernia. Prescription sent to pharmacy. Resume previous medications. YOU HAD AN ENDOSCOPIC PROCEDURE TODAY AT [facility]:   Refer to the procedure report that was given to you for any specific questions about what was found during the examination.  If the procedure report does not answer your questions, please call your gastroenterologist to clarify.  If you requested that your care partner not be given the details of your procedure findings, then the procedure report has been included in a sealed envelope for you to review at your convenience later.  YOU SHOULD EXPECT: Some feelings of bloating in the abdomen. Passage of more gas than usual.  Walking can help get rid of the air that was put into your GI tract during the procedure and reduce the bloating. If you had a lower endoscopy (such as a colonoscopy or flexible sigmoidoscopy) you may notice spotting of blood in your stool or on the toilet paper. If you underwent a bowel prep for your procedure, you may not have a normal bowel movement for a few days.  Please Note:  You might notice some irritation and congestion in your nose or some drainage.  This is from the oxygen used during your procedure.  There is no need for concern and it should clear up in a day or so.  SYMPTOMS TO REPORT IMMEDIATELY:   Following upper endoscopy (EGD)  Vomiting of blood or coffee ground material  New chest pain or pain under the shoulder blades  Painful or persistently difficult swallowing  New shortness of breath  Fever of 100F or higher  Black, tarry-looking stools  For urgent or emergent issues, a gastroenterologist can be reached at any hour by calling [phone]. Do not use MyChart messaging for urgent concerns.    DIET:  We do recommend a small meal at first, but then you may proceed to your regular diet.  Drink plenty of fluids but you should avoid alcoholic beverages  for 24 hours.  ACTIVITY:  You should plan to take it easy for the rest of today and you should NOT DRIVE or use heavy machinery until tomorrow (because of the sedation medicines used during the test).    FOLLOW UP: Our staff will call the number listed on your records 48-72 hours following your procedure to check on you and address any questions or concerns that you may have regarding the information given to you following your procedure. If we do not reach you, we will leave a message.  We will attempt to reach you two times.  During this call, we will ask if you have developed any symptoms of COVID 19. If you develop any symptoms (ie: fever, flu-like symptoms, shortness of breath, cough etc.) before then, please call [phone].  If you test positive for Covid 19 in the 2 weeks post procedure, please call and report this information to [place].    If any biopsies were taken you will be contacted by phone or by letter within the next 1-3 weeks.  Please call us at [phone] if you have not heard about the biopsies in 3 weeks.    SIGNATURES/CONFIDENTIALITY: You and/or your care partner have signed paperwork which will be entered into your electronic medical record.  These signatures attest to the fact that that the information above on your After Visit Summary has been reviewed and is understood.  Full responsibility of the confidentiality of this discharge information lies with you and/or  your care-partner.

## 2021-10-11 NOTE — Op Note (Signed)
[facility] Patient Name: Bruce Woods Procedure Date: 10/11/2021 7:45 AM MRN: [id number] Endoscopist: Gerrit Heck , MD Age: 60 Referring MD:  Date of Birth: 09-22-1961 Gender: Male Account #: [id number] Procedure:                Upper GI endoscopy Indications:              Epigastric abdominal pain, Early satiety                           61 yo male with recent onset MEG/subxiphoid pain                            with associated early satiety and belching.                            Continued symptoms despite high-dose PPI                            (omeprazole 40 mg BID) and sucralfate. Recent                            evaluation was unrevealing to include CBC, CMP,                            lipase, CT abdomen/pelvis. Presents today for                            endoscopic evaluation. Medicines:                Monitored Anesthesia Care Procedure:                Pre-Anesthesia Assessment:                           - Prior to the procedure, a History and Physical                            was performed, and patient medications and                            allergies were reviewed. The patient's tolerance of                            previous anesthesia was also reviewed. The risks                            and benefits of the procedure and the sedation                            options and risks were discussed with the patient.                            All questions were answered, and informed consent  was obtained. Prior Anticoagulants: The patient has                            taken no previous anticoagulant or antiplatelet                            agents. ASA Grade Assessment: III - A patient with                            severe systemic disease. After reviewing the risks                            and benefits, the patient was deemed in                            satisfactory condition to undergo the procedure.                            After obtaining informed consent, the endoscope was                            passed under direct vision. Throughout the                            procedure, the patient's blood pressure, pulse, and                            oxygen saturations were monitored continuously. The                            GIF [id number] #[id number] was introduced through the                            mouth, and advanced to the second part of duodenum.                            The upper GI endoscopy was accomplished without                            difficulty. The patient tolerated the procedure                            well. Scope In: Scope Out: Findings:                 LA Grade A (one or more mucosal breaks less than 5                            mm, not extending between tops of 2 mucosal folds)                            esophagitis with no bleeding was found in the lower  third of the esophagus. Biopsies were taken with a                            cold forceps for histology. Estimated blood loss                            was minimal.                           A non-obstructing Schatzki ring was found in the                            lower third of the esophagus. Biopsies were taken                            with a cold forceps for dual purposes of histologic                            evaluation (as above) and fracturing of the ring.                            Estimated blood loss was minimal.                           A 3 cm hiatal hernia was present.                           The gastroesophageal flap valve was visualized                            endoscopically and classified as Hill Grade III                            (minimal fold, loose to endoscope, hiatal hernia                            likely).                           Scattered mild inflammation characterized by                            erythema was found in the gastric body and in the                             gastric antrum. Biopsies were taken with a cold                            forceps for Helicobacter pylori testing. Estimated                            blood loss was minimal.                           A  medium non-bleeding diverticulum was found in the                            second portion of the duodenum.                           Normal mucosa was found in the duodenal bulb, in                            the first portion of the duodenum and in the second                            portion of the duodenum. Biopsies were taken with a                            cold forceps for histology. Estimated blood loss                            was minimal. Complications:            No immediate complications. Estimated Blood Loss:     Estimated blood loss was minimal. Impression:               - LA Grade A reflux esophagitis with no bleeding.                            Biopsied.                           - Non-obstructing Schatzki ring. Biopsied.                           - 3 cm hiatal hernia.                           - Gastroesophageal flap valve classified as Hill                            Grade III (minimal fold, loose to endoscope, hiatal                            hernia likely).                           - Gastritis. Biopsied.                           - Non-bleeding duodenal diverticulum.                           - Normal mucosa was found in the duodenal bulb, in                            the first portion of the duodenum and in the second  portion of the duodenum. Biopsied. Recommendation:           - Patient has a contact number available for                            emergencies. The signs and symptoms of potential                            delayed complications were discussed with the                            patient. Return to normal activities tomorrow.                            Written discharge instructions were provided  to the                            patient.                           - Resume previous diet.                           - Continue present medications.                           - Await pathology results.                           - Stop Prilosec and change to Protonix                            (pantoprazole) 40 mg PO BID for 6 weeks. If still                            suboptimal response, will consider either changing                            to Aciphex and additional evaluation in the office.                           - Follow-up with Dr. Carlean Purl in the GI clinic at                            appointment to be scheduled. Gerrit Heck, MD 10/11/2021 8:19:54 AM

## 2021-10-13 ENCOUNTER — Telehealth: Payer: Self-pay

## 2021-10-13 ENCOUNTER — Telehealth: Payer: Self-pay | Admitting: *Deleted

## 2021-10-13 NOTE — Telephone Encounter (Signed)
Per 10/11/22 procedure report - Follow up with Dr. Carlean Purl in the GI clinic at appt to be scheduled.  Called and spoke with patient to set up appt. Patient has been scheduled for a follow up with Dr. Carlean Purl on Tuesday, 11/23/21 at 2:30 pm. Pt had no concerns at the end of the call.

## 2021-10-13 NOTE — Telephone Encounter (Signed)
°  Follow up Call-  Call back number 10/11/2021  Post procedure Call Back phone  # [phone]  Permission to leave phone message Yes  Some recent data might be hidden     Patient questions:  Message left to call us if necessary.

## 2021-10-13 NOTE — Telephone Encounter (Signed)
°  Follow up Call-  Call back number 10/11/2021  Post procedure Call Back phone  # [phone]  Permission to leave phone message Yes  Some recent data might be hidden     Patient questions:  Do you have a fever, pain , or abdominal swelling? No. Pain Score  0 *  Have you tolerated food without any problems? Yes.    Have you been able to return to your normal activities? Yes.    Do you have any questions about your discharge instructions: Diet   No. Medications  No. Follow up visit  No.  Do you have questions or concerns about your Care? No.  Actions: * If pain score is 4 or above: No action needed, pain <4.

## 2021-10-19 ENCOUNTER — Ambulatory Visit (INDEPENDENT_AMBULATORY_CARE_PROVIDER_SITE_OTHER): Payer: 59 | Admitting: Internal Medicine

## 2021-10-19 ENCOUNTER — Encounter: Payer: Self-pay | Admitting: Internal Medicine

## 2021-10-19 ENCOUNTER — Other Ambulatory Visit: Payer: Self-pay

## 2021-10-19 ENCOUNTER — Other Ambulatory Visit (HOSPITAL_BASED_OUTPATIENT_CLINIC_OR_DEPARTMENT_OTHER): Payer: Self-pay

## 2021-10-19 DIAGNOSIS — B20 Human immunodeficiency virus [HIV] disease: Secondary | ICD-10-CM | POA: Diagnosis not present

## 2021-10-19 MED ORDER — BIKTARVY 50-200-25 MG PO TABS
1.0000 | ORAL_TABLET | Freq: Every day | ORAL | 0 refills | Status: DC
  Filled 2021-10-19 – 2021-11-10 (×2): qty 30, 30d supply, fill #0

## 2021-10-19 NOTE — Progress Notes (Signed)
Patient Active Problem List   Diagnosis Date Noted   Depression 01/03/2007    Priority: High   Human immunodeficiency virus (HIV) disease ([place]) 08/18/2006    Priority: High   Hypertension, essential 10/27/2020   Anxiety 10/14/2020   Elevated blood pressure reading 10/14/2020   Medial epicondylitis of elbow, right 04/20/2020   Dyspnea on exertion 08/01/2017   PCP NOTES >>>>>>>>>>>>>>>>>>>>>>>>> 01/01/2016   Insomnia 11/19/2013   Annual physical exam 10/01/2013   Fatigue, low vit D 08/19/2011   Erectile dysfunction 08/19/2011   SARCOIDOSIS 08/18/2006    Patient's Medications  New Prescriptions   No medications on file  Previous Medications   AMLODIPINE (NORVASC) 5 MG TABLET    Take 1 tablet (5 mg total) by mouth daily.   CALCIUM CARBONATE (TUMS EX) 750 MG CHEWABLE TABLET    Chew 2 tablets by mouth. Multiple times a day   CITALOPRAM (CELEXA) 20 MG TABLET    Take 1 tablet (20 mg total) by mouth daily.   METHOCARBAMOL (ROBAXIN) 500 MG TABLET       ONDANSETRON (ZOFRAN) 4 MG TABLET    Take 2 tablets (8 mg total) by mouth every 8 (eight) hours as needed for nausea or vomiting.   PANTOPRAZOLE (PROTONIX) 40 MG TABLET    Take 1 tablet (40 mg total) by mouth 2 (two) times daily.   SUCRALFATE (CARAFATE) 1 G TABLET    Take 1 tablet (1 g total) by mouth 4 (four) times daily -  with meals and at bedtime.   ZOLPIDEM (AMBIEN) 10 MG TABLET    TAKE 1 TABLET BY MOUTH EVERY NIGHT AT BEDTIME AS NEEDED FOR SLEEP  Modified Medications   Modified Medication Previous Medication   BICTEGRAVIR-EMTRICITABINE-TENOFOVIR AF (BIKTARVY) 50-200-25 MG TABS TABLET bictegravir-emtricitabine-tenofovir AF (BIKTARVY) 50-200-25 MG TABS tablet      TAKE 1 TABLET BY MOUTH DAILY.    TAKE 1 TABLET BY MOUTH DAILY.  Discontinued Medications   No medications on file    Subjective: Bruce Woods is in for his routine HIV follow-up visit.  He has not had any problems obtaining, taking or tolerating his Biktarvy and  never misses a single dose.  He has been struggling with severe GERD and was recently diagnosed with a hiatal hernia.  He is having dysphagia with solid food.  He is due to follow-up with Dr. Carlean Purl soon.  He does not have any problems swallowing his pills.  He has had his annual influenza vaccine but is not up-to-date on his COVID booster vaccine.  Review of Systems: Review of Systems  Constitutional:  Negative for fever.  Gastrointestinal:  Positive for abdominal pain, heartburn and nausea. Negative for diarrhea and vomiting.   Past Medical History:  Diagnosis Date   COVID-19 07/2021   DEPRESSION    GERD (gastroesophageal reflux disease)    HIV DISEASE    IBS (irritable bowel syndrome)    Sarcoidosis 2000   question of   Ulnar nerve compression, right     Social History   Tobacco Use   Smoking status: Former    Packs/day: 1.00    Years: 2.00    Pack years: 2.00    Types: Cigarettes    Quit date: 11/01/1983    Years since quitting: 37.9   Smokeless tobacco: Never  Vaping Use   Vaping Use: Never used  Substance Use Topics   Alcohol use: Yes    Alcohol/week: 1.0 standard drink    Types:  1 Standard drinks or equivalent per week    Comment: socially    Drug use: No    Family History  Problem Relation Age of Onset   Hyperlipidemia Mother    Hypertension Mother    Cancer Mother        glioblastoma   Sudden death Neg Hx    Heart attack Neg Hx    Diabetes Neg Hx    Colon cancer Neg Hx    Prostate cancer Neg Hx     Allergies  Allergen Reactions   Morphine Other (See Comments)    Resp distress, rash, tachycardia   Doxycycline Nausea And Vomiting   Celebrex [Celecoxib] Other (See Comments)    Elevated LFT's   Wellbutrin [Bupropion] Hives    Health Maintenance  Topic Date Due   Zoster Vaccines- Shingrix (1 of 2) Never done   COVID-19 Vaccine (4 - Booster for Pfizer series) 01/13/2022 (Originally 08/24/2020)   COLONOSCOPY (Pts 45-37yrs Insurance coverage will  need to be confirmed)  01/14/2022 (Originally 02/06/2004)   Pneumococcal Vaccine 10-63 Years old (4 - PPSV23 if available, else PCV20) 02/06/2024   TETANUS/TDAP  01/03/2027   INFLUENZA VACCINE  Completed   Hepatitis C Screening  Completed   HIV Screening  Completed   HPV VACCINES  Aged Out    Objective:  Vitals:   10/19/21 1456  BP: 139/87  Pulse: 64  Temp: 97.9 F (36.6 C)  TempSrc: Oral  SpO2: 99%  Weight: 197 lb (89.4 kg)  Height: 5\' 8"  (1.727 m)   Body mass index is 29.95 kg/m.  Physical Exam Constitutional:      Comments: He is talkative and in good spirits as usual.  He belches frequently during the exam.  Cardiovascular:     Rate and Rhythm: Normal rate.  Pulmonary:     Effort: Pulmonary effort is normal.  Psychiatric:        Mood and Affect: Mood normal.    Lab Results Lab Results  Component Value Date   WBC 6.4 10/05/2021   HGB 16.9 10/05/2021   HCT 48.0 10/05/2021   MCV 93.0 10/05/2021   PLT 208 10/05/2021    Lab Results  Component Value Date   CREATININE 1.29 10/05/2021   BUN 13 10/05/2021   NA 141 10/05/2021   K 3.9 10/05/2021   CL 102 10/05/2021   CO2 32 10/05/2021    Lab Results  Component Value Date   ALT 17 10/05/2021   AST 18 10/05/2021   ALKPHOS 71 09/21/2021   BILITOT 1.0 10/05/2021    Lab Results  Component Value Date   CHOL 164 10/05/2021   HDL 33 (L) 10/05/2021   LDLCALC 103 (H) 10/05/2021   LDLDIRECT 115.0 01/07/2019   TRIG 162 (H) 10/05/2021   CHOLHDL 5.0 (H) 10/05/2021   Lab Results  Component Value Date   LABRPR NON-REACTIVE 10/05/2021   HIV 1 RNA Quant  Date Value  10/05/2021 Not Detected Copies/mL  09/29/2020 32 Copies/mL (H)  10/08/2019 <20 NOT DETECTED copies/mL   CD4 T Cell Abs (/uL)  Date Value  10/05/2021 415  09/29/2020 435  10/08/2019 418     Problem List Items Addressed This Visit       High   Human immunodeficiency virus (HIV) disease ([place])    His infection remains under excellent, long-term  control.  He will continue Biktarvy and follow-up after lab work in 1 year.  He will get his COVID booster vaccine at work tomorrow.  Relevant Medications   bictegravir-emtricitabine-tenofovir AF (BIKTARVY) 50-200-25 MG TABS tablet   Other Relevant Orders   CBC   T-helper cell (CD4)- ([facility] clinic only)   Comprehensive metabolic panel   Lipid panel   RPR   HIV-1 RNA quant-no reflex-bld      Michel Bickers, MD [facility] [place] [phone] pager   [phone] cell 10/19/2021, 3:21 PM

## 2021-10-19 NOTE — Assessment & Plan Note (Signed)
His infection remains under excellent, long-term control.  He will continue Biktarvy and follow-up after lab work in 1 year.  He will get his COVID booster vaccine at work tomorrow.

## 2021-10-20 DIAGNOSIS — C44319 Basal cell carcinoma of skin of other parts of face: Secondary | ICD-10-CM | POA: Diagnosis not present

## 2021-10-26 ENCOUNTER — Other Ambulatory Visit (HOSPITAL_BASED_OUTPATIENT_CLINIC_OR_DEPARTMENT_OTHER): Payer: Self-pay

## 2021-10-26 ENCOUNTER — Telehealth: Payer: Self-pay | Admitting: Internal Medicine

## 2021-10-26 MED ORDER — ZOLPIDEM TARTRATE 10 MG PO TABS
ORAL_TABLET | Freq: Every evening | ORAL | 1 refills | Status: DC | PRN
  Filled 2021-10-26: qty 30, 30d supply, fill #0
  Filled 2022-01-12: qty 30, 30d supply, fill #1

## 2021-10-26 MED ORDER — SILDENAFIL CITRATE 20 MG PO TABS
ORAL_TABLET | ORAL | 3 refills | Status: DC
  Filled 2021-10-26: qty 30, 8d supply, fill #0

## 2021-10-26 NOTE — Telephone Encounter (Signed)
Also refill request for sildenafil- no longer on med list. Please advise.

## 2021-10-26 NOTE — Telephone Encounter (Signed)
PDMP okay, both prescriptions sent

## 2021-10-26 NOTE — Telephone Encounter (Signed)
Requesting: Ambien 10mg  Contract:  None UDS: None Last Visit: 09/21/2021 Next Visit: None Last Refill: 10/26/2020 #30 and 0RF  Please Advise

## 2021-10-28 ENCOUNTER — Encounter: Payer: Self-pay | Admitting: Gastroenterology

## 2021-11-03 ENCOUNTER — Other Ambulatory Visit (HOSPITAL_BASED_OUTPATIENT_CLINIC_OR_DEPARTMENT_OTHER): Payer: Self-pay

## 2021-11-03 ENCOUNTER — Other Ambulatory Visit (HOSPITAL_COMMUNITY): Payer: Self-pay

## 2021-11-04 DIAGNOSIS — L578 Other skin changes due to chronic exposure to nonionizing radiation: Secondary | ICD-10-CM | POA: Diagnosis not present

## 2021-11-04 DIAGNOSIS — D1801 Hemangioma of skin and subcutaneous tissue: Secondary | ICD-10-CM | POA: Diagnosis not present

## 2021-11-04 DIAGNOSIS — Z23 Encounter for immunization: Secondary | ICD-10-CM | POA: Diagnosis not present

## 2021-11-04 DIAGNOSIS — L814 Other melanin hyperpigmentation: Secondary | ICD-10-CM | POA: Diagnosis not present

## 2021-11-04 DIAGNOSIS — D229 Melanocytic nevi, unspecified: Secondary | ICD-10-CM | POA: Diagnosis not present

## 2021-11-04 DIAGNOSIS — L821 Other seborrheic keratosis: Secondary | ICD-10-CM | POA: Diagnosis not present

## 2021-11-10 ENCOUNTER — Other Ambulatory Visit (HOSPITAL_COMMUNITY): Payer: Self-pay

## 2021-11-16 DIAGNOSIS — K2970 Gastritis, unspecified, without bleeding: Secondary | ICD-10-CM | POA: Diagnosis not present

## 2021-11-16 DIAGNOSIS — K2950 Unspecified chronic gastritis without bleeding: Secondary | ICD-10-CM | POA: Diagnosis not present

## 2021-11-16 DIAGNOSIS — K219 Gastro-esophageal reflux disease without esophagitis: Secondary | ICD-10-CM | POA: Diagnosis not present

## 2021-11-16 DIAGNOSIS — K449 Diaphragmatic hernia without obstruction or gangrene: Secondary | ICD-10-CM | POA: Diagnosis not present

## 2021-11-19 ENCOUNTER — Other Ambulatory Visit (HOSPITAL_BASED_OUTPATIENT_CLINIC_OR_DEPARTMENT_OTHER): Payer: Self-pay

## 2021-11-23 ENCOUNTER — Ambulatory Visit: Payer: 59 | Admitting: Internal Medicine

## 2021-11-23 ENCOUNTER — Encounter: Payer: Self-pay | Admitting: Internal Medicine

## 2021-11-23 ENCOUNTER — Other Ambulatory Visit (HOSPITAL_BASED_OUTPATIENT_CLINIC_OR_DEPARTMENT_OTHER): Payer: Self-pay

## 2021-11-23 VITALS — BP 120/72 | HR 61 | Ht 68.0 in | Wt 194.4 lb

## 2021-11-23 DIAGNOSIS — K2970 Gastritis, unspecified, without bleeding: Secondary | ICD-10-CM | POA: Diagnosis not present

## 2021-11-23 DIAGNOSIS — K222 Esophageal obstruction: Secondary | ICD-10-CM

## 2021-11-23 DIAGNOSIS — R1013 Epigastric pain: Secondary | ICD-10-CM

## 2021-11-23 MED ORDER — PANTOPRAZOLE SODIUM 40 MG PO TBEC
40.0000 mg | DELAYED_RELEASE_TABLET | Freq: Two times a day (BID) | ORAL | 1 refills | Status: DC
  Filled 2021-11-23: qty 60, 30d supply, fill #0
  Filled 2022-04-17: qty 60, 30d supply, fill #1

## 2021-11-23 MED ORDER — HYOSCYAMINE SULFATE 0.125 MG PO TABS
0.1250 mg | ORAL_TABLET | Freq: Three times a day (TID) | ORAL | 0 refills | Status: DC
  Filled 2021-11-23: qty 90, 30d supply, fill #0

## 2021-11-23 MED ORDER — BUSPIRONE HCL 10 MG PO TABS
5.0000 mg | ORAL_TABLET | Freq: Two times a day (BID) | ORAL | 1 refills | Status: DC
Start: 2021-11-23 — End: 2022-01-06
  Filled 2021-11-23: qty 60, 30d supply, fill #0

## 2021-11-23 NOTE — Patient Instructions (Signed)
We have sent the following medications to your pharmacy for you to pick up at your convenience: Pantoprazole, buspar  Use the hyoscyamine as needed.  Stop the Carafate.   If you are age 63 or older, your body mass index should be between 23-30. Your Body mass index is 29.55 kg/m. If this is out of the aforementioned range listed, please consider follow up with your Primary Care Provider.  If you are age 50 or younger, your body mass index should be between 19-25. Your Body mass index is 29.55 kg/m. If this is out of the aformentioned range listed, please consider follow up with your Primary Care Provider.   ________________________________________________________  The [facility] providers would like to encourage you to use [facility] to communicate with providers for non-urgent requests or questions.  Due to long hold times on the telephone, sending your provider a message by [facility] may be a faster and more efficient way to get a response.  Please allow 48 business hours for a response.  Please remember that this is for non-urgent requests.  _______________________________________________________  I appreciate the opportunity to care for you. Silvano Rusk, MD, [facility]

## 2021-11-23 NOTE — Progress Notes (Signed)
Bruce Woods 61 y.o. [birth year] [id number]  Assessment & Plan:   Encounter Diagnoses  Name Primary?   Dyspepsia Yes   Gastritis without bleeding, unspecified chronicity, unspecified gastritis type    Schatzki's ring     This sounds most like a functional dyspepsia problem.  I suspect significant impaired gastric accommodation.  He did not have a lot of GI symptoms with COVID though I suppose that could be the viral source for a postviral gastric dysfunction.  He has not responded completely to high-dose PPI twice daily and Carafate.  I doubt this is an acid related phenomenon.  He and I both discussed the possibility of whether he was having some dysphagia to the Schatzki ring.  Seems unlikely based upon what Dr. Bryan Woods saw plus Dr. Bryan Woods disrupted it with forceps.  There is a background of significant stress which could be contributing.  Do not think this is gallbladder based upon the symptom complex though that has not been thoroughly evaluated might need to do so.  Another option would be to rescope if he has persistent problems despite treatment as below.  Consider dilation of the esophageal ring.  Note that he has very little weight loss i.e. nothing significant.  That is reassuring.  Current plans are:  Continue pantoprazole 40 mg twice daily  Discontinue Carafate  Add hyoscyamine 0.125 mg prior to meals as needed  Start buspirone 10 mg twice daily but take 5 mg twice daily for the first worried.  This is a treatment for functional dyspepsia.  Warned about rare side effects of nightmares and dystonia.  Return to see me about 1 month   CC: Bruce Branch, MD   Subjective:   Chief Complaint: Epigastric pain  HPI Bruce Woods returns with his wife Bruce Woods though he is somewhat improved he still having a lot of problems.  Many days he has a hard time tolerating solid foods.  He will regurgitate or vomit though not all of the time.  Still has a lot of early satiety and  postprandial abdominal pressure and pain.  And though he does not have quite the softball like severe pressure in the epigastrium that he had when this started the other month he still has some sensation of that.  He had taken his pantoprazole even 4 times a day and used all of that up and the Carafate up.  Still has significant stressors with work and family life.  Says he wants to be able to eat [place] food and spicy food again.  He has been following a very bland diet.  If he stays on the liquid side of things he does not have any problems.  Recall that EGD i October 11, 2021 showed a nonobstructing Schatzki ring, grade a esophagitis, gastritis.  Biopsies demonstrated a normal duodenum, reflux esophagitis changes and gastritis without H. pylori.  The Schatzki ring was disrupted with forceps when biopsies of the esophagitis were taken.  At his presentation in November and then ED visit a CT abdomen and pelvis with contrast was negative.  Normal throughout.  Labs have been normal except for some hyperglycemia and mild elevation in creatinine at times.   Wt Readings from Last 3 Encounters:  11/23/21 194 lb 6 oz (88.2 kg)  10/19/21 197 lb (89.4 kg)  10/11/21 197 lb (89.4 kg)    Allergies  Allergen Reactions   Morphine Other (See Comments)    Resp distress, rash, tachycardia   Doxycycline Nausea And Vomiting   Celebrex [  Celecoxib] Other (See Comments)    Elevated LFT's   Wellbutrin [Bupropion] Hives   Current Meds  Medication Sig   amLODipine (NORVASC) 5 MG tablet Take 1 tablet (5 mg total) by mouth daily.   bictegravir-emtricitabine-tenofovir AF (BIKTARVY) 50-200-25 MG TABS tablet TAKE 1 TABLET BY MOUTH DAILY.   calcium carbonate (TUMS EX) 750 MG chewable tablet Chew 2 tablets by mouth. Multiple times a day   citalopram (CELEXA) 20 MG tablet Take 1 tablet (20 mg total) by mouth daily.   methocarbamol (ROBAXIN) 500 MG tablet    ondansetron (ZOFRAN) 4 MG tablet Take 2 tablets (8 mg total)  by mouth every 8 (eight) hours as needed for nausea or vomiting.   pantoprazole (PROTONIX) 40 MG tablet Take 1 tablet (40 mg total) by mouth 2 (two) times daily.   sildenafil (REVATIO) 20 MG tablet TAKE 3-4 TABLETS (60-80 MG TOTAL) BY MOUTH AT BEDTIME AS NEEDED.   sucralfate (CARAFATE) 1 g tablet Take 1 tablet (1 g total) by mouth 4 (four) times daily -  with meals and at bedtime.   zolpidem (AMBIEN) 10 MG tablet TAKE 1 TABLET BY MOUTH EVERY NIGHT AT BEDTIME AS NEEDED FOR SLEEP   Past Medical History:  Diagnosis Date   COVID-19 07/2021   DEPRESSION    Gastritis and gastroduodenitis    GERD (gastroesophageal reflux disease)    HIV DISEASE    IBS (irritable bowel syndrome)    Sarcoidosis 2000   question of   Ulnar nerve compression, right    Past Surgical History:  Procedure Laterality Date   COLONOSCOPY  2010   ESOPHAGOGASTRODUODENOSCOPY  2002   LUNG SURGERY  10/31/1998   vats   TONSILLECTOMY AND ADENOIDECTOMY     ULNAR NERVE TRANSPOSITION Right 05/17/2018   Procedure: RIGHT ULNAR NERVE DECOMPRESSION/TRANSPOSITION;  Surgeon: Leanora Cover, MD;  Location: [place];  Service: Orthopedics;  Laterality: Right;   Social History   Social History Narrative   Married to Bruce Woods, daughters Bruce Woods , Bruce Woods\   RN PACU, ED   Former smoker no drug use occasional alcohol   family history includes Cancer in his mother; Hyperlipidemia in his mother; Hypertension in his mother.   Review of Systems As per HPI  Objective:   Physical Exam BP 120/72    Pulse 61    Ht 5\' 8"  (1.727 m)    Wt 194 lb 6 oz (88.2 kg)    BMI 29.55 kg/m  Well-developed well-nourished no acute distress Abdomen is soft mildly tender in the epigastrium negative Carnett's sign no masses bowel sounds present

## 2021-11-24 ENCOUNTER — Other Ambulatory Visit (HOSPITAL_BASED_OUTPATIENT_CLINIC_OR_DEPARTMENT_OTHER): Payer: Self-pay

## 2021-12-02 DIAGNOSIS — F411 Generalized anxiety disorder: Secondary | ICD-10-CM | POA: Diagnosis not present

## 2021-12-02 DIAGNOSIS — F902 Attention-deficit hyperactivity disorder, combined type: Secondary | ICD-10-CM | POA: Diagnosis not present

## 2021-12-02 DIAGNOSIS — F319 Bipolar disorder, unspecified: Secondary | ICD-10-CM | POA: Diagnosis not present

## 2021-12-02 DIAGNOSIS — F5102 Adjustment insomnia: Secondary | ICD-10-CM | POA: Diagnosis not present

## 2021-12-06 ENCOUNTER — Other Ambulatory Visit: Payer: Self-pay | Admitting: Internal Medicine

## 2021-12-06 ENCOUNTER — Other Ambulatory Visit (HOSPITAL_COMMUNITY): Payer: Self-pay

## 2021-12-06 DIAGNOSIS — B20 Human immunodeficiency virus [HIV] disease: Secondary | ICD-10-CM

## 2021-12-06 MED ORDER — BIKTARVY 50-200-25 MG PO TABS
1.0000 | ORAL_TABLET | Freq: Every day | ORAL | 5 refills | Status: DC
  Filled 2021-12-06: qty 30, 30d supply, fill #0
  Filled 2022-01-04: qty 30, 30d supply, fill #1
  Filled 2022-02-01: qty 30, 30d supply, fill #2
  Filled 2022-03-02: qty 30, 30d supply, fill #3
  Filled 2022-03-31: qty 30, 30d supply, fill #4
  Filled 2022-04-28: qty 30, 30d supply, fill #5

## 2021-12-07 ENCOUNTER — Other Ambulatory Visit (HOSPITAL_COMMUNITY): Payer: Self-pay

## 2021-12-16 DIAGNOSIS — C4491 Basal cell carcinoma of skin, unspecified: Secondary | ICD-10-CM | POA: Insufficient documentation

## 2022-01-04 ENCOUNTER — Other Ambulatory Visit (HOSPITAL_COMMUNITY): Payer: Self-pay

## 2022-01-06 ENCOUNTER — Ambulatory Visit: Payer: 59 | Admitting: Internal Medicine

## 2022-01-06 ENCOUNTER — Encounter: Payer: Self-pay | Admitting: Internal Medicine

## 2022-01-06 VITALS — BP 120/78 | HR 78 | Ht 68.0 in | Wt 196.0 lb

## 2022-01-06 DIAGNOSIS — F439 Reaction to severe stress, unspecified: Secondary | ICD-10-CM | POA: Diagnosis not present

## 2022-01-06 DIAGNOSIS — R1013 Epigastric pain: Secondary | ICD-10-CM | POA: Diagnosis not present

## 2022-01-06 DIAGNOSIS — Z1211 Encounter for screening for malignant neoplasm of colon: Secondary | ICD-10-CM | POA: Diagnosis not present

## 2022-01-06 NOTE — Progress Notes (Signed)
? ?Bruce Woods 61 y.o. 04-19-1961 [id number] ? ?Assessment & Plan:  ? ?Encounter Diagnoses  ?Name Primary?  ? Dyspepsia Yes  ? Bruce cancer screening   ? Situational stress   ? ?Fortunately he is feeling better with some adjustments in worklife balance.  We will try to taper off the PPI and use famotidine instead as he has done in the past.  There is no strong indication to continue on PPI. ? ?We discussed options for Bruce cancer screening and how he is "overdue".  He will discuss further with Dr. Larose Woods as well.  He is not inclined to schedule colonoscopy at this time.  Certainly there are other options with iFOBT annually or every 3-year Cologuard should he choose.  He can always call and arrange a colonoscopy at his convenience. ? ?CC: Bruce Branch, MD ? ? ?Subjective:  ? ?Chief Complaint: Dyspepsia follow-up-improved ? ?HPI ?Bruce Woods presents today saying he feels much better.  When he was last here in January so far objective evidence for cause of his symptoms was negative and he was prescribed buspirone.  He took a week off work changed how he needs, needs lower does not eat as much when he does eat and either stopped or never started the buspirone.  He is down to 1 pantoprazole a day.  He feels much much better.  He has made some changes at work as well.  Wife is through surgery. ? ?Labs, CT abdomen pelvis in November, unrevealing ? ?EGD December 2022 ? ?- Non-obstructing Schatzki ring. Biopsied. ?- 3 cm hiatal hernia. ?- Gastroesophageal flap valve classified as Hill Grade III (minimal fold, loose to endoscope, ?hiatal hernia likely). ?- Gastritis. Biopsied. ?- Non-bleeding duodenal diverticulum. ?- Normal mucosa was found in the duodenal bulb, in the first portion of the duodenum and in ?the second portion of the duodenum. Biopsied ?Diagnosis ?1. Surgical [P], duodenum ?- DUODENAL MUCOSA WITH NO SPECIFIC HISTOPATHOLOGIC CHANGES ?- NEGATIVE FOR INCREASED INTRAEPITHELIAL LYMPHOCYTES OR VILLOUS  ARCHITECTURAL CHANGES ?2. Surgical [P], gastric anturm and gastric body ?- GASTRIC ANTRAL AND OXYNTIC MUCOSA WITH MILD CHRONIC GASTRITIS ?- WARTHIN STARRY STAIN IS NEGATIVE FOR HELICOBACTER PYLORI ?3. Surgical [P], Bruce, GE junction ?- ESOPHAGEAL SQUAMOUS AND CARDIAC MUCOSA WITH REFLUX-ASSOCIATED CHANGES ?- NEGATIVE FOR INTESTINAL METAPLASIA OR DYSPLASIA ? ? ? ? ?It has been 12 years since his last colonoscopy.  He is not inclined to pursue one at this time. ?Allergies  ?Allergen Reactions  ? Morphine Other (See Comments)  ?  Resp distress, rash, tachycardia  ? Doxycycline Nausea And Vomiting  ? Celebrex [Celecoxib] Other (See Comments)  ?  Elevated LFT's  ? Wellbutrin [Bupropion] Hives  ? ?Current Meds  ?Medication Sig  ? amLODipine (NORVASC) 5 MG tablet Take 1 tablet (5 mg total) by mouth daily.  ? bictegravir-emtricitabine-tenofovir AF (BIKTARVY) 50-200-25 MG TABS tablet TAKE 1 TABLET BY MOUTH DAILY.  ? citalopram (CELEXA) 20 MG tablet Take 1 tablet (20 mg total) by mouth daily.  ? methocarbamol (ROBAXIN) 500 MG tablet 500 mg as needed.  ? pantoprazole (PROTONIX) 40 MG tablet Take 1 tablet (40 mg total) by mouth 2 (two) times daily. (Patient taking differently: Take 40 mg by mouth daily.)  ? sildenafil (REVATIO) 20 MG tablet TAKE 3-4 TABLETS (60-80 MG TOTAL) BY MOUTH AT BEDTIME AS NEEDED.  ? zolpidem (AMBIEN) 10 MG tablet TAKE 1 TABLET BY MOUTH EVERY NIGHT AT BEDTIME AS NEEDED FOR SLEEP  ? ?Past Medical History:  ?Diagnosis Date  ? COVID-19  07/2021  ? DEPRESSION   ? Gastritis and gastroduodenitis   ? GERD (gastroesophageal reflux disease)   ? HIV DISEASE   ? IBS (irritable bowel syndrome)   ? Sarcoidosis 2000  ? question of  ? Ulnar nerve compression, right   ? ?Past Surgical History:  ?Procedure Laterality Date  ? COLONOSCOPY  2010  ? ESOPHAGOGASTRODUODENOSCOPY  2002  ? LUNG SURGERY  10/31/1998  ? vats  ? TONSILLECTOMY AND ADENOIDECTOMY    ? ULNAR NERVE TRANSPOSITION Right 05/17/2018  ? Procedure: RIGHT ULNAR  NERVE DECOMPRESSION/TRANSPOSITION;  Surgeon: Leanora Cover, MD;  Location: [place];  Service: Orthopedics;  Laterality: Right;  ? ?Social History  ? ?Social History Narrative  ? Married to Bruce Woods, daughters Bruce Woods  ? RN PACU, ED  ? Former smoker no drug use occasional alcohol  ? ?family history includes Cancer in his mother; Hyperlipidemia in his mother; Hypertension in his mother. ? ? ?Review of Systems ?As per HPI ? ?Objective:  ? Physical Exam ? ? ?

## 2022-01-06 NOTE — Patient Instructions (Signed)
Try to wen off your Protonix and go to Pepcid.  ? ?Call us when your ready to set up a colon. Talk to Dr Larose Kells about options. ? ?If you are age 63 or older, your body mass index should be between 23-30. Your Body mass index is 29.8 kg/m?Marland Kitchen If this is out of the aforementioned range listed, please consider follow up with your Primary Care Provider. ? ?If you are age 49 or younger, your body mass index should be between 19-25. Your Body mass index is 29.8 kg/m?Marland Kitchen If this is out of the aformentioned range listed, please consider follow up with your Primary Care Provider.  ? ?________________________________________________________ ? ?The [facility] providers would like to encourage you to use [facility] to communicate with providers for non-urgent requests or questions.  Due to long hold times on the telephone, sending your provider a message by [facility] may be a faster and more efficient way to get a response.  Please allow 48 business hours for a response.  Please remember that this is for non-urgent requests.  ?_______________________________________________________ ? ?I appreciate the opportunity to care for you. ?Silvano Rusk, MD, [facility]  ?

## 2022-01-12 ENCOUNTER — Other Ambulatory Visit (HOSPITAL_COMMUNITY): Payer: Self-pay

## 2022-01-13 ENCOUNTER — Other Ambulatory Visit (HOSPITAL_BASED_OUTPATIENT_CLINIC_OR_DEPARTMENT_OTHER): Payer: Self-pay

## 2022-01-17 ENCOUNTER — Encounter: Payer: Self-pay | Admitting: Internal Medicine

## 2022-01-17 ENCOUNTER — Ambulatory Visit (INDEPENDENT_AMBULATORY_CARE_PROVIDER_SITE_OTHER): Payer: 59 | Admitting: Internal Medicine

## 2022-01-17 VITALS — BP 142/74 | HR 73 | Temp 98.3°F | Resp 16 | Ht 68.0 in | Wt 199.2 lb

## 2022-01-17 DIAGNOSIS — E559 Vitamin D deficiency, unspecified: Secondary | ICD-10-CM | POA: Diagnosis not present

## 2022-01-17 DIAGNOSIS — Z0000 Encounter for general adult medical examination without abnormal findings: Secondary | ICD-10-CM

## 2022-01-17 LAB — VITAMIN D 25 HYDROXY (VIT D DEFICIENCY, FRACTURES): VITD: 22.21 ng/mL — ABNORMAL LOW (ref 30.00–100.00)

## 2022-01-17 LAB — PSA: PSA: 0.32 ng/mL (ref 0.10–4.00)

## 2022-01-17 NOTE — Assessment & Plan Note (Signed)
Here for CPX ?+ HIV: Follow-up by ID ?HTN: On amlodipine, BP today is in the 140s, typically 120s.  No change ?Dyslipidemia: On no medication, CVRF 10 years: 15.6%.  Discussed statin / ASA  indication for primary prevention of vascular events.  Declines.   ?Depression: On citalopram, symptoms controlled, admits to stress, work-related, currently managing well. ?Vitamin D deficiency: Does not take supplements regularly, encouraged to do.  Check labs  ?Epigastric pain: Since the last visit, had a upper endoscopy 10/11/2021 Reflux esophagitis, Schatzki ring, BX taken , doing better ?RTC 1 year.  ?

## 2022-01-17 NOTE — Progress Notes (Signed)
? ?Subjective:  ? ? Patient ID: Bruce Woods, male    DOB: 09/22/1961, 61 y.o.   MRN: [id number] ? ?DOS:  01/17/2022 ?Type of visit - description: CPX ? ?Here for CPX ?Since the last office visit he was having a stomach problems, saw GI, had endoscopy, currently doing well. ?Denies fever, chest pain or difficulty breathing ?No diarrhea, no blood in the stools ?No GU symptoms ? ?BP Readings from Last 3 Encounters:  ?01/17/22 (!) 142/74  ?01/06/22 120/78  ?11/23/21 120/72  ? ? ? ?Review of Systems ? ?Other than above, a 14 point review of systems is negative  ? ?  ? ? ?Past Medical History:  ?Diagnosis Date  ? COVID-19 07/2021  ? DEPRESSION   ? Gastritis and gastroduodenitis   ? GERD (gastroesophageal reflux disease)   ? HIV DISEASE   ? IBS (irritable bowel syndrome)   ? Sarcoidosis 2000  ? question of  ? Ulnar nerve compression, right   ? ? ?Past Surgical History:  ?Procedure Laterality Date  ? COLONOSCOPY  2010  ? ESOPHAGOGASTRODUODENOSCOPY  2002  ? LUNG SURGERY  10/31/1998  ? vats  ? TONSILLECTOMY AND ADENOIDECTOMY    ? ULNAR NERVE TRANSPOSITION Right 05/17/2018  ? Procedure: RIGHT ULNAR NERVE DECOMPRESSION/TRANSPOSITION;  Surgeon: Leanora Cover, MD;  Location: [place];  Service: Orthopedics;  Laterality: Right;  ? ?Social History  ? ?Socioeconomic History  ? Marital status: Married  ?  Spouse name: Not on file  ? Number of children: 2  ? Years of education: Not on file  ? Highest education level: Not on file  ?Occupational History  ? 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  Employer: [employer]  ?Tobacco Use  ? Smoking status: Former  ?  Packs/day: 1.00  ?  Years: 2.00  ?  Pack years: 2.00  ?  Types: Cigarettes  ?  Quit date: 11/01/1983  ?  Years since quitting: 38.2  ? Smokeless tobacco: Never  ?Vaping Use  ? Vaping Use: Never used  ?Substance and Sexual Activity  ? Alcohol use: Yes  ?  Alcohol/week: 1.0 standard drink  ?  Types: 1 Standard drinks or equivalent per week  ?  Comment: socially    ? Drug use: No  ? Sexual activity: Yes  ?  Comment: declined condoms  ?Other Topics Concern  ? Not on file  ?Social History Narrative  ? Married to [place], daughters [name]  ? RN PACU, ED  ? Former smoker no drug use occasional alcohol  ? ?Social Determinants of Health  ? ?Financial Resource Strain: Not on file  ?Food Insecurity: Not on file  ?Transportation Needs: Not on file  ?Physical Activity: Not on file  ?Stress: Not on file  ?Social Connections: Not on file  ?Intimate Partner Violence: Not on file  ? ? ?Current Outpatient Medications  ?Medication Instructions  ? amLODipine (NORVASC) 5 mg, Oral, Daily  ? bictegravir-emtricitabine-tenofovir AF (BIKTARVY) 50-200-25 MG TABS tablet TAKE 1 TABLET BY MOUTH DAILY.  ? citalopram (CELEXA) 20 mg, Oral, Daily  ? methocarbamol (ROBAXIN) 500 mg, As needed  ? pantoprazole (PROTONIX) 40 mg, Oral, 2 times daily  ? sildenafil (REVATIO) 20 MG tablet TAKE 3-4 TABLETS (60-80 MG TOTAL) BY MOUTH AT BEDTIME AS NEEDED.  ? zolpidem (AMBIEN) 10 MG tablet TAKE 1 TABLET BY MOUTH EVERY NIGHT AT BEDTIME AS NEEDED FOR SLEEP  ? ? ?   ?Objective:  ? Physical Exam ?BP (!) 142/74 (  BP Location: Left Arm, Patient Position: Sitting, Cuff Size: Small)   Pulse 73   Temp 98.3 ?F (36.8 ?C) (Oral)   Resp 16   Ht '5\' 8"'$  (1.727 m)   Wt 199 lb 4 oz (90.4 kg)   SpO2 97%   BMI 30.30 kg/m?  ?General: ?Well developed, NAD, BMI noted ?Neck: No  thyromegaly  ?HEENT:  ?Normocephalic . Face symmetric, atraumatic ?Lungs:  ?CTA B ?Normal respiratory effort, no intercostal retractions, no accessory muscle use. ?Heart: RRR,  no murmur.  ?Abdomen:  ?Not distended, soft, non-tender. No rebound or rigidity.   ?Lower extremities: no pretibial edema bilaterally  ?Skin: Exposed areas without rash. Not pale. Not jaundice ?DRE: Normal sphincter tone, no stools, prostate normal ?Neurologic:  ?alert & oriented X3.  ?Speech normal, gait appropriate for age and unassisted ?Strength symmetric and appropriate  for age.  ?Psych: ?Cognition and judgment appear intact.  ?Cooperative with normal attention span and concentration.  ?Behavior appropriate. ?No anxious or depressed appearing. ? ?   ?Assessment   ? ?  ?Assessment: ?HIV + ?HTN: DX 10/26/2020 ?Depression ?ED ?Vitamin D deficiency ??sarcoidosis dx 2000 ?CP, presyncope, palpitation: 2018>> saw cardiology and pulmonary. CXR, ECHO, GTX, cardiac event monitor negative.   ?Peyronie's dz , penis ?MSK: h/o hip bursitis, sees Dr Ninfa Linden ? ?PLAN ?Here for CPX ?+ HIV: Follow-up by ID ?HTN: On amlodipine, BP today is in the 140s, typically 120s.  No change ?Dyslipidemia: On no medication, CVRF 10 years: 15.6%.  Discussed statin / ASA  indication for primary prevention of vascular events.  Declines.   ?Depression: On citalopram, symptoms controlled, admits to stress, work-related, currently managing well. ?Vitamin D deficiency: Does not take supplements regularly, encouraged to do.  Check labs  ?Epigastric pain: Since the last visit, had a upper endoscopy 10/11/2021 Reflux esophagitis, Schatzki ring, BX taken , doing better ?RTC 1 year.  ? ?  ? ?This visit occurred during the SARS-CoV-2 public health emergency.  Safety protocols were in place, including screening questions prior to the visit, additional usage of staff PPE, and extensive cleaning of exam room while observing appropriate contact time as indicated for disinfecting solutions.  ? ?

## 2022-01-17 NOTE — Assessment & Plan Note (Signed)
-   Tdap 2019 ?- PNM 13 : 2017; PNM 23: 2015; declines booster d/t previous s/e  ?- Shingrix discussed ?- COVID VAX-up-to-date ?- Flu shot recommended seasonally ?-Prostate cancer screening: DRE negative, no symptoms, check PSA. ?- CCS: cscope 2010, Dr Shary Key W[facility] [place]); due for CCS, he discussed the issue with GI 01/06/2022.  At this point he is not ready to proceed, thinking about doing a colonoscopy at the end of the year. ?-Labs: PSA, vitamin D ?-Diet and exercise discussed.  ?-ACP info provided ? ? ?

## 2022-01-17 NOTE — Patient Instructions (Addendum)
? ? ?  GO TO THE LAB : Get the blood work   ? ? ?[place], [place] ?Come back for physical exam in 1 year ? ? ?.  Currently doing great ?"Living will", "[place] of attorney": Advanced care planning ? ?(If you already have a living will or healthcare power of attorney, please bring the copy to be scanned in your chart.) ? ?Advance care planning is a process that supports adults in  understanding and sharing their preferences regarding future medical care.  ? ?The patient's preferences are recorded in documents called Advance Directives.    ?Advanced directives are completed (and can be modified at any time) while the patient is in full mental capacity.  ? ?The documentation should be available at all times to the patient, the family and the healthcare providers.  ?Bring in a copy to be scanned in your chart is an excellent idea and is recommended  ? ?This legal documents direct treatment decision making and/or appoint a surrogate to make the decision if the patient is not capable to do so.  ? ? ?Advance directives can be documented in many types of formats,  documents have names such as:  ?Lliving will  ?Durable power of attorney for healthcare (healthcare proxy or healthcare power of attorney)  ?Combined directives  ?Physician orders for life-sustaining treatment  ?  ?More information at: ? ?[email or web address]  ?

## 2022-01-18 ENCOUNTER — Other Ambulatory Visit (HOSPITAL_BASED_OUTPATIENT_CLINIC_OR_DEPARTMENT_OTHER): Payer: Self-pay

## 2022-01-18 MED ORDER — VITAMIN D (ERGOCALCIFEROL) 1.25 MG (50000 UNIT) PO CAPS
50000.0000 [IU] | ORAL_CAPSULE | ORAL | 0 refills | Status: DC
  Filled 2022-01-18: qty 12, 84d supply, fill #0

## 2022-01-18 NOTE — Addendum Note (Signed)
Addended byDamita Dunnings D on: 01/18/2022 07:56 AM ? ? Modules accepted: Orders ? ?

## 2022-02-01 ENCOUNTER — Other Ambulatory Visit (HOSPITAL_COMMUNITY): Payer: Self-pay

## 2022-02-09 ENCOUNTER — Other Ambulatory Visit: Payer: Self-pay | Admitting: Internal Medicine

## 2022-02-09 ENCOUNTER — Other Ambulatory Visit (HOSPITAL_BASED_OUTPATIENT_CLINIC_OR_DEPARTMENT_OTHER): Payer: Self-pay

## 2022-02-09 ENCOUNTER — Other Ambulatory Visit (HOSPITAL_COMMUNITY): Payer: Self-pay

## 2022-02-09 DIAGNOSIS — F3342 Major depressive disorder, recurrent, in full remission: Secondary | ICD-10-CM

## 2022-02-09 MED ORDER — CITALOPRAM HYDROBROMIDE 20 MG PO TABS
20.0000 mg | ORAL_TABLET | Freq: Every day | ORAL | 3 refills | Status: DC
  Filled 2022-02-09: qty 90, 90d supply, fill #0
  Filled 2022-04-27: qty 90, 90d supply, fill #1
  Filled 2022-08-06: qty 90, 90d supply, fill #2
  Filled 2022-11-09: qty 90, 90d supply, fill #3

## 2022-02-24 DIAGNOSIS — F902 Attention-deficit hyperactivity disorder, combined type: Secondary | ICD-10-CM | POA: Diagnosis not present

## 2022-02-24 DIAGNOSIS — F411 Generalized anxiety disorder: Secondary | ICD-10-CM | POA: Diagnosis not present

## 2022-02-24 DIAGNOSIS — F319 Bipolar disorder, unspecified: Secondary | ICD-10-CM | POA: Diagnosis not present

## 2022-02-24 DIAGNOSIS — F5102 Adjustment insomnia: Secondary | ICD-10-CM | POA: Diagnosis not present

## 2022-03-02 ENCOUNTER — Other Ambulatory Visit (HOSPITAL_COMMUNITY): Payer: Self-pay

## 2022-03-04 ENCOUNTER — Other Ambulatory Visit (HOSPITAL_BASED_OUTPATIENT_CLINIC_OR_DEPARTMENT_OTHER): Payer: Self-pay

## 2022-03-04 DIAGNOSIS — E291 Testicular hypofunction: Secondary | ICD-10-CM | POA: Diagnosis not present

## 2022-03-04 DIAGNOSIS — N486 Induration penis plastica: Secondary | ICD-10-CM | POA: Diagnosis not present

## 2022-03-04 DIAGNOSIS — N529 Male erectile dysfunction, unspecified: Secondary | ICD-10-CM | POA: Diagnosis not present

## 2022-03-04 MED ORDER — TADALAFIL 5 MG PO TABS
ORAL_TABLET | ORAL | 3 refills | Status: DC
  Filled 2022-03-04: qty 90, 90d supply, fill #0
  Filled 2022-06-16: qty 90, 90d supply, fill #1
  Filled 2022-10-10 – 2022-10-11 (×2): qty 90, 90d supply, fill #2
  Filled 2022-10-19 – 2023-01-09 (×2): qty 90, 90d supply, fill #3

## 2022-03-07 DIAGNOSIS — E291 Testicular hypofunction: Secondary | ICD-10-CM | POA: Diagnosis not present

## 2022-03-09 ENCOUNTER — Other Ambulatory Visit (HOSPITAL_COMMUNITY): Payer: Self-pay

## 2022-03-15 ENCOUNTER — Other Ambulatory Visit: Payer: Self-pay

## 2022-03-15 ENCOUNTER — Emergency Department (HOSPITAL_BASED_OUTPATIENT_CLINIC_OR_DEPARTMENT_OTHER): Payer: 59

## 2022-03-15 ENCOUNTER — Emergency Department (HOSPITAL_BASED_OUTPATIENT_CLINIC_OR_DEPARTMENT_OTHER)
Admission: EM | Admit: 2022-03-15 | Discharge: 2022-03-15 | Disposition: A | Discharge status: Home / Self Care | Payer: 59 | Attending: Emergency Medicine | Admitting: Emergency Medicine

## 2022-03-15 ENCOUNTER — Encounter (HOSPITAL_BASED_OUTPATIENT_CLINIC_OR_DEPARTMENT_OTHER): Payer: Self-pay

## 2022-03-15 DIAGNOSIS — J029 Acute pharyngitis, unspecified: Secondary | ICD-10-CM | POA: Diagnosis present

## 2022-03-15 DIAGNOSIS — Z8616 Personal history of COVID-19: Secondary | ICD-10-CM | POA: Insufficient documentation

## 2022-03-15 DIAGNOSIS — R0602 Shortness of breath: Secondary | ICD-10-CM | POA: Diagnosis not present

## 2022-03-15 DIAGNOSIS — Z21 Asymptomatic human immunodeficiency virus [HIV] infection status: Secondary | ICD-10-CM | POA: Insufficient documentation

## 2022-03-15 DIAGNOSIS — J385 Laryngeal spasm: Secondary | ICD-10-CM | POA: Insufficient documentation

## 2022-03-15 DIAGNOSIS — Z20822 Contact with and (suspected) exposure to covid-19: Secondary | ICD-10-CM | POA: Insufficient documentation

## 2022-03-15 DIAGNOSIS — J039 Acute tonsillitis, unspecified: Secondary | ICD-10-CM | POA: Diagnosis not present

## 2022-03-15 LAB — CBC WITH DIFFERENTIAL/PLATELET
Abs Immature Granulocytes: 0.04 10*3/uL (ref 0.00–0.07)
Basophils Absolute: 0 10*3/uL (ref 0.0–0.1)
Basophils Relative: 0 %
Eosinophils Absolute: 0.1 10*3/uL (ref 0.0–0.5)
Eosinophils Relative: 1 %
HCT: 45.5 % (ref 39.0–52.0)
Hemoglobin: 16.3 g/dL (ref 13.0–17.0)
Immature Granulocytes: 0 %
Lymphocytes Relative: 35 %
Lymphs Abs: 3.6 10*3/uL (ref 0.7–4.0)
MCH: 33.1 pg (ref 26.0–34.0)
MCHC: 35.8 g/dL (ref 30.0–36.0)
MCV: 92.3 fL (ref 80.0–100.0)
Monocytes Absolute: 0.9 10*3/uL (ref 0.1–1.0)
Monocytes Relative: 9 %
Neutro Abs: 5.7 10*3/uL (ref 1.7–7.7)
Neutrophils Relative %: 55 %
Platelets: 234 10*3/uL (ref 150–400)
RBC: 4.93 MIL/uL (ref 4.22–5.81)
RDW: 13.2 % (ref 11.5–15.5)
WBC: 10.4 10*3/uL (ref 4.0–10.5)
nRBC: 0 % (ref 0.0–0.2)

## 2022-03-15 LAB — URINALYSIS, ROUTINE W REFLEX MICROSCOPIC
Bilirubin Urine: NEGATIVE
Glucose, UA: NEGATIVE mg/dL
Hgb urine dipstick: NEGATIVE
Ketones, ur: NEGATIVE mg/dL
Leukocytes,Ua: NEGATIVE
Nitrite: NEGATIVE
Protein, ur: NEGATIVE mg/dL
Specific Gravity, Urine: 1.01 (ref 1.005–1.030)
pH: 6 (ref 5.0–8.0)

## 2022-03-15 LAB — RESP PANEL BY RT-PCR (FLU A&B, COVID) ARPGX2
Influenza A by PCR: NEGATIVE
Influenza B by PCR: NEGATIVE
SARS Coronavirus 2 by RT PCR: NEGATIVE

## 2022-03-15 LAB — COMPREHENSIVE METABOLIC PANEL
ALT: 20 U/L (ref 0–44)
AST: 20 U/L (ref 15–41)
Albumin: 4.7 g/dL (ref 3.5–5.0)
Alkaline Phosphatase: 80 U/L (ref 38–126)
Anion gap: 8 (ref 5–15)
BUN: 15 mg/dL (ref 8–23)
CO2: 28 mmol/L (ref 22–32)
Calcium: 9.8 mg/dL (ref 8.9–10.3)
Chloride: 104 mmol/L (ref 98–111)
Creatinine, Ser: 1.38 mg/dL — ABNORMAL HIGH (ref 0.61–1.24)
GFR, Estimated: 57 mL/min — ABNORMAL LOW (ref >60)
Glucose, Bld: 88 mg/dL (ref 70–99)
Potassium: 3.7 mmol/L (ref 3.5–5.1)
Sodium: 140 mmol/L (ref 135–145)
Total Bilirubin: 1.1 mg/dL (ref 0.3–1.2)
Total Protein: 7.9 g/dL (ref 6.5–8.1)

## 2022-03-15 LAB — LACTIC ACID, PLASMA: Lactic Acid, Venous: 1.9 mmol/L (ref 0.5–1.9)

## 2022-03-15 LAB — LIPASE, BLOOD: Lipase: 39 U/L (ref 11–51)

## 2022-03-15 LAB — GROUP A STREP BY PCR: Group A Strep by PCR: NOT DETECTED

## 2022-03-15 MED ORDER — SODIUM CHLORIDE 0.9 % IN NEBU
INHALATION_SOLUTION | RESPIRATORY_TRACT | Status: AC
  Administered 2022-03-15: 3 mL
  Filled 2022-03-15: qty 3

## 2022-03-15 MED ORDER — SODIUM CHLORIDE 0.9 % IV BOLUS
1000.0000 mL | Freq: Once | INTRAVENOUS | Status: AC
  Administered 2022-03-15: 1000 mL via INTRAVENOUS

## 2022-03-15 MED ORDER — RACEPINEPHRINE HCL 2.25 % IN NEBU
INHALATION_SOLUTION | RESPIRATORY_TRACT | Status: AC
  Administered 2022-03-15: 0.5 mL
  Filled 2022-03-15: qty 0.5

## 2022-03-15 MED ORDER — LORAZEPAM 2 MG/ML IJ SOLN
1.0000 mg | Freq: Once | INTRAMUSCULAR | Status: AC
  Administered 2022-03-15: 1 mg via INTRAVENOUS
  Filled 2022-03-15: qty 1

## 2022-03-15 MED ORDER — SODIUM CHLORIDE 0.9 % IV SOLN: INTRAVENOUS | Status: DC

## 2022-03-15 MED ORDER — DEXAMETHASONE SODIUM PHOSPHATE 10 MG/ML IJ SOLN: 10.0000 mg | Freq: Once | INTRAMUSCULAR | Status: DC

## 2022-03-15 MED ORDER — DEXAMETHASONE SODIUM PHOSPHATE 10 MG/ML IJ SOLN
10.0000 mg | Freq: Once | INTRAMUSCULAR | Status: AC
Start: 2022-03-15 — End: 2022-03-15
  Administered 2022-03-15: 10 mg via INTRAVENOUS

## 2022-03-15 MED ORDER — RACEPINEPHRINE HCL 2.25 % IN NEBU: 0.5000 mL | INHALATION_SOLUTION | Freq: Once | RESPIRATORY_TRACT | Status: DC

## 2022-03-15 MED ORDER — DIPHENHYDRAMINE HCL 50 MG/ML IJ SOLN
25.0000 mg | Freq: Once | INTRAMUSCULAR | Status: AC
  Administered 2022-03-15: 25 mg via INTRAVENOUS
  Filled 2022-03-15: qty 1

## 2022-03-15 MED ORDER — FAMOTIDINE IN NACL 20-0.9 MG/50ML-% IV SOLN
20.0000 mg | Freq: Once | INTRAVENOUS | Status: AC
  Administered 2022-03-15: 20 mg via INTRAVENOUS
  Filled 2022-03-15: qty 50

## 2022-03-15 MED ORDER — IOHEXOL 300 MG/ML  SOLN
75.0000 mL | Freq: Once | INTRAMUSCULAR | Status: AC | PRN
  Administered 2022-03-15: 75 mL via INTRAVENOUS

## 2022-03-15 NOTE — ED Notes (Signed)
Pt having laryngospasm, feels like he can not get his breath and can not swallow.  +anxious, RT and EDP at bedside. ?Breathing tx given, IV and labs obtained.  ?

## 2022-03-15 NOTE — Progress Notes (Signed)
Patient SPO2 is 97% on room air.  Patient states that he is breathing much better and feeling better. ?

## 2022-03-15 NOTE — ED Notes (Signed)
Patient transported to CT 

## 2022-03-15 NOTE — ED Notes (Signed)
Pt's breathing has improved, VSS labs are WNLS ?Ice chips given ?

## 2022-03-15 NOTE — ED Provider Notes (Signed)
?[facility] ?Provider Note ? ? ?CSN: [id number] ?Arrival date & time: 03/15/22  1901 ? ?  ? ?History ? ?Chief Complaint  ?Patient presents with  ? laryngospasms  ? ? ?Bruce Woods is a 61 y.o. male. ? ?Patient was feeling fine yesterday.  Today started to develop kind of a sore throat and there was some sort of mucus and phlegm and when he arrived here he felt as if his throat was closing off.  Got very severe here but he states kind of on saw it was about 2 hours prior.  He was able to talk with it no stridor no wheezing.  But he just felt like things were closing off.  No unusual foods no hives no history of any allergic reaction.  Patient clearly was in distress when he arrived.  But it was reassuring that he could talk in the voice was not terribly hoarse. ? ?Past medical history significant for reflux disease COVID-19 in September 2022.  Irritable bowel syndrome.  HIV disease on antivirals.  Patient's had tonsillectomy and adenoidectomy in the past.  Patient is a former smoker quit in 1985. ? ? ?  ? ?Home Medications ?Prior to Admission medications   ?Medication Sig Start Date End Date Taking? Authorizing Provider  ?amLODipine (NORVASC) 5 MG tablet Take 1 tablet (5 mg total) by mouth daily. 08/18/21   Colon Branch, MD  ?bictegravir-emtricitabine-tenofovir AF (BIKTARVY) 50-200-25 MG TABS tablet TAKE 1 TABLET BY MOUTH DAILY. 12/06/21 12/06/22  Michel Bickers, MD  ?citalopram (CELEXA) 20 MG tablet Take 1 tablet (20 mg total) by mouth daily. 02/09/22   Colon Branch, MD  ?methocarbamol (ROBAXIN) 500 MG tablet 500 mg as needed.    [provider]  ?pantoprazole (PROTONIX) 40 MG tablet Take 1 tablet (40 mg total) by mouth 2 (two) times daily. ?Patient taking differently: Take 40 mg by mouth daily. 11/23/21   Gatha Mayer, MD  ?sildenafil (REVATIO) 20 MG tablet TAKE 3-4 TABLETS (60-80 MG TOTAL) BY MOUTH AT BEDTIME AS NEEDED. 10/26/21 10/26/22  Colon Branch, MD  ?tadalafil (CIALIS) 5  MG tablet Take 1 tablet by mouth once daily 03/04/22     ?Vitamin D, Ergocalciferol, (DRISDOL) 1.25 MG (50000 UNIT) CAPS capsule Take 1 capsule (50,000 Units total) by mouth every 7 (seven) days. 01/18/22   Colon Branch, MD  ?zolpidem (AMBIEN) 10 MG tablet TAKE 1 TABLET BY MOUTH EVERY NIGHT AT BEDTIME AS NEEDED FOR SLEEP 10/26/21 04/24/22  Colon Branch, MD  ?   ? ?Allergies    ?Morphine, Doxycycline, Celebrex [celecoxib], and Wellbutrin [bupropion]   ? ?Review of Systems   ?Review of Systems  ?Constitutional:  Negative for chills and fever.  ?HENT:  Positive for congestion, sore throat and trouble swallowing. Negative for ear pain.   ?Eyes:  Negative for pain and visual disturbance.  ?Respiratory:  Positive for shortness of breath. Negative for cough.   ?Cardiovascular:  Negative for chest pain and palpitations.  ?Gastrointestinal:  Negative for abdominal pain and vomiting.  ?Genitourinary:  Negative for dysuria and hematuria.  ?Musculoskeletal:  Negative for arthralgias and back pain.  ?Skin:  Negative for color change and rash.  ?Neurological:  Negative for seizures and syncope.  ?All other systems reviewed and are negative. ? ?Physical Exam ?Updated Vital Signs ?BP 139/82   Pulse 73   Temp 98 ?F (36.7 ?C) (Oral)   Resp 19   SpO2 100%  ?Physical Exam ?Vitals and nursing  note reviewed.  ?Constitutional:   ?   General: He is in acute distress.  ?   Appearance: Normal appearance. He is well-developed. He is ill-appearing and diaphoretic.  ?HENT:  ?   Head: Normocephalic and atraumatic.  ?   Mouth/Throat:  ?   Pharynx: Posterior oropharyngeal erythema present. No oropharyngeal exudate.  ?   Comments: No tongue swelling.  Uvula not significantly enlarged.  No exudate.  Some erythema.  Mucous membranes moist. ?Eyes:  ?   Extraocular Movements: Extraocular movements intact.  ?   Conjunctiva/sclera: Conjunctivae normal.  ?   Pupils: Pupils are equal, round, and reactive to light.  ?Cardiovascular:  ?   Rate and Rhythm:  Normal rate and regular rhythm.  ?   Heart sounds: No murmur heard. ?Pulmonary:  ?   Effort: Respiratory distress present.  ?   Breath sounds: Normal breath sounds.  ?Abdominal:  ?   Palpations: Abdomen is soft.  ?   Tenderness: There is no abdominal tenderness.  ?Musculoskeletal:     ?   General: No swelling.  ?   Cervical back: Normal range of motion and neck supple.  ?Lymphadenopathy:  ?   Cervical: No cervical adenopathy.  ?Skin: ?   General: Skin is warm.  ?   Capillary Refill: Capillary refill takes less than 2 seconds.  ?Neurological:  ?   General: No focal deficit present.  ?   Mental Status: He is alert and oriented to person, place, and time.  ?   Cranial Nerves: No cranial nerve deficit.  ?   Sensory: No sensory deficit.  ?   Motor: No weakness.  ?Psychiatric:     ?   Mood and Affect: Mood normal.  ? ? ?ED Results / Procedures / Treatments   ?Labs ?(all labs ordered are listed, but only abnormal results are displayed) ?Labs Reviewed  ?COMPREHENSIVE METABOLIC PANEL - Abnormal; Notable for the following components:  ?    Result Value  ? Creatinine, Ser 1.38 (*)   ? GFR, Estimated 57 (*)   ? All other components within normal limits  ?RESP PANEL BY RT-PCR (FLU A&B, COVID) ARPGX2  ?GROUP A STREP BY PCR  ?CULTURE, BLOOD (ROUTINE X 2)  ?CULTURE, BLOOD (ROUTINE X 2)  ?LIPASE, BLOOD  ?CBC WITH DIFFERENTIAL/PLATELET  ?LACTIC ACID, PLASMA  ?URINALYSIS, ROUTINE W REFLEX MICROSCOPIC  ?LACTIC ACID, PLASMA  ? ? ?EKG ?EKG Interpretation ? ?Date/Time:  Tuesday Mar 15 2022 19:09:21 EDT ?Ventricular Rate:  94 ?PR Interval:  217 ?QRS Duration: 108 ?QT Interval:  344 ?QTC Calculation: 431 ?R Axis:   39 ?Text Interpretation: Sinus rhythm Prolonged PR interval Borderline low voltage, extremity leads Baseline wander in lead(s) V2 Confirmed by Fredia Sorrow [phone]) on 03/15/2022 7:31:40 PM ? ?Radiology ?CT Soft Tissue Neck W Contrast ? ?Result Date: 03/15/2022 ?CLINICAL DATA:  Epiglottitis or tonsillitis EXAM: CT NECK WITH  CONTRAST TECHNIQUE: Multidetector CT imaging of the neck was performed using the standard protocol following the bolus administration of intravenous contrast. RADIATION DOSE REDUCTION: This exam was performed according to the departmental dose-optimization program which includes automated exposure control, adjustment of the mA and/or kV according to patient size and/or use of iterative reconstruction technique. CONTRAST:  95m OMNIPAQUE IOHEXOL 300 MG/ML  SOLN COMPARISON:  None Available. FINDINGS: Suspected PHARYNX AND LARYNX: The nasopharynx, oropharynx and larynx are normal. Visible portions of the oral cavity, tongue base and floor of mouth are normal. Normal epiglottis, vallecula and pyriform sinuses. The larynx is normal. No  retropharyngeal abscess, effusion or lymphadenopathy. SALIVARY GLANDS: Normal parotid, submandibular and sublingual glands. THYROID: Normal. LYMPH NODES: No enlarged or abnormal density lymph nodes. VASCULAR: Major cervical vessels are patent. LIMITED INTRACRANIAL: Normal. VISUALIZED ORBITS: Normal. MASTOIDS AND VISUALIZED PARANASAL SINUSES: No fluid levels or advanced mucosal thickening. No mastoid effusion. SKELETON: No bony spinal canal stenosis. No lytic or blastic lesions. UPPER CHEST: Clear. OTHER: None. IMPRESSION: Normal CT of the neck. Electronically Signed   By: Ulyses Jarred M.D.   On: 03/15/2022 20:45  ? ?DG Chest Port 1 View ? ?Result Date: 03/15/2022 ?CLINICAL DATA:  Shortness of breath. EXAM: PORTABLE CHEST 1 VIEW COMPARISON:  Chest x-ray 09/21/2021 FINDINGS: The heart size and mediastinal contours are within normal limits. Both lungs are clear. The visualized skeletal structures are unremarkable. IMPRESSION: No active disease. Electronically Signed   By: Ronney Asters M.D.   On: 03/15/2022 19:30   ? ?Procedures ?Procedures  ? ? ?Medications Ordered in ED ?Medications  ?Racepinephrine HCl 2.25 % nebulizer solution 0.5 mL (0.5 mLs Nebulization Not Given 03/15/22 1930)  ?0.9 %   sodium chloride infusion (has no administration in time range)  ?Racepinephrine HCl 2.25 % nebulizer solution 0.5 mL (0.5 mLs Nebulization Not Given 03/15/22 1933)  ?dexamethasone (DECADRON) injection 10 mg (10 m

## 2022-03-15 NOTE — Discharge Instructions (Signed)
Continue to take Pepcid twice a day at home.  And take Benadryl as needed every 6 hours.  You received Decadron here so that steroid will last for a period of time.  Return for any new or worse symptoms.  Work-up here today without any significant findings which is reassuring.  Follow-up with your doctor as needed. ?

## 2022-03-15 NOTE — ED Notes (Signed)
Pt becoming anxious, states he feels as if "that feeling is coming back" ?RT at bedside, VSS, EDP at bedside,   ?

## 2022-03-15 NOTE — Progress Notes (Signed)
Patient in on an aerosol face tent with humidity with medical air.  RT will continue to monitor. ?

## 2022-03-15 NOTE — ED Triage Notes (Signed)
Pt reports laryngeal spasms x 2 hours. Earlier today had sore throat. Pt reports n/v, difficulty breathing, difficulty swallowing, and narrowing airway. MD in triage to assess.  ?

## 2022-03-16 ENCOUNTER — Ambulatory Visit (HOSPITAL_BASED_OUTPATIENT_CLINIC_OR_DEPARTMENT_OTHER): Payer: 59 | Admitting: Family Medicine

## 2022-03-17 ENCOUNTER — Other Ambulatory Visit (HOSPITAL_BASED_OUTPATIENT_CLINIC_OR_DEPARTMENT_OTHER): Payer: Self-pay

## 2022-03-17 MED ORDER — NATESTO 5.5 MG/ACT NA GEL
NASAL | 5 refills | Status: DC
  Filled 2022-03-17: qty 7.32, 30d supply, fill #0
  Filled 2022-03-17: qty 21.96, 60d supply, fill #0

## 2022-03-18 ENCOUNTER — Other Ambulatory Visit (HOSPITAL_BASED_OUTPATIENT_CLINIC_OR_DEPARTMENT_OTHER): Payer: Self-pay

## 2022-03-20 LAB — CULTURE, BLOOD (ROUTINE X 2)
Culture: NO GROWTH
Culture: NO GROWTH
Special Requests: ADEQUATE
Special Requests: ADEQUATE

## 2022-03-21 ENCOUNTER — Other Ambulatory Visit (HOSPITAL_BASED_OUTPATIENT_CLINIC_OR_DEPARTMENT_OTHER): Payer: Self-pay

## 2022-03-22 ENCOUNTER — Other Ambulatory Visit (HOSPITAL_BASED_OUTPATIENT_CLINIC_OR_DEPARTMENT_OTHER): Payer: Self-pay

## 2022-03-23 ENCOUNTER — Other Ambulatory Visit (HOSPITAL_BASED_OUTPATIENT_CLINIC_OR_DEPARTMENT_OTHER): Payer: Self-pay

## 2022-03-24 ENCOUNTER — Other Ambulatory Visit (HOSPITAL_BASED_OUTPATIENT_CLINIC_OR_DEPARTMENT_OTHER): Payer: Self-pay

## 2022-03-29 ENCOUNTER — Other Ambulatory Visit (HOSPITAL_BASED_OUTPATIENT_CLINIC_OR_DEPARTMENT_OTHER): Payer: Self-pay

## 2022-03-30 ENCOUNTER — Other Ambulatory Visit (HOSPITAL_BASED_OUTPATIENT_CLINIC_OR_DEPARTMENT_OTHER): Payer: Self-pay

## 2022-03-30 MED ORDER — TESTOSTERONE CYPIONATE 200 MG/ML IM SOLN
INTRAMUSCULAR | 0 refills | Status: DC
  Filled 2022-03-30: qty 10, 28d supply, fill #0

## 2022-03-31 ENCOUNTER — Other Ambulatory Visit (HOSPITAL_BASED_OUTPATIENT_CLINIC_OR_DEPARTMENT_OTHER): Payer: Self-pay

## 2022-03-31 ENCOUNTER — Other Ambulatory Visit (HOSPITAL_COMMUNITY): Payer: Self-pay

## 2022-04-11 ENCOUNTER — Other Ambulatory Visit (HOSPITAL_COMMUNITY): Payer: Self-pay

## 2022-04-18 ENCOUNTER — Other Ambulatory Visit (HOSPITAL_BASED_OUTPATIENT_CLINIC_OR_DEPARTMENT_OTHER): Payer: Self-pay

## 2022-04-26 DIAGNOSIS — E291 Testicular hypofunction: Secondary | ICD-10-CM | POA: Diagnosis not present

## 2022-04-27 ENCOUNTER — Other Ambulatory Visit (HOSPITAL_BASED_OUTPATIENT_CLINIC_OR_DEPARTMENT_OTHER): Payer: Self-pay

## 2022-04-28 ENCOUNTER — Other Ambulatory Visit (HOSPITAL_BASED_OUTPATIENT_CLINIC_OR_DEPARTMENT_OTHER): Payer: Self-pay

## 2022-04-28 ENCOUNTER — Other Ambulatory Visit (HOSPITAL_COMMUNITY): Payer: Self-pay

## 2022-04-28 MED ORDER — ANASTROZOLE 1 MG PO TABS
ORAL_TABLET | ORAL | 1 refills | Status: DC
  Filled 2022-04-28: qty 12, 84d supply, fill #0
  Filled 2022-10-10: qty 12, 84d supply, fill #1

## 2022-04-28 MED ORDER — TESTOSTERONE CYPIONATE 200 MG/ML IM SOLN
INTRAMUSCULAR | 0 refills | Status: DC
  Filled 2022-04-28: qty 10, 28d supply, fill #0

## 2022-05-11 ENCOUNTER — Other Ambulatory Visit (HOSPITAL_COMMUNITY): Payer: Self-pay

## 2022-05-12 DIAGNOSIS — F902 Attention-deficit hyperactivity disorder, combined type: Secondary | ICD-10-CM | POA: Diagnosis not present

## 2022-05-12 DIAGNOSIS — F411 Generalized anxiety disorder: Secondary | ICD-10-CM | POA: Diagnosis not present

## 2022-05-12 DIAGNOSIS — F319 Bipolar disorder, unspecified: Secondary | ICD-10-CM | POA: Diagnosis not present

## 2022-05-18 ENCOUNTER — Other Ambulatory Visit (HOSPITAL_BASED_OUTPATIENT_CLINIC_OR_DEPARTMENT_OTHER): Payer: Self-pay

## 2022-05-18 ENCOUNTER — Other Ambulatory Visit: Payer: Self-pay | Admitting: Internal Medicine

## 2022-05-18 MED ORDER — AMLODIPINE BESYLATE 5 MG PO TABS
5.0000 mg | ORAL_TABLET | Freq: Every day | ORAL | 2 refills | Status: DC
  Filled 2022-05-18: qty 90, 90d supply, fill #0

## 2022-05-26 DIAGNOSIS — F902 Attention-deficit hyperactivity disorder, combined type: Secondary | ICD-10-CM | POA: Diagnosis not present

## 2022-05-26 DIAGNOSIS — F411 Generalized anxiety disorder: Secondary | ICD-10-CM | POA: Diagnosis not present

## 2022-05-27 ENCOUNTER — Other Ambulatory Visit: Payer: Self-pay | Admitting: Internal Medicine

## 2022-05-27 ENCOUNTER — Other Ambulatory Visit (HOSPITAL_COMMUNITY): Payer: Self-pay

## 2022-05-27 DIAGNOSIS — B20 Human immunodeficiency virus [HIV] disease: Secondary | ICD-10-CM

## 2022-05-27 MED ORDER — BIKTARVY 50-200-25 MG PO TABS
1.0000 | ORAL_TABLET | Freq: Every day | ORAL | 5 refills | Status: DC
  Filled 2022-05-27: qty 30, 30d supply, fill #0
  Filled 2022-06-28: qty 30, 30d supply, fill #1
  Filled 2022-07-28 (×2): qty 30, 30d supply, fill #2
  Filled 2022-09-07: qty 30, 30d supply, fill #3
  Filled 2022-10-03: qty 30, 30d supply, fill #4

## 2022-05-30 ENCOUNTER — Encounter: Payer: Self-pay | Admitting: Internal Medicine

## 2022-05-30 ENCOUNTER — Emergency Department (HOSPITAL_BASED_OUTPATIENT_CLINIC_OR_DEPARTMENT_OTHER): Payer: 59

## 2022-05-30 ENCOUNTER — Emergency Department (HOSPITAL_BASED_OUTPATIENT_CLINIC_OR_DEPARTMENT_OTHER)
Admission: EM | Admit: 2022-05-30 | Discharge: 2022-05-30 | Disposition: A | Discharge status: Home / Self Care | Payer: 59 | Attending: Emergency Medicine | Admitting: Emergency Medicine

## 2022-05-30 ENCOUNTER — Other Ambulatory Visit: Payer: Self-pay

## 2022-05-30 ENCOUNTER — Encounter (HOSPITAL_BASED_OUTPATIENT_CLINIC_OR_DEPARTMENT_OTHER): Payer: Self-pay | Admitting: Emergency Medicine

## 2022-05-30 DIAGNOSIS — R079 Chest pain, unspecified: Secondary | ICD-10-CM | POA: Diagnosis not present

## 2022-05-30 DIAGNOSIS — R109 Unspecified abdominal pain: Secondary | ICD-10-CM | POA: Diagnosis not present

## 2022-05-30 DIAGNOSIS — Z21 Asymptomatic human immunodeficiency virus [HIV] infection status: Secondary | ICD-10-CM | POA: Diagnosis not present

## 2022-05-30 DIAGNOSIS — Z79899 Other long term (current) drug therapy: Secondary | ICD-10-CM | POA: Insufficient documentation

## 2022-05-30 DIAGNOSIS — R1013 Epigastric pain: Secondary | ICD-10-CM | POA: Diagnosis not present

## 2022-05-30 DIAGNOSIS — R1011 Right upper quadrant pain: Secondary | ICD-10-CM | POA: Diagnosis not present

## 2022-05-30 DIAGNOSIS — R9431 Abnormal electrocardiogram [ECG] [EKG]: Secondary | ICD-10-CM | POA: Diagnosis not present

## 2022-05-30 LAB — TROPONIN I (HIGH SENSITIVITY)
Troponin I (High Sensitivity): 6 ng/L (ref <18)
Troponin I (High Sensitivity): 6 ng/L (ref <18)

## 2022-05-30 LAB — BASIC METABOLIC PANEL
Anion gap: 10 (ref 5–15)
BUN: 14 mg/dL (ref 8–23)
CO2: 29 mmol/L (ref 22–32)
Calcium: 9.6 mg/dL (ref 8.9–10.3)
Chloride: 101 mmol/L (ref 98–111)
Creatinine, Ser: 1.54 mg/dL — ABNORMAL HIGH (ref 0.61–1.24)
GFR, Estimated: 50 mL/min — ABNORMAL LOW (ref >60)
Glucose, Bld: 93 mg/dL (ref 70–99)
Potassium: 3.5 mmol/L (ref 3.5–5.1)
Sodium: 140 mmol/L (ref 135–145)

## 2022-05-30 LAB — CBC
HCT: 52.3 % — ABNORMAL HIGH (ref 39.0–52.0)
Hemoglobin: 18.3 g/dL — ABNORMAL HIGH (ref 13.0–17.0)
MCH: 31.2 pg (ref 26.0–34.0)
MCHC: 35 g/dL (ref 30.0–36.0)
MCV: 89.1 fL (ref 80.0–100.0)
Platelets: 252 10*3/uL (ref 150–400)
RBC: 5.87 MIL/uL — ABNORMAL HIGH (ref 4.22–5.81)
RDW: 12.9 % (ref 11.5–15.5)
WBC: 13.3 10*3/uL — ABNORMAL HIGH (ref 4.0–10.5)
nRBC: 0 % (ref 0.0–0.2)

## 2022-05-30 LAB — HEPATIC FUNCTION PANEL
ALT: 21 U/L (ref 0–44)
AST: 23 U/L (ref 15–41)
Albumin: 4.9 g/dL (ref 3.5–5.0)
Alkaline Phosphatase: 75 U/L (ref 38–126)
Bilirubin, Direct: 0.2 mg/dL (ref 0.0–0.2)
Indirect Bilirubin: 1 mg/dL — ABNORMAL HIGH (ref 0.3–0.9)
Total Bilirubin: 1.2 mg/dL (ref 0.3–1.2)
Total Protein: 8.4 g/dL — ABNORMAL HIGH (ref 6.5–8.1)

## 2022-05-30 LAB — LIPASE, BLOOD: Lipase: 35 U/L (ref 11–51)

## 2022-05-30 MED ORDER — SODIUM CHLORIDE 0.9 % IV BOLUS
1000.0000 mL | Freq: Once | INTRAVENOUS | Status: AC
  Administered 2022-05-30: 1000 mL via INTRAVENOUS

## 2022-05-30 MED ORDER — IOHEXOL 300 MG/ML  SOLN
100.0000 mL | Freq: Once | INTRAMUSCULAR | Status: AC | PRN
  Administered 2022-05-30: 100 mL via INTRAVENOUS

## 2022-05-30 MED ORDER — ONDANSETRON 4 MG PO TBDP
4.0000 mg | ORAL_TABLET | Freq: Three times a day (TID) | ORAL | 0 refills | Status: DC | PRN
Start: 2022-05-30 — End: 2023-01-16
  Filled 2022-05-30: qty 20, 7d supply, fill #0

## 2022-05-30 MED ORDER — FAMOTIDINE IN NACL 20-0.9 MG/50ML-% IV SOLN
20.0000 mg | Freq: Once | INTRAVENOUS | Status: AC
Start: 2022-05-30 — End: 2022-05-30
  Administered 2022-05-30: 20 mg via INTRAVENOUS
  Filled 2022-05-30: qty 50

## 2022-05-30 MED ORDER — ONDANSETRON HCL 4 MG/2ML IJ SOLN
4.0000 mg | Freq: Once | INTRAMUSCULAR | Status: AC
  Administered 2022-05-30: 4 mg via INTRAVENOUS
  Filled 2022-05-30: qty 2

## 2022-05-30 MED ORDER — LORAZEPAM 1 MG PO TABS
1.0000 mg | ORAL_TABLET | Freq: Once | ORAL | Status: AC
  Administered 2022-05-30: 1 mg via ORAL
  Filled 2022-05-30: qty 1

## 2022-05-30 MED ORDER — FENTANYL CITRATE PF 50 MCG/ML IJ SOSY
100.0000 ug | PREFILLED_SYRINGE | Freq: Once | INTRAMUSCULAR | Status: AC
  Administered 2022-05-30: 100 ug via INTRAVENOUS
  Filled 2022-05-30: qty 2

## 2022-05-30 MED ORDER — LORAZEPAM 1 MG PO TABS
1.0000 mg | ORAL_TABLET | Freq: Three times a day (TID) | ORAL | 0 refills | Status: DC | PRN
  Filled 2022-05-30: qty 10, 4d supply, fill #0

## 2022-05-30 MED ORDER — HYDROCODONE-ACETAMINOPHEN 5-325 MG PO TABS
1.0000 | ORAL_TABLET | ORAL | 0 refills | Status: DC | PRN
  Filled 2022-05-30: qty 10, 2d supply, fill #0

## 2022-05-30 MED ORDER — LORAZEPAM 2 MG/ML IJ SOLN
1.0000 mg | Freq: Once | INTRAMUSCULAR | Status: AC
  Administered 2022-05-30: 1 mg via INTRAVENOUS
  Filled 2022-05-30: qty 1

## 2022-05-30 NOTE — ED Notes (Signed)
Patient transported to CT 

## 2022-05-30 NOTE — ED Provider Notes (Signed)
[facility] Provider Note   CSN: [id number] Arrival date & time: 05/30/22  1525     History  Chief Complaint  Patient presents with   Abdominal Pain    Bruce Woods is a 62 y.o. male.  Pt is a 61 yo male with pmhx significant for HIV, GERD, Depression, and gastritis.  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  He has had problems with gastritis in the past.  He was seen in the ED in November for the same.  He follows with Dr. Carlean Purl who did an EGD in December of 2022 which showed:  EGD December 2022   - Non-obstructing Schatzki ring. Biopsied. - 3 cm hiatal hernia. - Gastroesophageal flap valve classified as Hill Grade III (minimal fold, loose to endoscope, hiatal hernia likely). - Gastritis. Biopsied. - Non-bleeding duodenal diverticulum. - Normal mucosa was found in the duodenal bulb, in the first portion of the duodenum and in the second portion of the duodenum. Biopsied Diagnosis 1. Surgical [P], duodenum - DUODENAL MUCOSA WITH NO SPECIFIC HISTOPATHOLOGIC CHANGES - NEGATIVE FOR INCREASED INTRAEPITHELIAL LYMPHOCYTES OR VILLOUS ARCHITECTURAL CHANGES 2. Surgical [P], gastric anturm and gastric body - GASTRIC ANTRAL AND OXYNTIC MUCOSA WITH MILD CHRONIC GASTRITIS - WARTHIN STARRY STAIN IS NEGATIVE FOR HELICOBACTER PYLORI 3. Surgical [P], colon, GE junction - ESOPHAGEAL SQUAMOUS AND CARDIAC MUCOSA WITH REFLUX-ASSOCIATED CHANGES - NEGATIVE FOR INTESTINAL METAPLASIA OR DYSPLASIA  Pt said he had mostly weaned himself off his meds, but started to take them again a few days ago because of pain.  He ate a tenderloin biscuit this am around 0900 and immediately had severe pain. He took a GI cocktail which did not really help. He developed worsening pain throughout the day.  He tried to continue to work, but developed vomiting and pain was uncontrollable.  He said something feels like there is a knot in his epigastrium.             Home  Medications Prior to Admission medications   Medication Sig Start Date End Date Taking? Authorizing Provider  HYDROcodone-acetaminophen (NORCO/VICODIN) 5-325 MG tablet Take 1 tablet by mouth every 4 (four) hours as needed. 05/30/22  Yes Isla Pence, MD  LORazepam (ATIVAN) 1 MG tablet Take 1 tablet (1 mg total) by mouth 3 (three) times daily as needed (spasms). 05/30/22  Yes Isla Pence, MD  ondansetron (ZOFRAN-ODT) 4 MG disintegrating tablet Take 1 tablet (4 mg total) by mouth every 8 (eight) hours as needed for nausea or vomiting. 05/30/22  Yes Isla Pence, MD  amLODipine (NORVASC) 5 MG tablet Take 1 tablet (5 mg total) by mouth daily. 05/18/22   Colon Branch, MD  anastrozole (ARIMIDEX) 1 MG tablet Take 1 tablet by mouth once weekly 04/28/22     bictegravir-emtricitabine-tenofovir AF (BIKTARVY) 50-200-25 MG TABS tablet TAKE 1 TABLET BY MOUTH DAILY. 05/27/22 05/27/23  Michel Bickers, MD  citalopram (CELEXA) 20 MG tablet Take 1 tablet (20 mg total) by mouth daily. 02/09/22   Colon Branch, MD  methocarbamol (ROBAXIN) 500 MG tablet 500 mg as needed.    [provider]  pantoprazole (PROTONIX) 40 MG tablet Take 1 tablet (40 mg total) by mouth 2 (two) times daily. Patient taking differently: Take 40 mg by mouth daily. 11/23/21   Gatha Mayer, MD  sildenafil (REVATIO) 20 MG tablet TAKE 3-4 TABLETS (60-80 MG TOTAL) BY MOUTH AT BEDTIME AS NEEDED. 10/26/21 10/26/22  Colon Branch, MD  tadalafil (CIALIS) 5 MG  tablet Take 1 tablet by mouth once daily 03/04/22     Testosterone (NATESTO) 5.5 MG/ACT GEL Instill 1 spray into each nostril three times daily as directed 03/17/22     testosterone cypionate (DEPOTESTOSTERONE CYPIONATE) 200 MG/ML injection Inject 0.64m into the muscle once weekly 04/27/22     Vitamin D, Ergocalciferol, (DRISDOL) 1.25 MG (50000 UNIT) CAPS capsule Take 1 capsule (50,000 Units total) by mouth every 7 (seven) days. 01/18/22   PColon Branch MD  zolpidem (AMBIEN) 10 MG tablet TAKE 1  TABLET BY MOUTH EVERY NIGHT AT BEDTIME AS NEEDED FOR SLEEP 10/26/21 04/24/22  PColon Branch MD      Allergies    Morphine, Doxycycline, Celebrex [celecoxib], and Wellbutrin [bupropion]    Review of Systems   Review of Systems  Gastrointestinal:  Positive for abdominal pain.  All other systems reviewed and are negative.   Physical Exam Updated Vital Signs BP 134/87 (BP Location: Right Arm)   Pulse (!) 52   Temp 97.6 F (36.4 C) (Oral)   Resp 13   Wt 90.7 kg   SpO2 93%   BMI 30.41 kg/m  Physical Exam Vitals and nursing note reviewed.  Constitutional:      General: He is in acute distress.     Appearance: He is ill-appearing.  HENT:     Head: Normocephalic and atraumatic.     Mouth/Throat:     Mouth: Mucous membranes are moist.     Pharynx: Oropharynx is clear.  Eyes:     Extraocular Movements: Extraocular movements intact.     Pupils: Pupils are equal, round, and reactive to light.  Cardiovascular:     Rate and Rhythm: Normal rate and regular rhythm.  Pulmonary:     Effort: Pulmonary effort is normal.     Breath sounds: Normal breath sounds.  Abdominal:     General: Abdomen is flat. Bowel sounds are normal.     Tenderness: There is abdominal tenderness in the epigastric area.  Skin:    General: Skin is warm.     Capillary Refill: Capillary refill takes less than 2 seconds.  Neurological:     General: No focal deficit present.     Mental Status: He is alert and oriented to person, place, and time.  Psychiatric:        Mood and Affect: Mood normal.        Behavior: Behavior normal.     ED Results / Procedures / Treatments   Labs (all labs ordered are listed, but only abnormal results are displayed) Labs Reviewed  BASIC METABOLIC PANEL - Abnormal; Notable for the following components:      Result Value   Creatinine, Ser 1.54 (*)    GFR, Estimated 50 (*)    All other components within normal limits  CBC - Abnormal; Notable for the following components:   WBC  13.3 (*)    RBC 5.87 (*)    Hemoglobin 18.3 (*)    HCT 52.3 (*)    All other components within normal limits  HEPATIC FUNCTION PANEL - Abnormal; Notable for the following components:   Total Protein 8.4 (*)    Indirect Bilirubin 1.0 (*)    All other components within normal limits  LIPASE, BLOOD  TROPONIN I (HIGH SENSITIVITY)  TROPONIN I (HIGH SENSITIVITY)    EKG EKG Interpretation  Date/Time:  Monday May 30 2022 15:45:20 EDT Ventricular Rate:  72 PR Interval:  224 QRS Duration: 86 QT Interval:  373 QTC Calculation:  409 R Axis:   12 Text Interpretation: Sinus rhythm Prolonged PR interval Prominent P waves, nondiagnostic No significant change since last tracing Confirmed by Isla Pence [phone]) on 05/30/2022 5:44:15 PM  Radiology CT ABDOMEN PELVIS W CONTRAST  Result Date: 05/30/2022 CLINICAL DATA:  Abdominal pain, acute nonlocalized EXAM: CT ABDOMEN AND PELVIS WITH CONTRAST TECHNIQUE: Multidetector CT imaging of the abdomen and pelvis was performed using the standard protocol following bolus administration of intravenous contrast. RADIATION DOSE REDUCTION: This exam was performed according to the departmental dose-optimization program which includes automated exposure control, adjustment of the mA and/or kV according to patient size and/or use of iterative reconstruction technique. CONTRAST:  164m OMNIPAQUE IOHEXOL 300 MG/ML  SOLN COMPARISON:  None Available. FINDINGS: Lower chest: Lung bases are clear. Hepatobiliary: No focal hepatic lesion. No biliary duct dilatation. Common bile duct is normal. Pancreas: Pancreas is normal. No ductal dilatation. No pancreatic inflammation. Spleen: Normal spleen Adrenals/urinary tract: Adrenal glands and kidneys are normal. The ureters and bladder normal. Stomach/Bowel: Stomach and duodenum are normal. Proximal small bowel is normal. Within the mid to distal ileum there is a long segment of submucosal edema involving the small bowel (image 65/2 for  example). Small amount fluid within the mildly distended abnormal small bowel. There is no distension of the small bowel to suggest obstruction. No pneumatosis. The colon and rectosigmoid colon are normal. Vascular/Lymphatic: Abdominal aorta is normal caliber. No periportal or retroperitoneal adenopathy. No pelvic adenopathy. Reproductive: Prostate normal Other: No free fluid. Musculoskeletal: No aggressive osseous lesion. IMPRESSION: 1. Long segment of small bowel submucosal edema involving the mid to distal ileum. No evidence of high-grade obstruction. Findings suggest regional enteritis versus inflammatory bowel disease or less likely connective tissue disease. Ischemia not favored as no atherosclerotic disease of the mesenteric arteries. 2. Terminal ileum appears normal.  Colon appears normal. Electronically Signed   By: SSuzy BouchardM.D.   On: 05/30/2022 18:25   [place]Abdomen Limited RUQ (LIVER/GB)  Result Date: 05/30/2022 CLINICAL DATA:  Right upper quadrant pain EXAM: ULTRASOUND ABDOMEN LIMITED RIGHT UPPER QUADRANT COMPARISON:  Correlation is made with a CT of the abdomen and pelvis dated September 21, 2021 FINDINGS: Gallbladder: No gallstones or wall thickening visualized. Gallbladder wall measures 2.1 mm. No pericholecystic fluid. No sonographic Murphy sign noted by sonographer. Common bile duct: Diameter: 1.8 mm Liver: Left lobe of the liver is not well identified due to overlying bowel gas. Remainder of the liver shows mild increased echogenicity of likely hepatic steatosis. No focal lesion identified. Portal vein is patent on color Doppler imaging with normal direction of blood flow towards the liver. Other: None. IMPRESSION: No cholecystitis or cholelithiasis. CBD is withi normal limits. Likely hepatic steatosis. Left lobe of the liver is not well delineated due to overlying bowel-gas. Electronically Signed   By: AFrazier RichardsM.D.   On: 05/30/2022 17:02   DG Chest Port 1 View  Result Date:  05/30/2022 CLINICAL DATA:  Chest pain EXAM: PORTABLE CHEST 1 VIEW COMPARISON:  Portable exam 1540 hours compared to 03/15/2022 FINDINGS: Normal heart size, mediastinal contours, and pulmonary vascularity. Lungs clear. No infiltrate, pleural effusion, or pneumothorax. Osseous structures unremarkable. IMPRESSION: No acute abnormalities. Electronically Signed   By: MLavonia DanaM.D.   On: 05/30/2022 16:10    Procedures Procedures    Medications Ordered in ED Medications  LORazepam (ATIVAN) tablet 1 mg (has no administration in time range)  sodium chloride 0.9 % bolus 1,000 mL (0 mLs Intravenous Stopped 05/30/22 1750)  ondansetron (ZOFRAN) injection 4 mg (4 mg Intravenous Given 05/30/22 1554)  fentaNYL (SUBLIMAZE) injection 100 mcg (100 mcg Intravenous Given 05/30/22 1550)  famotidine (PEPCID) IVPB 20 mg premix (0 mg Intravenous Stopped 05/30/22 1559)  LORazepam (ATIVAN) injection 1 mg (1 mg Intravenous Given 05/30/22 1626)  iohexol (OMNIPAQUE) 300 MG/ML solution 100 mL (100 mLs Intravenous Contrast Given 05/30/22 1811)  sodium chloride 0.9 % bolus 1,000 mL (1,000 mLs Intravenous New Bag/Given 05/30/22 1908)    ED Course/ Medical Decision Making/ A&P                           Medical Decision Making Amount and/or Complexity of Data Reviewed Labs: ordered. Radiology: ordered.  Risk Prescription drug management.   This patient presents to the ED for concern of abd pain, this involves an extensive number of treatment options, and is a complaint that carries with it a high risk of complications and morbidity.  The differential diagnosis includes gastritis, cholelithiasis, cardiac, other infection   Co morbidities that complicate the patient evaluation  HIV, GERD, Depression, and gastritis   Additional history obtained:  Additional history obtained from epic chart review    Lab Tests:  I Ordered, and personally interpreted labs.  The pertinent results include:  cbc with wbc elevated at  13.3, hgb elevated at 18.3; cr elevated at 1.54 (all c/w dehydration); lfts nl; trop nl   Imaging Studies ordered:  I ordered imaging studies including cxr, gb US, ct abd/pelvis  I independently visualized and interpreted imaging which showed  CXR: IMPRESSION:  No acute abnormalities.  [place] abd: IMPRESSION:  No cholecystitis or cholelithiasis.    CBD is withi normal limits.    Likely hepatic steatosis. Left lobe of the liver is not well  delineated due to overlying bowel-gas.  CT abd/pelvis: IMPRESSION:  1. Long segment of small bowel submucosal edema involving the mid to  distal ileum. No evidence of high-grade obstruction. Findings  suggest regional enteritis versus inflammatory bowel disease or less  likely connective tissue disease. Ischemia not favored as no  atherosclerotic disease of the mesenteric arteries.  2. Terminal ileum appears normal.  Colon appears normal.   I agree with the radiologist interpretation   Cardiac Monitoring:  The patient was maintained on a cardiac monitor.  I personally viewed and interpreted the cardiac monitored which showed an underlying rhythm of: nsr   Medicines ordered and prescription drug management:  I ordered medication including zofran, fentanyl, and ativan  for pain  Reevaluation of the patient after these medicines showed that the patient improved I have reviewed the patients home medicines and have made adjustments as needed   Test Considered:  [place], ct   Critical Interventions:  Pain control   Problem List / ED Course:  Abdominal pain:  likely due to gastritis or an ulcer.  Pt has rx for protonix, carafate, and buspar.  CT abn does not correlate with pain.  I think that can be followed up as an outpatient.  Pt does have a good relationship with his GI doc.  He knows to f/u. Dehydration:  improved after ivfs    Reevaluation:  After the interventions noted above, I reevaluated the patient and found that they have  :improved   Social Determinants of Health:  Lives at home   Dispostion:  After consideration of the diagnostic results and the patients response to treatment, I feel that the patent would benefit from discharge with outpatient f/u.  Final Clinical Impression(s) / ED Diagnoses Final diagnoses:  Epigastric pain    Rx / DC Orders ED Discharge Orders          Ordered    HYDROcodone-acetaminophen (NORCO/VICODIN) 5-325 MG tablet  Every 4 hours PRN        05/30/22 1953    LORazepam (ATIVAN) 1 MG tablet  3 times daily PRN        05/30/22 1953    ondansetron (ZOFRAN-ODT) 4 MG disintegrating tablet  Every 8 hours PRN        05/30/22 1953              Isla Pence, MD 05/30/22 1958

## 2022-05-30 NOTE — ED Triage Notes (Signed)
Pt c/o epigastric pain with n/v, hs of same

## 2022-05-30 NOTE — Telephone Encounter (Signed)
Spoke to patient.  Currently in the ER.  Had a little bit of nausea last night and then after a tenderloin biscuit this morning developed severe epigastric pain.  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  So far right upper quadrant ultrasound shows hepatic steatosis suspicion.  CBD 1.8 mm.  LFTs look normal.  Hemoglobin is up creatinine is up consistent with dehydration.  He feels better after fentanyl, Zofran and Ativan.  He is going to have a CT of the abdomen and pelvis.  I will follow-up on that and regroup with him.

## 2022-05-31 ENCOUNTER — Other Ambulatory Visit (HOSPITAL_BASED_OUTPATIENT_CLINIC_OR_DEPARTMENT_OTHER): Payer: Self-pay

## 2022-05-31 NOTE — Telephone Encounter (Signed)
I called him back again.  His CT scan showed thickening of loops of small bowel.  He is feeling better just "wiped out".   He needs follow-up with me.  Please see if he can come August 3 at 1130 we can add him on

## 2022-05-31 NOTE — Telephone Encounter (Signed)
Pt notified of Dr. Carlean Purl recommendation for a follow up visit: Pt was scheduled for an office visit on 06/02/2022 at 11:30 AM: Pt made aware: Pt verbalized understanding with all questions answered.

## 2022-06-02 ENCOUNTER — Telehealth: Payer: Self-pay | Admitting: Internal Medicine

## 2022-06-02 ENCOUNTER — Ambulatory Visit (INDEPENDENT_AMBULATORY_CARE_PROVIDER_SITE_OTHER)
Admission: RE | Admit: 2022-06-02 | Discharge: 2022-06-02 | Disposition: A | Discharge status: Home / Self Care | Payer: 59 | Source: Ambulatory Visit | Attending: Internal Medicine | Admitting: Internal Medicine

## 2022-06-02 ENCOUNTER — Other Ambulatory Visit (HOSPITAL_BASED_OUTPATIENT_CLINIC_OR_DEPARTMENT_OTHER): Payer: Self-pay

## 2022-06-02 ENCOUNTER — Encounter: Payer: Self-pay | Admitting: Internal Medicine

## 2022-06-02 ENCOUNTER — Ambulatory Visit: Payer: 59 | Admitting: Internal Medicine

## 2022-06-02 ENCOUNTER — Other Ambulatory Visit (INDEPENDENT_AMBULATORY_CARE_PROVIDER_SITE_OTHER): Payer: 59

## 2022-06-02 VITALS — BP 120/68 | HR 75 | Ht 68.0 in | Wt 201.0 lb

## 2022-06-02 DIAGNOSIS — R1013 Epigastric pain: Secondary | ICD-10-CM

## 2022-06-02 DIAGNOSIS — T783XXA Angioneurotic edema, initial encounter: Secondary | ICD-10-CM | POA: Insufficient documentation

## 2022-06-02 DIAGNOSIS — R112 Nausea with vomiting, unspecified: Secondary | ICD-10-CM

## 2022-06-02 DIAGNOSIS — R933 Abnormal findings on diagnostic imaging of other parts of digestive tract: Secondary | ICD-10-CM

## 2022-06-02 HISTORY — DX: Angioneurotic edema, initial encounter: T78.3XXA

## 2022-06-02 LAB — CBC WITH DIFFERENTIAL/PLATELET
Basophils Absolute: 0 10*3/uL (ref 0.0–0.1)
Basophils Relative: 0.2 % (ref 0.0–3.0)
Eosinophils Absolute: 0 10*3/uL (ref 0.0–0.7)
Eosinophils Relative: 0.7 % (ref 0.0–5.0)
HCT: 50.4 % (ref 39.0–52.0)
Hemoglobin: 17.1 g/dL — ABNORMAL HIGH (ref 13.0–17.0)
Lymphocytes Relative: 27.3 % (ref 12.0–46.0)
Lymphs Abs: 1.7 10*3/uL (ref 0.7–4.0)
MCHC: 34 g/dL (ref 30.0–36.0)
MCV: 89.8 fl (ref 78.0–100.0)
Monocytes Absolute: 0.4 10*3/uL (ref 0.1–1.0)
Monocytes Relative: 7.2 % (ref 3.0–12.0)
Neutro Abs: 4 10*3/uL (ref 1.4–7.7)
Neutrophils Relative %: 64.6 % (ref 43.0–77.0)
Platelets: 179 10*3/uL (ref 150.0–400.0)
RBC: 5.62 Mil/uL (ref 4.22–5.81)
RDW: 13.6 % (ref 11.5–15.5)
WBC: 6.1 10*3/uL (ref 4.0–10.5)

## 2022-06-02 LAB — COMPREHENSIVE METABOLIC PANEL
ALT: 13 U/L (ref 0–53)
AST: 15 U/L (ref 0–37)
Albumin: 4.7 g/dL (ref 3.5–5.2)
Alkaline Phosphatase: 72 U/L (ref 39–117)
BUN: 10 mg/dL (ref 6–23)
CO2: 29 mEq/L (ref 19–32)
Calcium: 9.4 mg/dL (ref 8.4–10.5)
Chloride: 102 mEq/L (ref 96–112)
Creatinine, Ser: 1.35 mg/dL (ref 0.40–1.50)
GFR: 55.95 mL/min — ABNORMAL LOW (ref >60.00)
Glucose, Bld: 93 mg/dL (ref 70–99)
Potassium: 3.7 mEq/L (ref 3.5–5.1)
Sodium: 140 mEq/L (ref 135–145)
Total Bilirubin: 0.9 mg/dL (ref 0.2–1.2)
Total Protein: 7.8 g/dL (ref 6.0–8.3)

## 2022-06-02 LAB — SEDIMENTATION RATE: Sed Rate: 12 mm/hr (ref 0–20)

## 2022-06-02 LAB — C-REACTIVE PROTEIN: CRP: <1 mg/dL (ref 0.5–20.0)

## 2022-06-02 MED ORDER — PREDNISONE 10 MG PO TABS
40.0000 mg | ORAL_TABLET | Freq: Once | ORAL | 0 refills | Status: AC
  Filled 2022-06-02: qty 4, 1d supply, fill #0

## 2022-06-02 NOTE — Patient Instructions (Addendum)
Your provider has requested that you go to the basement level for lab work before leaving today. Press "B" on the elevator. The lab is located at the first door on the left as you exit the elevator.  Due to recent changes in healthcare laws, you may see the results of your imaging and laboratory studies on MyChart before your provider has had a chance to review them.  We understand that in some cases there may be results that are confusing or concerning to you. Not all laboratory results come back in the same time frame and the provider may be waiting for multiple results in order to interpret others.  Please give [place] 48 hours in order for your provider to thoroughly review all the results before contacting the office for clarification of your results.   Please go to the x-ray department in the basement before leaving today.  We have sent the following medications to your pharmacy for you to pick up at your convenience: Prednisone and Benadryl (purchase over the counter and take '50mg'$ 's along with your prednisone tablet.)  I appreciate the opportunity to care for you. Silvano Rusk, MD, [facility]

## 2022-06-02 NOTE — Progress Notes (Signed)
Bruce Woods 61 y.o. 1961/09/12 [id number]  Assessment & Plan:   Encounter Diagnoses  Name Primary?   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  He knows about that disorder and he has not had any signs or symptoms of oral or other angioedema.  He is not on an ACE inhibitor.  There is no family history.  After the visit and while doing documentation I did some more research and a Google search did show that amlodipine can cause angioedema of the gut or other body parts.  Question if the sensation he had described as a laryngospasm in May was related to something like this but it was not visible on exam.  I had decided to empirically treat him with prednisone and Benadryl, 40 mg and 50 mg x 1 each.  I repeated labs.   Lab Results  Component Value Date   WBC 6.1 06/02/2022   HGB 17.1 (H) 06/02/2022   HCT 50.4 06/02/2022   MCV 89.8 06/02/2022   PLT 179.0 06/02/2022   Lab Results  Component Value Date   CREATININE 1.35 06/02/2022   BUN 10 06/02/2022   NA 140 06/02/2022   K 3.7 06/02/2022   CL 102 06/02/2022   CO2 29 06/02/2022   Lab Results  Component Value Date   ALT 13 06/02/2022   AST 15 06/02/2022   ALKPHOS 72 06/02/2022   BILITOT 0.9 06/02/2022   C4 complement level is pending  He is to stay on a mostly liquid diet with fluids and protein drinks.  I have ordered a 2 view abdomen as well.  It is not read by radiology I do not see any major abnormalities.  There might be some slight edema of a loop of small bowel.  There is a nonspecific bowel gas pattern.  Fair amount of stool suspected.  I will await the final report but doubt that will tell us anything different.   Based upon what I found about amlodipine I think he should stop that and change to a different blood pressure medicine.  He should not take  an ACE inhibitor or even an ARB, I think given that I suspect angioedema of the bowel.  From what I read there might be some overlap mechanisms.    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  He was working in the ED and had a work-up.  Imaging as below.  He was treated with Ativan antiemetics and pain medication and tolerated things and went home but since then he has tolerated a muffin 2 days ago and then tried to eat part of a bagel yesterday and vomited that and is taking in some liquids.  He still has knot-like epigastric pain that is disabling and he has to lie on his side for relief.  Bowel movements are normal.  In May he had an episode of what was called laryngospasm treated by Dr. Bobby Rumpf in the ED.  There was no angioedema seen or suspected.  He had some oropharyngeal erythema but again no edema anywhere.  He does not have any history of  angioedema.  There is no family history of that either.  He does report he feels like he needs to belch all the time right now.  He is taking ondansetron and Ativan at home which helps some.  He is hoping to go back to work tomorrow.  CT ABDOMEN PELVIS W CONTRAST CLINICAL DATA:  Abdominal pain, acute nonlocalized  EXAM: CT ABDOMEN AND PELVIS WITH CONTRAST  TECHNIQUE: Multidetector CT imaging of the abdomen and pelvis was performed using the standard protocol following bolus administration of intravenous contrast.  RADIATION DOSE REDUCTION: This exam was performed according to the departmental dose-optimization program which includes automated exposure control, adjustment of the mA and/or kV according to patient size and/or use of iterative reconstruction technique.  CONTRAST:  155m OMNIPAQUE  IOHEXOL 300 MG/ML  SOLN  COMPARISON:  None Available.  FINDINGS: Lower chest: Lung bases are clear.  Hepatobiliary: No focal hepatic lesion. No biliary duct dilatation. Common bile duct is normal.  Pancreas: Pancreas is normal. No ductal dilatation. No pancreatic inflammation.  Spleen: Normal spleen  Adrenals/urinary tract: Adrenal glands and kidneys are normal. The ureters and bladder normal.  Stomach/Bowel: Stomach and duodenum are normal. Proximal small bowel is normal. Within the mid to distal ileum there is a long segment of submucosal edema involving the small bowel (image 65/2 for example). Small amount fluid within the mildly distended abnormal small bowel. There is no distension of the small bowel to suggest obstruction. No pneumatosis.  The colon and rectosigmoid colon are normal.  Vascular/Lymphatic: Abdominal aorta is normal caliber. No periportal or retroperitoneal adenopathy. No pelvic adenopathy.  Reproductive: Prostate normal  Other: No free fluid.  Musculoskeletal: No aggressive osseous lesion.  IMPRESSION: 1. Long segment of small bowel submucosal edema involving the mid to distal ileum. No evidence of high-grade obstruction. Findings suggest regional enteritis versus inflammatory bowel disease or less likely connective tissue disease. Ischemia not favored as no atherosclerotic disease of the mesenteric arteries. 2. Terminal ileum appears normal.  Colon appears normal.  Electronically Signed   By: SSuzy BouchardM.D.   On: 05/30/2022 18:25 [place]dom[place] Limited RUQ (LIVER/GB) CLINICAL DATA:  Right upper quadrant pain  EXAM: ULTRASOUND ABDOMEN LIMITED RIGHT UPPER QUADRANT  COMPARISON:  Correlation is made with a CT of the abdomen and pelvis dated September 21, 2021  FINDINGS: Gallbladder:  No gallstones or wall thickening visualized. Gallbladder wall measures 2.1 mm. No pericholecystic fluid. No sonographic Murphy sign noted by  sonographer.  Common bile duct:  Diameter: 1.8 mm  Liver:  Left lobe of the liver is not well identified due to overlying bowel gas. Remainder of the liver shows mild increased echogenicity of likely hepatic steatosis. No focal lesion identified. Portal vein is patent on color Doppler imaging with normal direction of blood flow towards the liver.  Other: None.  IMPRESSION: No cholecystitis or cholelithiasis.  CBD is withi normal limits.  Likely hepatic steatosis. Left lobe of the liver is not well delineated due to overlying bowel-gas.  Electronically Signed   By: AFrazier RichardsM.D.   On: 05/30/2022 17:02 DG Chest Port 1 View CLINICAL DATA:  Chest pain  EXAM: PORTABLE CHEST 1 VIEW  COMPARISON:  Portable exam 1540 hours compared to 03/15/2022  FINDINGS: Normal heart size, mediastinal contours, and pulmonary vascularity.  Lungs clear.  No infiltrate, pleural effusion, or pneumothorax.  Osseous structures unremarkable.  IMPRESSION: No acute abnormalities.  Electronically Signed   By: MLavonia DanaM.D.   On: 05/30/2022 16:10  EGD  10/11/2021 Grade a esophagitis, gastritis, Schatzki ring, small hiatal hernia.  1. Surgical [P], duodenum - DUODENAL MUCOSA WITH NO SPECIFIC HISTOPATHOLOGIC CHANGES - NEGATIVE FOR INCREASED INTRAEPITHELIAL LYMPHOCYTES OR VILLOUS ARCHITECTURAL CHANGES 2. Surgical [P], gastric anturm and gastric body - GASTRIC ANTRAL AND OXYNTIC MUCOSA WITH MILD CHRONIC GASTRITIS - WARTHIN STARRY STAIN IS NEGATIVE FOR HELICOBACTER PYLORI 3. Surgical [P], colon, GE junction - ESOPHAGEAL SQUAMOUS AND CARDIAC MUCOSA WITH REFLUX-ASSOCIATED CHANGES - NEGATIVE FOR INTESTINAL METAPLASIA OR DYSPLASIA  Allergies  Allergen Reactions   Morphine Other (See Comments)    Resp distress, rash, tachycardia   Doxycycline Nausea And Vomiting   Celebrex [Celecoxib] Other (See Comments)    Elevated LFT's   Wellbutrin [Bupropion] Hives   Current Meds  Medication  Sig   amLODipine (NORVASC) 5 MG tablet Take 1 tablet (5 mg total) by mouth daily.   anastrozole (ARIMIDEX) 1 MG tablet Take 1 tablet by mouth once weekly   bictegravir-emtricitabine-tenofovir AF (BIKTARVY) 50-200-25 MG TABS tablet TAKE 1 TABLET BY MOUTH DAILY.   busPIRone HCl (BUSPAR PO) Take by mouth in the morning and at bedtime.   citalopram (CELEXA) 20 MG tablet Take 1 tablet (20 mg total) by mouth daily.   HYDROcodone-acetaminophen (NORCO/VICODIN) 5-325 MG tablet Take 1 tablet by mouth every 4 (four) hours as needed.   Hyoscyamine Sulfate (LEVSIN PO) Take by mouth in the morning and at bedtime.   LORazepam (ATIVAN) 1 MG tablet Take 1 tablet (1 mg total) by mouth 3 (three) times daily as needed (spasms).   methocarbamol (ROBAXIN) 500 MG tablet 500 mg as needed.   ondansetron (ZOFRAN-ODT) 4 MG disintegrating tablet Take 1 tablet (4 mg total) by mouth every 8 (eight) hours as needed for nausea or vomiting.   pantoprazole (PROTONIX) 40 MG tablet Take 1 tablet (40 mg total) by mouth 2 (two) times daily. (Patient taking differently: Take 40 mg by mouth daily.)   Sucralfate (CARAFATE PO) Take by mouth in the morning and at bedtime.   tadalafil (CIALIS) 5 MG tablet Take 1 tablet by mouth once daily   testosterone cypionate (DEPOTESTOSTERONE CYPIONATE) 200 MG/ML injection Inject 0.4m into the muscle once weekly   Vitamin D, Ergocalciferol, (DRISDOL) 1.25 MG (50000 UNIT) CAPS capsule Take 1 capsule (50,000 Units total) by mouth every 7 (seven) days.   zolpidem (AMBIEN) 10 MG tablet TAKE 1 TABLET BY MOUTH EVERY NIGHT AT BEDTIME AS NEEDED FOR SLEEP   Past Medical History:  Diagnosis Date   COVID-19 07/2021   DEPRESSION    Gastritis and gastroduodenitis    GERD (gastroesophageal reflux disease)    HIV DISEASE    IBS (irritable bowel syndrome)    Sarcoidosis 2000   question of   Ulnar nerve compression, right    Past Surgical History:  Procedure Laterality Date   COLONOSCOPY  2010    ESOPHAGOGASTRODUODENOSCOPY  2002   LUNG SURGERY  10/31/1998   vats   TONSILLECTOMY AND ADENOIDECTOMY     ULNAR NERVE TRANSPOSITION Right 05/17/2018   Procedure: RIGHT ULNAR NERVE DECOMPRESSION/TRANSPOSITION;  Surgeon: KLeanora Cover MD;  [place]  Service: [place]opedics;  Laterality: Right;   Social History   Social History Narrative   Married to BFrostproof daughters LDanielle Dess  RN PACU, ED   Former smoker no drug use occasional alcohol   family history includes Cancer in his mother; Hyperlipidemia in his mother; Hypertension in his mother.   Review of Systems As per HPI  Objective:   Physical  Exam BP 120/68   Pulse 75   Ht '5\' 8"'$  (1.727 m)   Wt 201 lb (91.2 kg)   SpO2 98%   BMI 30.56 kg/m  Well-developed well-nourished white man no acute distress Abdomen is initially soft but he is quite tender in the epigastrium and is more comfortable on the left side.  I do not think there is a musculoskeletal component.  Bowel sounds are present. He appears adequately hydrated.

## 2022-06-03 ENCOUNTER — Other Ambulatory Visit (HOSPITAL_COMMUNITY): Payer: Self-pay

## 2022-06-03 ENCOUNTER — Other Ambulatory Visit (HOSPITAL_BASED_OUTPATIENT_CLINIC_OR_DEPARTMENT_OTHER): Payer: Self-pay

## 2022-06-03 LAB — C4 COMPLEMENT: C4 Complement: 38 mg/dL (ref 15–53)

## 2022-06-03 MED ORDER — CARVEDILOL 12.5 MG PO TABS
12.5000 mg | ORAL_TABLET | Freq: Two times a day (BID) | ORAL | 1 refills | Status: DC
  Filled 2022-06-03: qty 60, 30d supply, fill #0

## 2022-06-03 NOTE — Telephone Encounter (Signed)
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  It was stopped. Please call patient: Will replace CCB's with carvedilol 12.5 mg twice daily.  Prescription sent, no interactions noted. Patient to watch BP and heart rate. To let me know how BPs are going in 3 weeks.

## 2022-06-03 NOTE — Telephone Encounter (Signed)
LMOM informing of recommendations. Instructed to call if questions/concerns.

## 2022-06-09 DIAGNOSIS — F902 Attention-deficit hyperactivity disorder, combined type: Secondary | ICD-10-CM | POA: Diagnosis not present

## 2022-06-16 ENCOUNTER — Other Ambulatory Visit (HOSPITAL_BASED_OUTPATIENT_CLINIC_OR_DEPARTMENT_OTHER): Payer: Self-pay

## 2022-06-17 ENCOUNTER — Other Ambulatory Visit (HOSPITAL_BASED_OUTPATIENT_CLINIC_OR_DEPARTMENT_OTHER): Payer: Self-pay

## 2022-06-21 ENCOUNTER — Other Ambulatory Visit (HOSPITAL_BASED_OUTPATIENT_CLINIC_OR_DEPARTMENT_OTHER): Payer: Self-pay

## 2022-06-21 ENCOUNTER — Encounter (HOSPITAL_BASED_OUTPATIENT_CLINIC_OR_DEPARTMENT_OTHER): Payer: Self-pay | Admitting: Family Medicine

## 2022-06-21 ENCOUNTER — Ambulatory Visit (HOSPITAL_BASED_OUTPATIENT_CLINIC_OR_DEPARTMENT_OTHER): Payer: 59 | Admitting: Family Medicine

## 2022-06-21 DIAGNOSIS — E559 Vitamin D deficiency, unspecified: Secondary | ICD-10-CM | POA: Insufficient documentation

## 2022-06-21 DIAGNOSIS — F3342 Major depressive disorder, recurrent, in full remission: Secondary | ICD-10-CM | POA: Diagnosis not present

## 2022-06-21 DIAGNOSIS — Z125 Encounter for screening for malignant neoplasm of prostate: Secondary | ICD-10-CM

## 2022-06-21 DIAGNOSIS — I10 Essential (primary) hypertension: Secondary | ICD-10-CM | POA: Diagnosis not present

## 2022-06-21 DIAGNOSIS — Z0000 Encounter for general adult medical examination without abnormal findings: Secondary | ICD-10-CM

## 2022-06-21 MED ORDER — LOSARTAN POTASSIUM 25 MG PO TABS
25.0000 mg | ORAL_TABLET | Freq: Every day | ORAL | 1 refills | Status: DC
  Filled 2022-06-21: qty 30, 30d supply, fill #0
  Filled 2022-07-14 – 2022-07-15 (×2): qty 30, 30d supply, fill #1

## 2022-06-21 NOTE — Assessment & Plan Note (Signed)
Symptoms well controlled currently with use of Celexa, no change in therapy at this time He is considering resuming counseling/therapy.  Discussed options with patient, did discuss that we do have a clinical psychologist available onsite here.  If he would like referral to establish with Dr. Michail Sermon or another provider, advised that he let us know and we can place referral at any time

## 2022-06-21 NOTE — Assessment & Plan Note (Signed)
Amlodipine was discontinued due to concern for gastrointestinal angioedema.  Unfortunately, he has not been tolerating carvedilol well with symptomatic bradycardia.  At this time, we will discontinue carvedilol and transition patient to losartan.  Discussed potential risks and side effects.  We will start with low-dose of the medication and I advised the patient continue to monitor his blood pressure as well as heart rate at home to ensure appropriate control of blood pressure without notable low blood pressure or continued bradycardia I feel that he is less likely to have any rebound effects from coming off the beta-blocker as he has only been on this for a short period of time Recommend intermittent monitoring of blood pressure at home, DASH diet

## 2022-06-21 NOTE — Assessment & Plan Note (Signed)
Noted on prior labs, most recent reading was low We will plan to recheck levels with labs at time of physical

## 2022-06-21 NOTE — Patient Instructions (Signed)
  Medication Instructions:  Your physician recommends that you continue on your current medications as directed. Please refer to the Current Medication list given to you today. --If you need a refill on any your medications before your next appointment, please call your pharmacy first. If no refills are authorized on file call the office.-- Lab Work: Your physician has recommended that you have lab work today: No If you have labs (blood work) drawn today and your tests are completely normal, you will receive your results via [facility] a phone call from our staff.  Please ensure you check your voicemail in the event that you authorized detailed messages to be left on a delegated number. If you have any lab test that is abnormal or we need to change your treatment, we will call you to review the results.  Referrals/Procedures/Imaging: No  Follow-Up: Your next appointment:   Your physician recommends that you schedule a follow-up appointment in: 6 months with Dr. de [place]. Nurse visit 1 week before for labs.  You will receive a text message or e-mail with a link to a survey about your care and experience with [place] today! We would greatly appreciate your feedback!   Thanks for letting us be apart of your health journey!!  Primary Care and Sports Medicine   Dr. Arlina Robes [place]   We encourage you to activate your patient portal called "MyChart".  Sign up information is provided on this After Visit Summary.  MyChart is used to connect with patients for Virtual Visits (Telemedicine).  Patients are able to view lab/test results, encounter notes, upcoming appointments, etc.  Non-urgent messages can be sent to your provider as well. To learn more about what you can do with MyChart, please visit --  [email or web address].

## 2022-06-21 NOTE — Progress Notes (Signed)
New Patient Office Visit  Subjective    Patient ID: Bruce Woods, male    DOB: 1960/11/16  Age: 61 y.o. MRN: [id number]  CC:  Chief Complaint  Patient presents with   New Patient (Initial Visit)    Pt here to establish new care     HPI Bruce Woods presents to establish care Last PCP - Dr. Kathlene Woods  HTN: Was being managed on amlodipine, but GI thought this was causing GI angioedema for patient.  After this, he was transitioned to carvedilol by his prior PCP.  This occurred earlier this month.  Unfortunately, since starting beta-blocker he has had lower blood pressure as well as lower heart rate readings and has had side effects and symptoms due to this.  He has had some dizziness, lightheadedness, symptoms of orthostasis.  At 1 point he did check his vitals and had a pulse rate in the 40s.  He has not had any issues with chest pain or headaches recently.  No other medications utilized in the past for blood pressure  Vit D def: Has had intermittent low vitamin D in the past, most recent check several months ago was slightly low.  Depression: Was started on citalopram by ID physician around time of HIV diagnosis. Did therapy early on, none now.  Currently feels that symptoms are well controlled with use of Celexa at 20 mg dose, no concerns today.  Low testosterone: being managed by Bruce Woods, Bruce Woods.  Reports that he has generally been feeling much improved being on testosterone replacement therapy.  Patient was born at [facility], has been living here for about 30+ years. 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 Outside of work, he enjoys being on his Transport planner, playing racquetball, has chihuahuas at home, likes to read.  Outpatient Encounter Medications as of 06/21/2022  Medication Sig   losartan (COZAAR) 25 MG tablet Take 1 tablet (25 mg total) by mouth daily.   anastrozole (ARIMIDEX) 1 MG tablet Take 1 tablet by mouth  once weekly   bictegravir-emtricitabine-tenofovir AF (BIKTARVY) 50-200-25 MG TABS tablet TAKE 1 TABLET BY MOUTH DAILY.   citalopram (CELEXA) 20 MG tablet Take 1 tablet (20 mg total) by mouth daily.   methocarbamol (ROBAXIN) 500 MG tablet 500 mg as needed.   ondansetron (ZOFRAN-ODT) 4 MG disintegrating tablet Take 1 tablet (4 mg total) by mouth every 8 (eight) hours as needed for nausea or vomiting.   pantoprazole (PROTONIX) 40 MG tablet Take 1 tablet (40 mg total) by mouth 2 (two) times daily. (Patient taking differently: Take 40 mg by mouth daily.)   tadalafil (CIALIS) 5 MG tablet Take 1 tablet by mouth once daily   testosterone cypionate (DEPOTESTOSTERONE CYPIONATE) 200 MG/ML injection Inject 0.27m into the muscle once weekly   zolpidem (AMBIEN) 10 MG tablet TAKE 1 TABLET BY MOUTH EVERY NIGHT AT BEDTIME AS NEEDED FOR SLEEP   [DISCONTINUED] busPIRone HCl (BUSPAR PO) Take by mouth in the morning and at bedtime.   [DISCONTINUED] carvedilol (COREG) 12.5 MG tablet Take 1 tablet (12.5 mg total) by mouth 2 (two) times daily with a meal.   [DISCONTINUED] HYDROcodone-acetaminophen (NORCO/VICODIN) 5-325 MG tablet Take 1 tablet by mouth every 4 (four) hours as needed.   [DISCONTINUED] Hyoscyamine Sulfate (LEVSIN PO) Take by mouth in the morning and at bedtime.   [DISCONTINUED] LORazepam (ATIVAN) 1 MG tablet Take 1 tablet (1 mg total) by mouth 3 (three) times  daily as needed (spasms).   [DISCONTINUED] sildenafil (REVATIO) 20 MG tablet TAKE 3-4 TABLETS (60-80 MG TOTAL) BY MOUTH AT BEDTIME AS NEEDED. (Patient not taking: Reported on 06/02/2022)   [DISCONTINUED] Sucralfate (CARAFATE PO) Take by mouth in the morning and at bedtime.   [DISCONTINUED] Testosterone (NATESTO) 5.5 MG/ACT GEL Instill 1 spray into each nostril three times daily as directed   [DISCONTINUED] Vitamin D, Ergocalciferol, (DRISDOL) 1.25 MG (50000 UNIT) CAPS capsule Take 1 capsule (50,000 Units total) by mouth every 7 (seven) days.   No  facility-administered encounter medications on file as of 06/21/2022.    Past Medical History:  Diagnosis Date   COVID-19 07/2021   DEPRESSION    Gastritis and gastroduodenitis    GERD (gastroesophageal reflux disease)    HIV DISEASE    IBS (irritable bowel syndrome)    Sarcoidosis 2000   question of   Ulnar nerve compression, right     Past Surgical History:  Procedure Laterality Date   COLONOSCOPY  2010   ESOPHAGOGASTRODUODENOSCOPY  2002   LUNG SURGERY  10/31/1998   vats   TONSILLECTOMY AND ADENOIDECTOMY     ULNAR NERVE TRANSPOSITION Right 05/17/2018   Procedure: RIGHT ULNAR NERVE DECOMPRESSION/TRANSPOSITION;  Surgeon: Leanora Cover, MD;  Location: [facility];  Service: Orthopedics;  Laterality: Right;    Family History  Problem Relation Age of Onset   Hyperlipidemia Mother    Hypertension Mother    Cancer Mother        glioblastoma   Sudden death Neg Hx    Heart attack Neg Hx    Diabetes Neg Hx    Colon cancer Neg Hx    Prostate cancer Neg Hx     Social History   Socioeconomic History   Marital status: Married    Spouse name: Not on file   Number of children: 2   Years of education: Not on file   Highest education level: Not on file  Occupational History   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: [employer]  Tobacco Use   Smoking status: Former    Packs/day: 1.00    Years: 2.00    Total pack years: 2.00    Types: Cigarettes    Quit date: 11/01/1983    Years since quitting: 38.6   Smokeless tobacco: Never  Vaping Use   Vaping Use: Never used  Substance and Sexual Activity   Alcohol use: Yes    Alcohol/week: 1.0 standard drink of alcohol    Types: 1 Standard drinks or equivalent per week    Comment: socially    Drug use: No   Sexual activity: Yes    Comment: declined condoms  Other Topics Concern   Not on file  Social History Narrative   Married to Bruce Woods, daughters Bruce Dess   RN PACU, ED   Former smoker no  drug use occasional alcohol   Social Determinants of Radio broadcast assistant Strain: Not on file  Food Insecurity: Not on file  Transportation Needs: Not on file  Physical Activity: Not on file  Stress: Not on file  Social Connections: Not on file  Intimate Partner Violence: Not on file    Objective    BP 108/72   Pulse (!) 50   Ht '5\' 8"'$  (1.727 m)   Wt 196 lb 4.8 oz (89 kg)   SpO2 98%   BMI 29.85 kg/m   Physical Exam  61 year old male in no acute  distress Cardiovascular exam with regular rate and rhythm, no murmur appreciated Lungs clear to auscultation bilaterally.  Assessment & Plan:   Problem List Items Addressed This Visit       Cardiovascular and Mediastinum   Hypertension, essential    Amlodipine was discontinued due to concern for gastrointestinal angioedema.  Unfortunately, he has not been tolerating carvedilol well with symptomatic bradycardia.  At this time, we will discontinue carvedilol and transition patient to losartan.  Discussed potential risks and side effects.  We will start with low-dose of the medication and I advised the patient continue to monitor his blood pressure as well as heart rate at home to ensure appropriate control of blood pressure without notable low blood pressure or continued bradycardia I feel that he is less likely to have any rebound effects from coming off the beta-blocker as he has only been on this for a short period of time Recommend intermittent monitoring of blood pressure at home, DASH diet      Relevant Medications   losartan (COZAAR) 25 MG tablet     Other   Depression    Symptoms well controlled currently with use of Celexa, no change in therapy at this time He is considering resuming counseling/therapy.  Discussed options with patient, did discuss that we do have a clinical psychologist available onsite here.  If he would like referral to establish with Dr. Michail Sermon or another provider, advised that he let us know  and we can place referral at any time      Vitamin D deficiency    Noted on prior labs, most recent reading was low We will plan to recheck levels with labs at time of physical      Relevant Orders   VITAMIN D 25 Hydroxy (Vit-D Deficiency, Fractures)   Other Visit Diagnoses     Wellness examination       Relevant Orders   CBC with Differential/Platelet   Comprehensive metabolic panel   Hemoglobin A1c   Lipid panel   TSH Rfx on Abnormal to Free T4   PSA Total (Reflex To Free)   VITAMIN D 25 Hydroxy (Vit-D Deficiency, Fractures)   Prostate cancer screening       Relevant Orders   PSA Total (Reflex To Free)       Return in about 6 months (around 12/22/2022) for CPE with FBW a few days a prior.   Annais Crafts J De [place], MD

## 2022-06-22 ENCOUNTER — Other Ambulatory Visit (HOSPITAL_BASED_OUTPATIENT_CLINIC_OR_DEPARTMENT_OTHER): Payer: Self-pay

## 2022-06-28 ENCOUNTER — Other Ambulatory Visit (HOSPITAL_COMMUNITY): Payer: Self-pay

## 2022-07-05 ENCOUNTER — Encounter: Payer: Self-pay | Admitting: Internal Medicine

## 2022-07-05 ENCOUNTER — Encounter (HOSPITAL_BASED_OUTPATIENT_CLINIC_OR_DEPARTMENT_OTHER): Payer: Self-pay | Admitting: Family Medicine

## 2022-07-05 DIAGNOSIS — K449 Diaphragmatic hernia without obstruction or gangrene: Secondary | ICD-10-CM

## 2022-07-06 ENCOUNTER — Other Ambulatory Visit (HOSPITAL_COMMUNITY): Payer: Self-pay

## 2022-07-07 ENCOUNTER — Other Ambulatory Visit (HOSPITAL_BASED_OUTPATIENT_CLINIC_OR_DEPARTMENT_OTHER): Payer: Self-pay

## 2022-07-07 DIAGNOSIS — N529 Male erectile dysfunction, unspecified: Secondary | ICD-10-CM | POA: Diagnosis not present

## 2022-07-07 DIAGNOSIS — E291 Testicular hypofunction: Secondary | ICD-10-CM | POA: Diagnosis not present

## 2022-07-07 MED ORDER — ANASTROZOLE 1 MG PO TABS
1.0000 mg | ORAL_TABLET | ORAL | 1 refills | Status: DC
  Filled 2022-07-07: qty 12, 84d supply, fill #0
  Filled 2022-09-19 – 2022-09-21 (×2): qty 12, 84d supply, fill #1
  Filled 2022-12-18: qty 12, 84d supply, fill #2
  Filled 2023-03-05: qty 4, 28d supply, fill #3

## 2022-07-07 MED ORDER — TESTOSTERONE CYPIONATE 200 MG/ML IM SOLN
100.0000 mg | INTRAMUSCULAR | 0 refills | Status: DC
  Filled 2022-07-07: qty 2, 28d supply, fill #0
  Filled 2022-08-12: qty 2, 28d supply, fill #1
  Filled 2022-09-07: qty 2, 28d supply, fill #2
  Filled 2022-10-10: qty 2, 28d supply, fill #3
  Filled 2022-11-19: qty 2, 28d supply, fill #4

## 2022-07-10 ENCOUNTER — Encounter (HOSPITAL_BASED_OUTPATIENT_CLINIC_OR_DEPARTMENT_OTHER): Payer: Self-pay | Admitting: Family Medicine

## 2022-07-14 ENCOUNTER — Other Ambulatory Visit (HOSPITAL_BASED_OUTPATIENT_CLINIC_OR_DEPARTMENT_OTHER): Payer: Self-pay

## 2022-07-15 ENCOUNTER — Other Ambulatory Visit (HOSPITAL_BASED_OUTPATIENT_CLINIC_OR_DEPARTMENT_OTHER): Payer: Self-pay

## 2022-07-16 ENCOUNTER — Other Ambulatory Visit: Payer: Self-pay | Admitting: Internal Medicine

## 2022-07-16 DIAGNOSIS — K2970 Gastritis, unspecified, without bleeding: Secondary | ICD-10-CM

## 2022-07-18 ENCOUNTER — Other Ambulatory Visit (HOSPITAL_BASED_OUTPATIENT_CLINIC_OR_DEPARTMENT_OTHER): Payer: Self-pay

## 2022-07-18 MED ORDER — PANTOPRAZOLE SODIUM 40 MG PO TBEC
40.0000 mg | DELAYED_RELEASE_TABLET | Freq: Two times a day (BID) | ORAL | 1 refills | Status: DC
  Filled 2022-07-18: qty 60, 30d supply, fill #0

## 2022-07-19 ENCOUNTER — Encounter: Payer: Self-pay | Admitting: Internal Medicine

## 2022-07-19 ENCOUNTER — Other Ambulatory Visit (HOSPITAL_BASED_OUTPATIENT_CLINIC_OR_DEPARTMENT_OTHER): Payer: Self-pay

## 2022-07-19 DIAGNOSIS — R110 Nausea: Secondary | ICD-10-CM | POA: Diagnosis not present

## 2022-07-19 DIAGNOSIS — K582 Mixed irritable bowel syndrome: Secondary | ICD-10-CM | POA: Diagnosis not present

## 2022-07-19 DIAGNOSIS — R6881 Early satiety: Secondary | ICD-10-CM | POA: Diagnosis not present

## 2022-07-19 DIAGNOSIS — R1013 Epigastric pain: Secondary | ICD-10-CM | POA: Diagnosis not present

## 2022-07-19 DIAGNOSIS — K2100 Gastro-esophageal reflux disease with esophagitis, without bleeding: Secondary | ICD-10-CM | POA: Diagnosis not present

## 2022-07-19 MED ORDER — FLUARIX QUADRIVALENT 0.5 ML IM SUSY
PREFILLED_SYRINGE | INTRAMUSCULAR | 0 refills | Status: DC
Start: 2022-07-19 — End: 2023-01-03
  Filled 2022-07-19: qty 0.5, 1d supply, fill #0

## 2022-07-21 ENCOUNTER — Telehealth: Payer: Self-pay

## 2022-07-21 ENCOUNTER — Other Ambulatory Visit (HOSPITAL_BASED_OUTPATIENT_CLINIC_OR_DEPARTMENT_OTHER): Payer: Self-pay

## 2022-07-21 ENCOUNTER — Other Ambulatory Visit: Payer: Self-pay

## 2022-07-21 DIAGNOSIS — K219 Gastro-esophageal reflux disease without esophagitis: Secondary | ICD-10-CM

## 2022-07-21 DIAGNOSIS — R12 Heartburn: Secondary | ICD-10-CM

## 2022-07-21 DIAGNOSIS — R1013 Epigastric pain: Secondary | ICD-10-CM

## 2022-07-21 NOTE — Telephone Encounter (Signed)
Pt was made aware of Dr. Carlean Purl recommendations:  Pt was scheduled for an EGD with Dr. Bryan Lemma for next week on 07/28/2022 at 8:30 AM  in the [facility]: Pt made aware: Prep instructions were sent to pt via my chart: Pt made aware Ambulatory referral to GI placed in Epic Pt verbalized understanding with all questions answered.

## 2022-07-21 NOTE — Telephone Encounter (Signed)
-----   Message from Hollister, DO sent at 07/20/2022  9:34 PM EDT ----- Regarding: RE: EGD please No problem. Will plan for expedited EGD (no Bravo).   ----- Message ----- From: Gatha Mayer, MD Sent: 07/20/2022   9:26 PM EDT To: Gillermina Hu, RN; Lavena Bullion, DO Subject: RE: EGD please                                 I did not discuss Bravo - mainly double checking that nothing strange has developed since last EGD.  ----- Message ----- From: Lavena Bullion, DO Sent: 07/20/2022   3:27 PM EDT To: Gatha Mayer, MD; Gillermina Hu, RN Subject: RE: EGD please                                 No problem. Happy to help out and get him back in for EGD. I agree that it doesn't sound like uncontrolled GERD is the cause of his sxs, otherwise I could place Bravo on therapy to look for breakthrough. Then again, maybe placing the Bravo would reassure him that there isnt breakthrough reflux?   Remo Lipps, ok to book for EGD with me. Should be able to get this done quickly.   Vito  ----- Message ----- From: Gatha Mayer, MD Sent: 07/20/2022   1:17 PM EDT To: Gillermina Hu, RN; Lavena Bullion, DO Subject: EGD please                                     Luanna Salk,  You did EGD on him in December - mild GERD/gastritis  Lots of persistent sxs and I communicated w/ him and we think repeating an EGD would make sense.  Would you do it while I am away?  He is pretty miserable and not clear why. Taking TID PPI and also carafate w/ GERD-like sxs, epigastric pain + burping. I am thinking this is functional?  I had thought he might have had gut angioedema at one point?? Can see recent notes.  He has made an appointment to see a surgeon re: hiatal hernia and I warned him to be very cautions about surgical repair fr theses sxs.   If you agree please let Lindie Roberson know and he can set it up.  Dx would be heartburn, epigastric pain, GERD  THANKS  Glendell Docker

## 2022-07-28 ENCOUNTER — Other Ambulatory Visit: Payer: Self-pay

## 2022-07-28 ENCOUNTER — Other Ambulatory Visit (HOSPITAL_COMMUNITY): Payer: Self-pay

## 2022-07-28 ENCOUNTER — Other Ambulatory Visit (HOSPITAL_BASED_OUTPATIENT_CLINIC_OR_DEPARTMENT_OTHER): Payer: Self-pay

## 2022-07-28 ENCOUNTER — Ambulatory Visit (AMBULATORY_SURGERY_CENTER): Payer: 59 | Admitting: Gastroenterology

## 2022-07-28 ENCOUNTER — Telehealth: Payer: Self-pay

## 2022-07-28 ENCOUNTER — Encounter: Payer: Self-pay | Admitting: Gastroenterology

## 2022-07-28 ENCOUNTER — Other Ambulatory Visit: Payer: Self-pay | Admitting: Gastroenterology

## 2022-07-28 VITALS — BP 129/77 | HR 65 | Temp 99.3°F | Resp 14 | Ht 68.0 in | Wt 201.0 lb

## 2022-07-28 DIAGNOSIS — K219 Gastro-esophageal reflux disease without esophagitis: Secondary | ICD-10-CM | POA: Diagnosis not present

## 2022-07-28 DIAGNOSIS — T8859XA Other complications of anesthesia, initial encounter: Secondary | ICD-10-CM

## 2022-07-28 DIAGNOSIS — K222 Esophageal obstruction: Secondary | ICD-10-CM | POA: Diagnosis not present

## 2022-07-28 DIAGNOSIS — K449 Diaphragmatic hernia without obstruction or gangrene: Secondary | ICD-10-CM | POA: Diagnosis not present

## 2022-07-28 DIAGNOSIS — R112 Nausea with vomiting, unspecified: Secondary | ICD-10-CM | POA: Diagnosis not present

## 2022-07-28 DIAGNOSIS — K2100 Gastro-esophageal reflux disease with esophagitis, without bleeding: Secondary | ICD-10-CM

## 2022-07-28 DIAGNOSIS — K5710 Diverticulosis of small intestine without perforation or abscess without bleeding: Secondary | ICD-10-CM | POA: Diagnosis not present

## 2022-07-28 DIAGNOSIS — R12 Heartburn: Secondary | ICD-10-CM

## 2022-07-28 DIAGNOSIS — R1013 Epigastric pain: Secondary | ICD-10-CM

## 2022-07-28 DIAGNOSIS — K2970 Gastritis, unspecified, without bleeding: Secondary | ICD-10-CM

## 2022-07-28 HISTORY — DX: Other complications of anesthesia, initial encounter: T88.59XA

## 2022-07-28 MED ORDER — DEXLANSOPRAZOLE 60 MG PO CPDR
60.0000 mg | DELAYED_RELEASE_CAPSULE | Freq: Every day | ORAL | 3 refills | Status: DC
  Filled 2022-07-28: qty 90, 90d supply, fill #0

## 2022-07-28 MED ORDER — RABEPRAZOLE SODIUM 20 MG PO TBEC
20.0000 mg | DELAYED_RELEASE_TABLET | Freq: Two times a day (BID) | ORAL | 5 refills | Status: DC
  Filled 2022-07-28: qty 60, 30d supply, fill #0

## 2022-07-28 MED ORDER — SODIUM CHLORIDE 0.9 % IV SOLN: 4.0000 mg | Freq: Once | INTRAVENOUS | Status: DC

## 2022-07-28 MED ORDER — SODIUM CHLORIDE 0.9 % IV SOLN: 500.0000 mL | INTRAVENOUS | Status: DC

## 2022-07-28 NOTE — Patient Instructions (Signed)
Change Protonix to Dexilant (dexlansoprazole) 60 mg PO daily. Can stop Protonix when able to start the Ign[place]. If Dexilant not approved by insurance, plan for Aciphex instead. - Follow-up with Dr. Carlean Purl in the GI clinic at appointment to be scheduled.   YOU HAD AN ENDOSCOPIC PROCEDURE TODAY: Refer to the procedure report and other information in the discharge instructions given to you for any specific questions about what was found during the examination. If this information does not answer your questions, please call [facility] office at [phone] to clarify.   YOU SHOULD EXPECT: Some feelings of bloating in the abdomen. Passage of more gas than usual. Walking can help get rid of the air that was put into your GI tract during the procedure and reduce the bloating. If you had a lower endoscopy (such as a colonoscopy or flexible sigmoidoscopy) you may notice spotting of blood in your stool or on the toilet paper. Some abdominal soreness may be present for a day or two, also.  DIET: Your first meal following the procedure should be a light meal and then it is ok to progress to your normal diet. A half-sandwich or bowl of soup is an example of a good first meal. Heavy or fried foods are harder to digest and may make you feel nauseous or bloated. Drink plenty of fluids but you should avoid alcoholic beverages for 24 hours. If you had a esophageal dilation, please see attached instructions for diet.    ACTIVITY: Your care partner should take you home directly after the procedure. You should plan to take it easy, moving slowly for the rest of the day. You can resume normal activity the day after the procedure however YOU SHOULD NOT DRIVE, use power tools, machinery or perform tasks that involve climbing or major physical exertion for 24 hours (because of the sedation medicines used during the test).   SYMPTOMS TO REPORT IMMEDIATELY: A gastroenterologist can be reached at any hour. Please call  [phone]  for any of the following symptoms:  Following upper endoscopy (EGD, EUS, ERCP, esophageal dilation) Vomiting of blood or coffee ground material  New, significant abdominal pain  New, significant chest pain or pain under the shoulder blades  Painful or persistently difficult swallowing  New shortness of breath  Black, tarry-looking or red, bloody stools  FOLLOW UP:  If any biopsies were taken you will be contacted by phone or by letter within the next 1-3 weeks. Call [phone]  if you have not heard about the biopsies in 3 weeks.  Please also call with any specific questions about appointments or follow up tests.

## 2022-07-28 NOTE — Progress Notes (Signed)
Pt had decreased sats to 60's during case  asked Dr Bryan Lemma to finish procedure to try and improve airway, increased 02. Pt had brochospasm and worsening sat after scope removed. Trouble removing bite block due to pt clenching. Removed with assitance and respriations assited with oral airway, jaw thrust and ambu bag. Pt sats immediately recovered with improved airway management. Pt placed on face mask and then [place]. Recovery started in procedure room and pt transferred to recovery after pt fully awake and maintaining sats in 90's on room air. Pt  made aware of spasm and jaw thrust and that he may have a sore jaw tomorrow. Report to RN, pt stable.

## 2022-07-28 NOTE — Telephone Encounter (Signed)
Will you please initiate PA for Aciphex 20 mg PO BID. Per Dr. Bryan Lemma he has failed high dose Protonix, so Prilosec definitely  wont be his best option. Thank you.

## 2022-07-28 NOTE — Progress Notes (Signed)
GASTROENTEROLOGY PROCEDURE H&P NOTE   Primary Care Physician: de [place]am, Raymond J, MD    Reason for Procedure:  Epigastric pain, nausea/vomiting, hiatal hernia  Plan:    EGD  Patient is appropriate for endoscopic procedure(s) in the ambulatory ([facility]) setting.  The nature of the procedure, as well as the risks, benefits, and alternatives were carefully and thoroughly reviewed with the patient. Ample time for discussion and questions allowed. The patient understood, was satisfied, and agreed to proceed.     HPI: Bruce Woods is a 61 y.o. male who presents for EGD for evaluation of epigastric pain, nausea/vomiting, hiatal hernia.  EGD in 09/2021 (on high-dose PPI and sucralfate) with LA Grade A esophagitis, nonobstructing Schatzki's ring (fractured with forceps), 3 cm HH, Hill grade 3 valve, mild non-H. 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  Normal duodenum with benign biopsies.  Increased/changed PPI to Protonix 40 mg twice daily.   Past Medical History:  Diagnosis Date   COVID-19 07/2021   DEPRESSION    Gastritis and gastroduodenitis    GERD (gastroesophageal reflux disease)    HIV DISEASE    IBS (irritable bowel syndrome)    Sarcoidosis 2000   question of   Ulnar nerve compression, right     Past Surgical History:  Procedure Laterality Date   COLONOSCOPY  2010   ESOPHAGOGASTRODUODENOSCOPY  2002   LUNG SURGERY  10/31/1998   vats   TONSILLECTOMY AND ADENOIDECTOMY     ULNAR NERVE TRANSPOSITION Right 05/17/2018   Procedure: RIGHT ULNAR NERVE DECOMPRESSION/TRANSPOSITION;  Surgeon: Leanora Cover, MD;  Location: [facility];  Service: Orthopedics;  Laterality: Right;    Prior to Admission medications   Medication Sig Start Date End Date Taking? Authorizing Provider  anastrozole (ARIMIDEX) 1 MG tablet Take 1 tablet by mouth once weekly 04/28/22  Yes   anastrozole (ARIMIDEX) 1 MG tablet Take 1 tablet (1 mg total) by mouth once a week. 07/07/22   Yes   bictegravir-emtricitabine-tenofovir AF (BIKTARVY) 50-200-25 MG TABS tablet TAKE 1 TABLET BY MOUTH DAILY. 05/27/22 05/27/23 Yes Michel Bickers, MD  citalopram (CELEXA) 20 MG tablet Take 1 tablet (20 mg total) by mouth daily. 02/09/22  Yes Paz, Alda Berthold, MD  losartan (COZAAR) 25 MG tablet Take 1 tablet (25 mg total) by mouth daily. 06/21/22  Yes de [place], Raymond J, MD  methocarbamol (ROBAXIN) 500 MG tablet 500 mg as needed.   Yes [provider]  ondansetron (ZOFRAN-ODT) 4 MG disintegrating tablet Take 1 tablet (4 mg total) by mouth every 8 (eight) hours as needed for nausea or vomiting. 05/30/22  Yes Isla Pence, MD  pantoprazole (PROTONIX) 40 MG tablet Take 1 tablet (40 mg total) by mouth 2 (two) times daily. 07/18/22  Yes Gatha Mayer, MD  tadalafil (CIALIS) 5 MG tablet Take 1 tablet by mouth once daily 03/04/22  Yes   testosterone cypionate (DEPOTESTOSTERONE CYPIONATE) 200 MG/ML injection Inject 0.5 mLs (100 mg total) into the muscle once a week. 07/07/22  Yes   influenza vac split quadrivalent PF (FLUARIX QUADRIVALENT) 0.5 ML injection Inject into the muscle. 07/19/22   Carlyle Basques, MD  zolpidem (AMBIEN) 10 MG tablet TAKE 1 TABLET BY MOUTH EVERY NIGHT AT BEDTIME AS NEEDED FOR SLEEP 10/26/21 06/02/22  Colon Branch, MD    Current Outpatient Medications  Medication Sig Dispense Refill   anastrozole (ARIMIDEX) 1 MG tablet Take 1 tablet by mouth once weekly 20 tablet 1   anastrozole (ARIMIDEX) 1 MG tablet Take  1 tablet (1 mg total) by mouth once a week. 20 tablet 1   bictegravir-emtricitabine-tenofovir AF (BIKTARVY) 50-200-25 MG TABS tablet TAKE 1 TABLET BY MOUTH DAILY. 30 tablet 5   citalopram (CELEXA) 20 MG tablet Take 1 tablet (20 mg total) by mouth daily. 90 tablet 3   losartan (COZAAR) 25 MG tablet Take 1 tablet (25 mg total) by mouth daily. 30 tablet 1   methocarbamol (ROBAXIN) 500 MG tablet 500 mg as needed.     ondansetron (ZOFRAN-ODT) 4 MG disintegrating tablet Take 1 tablet (4  mg total) by mouth every 8 (eight) hours as needed for nausea or vomiting. 20 tablet 0   pantoprazole (PROTONIX) 40 MG tablet Take 1 tablet (40 mg total) by mouth 2 (two) times daily. 60 tablet 1   tadalafil (CIALIS) 5 MG tablet Take 1 tablet by mouth once daily 90 tablet 3   testosterone cypionate (DEPOTESTOSTERONE CYPIONATE) 200 MG/ML injection Inject 0.5 mLs (100 mg total) into the muscle once a week. 10 mL 0   influenza vac split quadrivalent PF (FLUARIX QUADRIVALENT) 0.5 ML injection Inject into the muscle. 0.5 mL 0   zolpidem (AMBIEN) 10 MG tablet TAKE 1 TABLET BY MOUTH EVERY NIGHT AT BEDTIME AS NEEDED FOR SLEEP 30 tablet 1   Current Facility-Administered Medications  Medication Dose Route Frequency Provider Last Rate Last Admin   0.9 %  sodium chloride infusion  500 mL Intravenous Continuous Wen Munford V, DO        Allergies as of 07/28/2022 - Review Complete 07/28/2022  Allergen Reaction Noted   Amlodipine besylate Swelling 06/02/2022   Morphine Other (See Comments) 08/18/2006   Doxycycline Nausea And Vomiting 08/18/2006   Celebrex [celecoxib] Other (See Comments) 04/19/2011   Wellbutrin [bupropion] Hives 11/12/2013    Family History  Problem Relation Age of Onset   Hyperlipidemia Mother    Hypertension Mother    Cancer Mother        glioblastoma   Sudden death Neg Hx    Heart attack Neg Hx    Diabetes Neg Hx    Colon cancer Neg Hx    Prostate cancer Neg Hx     Social History   Socioeconomic History   Marital status: Married    Spouse name: Not on file   Number of children: 2   Years of education: Not on file   Highest education level: Not on file  Occupational History   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: [employer]  Tobacco Use   Smoking status: Former    Packs/day: 1.00    Years: 2.00    Total pack years: 2.00    Types: Cigarettes    Quit date: 11/01/1983    Years since quitting: 38.7   Smokeless tobacco: Never  Vaping Use    Vaping Use: Never used  Substance and Sexual Activity   Alcohol use: Yes    Alcohol/week: 1.0 standard drink of alcohol    Types: 1 Standard drinks or equivalent per week    Comment: socially    Drug use: No   Sexual activity: Yes    Comment: declined condoms  Other Topics Concern   Not on file  Social History Narrative   Married to [place], daughters [name]   RN PACU, ED   Former smoker no drug use occasional alcohol   Social Determinants of Radio broadcast assistant Strain: Not on file  Food Insecurity: Not on Pensions consultant  Needs: Not on file  Physical Activity: Not on file  Stress: Not on file  Social Connections: Not on file  Intimate Partner Violence: Not on file    Physical Exam: Vital signs in last 24 hours: '@BP'$  133/81   Pulse 65   Temp 99.3 F (37.4 C)   Ht '5\' 8"'$  (1.727 m)   Wt 201 lb (91.2 kg)   SpO2 98%   BMI 30.56 kg/m  GEN: NAD EYE: Sclerae anicteric ENT: MMM CV: Non-tachycardic Pulm: CTA b/l GI: Soft, NT/ND NEURO:  Alert & Oriented x 3   Gerrit Heck, DO [facility]   07/28/2022 7:48 AM

## 2022-07-28 NOTE — Telephone Encounter (Signed)
See if we can get PA for Aciphex BID. He has failed high dose Protonix, so Prilosec def wont be his best option. If not able to get high dose Aciphex, then Nexium 40 mg BID will be our only option. Thanks!

## 2022-07-28 NOTE — Telephone Encounter (Signed)
-----   Message from Lavena Bullion, DO sent at 07/28/2022  9:13 AM EDT ----- Mickel Baas,  Can you please send a referral for this patient to Dr. Rosendo Gros at [facility] for hiatal hernia repair/fundoplication (not a combined TIF. This will be a surgical repair only).  CG, EGD today with Grade C erosive esophagitis and 4 cm hiatal hernia with moderate antral gastritis. I will try to get him on Dexilant. Had a pretty nastry laryngospasm at the end of the case, but recovered quickly.   Thanks.

## 2022-07-28 NOTE — Op Note (Signed)
[facility] Patient Name: Bruce Woods Procedure Date: 07/28/2022 8:18 AM MRN: [id number] Endoscopist: Gerrit Heck , MD Age: 61 Referring MD:  Date of Birth: 1961/02/01 Gender: Male Account #: [id number] Procedure:                Upper GI endoscopy Indications:              Epigastric abdominal pain, , Heartburn, Follow-up                            of reflux esophagitis, Preoperative assessment,                            Hiatal hernia, Nausea with vomiting, Regurgitation                           61 yo male with continued reflux sxs, nausea,                            regurgitation despite high dose PPI and carafate.                           EGD in 09/2021 (on high-dose PPI and sucralfate)                            with LA Grade A esophagitis, nonobstructing                            Schatzki's ring (fractured with forceps), 3 cm HH,                            Hill grade 3 valve, mild non-H. 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 Normal duodenum                            with benign biopsies. Increased/changed PPI to                            Protonix 40 mg twice daily. Medicines:                Monitored Anesthesia Care, Robinol, Esmolol Procedure:                Pre-Anesthesia Assessment:                           - Prior to the procedure, a History and Physical                            was performed, and patient medications and                            allergies were reviewed. The patient's tolerance of  previous anesthesia was also reviewed. The risks                            and benefits of the procedure and the sedation                            options and risks were discussed with the patient.                            All questions were answered, and informed consent                            was obtained. Prior Anticoagulants: The patient has                            taken no  previous anticoagulant or antiplatelet                            agents. ASA Grade Assessment: II - A patient with                            mild systemic disease. After reviewing the risks                            and benefits, the patient was deemed in                            satisfactory condition to undergo the procedure.                           After obtaining informed consent, the endoscope was                            passed under direct vision. Throughout the                            procedure, the patient's blood pressure, pulse, and                            oxygen saturations were monitored continuously. The                            GIF [id number] #[id number] was introduced through the                            mouth, and advanced to the fourth part of duodenum.                            The upper GI endoscopy was accomplished without                            difficulty. Near the end of the study, the patient  developed severe laryngospasm, requiring oral                            airway and [place] with 15L supplemental O2, with                            quick restoration of pulse ox into the mid to high                            90's. No emesis or aspiration. Blood pressure was                            elevated at the conclusion of the procedure, and                            that responded well to esmolol. Scope In: Scope Out: Findings:                 LA Grade C (one or more mucosal breaks continuous                            between tops of 2 or more mucosal folds, less than                            75% circumference) esophagitis with no bleeding was                            found in the lower third of the esophagus.                           A 4 cm hiatal hernia was present.                           The gastroesophageal flap valve was visualized                            endoscopically and classified as Hill Grade III                             (minimal fold, loose to endoscope, hiatal hernia                            likely).                           Localized moderate inflammation characterized by                            congestion (edema) and erythema was found in the                            gastric antrum. Biopsies were taken with a cold  forceps for histology. Estimated blood loss was                            minimal.                           The gastric fundus, gastric body and incisura were                            normal.                           A medium non-bleeding diverticulum was found in the                            second portion of the duodenum.                           Normal mucosa was found in the duodenal bulb, in                            the first portion of the duodenum and in the second                            portion of the duodenum. Biopsies were taken with a                            cold forceps for histology. Estimated blood loss                            was minimal. Complications:            No immediate complications. Estimated Blood Loss:     Estimated blood loss was minimal. Impression:               - LA Grade C reflux esophagitis with no bleeding.                           - 4 cm hiatal hernia.                           - Gastroesophageal flap valve classified as Hill                            Grade III (minimal fold, loose to endoscope, hiatal                            hernia likely).                           - Gastritis. Biopsied.                           - Normal gastric fundus, gastric body and incisura.                           -  Non-bleeding duodenal diverticulum.                           - Normal mucosa was found in the duodenal bulb, in                            the first portion of the duodenum and in the second                            portion of the duodenum. Biopsied.                           -  Overall, the study is notable for worsening                            erosive esophagitis and slightly enlarged hiatal                            hernia compared with last time. Additionally, the                            antral gastritis was more pronounced on this study.                            Based on the degree of reflux symptoms, worsening                            erosive esophagitis, and increasing size of the                            hiatal hernia, would be reasonable to pursue                            surgical options for hiatal hernia repair and                            anti-reflux surgery. If planning on surgery, I                            recommend also obtaining a Gastric Emptying study                            as part of the pre-operative assessment. Recommendation:           - Patient has a contact number available for                            emergencies. The signs and symptoms of potential                            delayed complications were discussed with the                            patient. Return to normal  activities tomorrow.                            Written discharge instructions were provided to the                            patient.                           - Advance diet as tolerated.                           - Await pathology results.                           - Change Protonix to Dexilant (dexlansoprazole) 60                            mg PO daily. Can stop Protonix when able to start                            the [facility]. If Dexilant not approved by                            insurance, plan for Aciphex instead.                           - Follow-up with Dr. Carlean Purl in the GI clinic at                            appointment to be scheduled.                           - If considering concomittant laparoscopic hiatal                            hernia repair and TIF, will plan to schedule                            appointment with me  to discuss surgery in detail.                            Based on endoscopic findings of clear objective e/o                            reflux, no need for additional pH/Impedance testing. Gerrit Heck, MD 07/28/2022 9:00:36 AM

## 2022-07-28 NOTE — Telephone Encounter (Signed)
Please send Rx for Aciphex 20 mg PO BID, 90-day supply, RF 5

## 2022-07-28 NOTE — Telephone Encounter (Signed)
Referral faxed to [facility] 

## 2022-07-28 NOTE — Progress Notes (Signed)
Patient states very bad reflux x 2 months.  Very pale. States vomitng all night.  Upper gastric pain #7 at this time.  Zofran given per Kennith Gain CRNA.  Lot # [id number] Exp 09/2024.  Patient given slowly via IV.  IV wide open at this time

## 2022-07-29 ENCOUNTER — Telehealth: Payer: Self-pay | Admitting: Gastroenterology

## 2022-07-29 ENCOUNTER — Telehealth: Payer: Self-pay

## 2022-07-29 ENCOUNTER — Telehealth: Payer: Self-pay | Admitting: *Deleted

## 2022-07-29 ENCOUNTER — Other Ambulatory Visit (HOSPITAL_COMMUNITY): Payer: Self-pay

## 2022-07-29 ENCOUNTER — Other Ambulatory Visit (HOSPITAL_BASED_OUTPATIENT_CLINIC_OR_DEPARTMENT_OTHER): Payer: Self-pay

## 2022-07-29 NOTE — Telephone Encounter (Signed)
Inbound call from patient stating he needs a refill for ACIPHEX. Please advise.

## 2022-07-29 NOTE — Telephone Encounter (Signed)
Spoke with patient. Informed him that Aciphex has already been sent to his pharmacy yesterday. I let him know that we already initiated Prior Auth for Aciphex two times daily since his insurance is covering only once daily. Informed him if it is denied by his insurance we will send Nexium per Dr. Bryan Lemma. Patient verbalized understanding.

## 2022-07-29 NOTE — Telephone Encounter (Signed)
Patient Advocate Encounter   Received notification that prior authorization for RABEprazole Sodium '20MG'$  dr tablets is required/requested.   PA submitted on 07/29/22 to [employer] via CoverMyMeds Key [id number] Status is pending

## 2022-07-29 NOTE — Telephone Encounter (Signed)
  Follow up Call-     07/28/2022    7:31 AM 10/11/2021    7:38 AM  Call back number  Post procedure Call Back phone  # [phone]  Permission to leave phone message Yes Yes     Patient questions:   Message left to call us if necessary.

## 2022-08-02 ENCOUNTER — Other Ambulatory Visit (HOSPITAL_BASED_OUTPATIENT_CLINIC_OR_DEPARTMENT_OTHER): Payer: Self-pay

## 2022-08-02 NOTE — Telephone Encounter (Signed)
Yes, send in Rx for Nexium 40 mg BID.  Provided 90-day supply with RF 3.  If ineffective, will need to make an appeal to his insurance to allow high-dose Aciphex.  Thanks.

## 2022-08-02 NOTE — Telephone Encounter (Signed)
Patient Advocate Encounter  Received notification from [employer] that the request for prior authorization for RABEprazole Sodium 20MG dr tablets has been denied due to larger amount of the drug than allowed under our quantity limit rule.   Our guideline named GENERAL QUANTITY EXCEPTION CRITERIA (reviewed for rabeprazole 76m delayed-release tablets) requires the following rule be met for approval:You have tried and failed rabeprazole 234mdelayed-release tablets within the allowable formulary quantity limit of 1 tablet per day and it did not work for you.

## 2022-08-02 NOTE — Telephone Encounter (Signed)
Patient Advocate Encounter  Received notification from [employer] that the request for prior authorization for RABEprazole Sodium 20MG dr tablets has been denied due to larger amount of the drug than allowed under our quantity limit rule.   Our guideline named GENERAL QUANTITY EXCEPTION CRITERIA (reviewed for rabeprazole 20mg delayed-release tablets) requires the following rule be met for approval:You have tried and failed rabeprazole 20mg delayed-release tablets within the allowable formulary quantity limit of 1 tablet per day and it did not work for you.  

## 2022-08-03 ENCOUNTER — Telehealth: Payer: Self-pay

## 2022-08-03 ENCOUNTER — Other Ambulatory Visit (HOSPITAL_BASED_OUTPATIENT_CLINIC_OR_DEPARTMENT_OTHER): Payer: Self-pay

## 2022-08-03 ENCOUNTER — Other Ambulatory Visit: Payer: Self-pay

## 2022-08-03 MED ORDER — ESOMEPRAZOLE MAGNESIUM 40 MG PO CPDR
40.0000 mg | DELAYED_RELEASE_CAPSULE | Freq: Two times a day (BID) | ORAL | 3 refills | Status: DC
  Filled 2022-08-03: qty 18, 9d supply, fill #0
  Filled 2022-08-03: qty 162, 81d supply, fill #0
  Filled 2022-08-03: qty 18, 9d supply, fill #0

## 2022-08-03 NOTE — Telephone Encounter (Signed)
Neium 40 MG BID sent to the pharmacy. Patient is informed.

## 2022-08-03 NOTE — Telephone Encounter (Signed)
Contacted Bruce Woods to schedule follow up with Dr. Carlean Purl as requested on 9/28 EGD report. Bruce Woods stated he would call to schedule at a later date once he looks at his work schedule.

## 2022-08-04 DIAGNOSIS — F319 Bipolar disorder, unspecified: Secondary | ICD-10-CM | POA: Diagnosis not present

## 2022-08-04 DIAGNOSIS — F411 Generalized anxiety disorder: Secondary | ICD-10-CM | POA: Diagnosis not present

## 2022-08-04 DIAGNOSIS — F902 Attention-deficit hyperactivity disorder, combined type: Secondary | ICD-10-CM | POA: Diagnosis not present

## 2022-08-05 ENCOUNTER — Ambulatory Visit: Payer: Self-pay | Admitting: General Surgery

## 2022-08-05 ENCOUNTER — Ambulatory Visit: Payer: 59 | Admitting: Psychologist

## 2022-08-05 ENCOUNTER — Ambulatory Visit (HOSPITAL_BASED_OUTPATIENT_CLINIC_OR_DEPARTMENT_OTHER): Payer: 59 | Admitting: Family Medicine

## 2022-08-05 DIAGNOSIS — K449 Diaphragmatic hernia without obstruction or gangrene: Secondary | ICD-10-CM | POA: Diagnosis not present

## 2022-08-05 NOTE — H&P (Signed)
History of Present Illness: Bruce Woods is a 61 y.o. male who is seen today as an office consultation at the request of Dr. Bryan Lemma for evaluation of New Consultation (Hiatal hernia) .     Patient is a 61 year old male comes in secondary to reflux.  Patient states this been going on for approximate year.  He previously had undergone an endoscopy.  He also recently underwent a CT scan in July.  This did show a small 2 cm hiatal hernia.  Patient did recently undergo endoscopy secondary to reflux retching, and burping.  Patient was found to have a 4 cm hiatal hernia.  This appeared to be a slider.   Patient had no previous abdominal surgery.   Patient states that this time he is mainly able to take liquids.     Review of Systems: A complete review of systems was obtained from the patient.  I have reviewed this information and discussed as appropriate with the patient.  See HPI as well for other ROS.   Review of Systems  Constitutional: Negative.   HENT: Negative.    Eyes: Negative.   Respiratory: Negative.    Cardiovascular: Negative.   Gastrointestinal:  Positive for abdominal pain, heartburn, nausea and vomiting.  Genitourinary: Negative.   Musculoskeletal: Negative.   Skin: Negative.   Neurological: Negative.   Endo/Heme/Allergies: Negative.   Psychiatric/Behavioral: Negative.         Medical History: Past Medical History Past Medical History: Diagnosis Date  Anxiety    GERD (gastroesophageal reflux disease)    Hypertension    Sleep apnea        There is no problem list on file for this patient.     Past Surgical History History reviewed. No pertinent surgical history.     Allergies Allergies Allergen Reactions  Morphine Itching and Other (See Comments)     Resp distress, rash, tachycardia  Bupropion Hives and Other (See Comments)  Celecoxib Nausea and Other (See Comments)     Elevated LFT's      Current Outpatient Medications on File Prior to  Visit Medication Sig Dispense Refill  BIKTARVY 50-200-25 mg tablet Take 1 tablet by mouth once daily      citalopram (CELEXA) 20 MG tablet Take 20 mg by mouth once daily      losartan (COZAAR) 25 MG tablet Take 1 tablet by mouth once daily      methocarbamoL (ROBAXIN) 500 MG tablet 500 mg as needed.      ondansetron (ZOFRAN-ODT) 4 MG disintegrating tablet Take 4 mg by mouth every 8 (eight) hours as needed      pantoprazole (PROTONIX) 40 MG DR tablet Take 1 tablet by mouth 2 (two) times daily      sucralfate (CARAFATE) 1 gram tablet        tadalafiL (CIALIS) 5 MG tablet Take 1 tablet by mouth once daily      testosterone cypionate (DEPO-TESTOSTERONE) 200 mg/mL injection Inject into the muscle      zolpidem (AMBIEN) 10 mg tablet Take 1 tablet by mouth at bedtime as needed       No current facility-administered medications on file prior to visit.     Family History Family History Problem Relation Age of Onset  High blood pressure (Hypertension) Mother        Social History   Tobacco Use Smoking Status Former  Types: Cigarettes  Quit date: 1985  Years since quitting: 38.7 Smokeless Tobacco Never     Social History Social History  Socioeconomic History  Marital status: Married Tobacco Use  Smoking status: Former     Types: Cigarettes     Quit date: 1985     Years since quitting: 38.7  Smokeless tobacco: Never Vaping Use  Vaping Use: Never used Substance and Sexual Activity  Alcohol use: Yes  Drug use: Never      Objective:     Vitals:   08/05/22 0834 BP: 118/78 Pulse: 58 Temp: 37 C (98.6 F) SpO2: 99% Weight: 87.2 kg (192 lb 3.2 oz) Height: 172.7 cm ('5\' 8"'$ )   Body mass index is 29.22 kg/m.   Physical Exam Constitutional:      General: He is not in acute distress.    Appearance: Normal appearance.  HENT:     Head: Normocephalic.     Nose: No rhinorrhea.     Mouth/Throat:     Mouth: Mucous membranes are moist.     Pharynx: Oropharynx is clear.   Eyes:     General: No scleral icterus.    Pupils: Pupils are equal, round, and reactive to light.  Cardiovascular:     Rate and Rhythm: Normal rate.     Pulses: Normal pulses.  Pulmonary:     Effort: Pulmonary effort is normal. No respiratory distress.     Breath sounds: No stridor. No wheezing.  Abdominal:     General: Abdomen is flat. There is no distension.     Tenderness: There is no abdominal tenderness. There is no guarding or rebound.  Musculoskeletal:        General: Normal range of motion.     Cervical back: Normal range of motion and neck supple.  Skin:    General: Skin is warm and dry.     Capillary Refill: Capillary refill takes less than 2 seconds.     Coloration: Skin is not jaundiced.  Neurological:     General: No focal deficit present.     Mental Status: He is alert and oriented to person, place, and time. Mental status is at baseline.  Psychiatric:        Mood and Affect: Mood normal.        Thought Content: Thought content normal.        Judgment: Judgment normal.          Assessment and Plan: Diagnoses and all orders for this visit:   Hiatal hernia     Bruce Woods is a 61 y.o. male    1.  We will proceed to the OR for a robotic hiatal hernia repair with mesh and fundoplication 2. All risks and benefits were discussed with the patient, to generally include infection, bleeding, damage to surrounding structures, possible pneumothorax, and recurrence. Alternatives were offered and described.  All questions were answered and the patient voiced understanding of the procedure and wishes to proceed at this point.

## 2022-08-06 ENCOUNTER — Other Ambulatory Visit (HOSPITAL_BASED_OUTPATIENT_CLINIC_OR_DEPARTMENT_OTHER): Payer: Self-pay | Admitting: Family Medicine

## 2022-08-06 DIAGNOSIS — I10 Essential (primary) hypertension: Secondary | ICD-10-CM

## 2022-08-08 ENCOUNTER — Other Ambulatory Visit (HOSPITAL_BASED_OUTPATIENT_CLINIC_OR_DEPARTMENT_OTHER): Payer: Self-pay

## 2022-08-08 MED ORDER — LOSARTAN POTASSIUM 25 MG PO TABS
25.0000 mg | ORAL_TABLET | Freq: Every day | ORAL | 1 refills | Status: DC
  Filled 2022-08-08: qty 60, 60d supply, fill #0

## 2022-08-11 ENCOUNTER — Other Ambulatory Visit (HOSPITAL_COMMUNITY): Payer: Self-pay

## 2022-08-12 ENCOUNTER — Other Ambulatory Visit (HOSPITAL_BASED_OUTPATIENT_CLINIC_OR_DEPARTMENT_OTHER): Payer: Self-pay

## 2022-08-18 DIAGNOSIS — F902 Attention-deficit hyperactivity disorder, combined type: Secondary | ICD-10-CM | POA: Diagnosis not present

## 2022-09-01 NOTE — Progress Notes (Signed)
Surgical Instructions    Your procedure is scheduled on Tuesday, 09/13/22.  Report to [facility] Main Entrance "A" at 11:35 A.M., then check in with the Admitting office.  Call this number if you have problems the morning of surgery:  [phone]   If you have any questions prior to your surgery date call [phone]: Open Monday-Friday 8am-4pm If you experience any cold or flu symptoms such as cough, fever, chills, shortness of breath, etc. between now and your scheduled surgery, please notify us at the above number     Remember:  Do not eat after midnight the night before your surgery  You may drink clear liquids until 10:35am the morning of your surgery.   Clear liquids allowed are: Water, Non-Citrus Juices (without pulp), Carbonated Beverages, Clear Tea, Black Coffee ONLY (NO MILK, CREAM OR POWDERED CREAMER of any kind), and Gatorade  Patient Instructions  The night before surgery:  No food after midnight. ONLY clear liquids after midnight  The day of surgery (if you do NOT have diabetes):  Drink ONE (1) Pre-Surgery Clear Ensure by 10:35am the morning of surgery. Drink in one sitting. Do not sip.  This drink was given to you during your hospital  pre-op appointment visit. Nothing else to drink after completing the  Pre-Surgery Clear Ensure.           If you have questions, please contact your surgeon's office.     Take these medicines the morning of surgery with A SIP OF WATER:  bictegravir-emtricitabine-tenofovir AF (BIKTARVY)  citalopram (CELEXA)  ondansetron (ZOFRAN-ODT)  pantoprazole (PROTONIX)   IF NEEDED: methocarbamol (ROBAXIN)   As of today, STOP taking any Aspirin (unless otherwise instructed by your surgeon) Aleve, Naproxen, Ibuprofen, Motrin, Advil, Goody's, BC's, all herbal medications, fish oil, and all vitamins.           Do not wear jewelry or makeup. Do not wear lotions, powders, cologne or deodorant. Men may shave face and neck. Do not bring  valuables to the hospital. Do not wear nail polish, gel polish, artificial nails, or any other type of covering on natural nails (fingers and toes) If you have artificial nails or gel coating that need to be removed by a nail salon, please have this removed prior to surgery. Artificial nails or gel coating may interfere with anesthesia's ability to adequately monitor your vital signs.  [facility] is not responsible for any belongings or valuables.    Do NOT Smoke (Tobacco/Vaping)  24 hours prior to your procedure  If you use a CPAP at night, you may bring your mask for your overnight stay.   Contacts, glasses, hearing aids, dentures or partials may not be worn into surgery, please bring cases for these belongings   For patients admitted to the hospital, discharge time will be determined by your treatment team.   Patients discharged the day of surgery will not be allowed to drive home, and someone needs to stay with them for 24 hours.   SURGICAL WAITING ROOM VISITATION Patients having surgery or a procedure may have no more than 2 support people in the waiting area - these visitors may rotate.   Children under the age of 27 must have an adult with them who is not the patient. If the patient needs to stay at the hospital during part of their recovery, the visitor guidelines for inpatient rooms apply. Pre-op nurse will coordinate an appropriate time for 1 support person to accompany patient in pre-op.  This support person  may not rotate.   Please refer to [email or web address] for the visitor guidelines for Inpatients (after your surgery is over and you are in a regular room).    Special instructions:    Oral Hygiene is also important to reduce your risk of infection.  Remember - BRUSH YOUR TEETH THE MORNING OF SURGERY WITH YOUR REGULAR TOOTHPASTE   [facility]- Preparing For Surgery  Before surgery, you can play an important role.  Because skin is not sterile, your skin needs to be as free of germs as possible. You can reduce the number of germs on your skin by washing with CHG (chlorahexidine gluconate) Soap before surgery.  CHG is an antiseptic cleaner which kills germs and bonds with the skin to continue killing germs even after washing.     Please do not use if you have an allergy to CHG or antibacterial soaps. If your skin becomes reddened/irritated stop using the CHG.  Do not shave (including legs and underarms) for at least 48 hours prior to first CHG shower. It is OK to shave your face.  Please follow these instructions carefully.     Shower the NIGHT BEFORE SURGERY and the MORNING OF SURGERY with CHG Soap.   If you chose to wash your hair, wash your hair first as usual with your normal shampoo. After you shampoo, rinse your hair and body thoroughly to remove the shampoo.  Then [employer] and genitals (private parts) with your normal soap and rinse thoroughly to remove soap.  After that Use CHG Soap as you would any other liquid soap. You can apply CHG directly to the skin and wash gently with a scrungie or a clean washcloth.   Apply the CHG Soap to your body ONLY FROM THE NECK DOWN.  Do not use on open wounds or open sores. Avoid contact with your eyes, ears, mouth and genitals (private parts). Wash Face and genitals (private parts)  with your normal soap.   Wash thoroughly, paying special attention to the area where your surgery will be performed.  Thoroughly rinse your body with warm water from the neck down.  DO NOT shower/wash with your normal soap after using and rinsing off the CHG Soap.  Pat yourself dry with a CLEAN TOWEL.  Wear CLEAN PAJAMAS to bed the night before surgery  Place CLEAN SHEETS on your bed the night before your surgery  DO NOT SLEEP WITH PETS.   Day of Surgery: Take a shower with CHG soap. Wear Clean/Comfortable clothing the morning of surgery Do not apply any  deodorants/lotions.   Remember to brush your teeth WITH YOUR REGULAR TOOTHPASTE.    If you received a COVID test during your pre-op visit, it is requested that you wear a mask when out in public, stay away from anyone that may not be feeling well, and notify your surgeon if you develop symptoms. If you have been in contact with anyone that has tested positive in the last 10 days, please notify your surgeon.    Please read over the following fact sheets that you were given.

## 2022-09-02 ENCOUNTER — Encounter (HOSPITAL_COMMUNITY)
Admission: RE | Admit: 2022-09-02 | Discharge: 2022-09-02 | Disposition: A | Discharge status: Home / Self Care | Payer: 59 | Source: Ambulatory Visit | Attending: General Surgery | Admitting: General Surgery

## 2022-09-02 ENCOUNTER — Encounter (HOSPITAL_COMMUNITY): Payer: Self-pay

## 2022-09-02 ENCOUNTER — Other Ambulatory Visit: Payer: Self-pay

## 2022-09-02 VITALS — BP 143/93 | HR 68 | Temp 97.5°F | Resp 17 | Ht 68.0 in | Wt 189.3 lb

## 2022-09-02 DIAGNOSIS — Z01818 Encounter for other preprocedural examination: Secondary | ICD-10-CM | POA: Insufficient documentation

## 2022-09-02 DIAGNOSIS — I10 Essential (primary) hypertension: Secondary | ICD-10-CM | POA: Diagnosis not present

## 2022-09-02 HISTORY — DX: Personal history of other diseases of the digestive system: Z87.19

## 2022-09-02 HISTORY — DX: Sleep apnea, unspecified: G47.30

## 2022-09-02 HISTORY — DX: Essential (primary) hypertension: I10

## 2022-09-02 LAB — BASIC METABOLIC PANEL
Anion gap: 9 (ref 5–15)
BUN: 11 mg/dL (ref 8–23)
CO2: 29 mmol/L (ref 22–32)
Calcium: 9.1 mg/dL (ref 8.9–10.3)
Chloride: 103 mmol/L (ref 98–111)
Creatinine, Ser: 1.53 mg/dL — ABNORMAL HIGH (ref 0.61–1.24)
GFR, Estimated: 51 mL/min — ABNORMAL LOW (ref >60)
Glucose, Bld: 105 mg/dL — ABNORMAL HIGH (ref 70–99)
Potassium: 4.4 mmol/L (ref 3.5–5.1)
Sodium: 141 mmol/L (ref 135–145)

## 2022-09-02 LAB — CBC
HCT: 54.2 % — ABNORMAL HIGH (ref 39.0–52.0)
Hemoglobin: 18.2 g/dL — ABNORMAL HIGH (ref 13.0–17.0)
MCH: 29.3 pg (ref 26.0–34.0)
MCHC: 33.6 g/dL (ref 30.0–36.0)
MCV: 87.1 fL (ref 80.0–100.0)
Platelets: 166 10*3/uL (ref 150–400)
RBC: 6.22 MIL/uL — ABNORMAL HIGH (ref 4.22–5.81)
RDW: 15.9 % — ABNORMAL HIGH (ref 11.5–15.5)
WBC: 5.6 10*3/uL (ref 4.0–10.5)
nRBC: 0 % (ref 0.0–0.2)

## 2022-09-02 NOTE — Progress Notes (Addendum)
PCP - Raymond [place] Cardiologist - Kirk Ruths but he cleared him after the stress test in 2018 Gastroenterologist: Carlean Purl ID: Michel Bickers Pulmonologist: Christinia Gully - only saw briefly after getting VATS procedure- sarcoidosis "fizzled" out  PPM/ICD - denies   Chest x-ray - n/a EKG - 06/01/22 Stress Test - 06/13/22 ECHO - 05/31/17 Cardiac Cath - denies  Sleep Study - was told he had sleep apnea during recent EGD, not officially diagnosed CPAP - not yet   Follow your surgeon's instructions on when to stop Aspirin.  If no instructions were given by your surgeon then you will need to call the office to get those instructions.     ERAS Protcol -yes PRE-SURGERY Ensure or G2- ensure given  COVID TEST- not needed  Anesthesia review: yes, complex anesthesia history and severe undiagnosed sleep apnea. Had laryngospasms in PACU during prior surgery- needed oral airway for 70mn in recovery.   Patient denies shortness of breath, fever, cough and chest pain at PAT appointment   All instructions explained to the patient, with a verbal understanding of the material. Patient agrees to go over the instructions while at home for a better understanding. Patient also instructed to self quarantine after being tested for COVID-19. The opportunity to ask questions was provided.

## 2022-09-05 ENCOUNTER — Other Ambulatory Visit (HOSPITAL_COMMUNITY): Payer: Self-pay

## 2022-09-05 NOTE — Progress Notes (Signed)
Anesthesia Chart Review:  Case: [id number] Date/Time: 09/13/22 1100   Procedure: XI ROBOTIC ASSISTED HIATAL HERNIA REPAIR WITH FUNDOPLICATION WITH MESH   Anesthesia type: General   Pre-op diagnosis: HIATAL HERNIA   Location: [facility] OR ROOM 10 / [facility]   Surgeons: Ralene Ok, MD       DISCUSSION: Patient is a 61 year old male scheduled for the above procedure.  History includes former smoker (quit 11/01/83), HTN, OSA, HIV, Sarcoidosis (question on 2000), GERD, hiatal hernia, gastritis/duodenitis.   Labs from 09/02/22 show H/H 18.2/54.2, PLT 166, consistent with recent prior results (HGH 17.1-18.3 , HCT 50.4-52.3 since 05/30/22).  VS: BP (!) 143/93   Pulse 68   Temp (!) 36.4 C   Resp 17   Ht '5\' 8"'$  (1.727 m)   Wt 85.9 kg   SpO2 98%   BMI 28.78 kg/m    PROVIDERS: [place], Blondell Reveal, MD is PCP  Silvano Rusk, MD is GI Michel Bickers, MD is ID.  Last visit 10/19/21.  HIV remained under excellent long-term control at that time on Biktarvy.  1 year follow-up planned. He is not followed routinely by cardiology but was evaluated by Kirk Ruths, MD in 2018 for chest pain, palpitations, presyncope. In August 2018 he had a non-ischemic ETT, event monitor showed SB-ST, and echo showed LVEF 55-60%, mild LVH, no regional wall motion abnormalities, grade 1 diastolic dysfunction, trivial AI, mild MR.   LABS: {CHL AN LABS REVIEWED:112001::"Labs reviewed: Acceptable for surgery."} (all labs ordered are listed, but only abnormal results are displayed)  Labs Reviewed  BASIC METABOLIC PANEL - Abnormal; Notable for the following components:      Result Value   Glucose, Bld 105 (*)    Creatinine, Ser 1.53 (*)    GFR, Estimated 51 (*)    All other components within normal limits  CBC - Abnormal; Notable for the following components:   RBC 6.22 (*)    Hemoglobin 18.2 (*)    HCT 54.2 (*)    RDW 15.9 (*)    All other components within normal limits     IMAGES: CT Abd/pelvis  05/30/22: IMPRESSION: 1. Long segment of small bowel submucosal edema involving the mid to distal ileum. No evidence of high-grade obstruction. Findings suggest regional enteritis versus inflammatory bowel disease or less likely connective tissue disease. Ischemia not favored as no atherosclerotic disease of the mesenteric arteries. 2. Terminal ileum appears normal.  Colon appears normal.   [place] Abd 05/30/22: MPRESSION: No cholecystitis or cholelithiasis. CBD is withi normal limits. Likely hepatic steatosis. Left lobe of the liver is not well delineated due to overlying bowel-gas.  1V PCXR 05/30/22: FINDINGS: Normal heart size, mediastinal contours, and pulmonary vascularity. Lungs clear. No infiltrate, pleural effusion, or pneumothorax. Osseous structures unremarkable. IMPRESSION: No acute abnormalities.  CT Soft Tissue Neck 03/15/22: IMPRESSION: Normal CT of the neck.   EKG: 7/32/23: Sinus rhythm Prolonged PR interval [PR 224 ms] Prominent P waves, nondiagnostic No significant change since last tracing Confirmed by Isla Pence [phone]) on 05/30/2022 5:44:15 PM   CV: ETT 06/13/17: Blood pressure demonstrated a normal response to exercise. There was no ST segment deviation noted during stress. No T wave inversion was noted during stress.   Elevated diastolic BP at baseline. Normal BP response during exercise.   Normal ECG stress test.   Vagal symptoms were reported during recovery, but with normal hemodynamic parameters.   Cardiac event monitor 06/01/17-06/30/17: Study Highlights: Sinus bradycardia, NSR and sinus tachycardia    Echo 05/31/17:  Study Conclusions  - Left ventricle: The cavity size was normal. Wall thickness was    increased in a pattern of mild LVH. Systolic function was normal.    The estimated ejection fraction was in the range of 55% to 60%.    Wall motion was normal; there were no regional wall motion    abnormalities. Doppler parameters are  consistent with abnormal    left ventricular relaxation (grade 1 diastolic dysfunction).  - Aortic valve: There was trivial regurgitation.  - Mitral valve: There was mild regurgitation.  - Left atrium: The atrium was mildly dilated. Volume/bsa, ES,    (1-plane Simpson&'s, A2C): 35.5 ml/m^2.   Past Medical History:  Diagnosis Date   COVID-19 07/2021   DEPRESSION    Gastritis and gastroduodenitis    GERD (gastroesophageal reflux disease)    History of hiatal hernia    HIV DISEASE    Hypertension    IBS (irritable bowel syndrome)    Sarcoidosis 2000   question of   Sleep apnea    undiagnosed- but during upper endoscopy they determined he had severe sleep apnea   Ulnar nerve compression, right     Past Surgical History:  Procedure Laterality Date   COLONOSCOPY  2010   ESOPHAGOGASTRODUODENOSCOPY  2002   LUNG SURGERY  10/31/1998   vats   TONSILLECTOMY AND ADENOIDECTOMY     ULNAR NERVE TRANSPOSITION Right 05/17/2018   Procedure: RIGHT ULNAR NERVE DECOMPRESSION/TRANSPOSITION;  Surgeon: Leanora Cover, MD;  Location: [facility];  Service: Orthopedics;  Laterality: Right;    MEDICATIONS:  anastrozole (ARIMIDEX) 1 MG tablet   anastrozole (ARIMIDEX) 1 MG tablet   bictegravir-emtricitabine-tenofovir AF (BIKTARVY) 50-200-25 MG TABS tablet   citalopram (CELEXA) 20 MG tablet   esomeprazole (NEXIUM) 40 MG capsule   influenza vac split quadrivalent PF (FLUARIX QUADRIVALENT) 0.5 ML injection   losartan (COZAAR) 25 MG tablet   methocarbamol (ROBAXIN) 500 MG tablet   ondansetron (ZOFRAN-ODT) 4 MG disintegrating tablet   pantoprazole (PROTONIX) 40 MG tablet   PRESCRIPTION MEDICATION   tadalafil (CIALIS) 5 MG tablet   testosterone cypionate (DEPOTESTOSTERONE CYPIONATE) 200 MG/ML injection   zolpidem (AMBIEN) 10 MG tablet    ondansetron (ZOFRAN) 4 mg in sodium chloride 0.9 % 50 mL IVPB

## 2022-09-06 ENCOUNTER — Encounter (HOSPITAL_COMMUNITY): Payer: Self-pay

## 2022-09-06 NOTE — Anesthesia Preprocedure Evaluation (Addendum)
Anesthesia Evaluation  Patient identified by MRN, date of birth, ID band Patient awake    Reviewed: Allergy & Precautions, NPO status , Patient's Chart, lab work & pertinent test results  History of Anesthesia Complications Negative for: history of anesthetic complications  Airway Mallampati: II  TM Distance: >3 FB Neck ROM: Full    Dental no notable dental hx. (+) Dental Advisory Given   Pulmonary former smoker   Pulmonary exam normal        Cardiovascular hypertension, Pt. on medications Normal cardiovascular exam     Neuro/Psych  PSYCHIATRIC DISORDERS Anxiety Depression       GI/Hepatic hiatal hernia,GERD  ,,  Endo/Other    Renal/GU      Musculoskeletal   Abdominal   Peds  Hematology  (+) HIV  Anesthesia Other Findings   Reproductive/Obstetrics                             Anesthesia Physical Anesthesia Plan  ASA: 2  Anesthesia Plan: General   Post-op Pain Management: Tylenol PO (pre-op)*, Ketamine IV* and Toradol IV (intra-op)*   Induction: Intravenous, Rapid sequence and Cricoid pressure planned  PONV Risk Score and Plan: 4 or greater and Ondansetron, Dexamethasone, Midazolam and Scopolamine patch - Pre-op  Airway Management Planned: Oral ETT  Additional Equipment:   Intra-op Plan:   Post-operative Plan: Extubation in OR  Informed Consent: I have reviewed the patients History and Physical, chart, labs and discussed the procedure including the risks, benefits and alternatives for the proposed anesthesia with the patient or authorized representative who has indicated his/her understanding and acceptance.     Dental advisory given  Plan Discussed with: Anesthesiologist and CRNA  Anesthesia Plan Comments: (PAT note written 09/06/2022 by Myra Gianotti, PA-C.  )       Anesthesia Quick Evaluation

## 2022-09-07 ENCOUNTER — Other Ambulatory Visit (HOSPITAL_BASED_OUTPATIENT_CLINIC_OR_DEPARTMENT_OTHER): Payer: Self-pay

## 2022-09-07 ENCOUNTER — Other Ambulatory Visit (HOSPITAL_COMMUNITY): Payer: Self-pay

## 2022-09-08 DIAGNOSIS — F902 Attention-deficit hyperactivity disorder, combined type: Secondary | ICD-10-CM | POA: Diagnosis not present

## 2022-09-12 ENCOUNTER — Other Ambulatory Visit (HOSPITAL_COMMUNITY): Payer: Self-pay

## 2022-09-13 ENCOUNTER — Encounter (HOSPITAL_COMMUNITY)
Admission: RE | Disposition: A | Discharge status: Home / Self Care | Payer: Self-pay | Source: Home / Self Care | Attending: General Surgery

## 2022-09-13 ENCOUNTER — Ambulatory Visit (HOSPITAL_COMMUNITY): Payer: 59 | Admitting: Vascular Surgery

## 2022-09-13 ENCOUNTER — Encounter (HOSPITAL_COMMUNITY): Payer: Self-pay | Admitting: General Surgery

## 2022-09-13 ENCOUNTER — Observation Stay (HOSPITAL_COMMUNITY)
Admission: RE | Admit: 2022-09-13 | Discharge: 2022-09-14 | Disposition: A | Discharge status: Home / Self Care | Payer: 59 | Attending: General Surgery | Admitting: General Surgery

## 2022-09-13 ENCOUNTER — Ambulatory Visit (HOSPITAL_BASED_OUTPATIENT_CLINIC_OR_DEPARTMENT_OTHER): Payer: 59 | Admitting: Anesthesiology

## 2022-09-13 ENCOUNTER — Other Ambulatory Visit: Payer: Self-pay

## 2022-09-13 DIAGNOSIS — K449 Diaphragmatic hernia without obstruction or gangrene: Principal | ICD-10-CM

## 2022-09-13 DIAGNOSIS — B20 Human immunodeficiency virus [HIV] disease: Secondary | ICD-10-CM | POA: Insufficient documentation

## 2022-09-13 DIAGNOSIS — K219 Gastro-esophageal reflux disease without esophagitis: Secondary | ICD-10-CM | POA: Insufficient documentation

## 2022-09-13 DIAGNOSIS — I10 Essential (primary) hypertension: Secondary | ICD-10-CM | POA: Insufficient documentation

## 2022-09-13 DIAGNOSIS — Z79899 Other long term (current) drug therapy: Secondary | ICD-10-CM | POA: Insufficient documentation

## 2022-09-13 DIAGNOSIS — Z85828 Personal history of other malignant neoplasm of skin: Secondary | ICD-10-CM | POA: Insufficient documentation

## 2022-09-13 DIAGNOSIS — Z87891 Personal history of nicotine dependence: Secondary | ICD-10-CM | POA: Insufficient documentation

## 2022-09-13 DIAGNOSIS — Z9889 Other specified postprocedural states: Secondary | ICD-10-CM | POA: Diagnosis present

## 2022-09-13 HISTORY — PX: XI ROBOTIC ASSISTED HIATAL HERNIA REPAIR: SHX6889

## 2022-09-13 SURGERY — REPAIR, HERNIA, HIATAL, ROBOT-ASSISTED
Anesthesia: General | Site: Abdomen

## 2022-09-13 MED ORDER — ROCURONIUM BROMIDE 10 MG/ML (PF) SYRINGE
PREFILLED_SYRINGE | INTRAVENOUS | Status: DC | PRN
  Administered 2022-09-13: 70 mg via INTRAVENOUS

## 2022-09-13 MED ORDER — LIDOCAINE 2% (20 MG/ML) 5 ML SYRINGE
INTRAMUSCULAR | Status: DC | PRN
  Administered 2022-09-13: 100 mg via INTRAVENOUS

## 2022-09-13 MED ORDER — FENTANYL CITRATE (PF) 250 MCG/5ML IJ SOLN
INTRAMUSCULAR | Status: AC
  Filled 2022-09-13: qty 5

## 2022-09-13 MED ORDER — SUGAMMADEX SODIUM 200 MG/2ML IV SOLN
INTRAVENOUS | Status: DC | PRN
  Administered 2022-09-13: 200 mg via INTRAVENOUS

## 2022-09-13 MED ORDER — KETOROLAC TROMETHAMINE 30 MG/ML IJ SOLN
INTRAMUSCULAR | Status: DC | PRN
  Administered 2022-09-13: 30 mg via INTRAVENOUS

## 2022-09-13 MED ORDER — BUPIVACAINE-EPINEPHRINE 0.25% -1:200000 IJ SOLN
INTRAMUSCULAR | Status: DC | PRN
  Administered 2022-09-13: 24 mL

## 2022-09-13 MED ORDER — BUPIVACAINE HCL (PF) 0.25 % IJ SOLN
INTRAMUSCULAR | Status: AC
  Filled 2022-09-13: qty 30

## 2022-09-13 MED ORDER — MIDAZOLAM HCL 2 MG/2ML IJ SOLN
INTRAMUSCULAR | Status: DC | PRN
  Administered 2022-09-13: 2 mg via INTRAVENOUS

## 2022-09-13 MED ORDER — SODIUM CHLORIDE (PF) 0.9 % IJ SOLN
INTRAMUSCULAR | Status: DC | PRN
  Administered 2022-09-13: 40 mL

## 2022-09-13 MED ORDER — PROMETHAZINE HCL 25 MG/ML IJ SOLN: 6.2500 mg | INTRAMUSCULAR | Status: DC | PRN

## 2022-09-13 MED ORDER — SUCCINYLCHOLINE CHLORIDE 200 MG/10ML IV SOSY
PREFILLED_SYRINGE | INTRAVENOUS | Status: DC | PRN
  Administered 2022-09-13: 140 mg via INTRAVENOUS

## 2022-09-13 MED ORDER — SCOPOLAMINE 1 MG/3DAYS TD PT72
1.0000 | MEDICATED_PATCH | TRANSDERMAL | Status: DC
  Administered 2022-09-13: 1.5 mg via TRANSDERMAL
  Filled 2022-09-13: qty 1

## 2022-09-13 MED ORDER — MIDAZOLAM HCL 2 MG/2ML IJ SOLN
INTRAMUSCULAR | Status: AC
  Filled 2022-09-13: qty 2

## 2022-09-13 MED ORDER — FENTANYL CITRATE (PF) 100 MCG/2ML IJ SOLN
25.0000 ug | INTRAMUSCULAR | Status: DC | PRN
  Administered 2022-09-13: 50 ug via INTRAVENOUS

## 2022-09-13 MED ORDER — PHENYLEPHRINE HCL-NACL 20-0.9 MG/250ML-% IV SOLN
INTRAVENOUS | Status: DC | PRN
  Administered 2022-09-13: 20 ug/min via INTRAVENOUS

## 2022-09-13 MED ORDER — CHLORHEXIDINE GLUCONATE CLOTH 2 % EX PADS: 6.0000 | MEDICATED_PAD | Freq: Once | CUTANEOUS | Status: DC

## 2022-09-13 MED ORDER — LIDOCAINE IN D5W 4-5 MG/ML-% IV SOLN
INTRAVENOUS | Status: DC | PRN
  Administered 2022-09-13: 25 ug/kg/min via INTRAVENOUS

## 2022-09-13 MED ORDER — AMISULPRIDE (ANTIEMETIC) 5 MG/2ML IV SOLN: 10.0000 mg | Freq: Once | INTRAVENOUS | Status: DC | PRN

## 2022-09-13 MED ORDER — PROPOFOL 10 MG/ML IV BOLUS
INTRAVENOUS | Status: AC
  Filled 2022-09-13: qty 20

## 2022-09-13 MED ORDER — CEFAZOLIN SODIUM-DEXTROSE 2-4 GM/100ML-% IV SOLN
2.0000 g | INTRAVENOUS | Status: AC
  Administered 2022-09-13: 2 g via INTRAVENOUS

## 2022-09-13 MED ORDER — LACTATED RINGERS IV SOLN: INTRAVENOUS | Status: DC

## 2022-09-13 MED ORDER — DEXAMETHASONE SODIUM PHOSPHATE 10 MG/ML IJ SOLN
INTRAMUSCULAR | Status: DC | PRN
  Administered 2022-09-13: 10 mg via INTRAVENOUS

## 2022-09-13 MED ORDER — ORAL CARE MOUTH RINSE: 15.0000 mL | Freq: Once | OROMUCOSAL | Status: AC

## 2022-09-13 MED ORDER — 0.9 % SODIUM CHLORIDE (POUR BTL) OPTIME
TOPICAL | Status: DC | PRN
  Administered 2022-09-13: 500 mL

## 2022-09-13 MED ORDER — BUPIVACAINE LIPOSOME 1.3 % IJ SUSP
INTRAMUSCULAR | Status: AC
  Filled 2022-09-13: qty 20

## 2022-09-13 MED ORDER — LACTATED RINGERS IV SOLN: INTRAVENOUS | Status: DC | PRN

## 2022-09-13 MED ORDER — ONDANSETRON HCL 4 MG/2ML IJ SOLN
INTRAMUSCULAR | Status: AC
  Filled 2022-09-13: qty 2

## 2022-09-13 MED ORDER — ONDANSETRON HCL 4 MG/2ML IJ SOLN: 4.0000 mg | Freq: Four times a day (QID) | INTRAMUSCULAR | Status: DC | PRN

## 2022-09-13 MED ORDER — DEXAMETHASONE SODIUM PHOSPHATE 10 MG/ML IJ SOLN: 4.0000 mg | INTRAMUSCULAR | Status: DC

## 2022-09-13 MED ORDER — KETAMINE HCL 10 MG/ML IJ SOLN
INTRAMUSCULAR | Status: DC | PRN
  Administered 2022-09-13: 10 mg via INTRAVENOUS
  Administered 2022-09-13: 40 mg via INTRAVENOUS

## 2022-09-13 MED ORDER — ACETAMINOPHEN 500 MG PO TABS: 1000.0000 mg | ORAL_TABLET | ORAL | Status: DC

## 2022-09-13 MED ORDER — HYDROMORPHONE HCL 1 MG/ML IJ SOLN
1.0000 mg | INTRAMUSCULAR | Status: DC | PRN
  Administered 2022-09-13 – 2022-09-14 (×5): 1 mg via INTRAVENOUS
  Filled 2022-09-13 (×4): qty 1

## 2022-09-13 MED ORDER — CHLORHEXIDINE GLUCONATE 0.12 % MT SOLN
15.0000 mL | Freq: Once | OROMUCOSAL | Status: AC
  Administered 2022-09-13: 15 mL via OROMUCOSAL
  Filled 2022-09-13: qty 15

## 2022-09-13 MED ORDER — STERILE WATER FOR IRRIGATION IR SOLN
Status: DC | PRN
  Administered 2022-09-13: 500 mL

## 2022-09-13 MED ORDER — DEXTROSE-NACL 5-0.9 % IV SOLN: INTRAVENOUS | Status: DC

## 2022-09-13 MED ORDER — ONDANSETRON 4 MG PO TBDP: 4.0000 mg | ORAL_TABLET | Freq: Four times a day (QID) | ORAL | Status: DC | PRN

## 2022-09-13 MED ORDER — FENTANYL CITRATE (PF) 100 MCG/2ML IJ SOLN
INTRAMUSCULAR | Status: AC
  Filled 2022-09-13: qty 2

## 2022-09-13 MED ORDER — ACETAMINOPHEN 500 MG PO TABS: 1000.0000 mg | ORAL_TABLET | Freq: Once | ORAL | Status: DC

## 2022-09-13 MED ORDER — KETAMINE HCL 50 MG/5ML IJ SOSY
PREFILLED_SYRINGE | INTRAMUSCULAR | Status: AC
  Filled 2022-09-13: qty 5

## 2022-09-13 MED ORDER — ONDANSETRON HCL 4 MG/2ML IJ SOLN
INTRAMUSCULAR | Status: DC | PRN
  Administered 2022-09-13: 4 mg via INTRAVENOUS

## 2022-09-13 MED ORDER — PROPOFOL 10 MG/ML IV BOLUS
INTRAVENOUS | Status: DC | PRN
  Administered 2022-09-13: 200 mg via INTRAVENOUS

## 2022-09-13 MED ORDER — HYDROMORPHONE HCL 1 MG/ML IJ SOLN
INTRAMUSCULAR | Status: AC
  Filled 2022-09-13: qty 1

## 2022-09-13 MED ORDER — CEFAZOLIN SODIUM-DEXTROSE 2-4 GM/100ML-% IV SOLN
INTRAVENOUS | Status: AC
  Filled 2022-09-13: qty 100

## 2022-09-13 MED ORDER — ENSURE PRE-SURGERY PO LIQD: 296.0000 mL | Freq: Once | ORAL | Status: DC

## 2022-09-13 MED ORDER — DEXAMETHASONE SODIUM PHOSPHATE 10 MG/ML IJ SOLN
INTRAMUSCULAR | Status: AC
  Filled 2022-09-13: qty 1

## 2022-09-13 MED ORDER — FENTANYL CITRATE (PF) 250 MCG/5ML IJ SOLN
INTRAMUSCULAR | Status: DC | PRN
  Administered 2022-09-13: 50 ug via INTRAVENOUS
  Administered 2022-09-13: 100 ug via INTRAVENOUS
  Administered 2022-09-13: 50 ug via INTRAVENOUS

## 2022-09-13 SURGICAL SUPPLY — 51 items
ADH SKN CLS APL DERMABOND .7 (GAUZE/BANDAGES/DRESSINGS) ×1
APL PRP STRL LF DISP 70% ISPRP (MISCELLANEOUS) ×1
CANNULA REDUC XI 12-8 STAPL (CANNULA) ×1
CANNULA REDUCER 12-8 DVNC XI (CANNULA) ×2 IMPLANT
CHLORAPREP W/TINT 26 (MISCELLANEOUS) ×2 IMPLANT
COVER MAYO STAND STRL (DRAPES) ×2 IMPLANT
COVER SURGICAL LIGHT HANDLE (MISCELLANEOUS) ×2 IMPLANT
DEFOGGER SCOPE WARMER CLEARIFY (MISCELLANEOUS) ×2 IMPLANT
DERMABOND ADVANCED .7 DNX12 (GAUZE/BANDAGES/DRESSINGS) ×2 IMPLANT
DEVICE TROCAR PUNCTURE CLOSURE (ENDOMECHANICALS) ×2 IMPLANT
DRAPE ARM DVNC X/XI (DISPOSABLE) ×8 IMPLANT
DRAPE CARDIOVASC SPLIT 88X140 (DRAPES) ×2 IMPLANT
DRAPE COLUMN DVNC XI (DISPOSABLE) ×2 IMPLANT
DRAPE DA VINCI XI ARM (DISPOSABLE) ×4
DRAPE DA VINCI XI COLUMN (DISPOSABLE) ×1
DRAPE ORTHO SPLIT 77X108 STRL (DRAPES) ×1
DRAPE SURG ORHT 6 SPLT 77X108 (DRAPES) ×2 IMPLANT
ELECT REM PT RETURN 9FT ADLT (ELECTROSURGICAL) ×1
ELECTRODE REM PT RTRN 9FT ADLT (ELECTROSURGICAL) ×2 IMPLANT
GLOVE SURG SYN 7.5  E (GLOVE) ×3
GLOVE SURG SYN 7.5 E (GLOVE) ×3 IMPLANT
GLOVE SURG SYN 7.5 PF PI (GLOVE) ×6 IMPLANT
GOWN STRL REUS W/ TWL LRG LVL3 (GOWN DISPOSABLE) ×2 IMPLANT
GOWN STRL REUS W/ TWL XL LVL3 (GOWN DISPOSABLE) ×4 IMPLANT
GOWN STRL REUS W/TWL 2XL LVL3 (GOWN DISPOSABLE) ×2 IMPLANT
GOWN STRL REUS W/TWL LRG LVL3 (GOWN DISPOSABLE) ×1
GOWN STRL REUS W/TWL XL LVL3 (GOWN DISPOSABLE) ×2
KIT BASIN OR (CUSTOM PROCEDURE TRAY) ×2 IMPLANT
KIT TURNOVER KIT B (KITS) IMPLANT
MARKER SKIN DUAL TIP RULER LAB (MISCELLANEOUS) ×2 IMPLANT
MESH BIO-A 7X10 SYN MAT (Mesh General) ×2 IMPLANT
NDL 22X1.5 STRL (OR ONLY) (MISCELLANEOUS) ×2 IMPLANT
NDL INSUFFLATION 14GA 120MM (NEEDLE) ×2 IMPLANT
NEEDLE 22X1.5 STRL (OR ONLY) (MISCELLANEOUS) ×1 IMPLANT
NEEDLE INSUFFLATION 14GA 120MM (NEEDLE) ×1 IMPLANT
SEAL CANN UNIV 5-8 DVNC XI (MISCELLANEOUS) ×6 IMPLANT
SEAL XI 5MM-8MM UNIVERSAL (MISCELLANEOUS) ×3
SEALER VESSEL DA VINCI XI (MISCELLANEOUS) ×1
SEALER VESSEL EXT DVNC XI (MISCELLANEOUS) ×2 IMPLANT
SET IRRIG TUBING LAPAROSCOPIC (IRRIGATION / IRRIGATOR) ×2 IMPLANT
SET TUBE SMOKE EVAC HIGH FLOW (TUBING) ×2 IMPLANT
STAPLER CANNULA SEAL DVNC XI (STAPLE) ×2 IMPLANT
STAPLER CANNULA SEAL XI (STAPLE) ×1
STOPCOCK 4 WAY LG BORE MALE ST (IV SETS) ×2 IMPLANT
SUT ETHIBOND 0 36 GRN (SUTURE) ×4 IMPLANT
SUT MNCRL AB 4-0 PS2 18 (SUTURE) ×2 IMPLANT
SUT SILK 0 SH 30 (SUTURE) ×2 IMPLANT
SUT VICRYL 0 UR6 27IN ABS (SUTURE) ×2 IMPLANT
SYR 30ML SLIP (SYRINGE) ×2 IMPLANT
TRAY LAPAROSCOPIC MC (CUSTOM PROCEDURE TRAY) ×2 IMPLANT
TROCAR ADV FIXATION 5X100MM (TROCAR) ×2 IMPLANT

## 2022-09-13 NOTE — Op Note (Signed)
09/13/2022  9:28 AM  PATIENT:  Bruce Woods  61 y.o. male  PRE-OPERATIVE DIAGNOSIS:  HIATAL HERNIA, GERD  POST-OPERATIVE DIAGNOSIS: Type 1 Paraesophageal HIATAL HERNIA, GERD  PROCEDURE:  Procedure(s): XI ROBOTIC ASSISTED HIATAL HERNIA REPAIR WITH TOUPET FUNDOPLICATION WITH MESH (N/A)  SURGEON:  Surgeon(s) and Role:    * Ralene Ok, MD - Primary  ASSISTANTS: Pryor Curia, RNFA   ANESTHESIA:   local, regional, and general  EBL:  25 mL   BLOOD ADMINISTERED:none  DRAINS: none   LOCAL MEDICATIONS USED:  BUPIVICAINE  and OTHER experel  SPECIMEN:  No Specimen  DISPOSITION OF SPECIMEN:  N/A  COUNTS:  YES  TOURNIQUET:  * No tourniquets in log *  DICTATION: .Dragon Dictation  Findings: Pt had a Type 1 PEH.  Pt underwent Toupet fundoplication.  The patient was taken back to the operating room and placed in the supine position with bilateral SCDs in place. The patient was prepped and draped in the usual sterile fashion. After appropriate antibiotics were confirmed a timeout was called and all facts were verified.   A Veress needle technique was used to insufflate the abdomen to 15 mm of mercury the paramedian stab incision. Subsequent to this an 8 mm trocar was introduced as was a 8 millimeter camera. At this time the subsequent robotic trochars x3, were then placed adjacent to this trocar approximately 8-10 cm away. Each trocar was inserted under direct visualization, there were total of 4 trochars. A 5m trocar was placed in the midclavicular line.  A 0vicryl was placed to help with closure at the end of the case to the 192mport site and the supraumbilical port site.The assistant trocar was then placed in the right lower quadrant under direct visualization. The Nathanson retractor was then visualized inserted into the abdomen and the incision just to the left of the falciform ligament. This was then placed to retract the liver appropriately. At this time the patient was  positioned in reverse Trendelenburg.   At this time the robot patient cart was brought to the bedside and placed in good position and the arms were docked to the trochars appropriately. At this time I proceeded to incised the gastrohepatic ligament.  At this time I proceeded to mobilize the stomach inferiorly and visualize the right crus. The peritoneum over the right crus was incised and right crus was identified. I proceeded to dissect this inferiorly until the left crus was seen joining the right crus. Once the right crus was adequately dissected we turned our to the left crus which was dissected away. This required traction of the stomach to the right side. Once this was visualized we then proceeded to circumferentially dissect the esophagus away from the surrounding tissue. The anterior and posterior vagus was seen along the esophagus at the GE junction.  These were both preserved throughout the entire case. At this time the phrenoesophageal fat pad was dissected away from the esophagus. There was a small-sized type 1 hiatal hernia seen. I mobilized the esophagus cephalad approximately 4-5 cm, clearing away the surrounding tissue. The anterior hernia sac was dissected away from the stomach and esophagus.  At this time we turned our attention to the greater curvature the stomach and the omentum was mobilized using the robotic vessel sealer. This was taken up to the greater curvature to the hiatus. This mobilized the entire greater curvature to allow mobilization and the wrap. I then proceeded to bring the greater curvature the stomach posterior to the esophagus,  and a shoeshine technique was used to evaluate the mobilization of the greater curvature.   At this time I proceeded to close the hiatus using interrupted 0 Ethibonds x 3. This brought together the hiatal closure without undue stricture to the esophagus.   A piece of Gore Bio A hiatal mesh was placed over the hiatal closure and sutured to the  crus using 0 Ethibonds sutures x 3.  At this time the greater curvature was brought around the esophagus and sutured using 0 silk sutures interrupted fashion approximately 1 cm apart x3 on each side of the esophagus in a Toupet fashion. A left collar stitch was then used to gastropexy the stomach from the wrap to the diaphragm just lateral to the left crus as.  A second collar stitch was placed from the wrap to the right crus.  The wrap lay loose with no strangulation of the esophagus.  At this time the robot was undocked. The liver trocar was removed. At this time insufflation was evacuated. Skin was reapproximated for Monocryl subcuticular fashion. The skin was then dressed with Dermabond. The patient tolerated the procedure well and was taken to the recovery room in stable condition.    PLAN OF CARE: Admit for overnight observation  PATIENT DISPOSITION:  PACU - hemodynamically stable.   Delay start of Pharmacological VTE agent (>24hrs) due to surgical blood loss or risk of bleeding: not applicable

## 2022-09-13 NOTE — Anesthesia Procedure Notes (Signed)
Procedure Name: Intubation Date/Time: 09/13/2022 7:36 AM  Performed by: Inda Coke, CRNAPre-anesthesia Checklist: Patient identified, Emergency Drugs available, Suction available, Timeout performed and Patient being monitored Patient Re-evaluated:Patient Re-evaluated prior to induction Oxygen Delivery Method: Circle system utilized Preoxygenation: Pre-oxygenation with 100% oxygen Induction Type: IV induction Ventilation: Mask ventilation without difficulty Laryngoscope Size: Mac and 4 Grade View: Grade II Tube type: Oral Tube size: 7.5 mm Airway Equipment and Method: Stylet Placement Confirmation: ETT inserted through vocal cords under direct vision, positive ETCO2, CO2 detector and breath sounds checked- equal and bilateral Secured at: 23 cm Tube secured with: Tape Dental Injury: Teeth and Oropharynx as per pre-operative assessment

## 2022-09-13 NOTE — H&P (Signed)
Subjective   Chief Complaint: New Consultation (Hiatal hernia)   History of Present Illness: Bruce Woods is a 61 y.o. male who is seen today as an office consultation at the request of Dr. Bryan Lemma for evaluation of New Consultation (Hiatal hernia) .   Patient is a 61 year old male comes in secondary to reflux. Patient states this been going on for approximate year. He previously had undergone an endoscopy. He also recently underwent a CT scan in July. This did show a small 2 cm hiatal hernia. Patient did recently undergo endoscopy secondary to reflux retching, and burping. Patient was found to have a 4 cm hiatal hernia. This appeared to be a slider.  Patient had no previous abdominal surgery.  Patient states that this time he is mainly able to take liquids.  Review of Systems: A complete review of systems was obtained from the patient. I have reviewed this information and discussed as appropriate with the patient. See HPI as well for other ROS.  Review of Systems  Constitutional: Negative.  HENT: Negative.  Eyes: Negative.  Respiratory: Negative.  Cardiovascular: Negative.  Gastrointestinal: Positive for abdominal pain, heartburn, nausea and vomiting.  Genitourinary: Negative.  Musculoskeletal: Negative.  Skin: Negative.  Neurological: Negative.  Endo/Heme/Allergies: Negative.  Psychiatric/Behavioral: Negative.   Medical History: Past Medical History:  Diagnosis Date  Anxiety  GERD (gastroesophageal reflux disease)  Hypertension  Sleep apnea   There is no problem list on file for this patient.  History reviewed. No pertinent surgical history.   Allergies  Allergen Reactions  Morphine Itching and Other (See Comments)  Resp distress, rash, tachycardia  Bupropion Hives and Other (See Comments)  Celecoxib Nausea and Other (See Comments)  Elevated LFT's   Current Outpatient Medications on File Prior to Visit  Medication Sig Dispense Refill  BIKTARVY 50-200-25  mg tablet Take 1 tablet by mouth once daily  citalopram (CELEXA) 20 MG tablet Take 20 mg by mouth once daily  losartan (COZAAR) 25 MG tablet Take 1 tablet by mouth once daily  methocarbamoL (ROBAXIN) 500 MG tablet 500 mg as needed.  ondansetron (ZOFRAN-ODT) 4 MG disintegrating tablet Take 4 mg by mouth every 8 (eight) hours as needed  pantoprazole (PROTONIX) 40 MG DR tablet Take 1 tablet by mouth 2 (two) times daily  sucralfate (CARAFATE) 1 gram tablet  tadalafiL (CIALIS) 5 MG tablet Take 1 tablet by mouth once daily  testosterone cypionate (DEPO-TESTOSTERONE) 200 mg/mL injection Inject into the muscle  zolpidem (AMBIEN) 10 mg tablet Take 1 tablet by mouth at bedtime as needed   No current facility-administered medications on file prior to visit.   Family History  Problem Relation Age of Onset  High blood pressure (Hypertension) Mother    Social History   Tobacco Use  Smoking Status Former  Types: Cigarettes  Quit date: 1985  Years since quitting: 38.7  Smokeless Tobacco Never    Social History   Socioeconomic History  Marital status: Married  Tobacco Use  Smoking status: Former  Types: Cigarettes  Quit date: 1985  Years since quitting: 38.7  Smokeless tobacco: Never  Vaping Use  Vaping Use: Never used  Substance and Sexual Activity  Alcohol use: Yes  Drug use: Never   Objective:   Vitals:  08/05/22 0834  BP: 118/78  Pulse: 58  Temp: 37 C (98.6 F)  SpO2: 99%  Weight: 87.2 kg (192 lb 3.2 oz)  Height: 172.7 cm ('5\' 8"'$ )   Body mass index is 29.22 kg/m.  Physical Exam Constitutional:  General:  He is not in acute distress. Appearance: Normal appearance.  HENT:  Head: Normocephalic.  Nose: No rhinorrhea.  Mouth/Throat:  Mouth: Mucous membranes are moist.  Pharynx: Oropharynx is clear.  Eyes:  General: No scleral icterus. Pupils: Pupils are equal, round, and reactive to light.  Cardiovascular:  Rate and Rhythm: Normal rate.  Pulses: Normal pulses.   Pulmonary:  Effort: Pulmonary effort is normal. No respiratory distress.  Breath sounds: No stridor. No wheezing.  Abdominal:  General: Abdomen is flat. There is no distension.  Tenderness: There is no abdominal tenderness. There is no guarding or rebound.  Musculoskeletal:  General: Normal range of motion.  Cervical back: Normal range of motion and neck supple.  Skin: General: Skin is warm and dry.  Capillary Refill: Capillary refill takes less than 2 seconds.  Coloration: Skin is not jaundiced.  Neurological:  General: No focal deficit present.  Mental Status: He is alert and oriented to person, place, and time. Mental status is at baseline.  Psychiatric:  Mood and Affect: Mood normal.  Thought Content: Thought content normal.  Judgment: Judgment normal.    Assessment and Plan:  Diagnoses and all orders for this visit:  Hiatal hernia   Bruce Woods is a 61 y.o. male   We will proceed to the OR for a robotic hiatal hernia repair with mesh and fundoplication All risks and benefits were discussed with the patient, to generally include infection, bleeding, damage to surrounding structures, possible pneumothorax, and recurrence. Alternatives were offered and described. All questions were answered and the patient voiced understanding of the procedure and wishes to proceed at this point.

## 2022-09-13 NOTE — Transfer of Care (Signed)
Immediate Anesthesia Transfer of Care Note  Patient: Bruce Woods  Procedure(s) Performed: XI ROBOTIC ASSISTED HIATAL HERNIA REPAIR WITH FUNDOPLICATION WITH MESH (Abdomen)  Patient Location: PACU  Anesthesia Type:General  Level of Consciousness: awake and drowsy  Airway & Oxygen Therapy: Patient Spontanous Breathing and Patient connected to face mask oxygen  Post-op Assessment: Report given to RN, Post -op Vital signs reviewed and stable, and Patient moving all extremities X 4  Post vital signs: Reviewed and stable  Last Vitals:  Vitals Value Taken Time  BP 153/88 09/13/22 0950  Temp 36.6 C 09/13/22 0950  Pulse 79 09/13/22 0952  Resp 13 09/13/22 0952  SpO2 95 % 09/13/22 0952  Vitals shown include unvalidated device data.  Last Pain:  Vitals:   09/13/22 0614  PainSc: 2       Patients Stated Pain Goal: 2 (09/47/09 6283)  Complications: No notable events documented.

## 2022-09-13 NOTE — Discharge Instructions (Signed)
EATING AFTER YOUR ESOPHAGEAL SURGERY (Stomach Fundoplication, Hiatal Hernia repair, Achalasia surgery, etc)  ######################################################################  EAT Start with a pureed / full liquid diet (see below) Gradually transition to a high fiber diet with a fiber supplement over the next month after discharge.    WALK Walk an hour a day.  Control your pain to do that.    CONTROL PAIN Control pain so that you can walk, sleep, tolerate sneezing/coughing, go up/down stairs.  HAVE A BOWEL MOVEMENT DAILY Keep your bowels regular to avoid problems.  OK to try a laxative to override constipation.  OK to use an antidairrheal to slow down diarrhea.  Call if not better after 2 tries  CALL IF YOU HAVE PROBLEMS/CONCERNS Call if you are still struggling despite following these instructions. Call if you have concerns not answered by these instructions  ######################################################################   After your esophageal surgery, expect some sticking with swallowing over the next 1-2 months.    If food sticks when you eat, it is called "dysphagia".  This is due to swelling around your esophagus at the wrap & hiatal diaphragm repair.  It will gradually ease off over the next few months.  To help you through this temporary phase, we start you out on a pureed (blenderized) diet.  Your first meal in the hospital was thin liquids.  You should have been given a pureed diet by the time you left the hospital.  We ask patients to stay on a pureed diet for the first 2-3 weeks to avoid anything getting "stuck" near your recent surgery.  Don't be alarmed if your ability to swallow doesn't progress according to this plan.  Everyone is different and some diets can advance more or less quickly.    It is often helpful to crush your medications or split them as they can sometimes stick, especially the first week or so.   Some BASIC RULES to follow  are:  Maintain an upright position whenever eating or drinking.  Take small bites - just a teaspoon size bite at a time.  Eat slowly.  It may also help to eat only one food at a time.  Consider nibbling through smaller, more frequent meals & avoid the urge to eat BIG meals  Do not push through feelings of fullness, nausea, or bloatedness  Do not mix solid foods and liquids in the same mouthful  Try not to "wash foods down" with large gulps of liquids.  Avoid carbonated (bubbly/fizzy) drinks.    Avoid foods that make you feel gassy or bloated.  Start with bland foods first.  Wait on trying greasy, fried, or spicy meals until you are tolerating more bland solids well.  Understand that it will be hard to burp and belch at first.  This gradually improves with time.  Expect to be more gassy/flatulent/bloated initially.  Walking will help your body manage it better.  Consider using medications for bloating that contain simethicone such as  Maalox or Gas-X   Consider crushing her medications, especially smaller pills.  The ability to swallow pills should get easier after a few weeks  Eat in a relaxed atmosphere & minimize distractions.  Avoid talking while eating.    Do not use straws.  Following each meal, sit in an upright position (90 degree angle) for 60 to 90 minutes.  Going for a short walk can help as well  If food does stick, don't panic.  Try to relax and let the food pass on its own.    Sipping WARM LIQUID such as strong hot black tea can also help slide it down.   Be gradual in changes & use common sense:  -If you easily tolerating a certain "level" of foods, advance to the next level gradually -If you are having trouble swallowing a particular food, then avoid it.   -If food is sticking when you advance your diet, go back to thinner previous diet (the lower LEVEL) for 1-2 days.  LEVEL 1 = PUREED DIET  Do for the first 2 WEEKS AFTER SURGERY  -Foods in this group are  pureed or blenderized to a smooth, mashed potato-like consistency.  -If necessary, the pureed foods can keep their shape with the addition of a thickening agent.   -Meat should be pureed to a smooth, pasty consistency.  Hot broth or gravy may be added to the pureed meat, approximately 1 oz. of liquid per 3 oz. serving of meat. -CAUTION:  If any foods do not puree into a smooth consistency, swallowing will be more difficult.  (For example, nuts or seeds sometimes do not blend well.)  Hot Foods Cold Foods  Pureed scrambled eggs and cheese Pureed cottage cheese  Baby cereals Thickened juices and nectars  Thinned cooked cereals (no lumps) Thickened milk or eggnog  Pureed French toast or pancakes Ensure  Mashed potatoes Ice cream  Pureed parsley, au gratin, scalloped potatoes, candied sweet potatoes Fruit or Italian ice, sherbet  Pureed buttered or alfredo noodles Plain yogurt  Pureed vegetables (no corn or peas) Instant breakfast  Pureed soups and creamed soups Smooth pudding, mousse, custard  Pureed scalloped apples Whipped gelatin  Gravies Sugar, syrup, honey, jelly  Sauces, cheese, tomato, barbecue, white, creamed Cream  Any baby food Creamer  Alcohol in moderation (not beer or champagne) Margarine  Coffee or tea Mayonnaise   Ketchup, mustard   Apple sauce   SAMPLE MENU:  PUREED DIET Breakfast Lunch Dinner   Orange juice, 1/2 cup  Cream of wheat, 1/2 cup  Pineapple juice, 1/2 cup  Pureed turkey, barley soup, 3/4 cup  Pureed Hawaiian chicken, 3 oz   Scrambled eggs, mashed or blended with cheese, 1/2 cup  Tea or coffee, 1 cup   Whole milk, 1 cup   Non-dairy creamer, 2 Tbsp.  Mashed potatoes, 1/2 cup  Pureed cooled broccoli, 1/2 cup  Apple sauce, 1/2 cup  Coffee or tea  Mashed potatoes, 1/2 cup  Pureed spinach, 1/2 cup  Frozen yogurt, 1/2 cup  Tea or coffee      LEVEL 2 = SOFT DIET  After your first 2 weeks, you can advance to a soft diet.   Keep on this  diet until everything goes down easily.  Hot Foods Cold Foods  White fish Cottage cheese  Stuffed fish Junior baby fruit  Baby food meals Semi thickened juices  Minced soft cooked, scrambled, poached eggs nectars  Souffle & omelets Ripe mashed bananas  Cooked cereals Canned fruit, pineapple sauce, milk  potatoes Milkshake  Buttered or Alfredo noodles Custard  Cooked cooled vegetable Puddings, including tapioca  Sherbet Yogurt  Vegetable soup or alphabet soup Fruit ice, Italian ice  Gravies Whipped gelatin  Sugar, syrup, honey, jelly Junior baby desserts  Sauces:  Cheese, creamed, barbecue, tomato, white Cream  Coffee or tea Margarine   SAMPLE MENU:  LEVEL 2 Breakfast Lunch Dinner   Orange juice, 1/2 cup  Oatmeal, 1/2 cup  Scrambled eggs with cheese, 1/2 cup  Decaffeinated tea, 1 cup  Whole milk, 1 cup    Non-dairy creamer, 2 Tbsp  Pineapple juice, 1/2 cup  Minced beef, 3 oz  Gravy, 2 Tbsp  Mashed potatoes, 1/2 cup  Minced fresh broccoli, 1/2 cup  Applesauce, 1/2 cup  Coffee, 1 cup  Turkey, barley soup, 3/4 cup  Minced Hawaiian chicken, 3 oz  Mashed potatoes, 1/2 cup  Cooked spinach, 1/2 cup  Frozen yogurt, 1/2 cup  Non-dairy creamer, 2 Tbsp      LEVEL 3 = CHOPPED DIET  -After all the foods in level 2 (soft diet) are passing through well you should advance up to more chopped foods.  -It is still important to cut these foods into small pieces and eat slowly.  Hot Foods Cold Foods  Poultry Cottage cheese  Chopped Swedish meatballs Yogurt  Meat salads (ground or flaked meat) Milk  Flaked fish (tuna) Milkshakes  Poached or scrambled eggs Soft, cold, dry cereal  Souffles and omelets Fruit juices or nectars  Cooked cereals Chopped canned fruit  Chopped French toast or pancakes Canned fruit cocktail  Noodles or pasta (no rice) Pudding, mousse, custard  Cooked vegetables (no frozen peas, corn, or mixed vegetables) Green salad  Canned small sweet peas  Ice cream  Creamed soup or vegetable soup Fruit ice, Italian ice  Pureed vegetable soup or alphabet soup Non-dairy creamer  Ground scalloped apples Margarine  Gravies Mayonnaise  Sauces:  Cheese, creamed, barbecue, tomato, white Ketchup  Coffee or tea Mustard   SAMPLE MENU:  LEVEL 3 Breakfast Lunch Dinner   Orange juice, 1/2 cup  Oatmeal, 1/2 cup  Scrambled eggs with cheese, 1/2 cup  Decaffeinated tea, 1 cup  Whole milk, 1 cup  Non-dairy creamer, 2 Tbsp  Ketchup, 1 Tbsp  Margarine, 1 tsp  Salt, 1/4 tsp  Sugar, 2 tsp  Pineapple juice, 1/2 cup  Ground beef, 3 oz  Gravy, 2 Tbsp  Mashed potatoes, 1/2 cup  Cooked spinach, 1/2 cup  Applesauce, 1/2 cup  Decaffeinated coffee  Whole milk  Non-dairy creamer, 2 Tbsp  Margarine, 1 tsp  Salt, 1/4 tsp  Pureed turkey, barley soup, 3/4 cup  Barbecue chicken, 3 oz  Mashed potatoes, 1/2 cup  Ground fresh broccoli, 1/2 cup  Frozen yogurt, 1/2 cup  Decaffeinated tea, 1 cup  Non-dairy creamer, 2 Tbsp  Margarine, 1 tsp  Salt, 1/4 tsp  Sugar, 1 tsp    LEVEL 4:  REGULAR FOODS  -Foods in this group are soft, moist, regularly textured foods.   -This level includes meat and breads, which tend to be the hardest things to swallow.   -Eat very slowly, chew well and continue to avoid carbonated drinks. -most people are at this level in 4-6 weeks  Hot Foods Cold Foods  Baked fish or skinned Soft cheeses - cottage cheese  Souffles and omelets Cream cheese  Eggs Yogurt  Stuffed shells Milk  Spaghetti with meat sauce Milkshakes  Cooked cereal Cold dry cereals (no nuts, dried fruit, coconut)  French toast or pancakes Crackers  Buttered toast Fruit juices or nectars  Noodles or pasta (no rice) Canned fruit  Potatoes (all types) Ripe bananas  Soft, cooked vegetables (no corn, lima, or baked beans) Peeled, ripe, fresh fruit  Creamed soups or vegetable soup Cakes (no nuts, dried fruit, coconut)  Canned chicken  noodle soup Plain doughnuts  Gravies Ice cream  Bacon dressing Pudding, mousse, custard  Sauces:  Cheese, creamed, barbecue, tomato, white Fruit ice, Italian ice, sherbet  Decaffeinated tea or coffee Whipped gelatin  Pork chops Regular gelatin     Canned fruited gelatin molds   Sugar, syrup, honey, jam, jelly   Cream   Non-dairy   Margarine   Oil   Mayonnaise   Ketchup   Mustard   TROUBLESHOOTING IRREGULAR BOWELS  1) Avoid extremes of bowel movements (no bad constipation/diarrhea)  2) Miralax 17gm mixed in 8oz. water or juice-daily. May use BID as needed.  3) Gas-x,Phazyme, etc. as needed for gas & bloating.  4) Soft,bland diet. No spicy,greasy,fried foods.  5) Prilosec over-the-counter as needed  6) May hold gluten/wheat products from diet to see if symptoms improve.  7) May try probiotics (Align, Activa, etc) to help calm the bowels down  7) If symptoms become worse call back immediately.    If you have any questions please call our office at [facility]: [phone].  

## 2022-09-13 NOTE — Anesthesia Postprocedure Evaluation (Signed)
Anesthesia Post Note  Patient: Bruce Woods  Procedure(s) Performed: XI ROBOTIC ASSISTED HIATAL HERNIA REPAIR WITH FUNDOPLICATION WITH MESH (Abdomen)     Patient location during evaluation: PACU Anesthesia Type: General Level of consciousness: sedated Pain management: pain level controlled Vital Signs Assessment: post-procedure vital signs reviewed and stable Respiratory status: spontaneous breathing and respiratory function stable Cardiovascular status: stable Postop Assessment: no apparent nausea or vomiting Anesthetic complications: no   No notable events documented.  Last Vitals:  Vitals:   09/13/22 1030 09/13/22 1045  BP: (!) 162/109 (!) 149/95  Pulse: 84 74  Resp: 15 16  Temp:    SpO2: 94% 95%    Last Pain:  Vitals:   09/13/22 1015  PainSc: [place]

## 2022-09-14 ENCOUNTER — Other Ambulatory Visit (HOSPITAL_BASED_OUTPATIENT_CLINIC_OR_DEPARTMENT_OTHER): Payer: Self-pay

## 2022-09-14 ENCOUNTER — Observation Stay (HOSPITAL_COMMUNITY): Payer: 59

## 2022-09-14 ENCOUNTER — Encounter (HOSPITAL_COMMUNITY): Payer: Self-pay | Admitting: General Surgery

## 2022-09-14 DIAGNOSIS — Z79899 Other long term (current) drug therapy: Secondary | ICD-10-CM | POA: Diagnosis not present

## 2022-09-14 DIAGNOSIS — Z87891 Personal history of nicotine dependence: Secondary | ICD-10-CM | POA: Diagnosis not present

## 2022-09-14 DIAGNOSIS — Z85828 Personal history of other malignant neoplasm of skin: Secondary | ICD-10-CM | POA: Diagnosis not present

## 2022-09-14 DIAGNOSIS — I10 Essential (primary) hypertension: Secondary | ICD-10-CM | POA: Diagnosis not present

## 2022-09-14 DIAGNOSIS — K449 Diaphragmatic hernia without obstruction or gangrene: Secondary | ICD-10-CM | POA: Diagnosis not present

## 2022-09-14 DIAGNOSIS — K219 Gastro-esophageal reflux disease without esophagitis: Secondary | ICD-10-CM | POA: Diagnosis not present

## 2022-09-14 DIAGNOSIS — B20 Human immunodeficiency virus [HIV] disease: Secondary | ICD-10-CM | POA: Diagnosis not present

## 2022-09-14 MED ORDER — IOHEXOL 300 MG/ML  SOLN
125.0000 mL | Freq: Once | INTRAMUSCULAR | Status: AC | PRN
  Administered 2022-09-14: 125 mL via ORAL

## 2022-09-14 MED ORDER — TRAMADOL HCL 50 MG PO TABS
50.0000 mg | ORAL_TABLET | Freq: Four times a day (QID) | ORAL | 0 refills | Status: DC | PRN
  Filled 2022-09-14: qty 20, 5d supply, fill #0

## 2022-09-14 MED ORDER — METHOCARBAMOL 750 MG PO TABS
750.0000 mg | ORAL_TABLET | Freq: Four times a day (QID) | ORAL | 0 refills | Status: DC
  Filled 2022-09-14: qty 30, 8d supply, fill #0

## 2022-09-14 MED ORDER — IOHEXOL 300 MG/ML  SOLN: 100.0000 mL | Freq: Once | INTRAMUSCULAR | Status: DC | PRN

## 2022-09-14 MED ORDER — BOOST / RESOURCE BREEZE PO LIQD CUSTOM: 1.0000 | Freq: Two times a day (BID) | ORAL | Status: DC

## 2022-09-14 NOTE — Progress Notes (Signed)
Patient not in room at time of morning rounds.

## 2022-09-14 NOTE — Discharge Summary (Signed)
Patient ID: Bruce Woods [id number] 61 y.o. 1961/07/16  09/13/2022  Discharge date and time: 09/14/2022  Admitting Physician: [place]  Discharge Physician: [place]  Admission Diagnoses: Hiatal hernia [K44.9] S/P Nissen fundoplication (without gastrostomy tube) procedure [Z98.890] Patient Active Problem List   Diagnosis Date Noted   Hiatal hernia 09/13/2022   S/P Nissen fundoplication (without gastrostomy tube) procedure 09/13/2022   Vitamin D deficiency 06/21/2022   Angio-edema suspected of small bowel question due to amlodipine 06/02/2022   Basal cell carcinoma 12/16/2021   Hypertension, essential 10/27/2020   Anxiety 10/14/2020   Elevated blood pressure reading 10/14/2020   Medial epicondylitis of elbow, right 04/20/2020   Dyspnea on exertion 08/01/2017   PCP NOTES >>>>>>>>>>>>>>>>>>>>>>>>> 01/01/2016   Insomnia 11/19/2013   Annual physical exam 10/01/2013   Fatigue, low vit D 08/19/2011   Erectile dysfunction 08/19/2011   Depression 01/03/2007   Human immunodeficiency virus (HIV) disease ([place]) 08/18/2006   SARCOIDOSIS 08/18/2006     Discharge Diagnoses:  Patient Active Problem List   Diagnosis Date Noted   Hiatal hernia 09/13/2022   S/P Nissen fundoplication (without gastrostomy tube) procedure 09/13/2022   Vitamin D deficiency 06/21/2022   Angio-edema suspected of small bowel question due to amlodipine 06/02/2022   Basal cell carcinoma 12/16/2021   Hypertension, essential 10/27/2020   Anxiety 10/14/2020   Elevated blood pressure reading 10/14/2020   Medial epicondylitis of elbow, right 04/20/2020   Dyspnea on exertion 08/01/2017   PCP NOTES >>>>>>>>>>>>>>>>>>>>>>>>> 01/01/2016   Insomnia 11/19/2013   Annual physical exam 10/01/2013   Fatigue, low vit D 08/19/2011   Erectile dysfunction 08/19/2011   Depression 01/03/2007   Human immunodeficiency virus (HIV) disease ([place]) 08/18/2006   SARCOIDOSIS 08/18/2006    Operations:  Procedure(s): XI ROBOTIC ASSISTED HIATAL HERNIA REPAIR WITH FUNDOPLICATION WITH MESH  Admission Condition: good  Discharged Condition: good  Indication for Admission: hiatal hernia  Hospital Course: Bruce Woods underwent robotic hiatal hernia repair with Toupet fundoplication yesterday.  Esophagram was normal this morning and he feels okay going home.  Liquids going down okay and has a plan to slowly advance diet consistency at home.  Consults: None  Significant Diagnostic Studies: None  Treatments: surgery: as above  Disposition: Home  Patient Instructions:  Allergies as of 09/14/2022       Reactions   Amlodipine Besylate Swelling   Suspected angioedema of bowel   Morphine Anaphylaxis, Other (See Comments)   Resp distress, rash, tachycardia   Vibramycin [doxycycline] Nausea And Vomiting   Celebrex [celecoxib] Other (See Comments)   Elevated LFT's   Cepacol [benzocaine-menthol]    Wellbutrin [bupropion] Hives        Medication List     TAKE these medications    anastrozole 1 MG tablet Commonly known as: ARIMIDEX Take 1 tablet by mouth once weekly What changed: Another medication with the same name was changed. Make sure you understand how and when to take each.   anastrozole 1 MG tablet Commonly known as: ARIMIDEX Take 1 tablet (1 mg total) by mouth once a week. What changed: when to take this   Biktarvy 50-200-25 MG Tabs tablet Generic drug: bictegravir-emtricitabine-tenofovir AF TAKE 1 TABLET BY MOUTH DAILY.   citalopram 20 MG tablet Commonly known as: CELEXA Take 1 tablet (20 mg total) by mouth daily.   esomeprazole 40 MG capsule Commonly known as: NexIUM Take 1 capsule (40 mg total) by mouth 2 (two) times daily before a meal.   Fluarix Quadrivalent 0.5 ML injection  Generic drug: influenza vac split quadrivalent PF Inject into the muscle.   losartan 25 MG tablet Commonly known as: COZAAR Take 1 tablet (25 mg total) by mouth daily.    methocarbamol 500 MG tablet Commonly known as: ROBAXIN Take 500 mg by mouth daily as needed (hip pain.). What changed: Another medication with the same name was added. Make sure you understand how and when to take each.   methocarbamol 750 MG tablet Commonly known as: Robaxin-750 Take 1 tablet (750 mg total) by mouth 4 (four) times daily. What changed: You were already taking a medication with the same name, and this prescription was added. Make sure you understand how and when to take each.   ondansetron 4 MG disintegrating tablet Commonly known as: ZOFRAN-ODT Take 1 tablet (4 mg total) by mouth every 8 (eight) hours as needed for nausea or vomiting. What changed: when to take this   pantoprazole 40 MG tablet Commonly known as: PROTONIX Take 1 tablet (40 mg total) by mouth 2 (two) times daily. What changed: when to take this   PRESCRIPTION MEDICATION Take 1 tablet by mouth daily as needed (pain.). Patient takes his wife FLEXERIL (dose unknown) daily if needed for pain.   tadalafil 5 MG tablet Commonly known as: CIALIS Take 1 tablet by mouth once daily   testosterone cypionate 200 MG/ML injection Commonly known as: DEPOTESTOSTERONE CYPIONATE Inject 0.5 mLs (100 mg total) into the muscle once a week. What changed: when to take this   traMADol 50 MG tablet Commonly known as: Ultram Take 1 tablet (50 mg total) by mouth every 6 (six) hours as needed.   zolpidem 10 MG tablet Commonly known as: AMBIEN TAKE 1 TABLET BY MOUTH EVERY NIGHT AT BEDTIME AS NEEDED FOR SLEEP What changed:  how much to take reasons to take this        Activity:  No heavy lifting for 4 weeks Diet:  post fundoplication diet Wound Care: keep wound clean and dry  Follow-up:  With Dr. Rosendo Gros  Signed: Nickola Major Liviya Santini [facility], [place]   09/14/2022, 12:36 PM

## 2022-09-14 NOTE — Progress Notes (Signed)
Initial Nutrition Assessment  DOCUMENTATION CODES:   Not applicable  INTERVENTION:   Diet education Advance diet as medically appropriate per MD Boost Breeze po BID, each supplement provides 250 kcal and 9 grams of protein  NUTRITION DIAGNOSIS:   Inadequate oral intake related to vomiting as evidenced by per patient/family report.  GOAL:   Patient will meet greater than or equal to 90% of their needs  MONITOR:   PO intake, Diet advancement, Supplement acceptance  REASON FOR ASSESSMENT:   Consult Diet education  ASSESSMENT:   61 y.o. male presented for surgery of hernia repair. PMH includes HTN, basal cell carcinoma.   Pt reports that he was unable to keep anything down. States that he could take about 2 bites of something and then would throw up. Endorses a 20# weight loss over this period as well. Pt expressed that he wants to gain back some weight. Pt did not provided UBW. Suspect current weight is stated, unable to properly assess weight loss in chart.   Provided pt with a handout about pureed diet post fundoplication. Reviewed handout with pt, ways to increase calories and protein, and foods to avoid. Pt expressed good understanding. RD answered all pt questions. Encouraged pt to use nutritional supplements to meet calories and protein needs.   Medications and labs reviewed.  Diet Order:   Diet Order             Diet clear liquid Room service appropriate? Yes; Fluid consistency: Thin  Diet effective now                   EDUCATION NEEDS:   Education needs have been addressed  Skin:  Skin Assessment: Reviewed RN Assessment  Last BM:  11/13  Height:   Ht Readings from Last 1 Encounters:  09/13/22 '5\' 8"'$  (1.727 m)    Weight:   Wt Readings from Last 1 Encounters:  09/13/22 81.6 kg    Ideal Body Weight:  70 kg  BMI:  Body mass index is 27.37 kg/m.  Estimated Nutritional Needs:   Kcal:  2050-2250  Protein:  100-115 grams  Fluid:  >/= 2  L    Hermina Barters RD, LDN Clinical Dietitian See [facility] for contact information.

## 2022-09-16 ENCOUNTER — Encounter: Payer: Self-pay | Admitting: Internal Medicine

## 2022-09-19 ENCOUNTER — Other Ambulatory Visit (HOSPITAL_BASED_OUTPATIENT_CLINIC_OR_DEPARTMENT_OTHER): Payer: Self-pay

## 2022-09-19 DIAGNOSIS — R1013 Epigastric pain: Secondary | ICD-10-CM | POA: Diagnosis not present

## 2022-09-19 DIAGNOSIS — K2100 Gastro-esophageal reflux disease with esophagitis, without bleeding: Secondary | ICD-10-CM | POA: Diagnosis not present

## 2022-09-19 DIAGNOSIS — R110 Nausea: Secondary | ICD-10-CM | POA: Diagnosis not present

## 2022-09-19 DIAGNOSIS — R6881 Early satiety: Secondary | ICD-10-CM | POA: Diagnosis not present

## 2022-09-19 DIAGNOSIS — R109 Unspecified abdominal pain: Secondary | ICD-10-CM | POA: Diagnosis not present

## 2022-09-20 ENCOUNTER — Other Ambulatory Visit: Payer: Self-pay | Admitting: Internal Medicine

## 2022-09-20 DIAGNOSIS — B20 Human immunodeficiency virus [HIV] disease: Secondary | ICD-10-CM

## 2022-09-21 ENCOUNTER — Other Ambulatory Visit: Payer: Self-pay

## 2022-09-21 ENCOUNTER — Other Ambulatory Visit: Payer: 59

## 2022-09-21 ENCOUNTER — Other Ambulatory Visit (HOSPITAL_BASED_OUTPATIENT_CLINIC_OR_DEPARTMENT_OTHER): Payer: Self-pay

## 2022-09-21 DIAGNOSIS — B20 Human immunodeficiency virus [HIV] disease: Secondary | ICD-10-CM | POA: Diagnosis not present

## 2022-09-23 LAB — COMPREHENSIVE METABOLIC PANEL WITH GFR
AG Ratio: 1.6 (calc) (ref 1.0–2.5)
ALT: 12 U/L (ref 9–46)
AST: 15 U/L (ref 10–35)
Albumin: 4.4 g/dL (ref 3.6–5.1)
Alkaline phosphatase (APISO): 77 U/L (ref 35–144)
BUN/Creatinine Ratio: 7 (calc) (ref 6–22)
BUN: 11 mg/dL (ref 7–25)
CO2: 30 mmol/L (ref 20–32)
Calcium: 9.5 mg/dL (ref 8.6–10.3)
Chloride: 100 mmol/L (ref 98–110)
Creat: 1.53 mg/dL — ABNORMAL HIGH (ref 0.70–1.35)
Globulin: 2.8 g/dL (ref 1.9–3.7)
Glucose, Bld: 142 mg/dL — ABNORMAL HIGH (ref 65–99)
Potassium: 3.9 mmol/L (ref 3.5–5.3)
Sodium: 140 mmol/L (ref 135–146)
Total Bilirubin: 1.3 mg/dL — ABNORMAL HIGH (ref 0.2–1.2)
Total Protein: 7.2 g/dL (ref 6.1–8.1)

## 2022-09-23 LAB — CBC
HCT: 51.2 % — ABNORMAL HIGH (ref 38.5–50.0)
Hemoglobin: 17.6 g/dL — ABNORMAL HIGH (ref 13.2–17.1)
MCH: 29.5 pg (ref 27.0–33.0)
MCHC: 34.4 g/dL (ref 32.0–36.0)
MCV: 85.8 fL (ref 80.0–100.0)
MPV: 10.6 fL (ref 7.5–12.5)
Platelets: 184 10*3/uL (ref 140–400)
RBC: 5.97 10*6/uL — ABNORMAL HIGH (ref 4.20–5.80)
RDW: 16.3 % — ABNORMAL HIGH (ref 11.0–15.0)
WBC: 6.4 10*3/uL (ref 3.8–10.8)

## 2022-09-23 LAB — T-HELPER CELLS (CD4) COUNT (NOT AT ARMC)
Absolute CD4: 430 {cells}/uL — ABNORMAL LOW (ref 490–1740)
CD4 T Helper %: 24 % — ABNORMAL LOW (ref 30–61)
Total lymphocyte count: 1792 {cells}/uL (ref 850–3900)

## 2022-09-23 LAB — HIV-1 RNA QUANT-NO REFLEX-BLD
HIV 1 RNA Quant: <20 Copies/mL — ABNORMAL HIGH
HIV-1 RNA Quant, Log: <1.3 Log cps/mL — ABNORMAL HIGH

## 2022-09-23 LAB — RPR: RPR Ser Ql: NONREACTIVE

## 2022-09-23 LAB — LIPID PANEL
Cholesterol: 171 mg/dL
HDL: 26 mg/dL — ABNORMAL LOW
LDL Cholesterol (Calc): 117 mg/dL — ABNORMAL HIGH
Non-HDL Cholesterol (Calc): 145 mg/dL — ABNORMAL HIGH
Total CHOL/HDL Ratio: 6.6 (calc) — ABNORMAL HIGH
Triglycerides: 167 mg/dL — ABNORMAL HIGH

## 2022-10-03 ENCOUNTER — Other Ambulatory Visit (HOSPITAL_BASED_OUTPATIENT_CLINIC_OR_DEPARTMENT_OTHER): Payer: Self-pay

## 2022-10-03 ENCOUNTER — Other Ambulatory Visit (HOSPITAL_COMMUNITY): Payer: Self-pay

## 2022-10-05 ENCOUNTER — Encounter: Payer: Self-pay | Admitting: Internal Medicine

## 2022-10-05 ENCOUNTER — Other Ambulatory Visit (HOSPITAL_COMMUNITY): Payer: Self-pay

## 2022-10-05 ENCOUNTER — Ambulatory Visit (INDEPENDENT_AMBULATORY_CARE_PROVIDER_SITE_OTHER): Payer: 59

## 2022-10-05 ENCOUNTER — Other Ambulatory Visit: Payer: Self-pay

## 2022-10-05 ENCOUNTER — Other Ambulatory Visit (HOSPITAL_BASED_OUTPATIENT_CLINIC_OR_DEPARTMENT_OTHER): Payer: Self-pay

## 2022-10-05 ENCOUNTER — Ambulatory Visit: Payer: 59 | Admitting: Internal Medicine

## 2022-10-05 VITALS — BP 152/92 | HR 63 | Temp 97.8°F | Resp 16 | Ht 68.0 in | Wt 181.3 lb

## 2022-10-05 DIAGNOSIS — Z23 Encounter for immunization: Secondary | ICD-10-CM

## 2022-10-05 DIAGNOSIS — D751 Secondary polycythemia: Secondary | ICD-10-CM | POA: Diagnosis not present

## 2022-10-05 DIAGNOSIS — R7989 Other specified abnormal findings of blood chemistry: Secondary | ICD-10-CM | POA: Insufficient documentation

## 2022-10-05 DIAGNOSIS — I10 Essential (primary) hypertension: Secondary | ICD-10-CM

## 2022-10-05 DIAGNOSIS — B20 Human immunodeficiency virus [HIV] disease: Secondary | ICD-10-CM

## 2022-10-05 MED ORDER — BIKTARVY 50-200-25 MG PO TABS
1.0000 | ORAL_TABLET | Freq: Every day | ORAL | 11 refills | Status: DC
  Filled 2022-10-05: qty 30, 30d supply, fill #0
  Filled 2022-11-01: qty 30, 30d supply, fill #1
  Filled 2022-11-25: qty 30, 30d supply, fill #2
  Filled 2022-12-28: qty 30, 30d supply, fill #3
  Filled 2023-01-31: qty 30, 30d supply, fill #4
  Filled 2023-03-01: qty 30, 30d supply, fill #5
  Filled 2023-04-04 (×2): qty 30, 30d supply, fill #6
  Filled 2023-05-05 – 2023-05-08 (×2): qty 30, 30d supply, fill #7
  Filled 2023-05-30: qty 30, 30d supply, fill #8
  Filled 2023-06-28: qty 30, 30d supply, fill #9
  Filled 2023-07-28: qty 30, 30d supply, fill #10
  Filled 2023-08-18: qty 30, 30d supply, fill #11

## 2022-10-05 MED ORDER — BIKTARVY 50-200-25 MG PO TABS
1.0000 | ORAL_TABLET | Freq: Every day | ORAL | 11 refills | Status: DC
  Filled 2022-10-05: qty 30, 30d supply, fill #0

## 2022-10-05 NOTE — Assessment & Plan Note (Signed)
His hemoglobin and hematocrit have been above normal for several years.  He may need further evaluation for new onset polycythemia.

## 2022-10-05 NOTE — Assessment & Plan Note (Signed)
His blood pressure remains above goal.

## 2022-10-05 NOTE — Progress Notes (Signed)
Patient Active Problem List   Diagnosis Date Noted   Depression 01/03/2007    Priority: High   Human immunodeficiency virus (HIV) disease ([place]) 08/18/2006    Priority: High   Elevated serum creatinine 10/05/2022   Polycythemia 10/05/2022   Hiatal hernia 09/13/2022   S/P Nissen fundoplication (without gastrostomy tube) procedure 09/13/2022   Vitamin D deficiency 06/21/2022   Angio-edema suspected of small bowel question due to amlodipine 06/02/2022   Basal cell carcinoma 12/16/2021   Hypertension, essential 10/27/2020   Anxiety 10/14/2020   Medial epicondylitis of elbow, right 04/20/2020   Dyspnea on exertion 08/01/2017   PCP NOTES >>>>>>>>>>>>>>>>>>>>>>>>> 01/01/2016   Insomnia 11/19/2013   Annual physical exam 10/01/2013   Fatigue, low vit D 08/19/2011   Erectile dysfunction 08/19/2011   SARCOIDOSIS 08/18/2006    Patient's Medications  New Prescriptions   No medications on file  Previous Medications   ANASTROZOLE (ARIMIDEX) 1 MG TABLET    Take 1 tablet by mouth once weekly   ANASTROZOLE (ARIMIDEX) 1 MG TABLET    Take 1 tablet (1 mg total) by mouth once a week.   CITALOPRAM (CELEXA) 20 MG TABLET    Take 1 tablet (20 mg total) by mouth daily.   ESOMEPRAZOLE (NEXIUM) 40 MG CAPSULE    Take 1 capsule (40 mg total) by mouth 2 (two) times daily before a meal.   INFLUENZA VAC SPLIT QUADRIVALENT PF (FLUARIX QUADRIVALENT) 0.5 ML INJECTION    Inject into the muscle.   LOSARTAN (COZAAR) 25 MG TABLET    Take 1 tablet (25 mg total) by mouth daily.   METHOCARBAMOL (ROBAXIN) 500 MG TABLET    Take 500 mg by mouth daily as needed (hip pain.).   METHOCARBAMOL (ROBAXIN-750) 750 MG TABLET    Take 1 tablet (750 mg total) by mouth 4 (four) times daily.   ONDANSETRON (ZOFRAN-ODT) 4 MG DISINTEGRATING TABLET    Take 1 tablet (4 mg total) by mouth every 8 (eight) hours as needed for nausea or vomiting.   PANTOPRAZOLE (PROTONIX) 40 MG TABLET    Take 1 tablet (40 mg total) by mouth 2 (two)  times daily.   PRESCRIPTION MEDICATION    Take 1 tablet by mouth daily as needed (pain.). Patient takes his wife FLEXERIL (dose unknown) daily if needed for pain.   TADALAFIL (CIALIS) 5 MG TABLET    Take 1 tablet by mouth once daily   TESTOSTERONE CYPIONATE (DEPOTESTOSTERONE CYPIONATE) 200 MG/ML INJECTION    Inject 0.5 mLs (100 mg total) into the muscle once a week.   TRAMADOL (ULTRAM) 50 MG TABLET    Take 1 tablet (50 mg total) by mouth every 6 (six) hours as needed.   ZOLPIDEM (AMBIEN) 10 MG TABLET    TAKE 1 TABLET BY MOUTH EVERY NIGHT AT BEDTIME AS NEEDED FOR SLEEP  Modified Medications   Modified Medication Previous Medication   BICTEGRAVIR-EMTRICITABINE-TENOFOVIR AF (BIKTARVY) 50-200-25 MG TABS TABLET bictegravir-emtricitabine-tenofovir AF (BIKTARVY) 50-200-25 MG TABS tablet      Take 1 tablet by mouth daily.    TAKE 1 TABLET BY MOUTH DAILY.  Discontinued Medications   No medications on file    Subjective: Bruce Woods is in for his routine HIV follow-up visit.  He underwent repair of his hiatal hernia 3 weeks ago.  He has lost some weight and is very weak but slowly improving.  He is still on a liquid diet.  He has had to break his Biktarvy in half  to get it down each day but has not missed any doses.  He has had his annual influenza vaccine but has not had an updated COVID-vaccine yet.  He is back to getting regular exercise walking each day.  He has not returned to work yet.  Review of Systems: Review of Systems  Constitutional:  Positive for weight loss. Negative for fever.  Gastrointestinal:  Positive for heartburn.    Past Medical History:  Diagnosis Date   Complication of anesthesia 07/28/2022   oxygen desaturations to 60's during EGD, bronchospasm after scope removed, reported being told he has suspected undiagnosed OSA   COVID-19 07/2021   DEPRESSION    Gastritis and gastroduodenitis    GERD (gastroesophageal reflux disease)    History of hiatal hernia    HIV DISEASE     Hypertension    IBS (irritable bowel syndrome)    Sarcoidosis 2000   question of   Ulnar nerve compression, right     Social History   Tobacco Use   Smoking status: Former    Packs/day: 1.00    Years: 2.00    Total pack years: 2.00    Types: Cigarettes    Quit date: 11/01/1983    Years since quitting: 38.9   Smokeless tobacco: Never  Vaping Use   Vaping Use: Never used  Substance Use Topics   Alcohol use: Yes    Alcohol/week: 1.0 standard drink of alcohol    Types: 1 Standard drinks or equivalent per week    Comment: socially    Drug use: No    Family History  Problem Relation Age of Onset   Hyperlipidemia Mother    Hypertension Mother    Cancer Mother        glioblastoma   Sudden death Neg Hx    Heart attack Neg Hx    Diabetes Neg Hx    Colon cancer Neg Hx    Prostate cancer Neg Hx     Allergies  Allergen Reactions   Amlodipine Besylate Swelling    Suspected angioedema of bowel   Cepacol [Benzocaine-Menthol] Anaphylaxis   Morphine Anaphylaxis and Other (See Comments)    Resp distress, rash, tachycardia   Vibramycin [Doxycycline] Nausea And Vomiting   Celebrex [Celecoxib] Other (See Comments)    Elevated LFT's   Wellbutrin [Bupropion] Hives    Health Maintenance  Topic Date Due   Zoster Vaccines- Shingrix (1 of 2) Never done   COVID-19 Vaccine (5 - 2023-24 season) 07/01/2022   COLONOSCOPY (Pts 45-43yr Insurance coverage will need to be confirmed)  01/18/2023 (Originally 02/06/2004)   DTaP/Tdap/Td (5 - Td or Tdap) 01/03/2027   INFLUENZA VACCINE  Completed   Hepatitis C Screening  Completed   HIV Screening  Completed   HPV VACCINES  Aged Out    Objective:  Vitals:   10/05/22 0852  BP: (!) 152/92  Pulse: 63  Resp: 16  Temp: 97.8 F (36.6 C)  TempSrc: Oral  SpO2: 99%  Weight: 181 lb 4.8 oz (82.2 kg)  Height: '5\' 8"'$  (1.727 m)   Body mass index is 27.57 kg/m.  Physical Exam Constitutional:      Comments: He is talkative and in good spirits as  usual.  Cardiovascular:     Rate and Rhythm: Normal rate.  Pulmonary:     Effort: Pulmonary effort is normal.  Psychiatric:        Mood and Affect: Mood normal.     Lab Results Lab Results  Component Value Date  WBC 6.4 09/21/2022   HGB 17.6 (H) 09/21/2022   HCT 51.2 (H) 09/21/2022   MCV 85.8 09/21/2022   PLT 184 09/21/2022    Lab Results  Component Value Date   CREATININE 1.53 (H) 09/21/2022   BUN 11 09/21/2022   NA 140 09/21/2022   K 3.9 09/21/2022   CL 100 09/21/2022   CO2 30 09/21/2022    Lab Results  Component Value Date   ALT 12 09/21/2022   AST 15 09/21/2022   ALKPHOS 72 06/02/2022   BILITOT 1.3 (H) 09/21/2022    Lab Results  Component Value Date   CHOL 171 09/21/2022   HDL 26 (L) 09/21/2022   LDLCALC 117 (H) 09/21/2022   LDLDIRECT 115.0 01/07/2019   TRIG 167 (H) 09/21/2022   CHOLHDL 6.6 (H) 09/21/2022   Lab Results  Component Value Date   LABRPR NON-REACTIVE 09/21/2022   HIV 1 RNA Quant (Copies/mL)  Date Value  09/21/2022 <20 (H)  10/05/2021 Not Detected  09/29/2020 32 (H)   CD4 T Cell Abs (/uL)  Date Value  10/05/2021 415  09/29/2020 435  10/08/2019 418     Problem List Items Addressed This Visit       High   Human immunodeficiency virus (HIV) disease ([place]) - Primary    His infection remains under excellent, long-term control.  He will continue Biktarvy and follow-up after lab work in 1 year.  He received an updated COVID-vaccine here today.      Relevant Orders   CBC   T-helper cells (CD4) count (not at [facility])   Comprehensive metabolic panel   Lipid panel   RPR   HIV-1 RNA quant-no reflex-bld     Unprioritized   Hypertension, essential    His blood pressure remains above goal.      Elevated serum creatinine    His creatinine has been elevated intermittently for several years.  I suspect this is related to his inadequately controlled hypertension rather than HIV nephropathy.  He is scheduled to follow-up with his PCP early  next year.      Polycythemia    His hemoglobin and hematocrit have been above normal for several years.  He may need further evaluation for new onset polycythemia.         Michel Bickers, MD [facility] [phone] pager   [phone] cell 10/05/2022, 9:25 AM

## 2022-10-05 NOTE — Assessment & Plan Note (Signed)
His creatinine has been elevated intermittently for several years.  I suspect this is related to his inadequately controlled hypertension rather than HIV nephropathy.  He is scheduled to follow-up with his PCP early next year.

## 2022-10-05 NOTE — Assessment & Plan Note (Signed)
His infection remains under excellent, long-term control.  He will continue Biktarvy and follow-up after lab work in 1 year.  He received an updated COVID-vaccine here today.

## 2022-10-07 ENCOUNTER — Other Ambulatory Visit: Payer: Self-pay

## 2022-10-08 ENCOUNTER — Other Ambulatory Visit (HOSPITAL_BASED_OUTPATIENT_CLINIC_OR_DEPARTMENT_OTHER): Payer: Self-pay | Admitting: Family Medicine

## 2022-10-08 DIAGNOSIS — I10 Essential (primary) hypertension: Secondary | ICD-10-CM

## 2022-10-10 ENCOUNTER — Other Ambulatory Visit (HOSPITAL_BASED_OUTPATIENT_CLINIC_OR_DEPARTMENT_OTHER): Payer: Self-pay

## 2022-10-10 MED ORDER — LOSARTAN POTASSIUM 25 MG PO TABS
25.0000 mg | ORAL_TABLET | Freq: Every day | ORAL | 1 refills | Status: DC
  Filled 2022-10-10: qty 30, 30d supply, fill #0
  Filled 2022-11-09: qty 30, 30d supply, fill #1

## 2022-10-12 ENCOUNTER — Other Ambulatory Visit (HOSPITAL_BASED_OUTPATIENT_CLINIC_OR_DEPARTMENT_OTHER): Payer: Self-pay

## 2022-10-19 ENCOUNTER — Other Ambulatory Visit (HOSPITAL_BASED_OUTPATIENT_CLINIC_OR_DEPARTMENT_OTHER): Payer: Self-pay

## 2022-11-01 ENCOUNTER — Other Ambulatory Visit (HOSPITAL_COMMUNITY): Payer: Self-pay

## 2022-11-02 ENCOUNTER — Other Ambulatory Visit: Payer: Self-pay

## 2022-11-02 ENCOUNTER — Other Ambulatory Visit (HOSPITAL_COMMUNITY): Payer: Self-pay

## 2022-11-03 ENCOUNTER — Other Ambulatory Visit (HOSPITAL_COMMUNITY): Payer: Self-pay

## 2022-11-09 ENCOUNTER — Other Ambulatory Visit (HOSPITAL_BASED_OUTPATIENT_CLINIC_OR_DEPARTMENT_OTHER): Payer: Self-pay

## 2022-11-21 ENCOUNTER — Other Ambulatory Visit (HOSPITAL_BASED_OUTPATIENT_CLINIC_OR_DEPARTMENT_OTHER): Payer: Self-pay

## 2022-11-22 DIAGNOSIS — L570 Actinic keratosis: Secondary | ICD-10-CM | POA: Diagnosis not present

## 2022-11-22 DIAGNOSIS — C44319 Basal cell carcinoma of skin of other parts of face: Secondary | ICD-10-CM | POA: Diagnosis not present

## 2022-11-22 DIAGNOSIS — D485 Neoplasm of uncertain behavior of skin: Secondary | ICD-10-CM | POA: Diagnosis not present

## 2022-11-22 DIAGNOSIS — B079 Viral wart, unspecified: Secondary | ICD-10-CM | POA: Diagnosis not present

## 2022-11-22 DIAGNOSIS — L820 Inflamed seborrheic keratosis: Secondary | ICD-10-CM | POA: Diagnosis not present

## 2022-11-25 ENCOUNTER — Other Ambulatory Visit (HOSPITAL_COMMUNITY): Payer: Self-pay

## 2022-12-01 ENCOUNTER — Other Ambulatory Visit (HOSPITAL_COMMUNITY): Payer: Self-pay

## 2022-12-01 ENCOUNTER — Other Ambulatory Visit: Payer: Self-pay

## 2022-12-08 ENCOUNTER — Ambulatory Visit: Payer: Commercial Managed Care - PPO | Admitting: Psychologist

## 2022-12-08 DIAGNOSIS — F411 Generalized anxiety disorder: Secondary | ICD-10-CM | POA: Diagnosis not present

## 2022-12-08 NOTE — Progress Notes (Signed)
                Johaan Ryser, PsyD 
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
## 2022-12-09 ENCOUNTER — Other Ambulatory Visit (HOSPITAL_BASED_OUTPATIENT_CLINIC_OR_DEPARTMENT_OTHER): Payer: Self-pay | Admitting: Family Medicine

## 2022-12-09 DIAGNOSIS — I10 Essential (primary) hypertension: Secondary | ICD-10-CM

## 2022-12-12 ENCOUNTER — Other Ambulatory Visit (HOSPITAL_BASED_OUTPATIENT_CLINIC_OR_DEPARTMENT_OTHER): Payer: Self-pay

## 2022-12-12 MED ORDER — LOSARTAN POTASSIUM 25 MG PO TABS
25.0000 mg | ORAL_TABLET | Freq: Every day | ORAL | 1 refills | Status: DC
  Filled 2022-12-12: qty 30, 30d supply, fill #0
  Filled 2023-01-09: qty 30, 30d supply, fill #1

## 2022-12-14 ENCOUNTER — Ambulatory Visit (HOSPITAL_BASED_OUTPATIENT_CLINIC_OR_DEPARTMENT_OTHER): Payer: Commercial Managed Care - PPO

## 2022-12-14 DIAGNOSIS — E559 Vitamin D deficiency, unspecified: Secondary | ICD-10-CM | POA: Diagnosis not present

## 2022-12-14 DIAGNOSIS — Z125 Encounter for screening for malignant neoplasm of prostate: Secondary | ICD-10-CM | POA: Diagnosis not present

## 2022-12-14 DIAGNOSIS — Z0000 Encounter for general adult medical examination without abnormal findings: Secondary | ICD-10-CM

## 2022-12-15 LAB — CBC WITH DIFFERENTIAL/PLATELET
Basophils Absolute: 0 10*3/uL (ref 0.0–0.2)
Basos: 0 %
EOS (ABSOLUTE): 0.1 10*3/uL (ref 0.0–0.4)
Eos: 1 %
Hematocrit: 49.3 % (ref 37.5–51.0)
Hemoglobin: 17 g/dL (ref 13.0–17.7)
Immature Grans (Abs): 0 10*3/uL (ref 0.0–0.1)
Immature Granulocytes: 0 %
Lymphocytes Absolute: 2.2 10*3/uL (ref 0.7–3.1)
Lymphs: 34 %
MCH: 30 pg (ref 26.6–33.0)
MCHC: 34.5 g/dL (ref 31.5–35.7)
MCV: 87 fL (ref 79–97)
Monocytes Absolute: 0.5 10*3/uL (ref 0.1–0.9)
Monocytes: 7 %
Neutrophils Absolute: 3.8 10*3/uL (ref 1.4–7.0)
Neutrophils: 58 %
Platelets: 177 10*3/uL (ref 150–450)
RBC: 5.67 x10E6/uL (ref 4.14–5.80)
RDW: 13.8 % (ref 11.6–15.4)
WBC: 6.6 10*3/uL (ref 3.4–10.8)

## 2022-12-15 LAB — COMPREHENSIVE METABOLIC PANEL
ALT: 10 IU/L (ref 0–44)
AST: 17 IU/L (ref 0–40)
Albumin/Globulin Ratio: 1.9 (ref 1.2–2.2)
Albumin: 4.4 g/dL (ref 3.9–4.9)
Alkaline Phosphatase: 83 IU/L (ref 44–121)
BUN/Creatinine Ratio: 9 — ABNORMAL LOW (ref 10–24)
BUN: 12 mg/dL (ref 8–27)
Bilirubin Total: 0.7 mg/dL (ref 0.0–1.2)
CO2: 23 mmol/L (ref 20–29)
Calcium: 9.2 mg/dL (ref 8.6–10.2)
Chloride: 102 mmol/L (ref 96–106)
Creatinine, Ser: 1.29 mg/dL — ABNORMAL HIGH (ref 0.76–1.27)
Globulin, Total: 2.3 g/dL (ref 1.5–4.5)
Glucose: 100 mg/dL — ABNORMAL HIGH (ref 70–99)
Potassium: 3.9 mmol/L (ref 3.5–5.2)
Sodium: 142 mmol/L (ref 134–144)
Total Protein: 6.7 g/dL (ref 6.0–8.5)
eGFR: 62 mL/min/{1.73_m2} (ref >59)

## 2022-12-15 LAB — LIPID PANEL
Chol/HDL Ratio: 5.6 ratio — ABNORMAL HIGH (ref 0.0–5.0)
Cholesterol, Total: 162 mg/dL (ref 100–199)
HDL: 29 mg/dL — ABNORMAL LOW (ref >39)
LDL Chol Calc (NIH): 109 mg/dL — ABNORMAL HIGH (ref 0–99)
Triglycerides: 131 mg/dL (ref 0–149)
VLDL Cholesterol Cal: 24 mg/dL (ref 5–40)

## 2022-12-15 LAB — PSA TOTAL (REFLEX TO FREE): Prostate Specific Ag, Serum: 0.9 ng/mL (ref 0.0–4.0)

## 2022-12-15 LAB — HEMOGLOBIN A1C
Est. average glucose Bld gHb Est-mCnc: 108 mg/dL
Hgb A1c MFr Bld: 5.4 % (ref 4.8–5.6)

## 2022-12-15 LAB — TSH RFX ON ABNORMAL TO FREE T4: TSH: 2.73 u[IU]/mL (ref 0.450–4.500)

## 2022-12-15 LAB — VITAMIN D 25 HYDROXY (VIT D DEFICIENCY, FRACTURES): Vit D, 25-Hydroxy: 24.8 ng/mL — ABNORMAL LOW (ref 30.0–100.0)

## 2022-12-19 ENCOUNTER — Other Ambulatory Visit (HOSPITAL_BASED_OUTPATIENT_CLINIC_OR_DEPARTMENT_OTHER): Payer: Self-pay

## 2022-12-22 ENCOUNTER — Encounter (HOSPITAL_BASED_OUTPATIENT_CLINIC_OR_DEPARTMENT_OTHER): Payer: Commercial Managed Care - PPO | Admitting: Family Medicine

## 2022-12-22 DIAGNOSIS — C44319 Basal cell carcinoma of skin of other parts of face: Secondary | ICD-10-CM | POA: Diagnosis not present

## 2022-12-28 ENCOUNTER — Other Ambulatory Visit (HOSPITAL_COMMUNITY): Payer: Self-pay

## 2022-12-30 DIAGNOSIS — G459 Transient cerebral ischemic attack, unspecified: Secondary | ICD-10-CM

## 2022-12-30 HISTORY — DX: Transient cerebral ischemic attack, unspecified: G45.9

## 2023-01-02 ENCOUNTER — Other Ambulatory Visit (HOSPITAL_BASED_OUTPATIENT_CLINIC_OR_DEPARTMENT_OTHER): Payer: Self-pay

## 2023-01-02 ENCOUNTER — Emergency Department (HOSPITAL_BASED_OUTPATIENT_CLINIC_OR_DEPARTMENT_OTHER): Payer: Commercial Managed Care - PPO

## 2023-01-02 ENCOUNTER — Other Ambulatory Visit: Payer: Self-pay

## 2023-01-02 ENCOUNTER — Encounter (HOSPITAL_BASED_OUTPATIENT_CLINIC_OR_DEPARTMENT_OTHER): Payer: Self-pay | Admitting: Urology

## 2023-01-02 ENCOUNTER — Ambulatory Visit: Payer: Commercial Managed Care - PPO | Admitting: Psychologist

## 2023-01-02 ENCOUNTER — Observation Stay (HOSPITAL_BASED_OUTPATIENT_CLINIC_OR_DEPARTMENT_OTHER)
Admission: EM | Admit: 2023-01-02 | Discharge: 2023-01-03 | Disposition: A | Discharge status: Home / Self Care | Payer: Commercial Managed Care - PPO | Attending: Internal Medicine | Admitting: Internal Medicine

## 2023-01-02 DIAGNOSIS — Z87891 Personal history of nicotine dependence: Secondary | ICD-10-CM | POA: Diagnosis not present

## 2023-01-02 DIAGNOSIS — Z8616 Personal history of COVID-19: Secondary | ICD-10-CM | POA: Diagnosis not present

## 2023-01-02 DIAGNOSIS — Z21 Asymptomatic human immunodeficiency virus [HIV] infection status: Secondary | ICD-10-CM | POA: Diagnosis not present

## 2023-01-02 DIAGNOSIS — G459 Transient cerebral ischemic attack, unspecified: Principal | ICD-10-CM | POA: Diagnosis present

## 2023-01-02 DIAGNOSIS — R410 Disorientation, unspecified: Secondary | ICD-10-CM | POA: Diagnosis not present

## 2023-01-02 DIAGNOSIS — R55 Syncope and collapse: Secondary | ICD-10-CM | POA: Diagnosis not present

## 2023-01-02 DIAGNOSIS — R4182 Altered mental status, unspecified: Secondary | ICD-10-CM | POA: Diagnosis not present

## 2023-01-02 DIAGNOSIS — E876 Hypokalemia: Secondary | ICD-10-CM | POA: Diagnosis not present

## 2023-01-02 DIAGNOSIS — H532 Diplopia: Secondary | ICD-10-CM | POA: Diagnosis present

## 2023-01-02 DIAGNOSIS — Z79899 Other long term (current) drug therapy: Secondary | ICD-10-CM | POA: Diagnosis not present

## 2023-01-02 DIAGNOSIS — B20 Human immunodeficiency virus [HIV] disease: Secondary | ICD-10-CM | POA: Diagnosis present

## 2023-01-02 DIAGNOSIS — I10 Essential (primary) hypertension: Secondary | ICD-10-CM | POA: Diagnosis not present

## 2023-01-02 DIAGNOSIS — E291 Testicular hypofunction: Secondary | ICD-10-CM | POA: Diagnosis not present

## 2023-01-02 LAB — URINALYSIS, ROUTINE W REFLEX MICROSCOPIC
Bilirubin Urine: NEGATIVE
Glucose, UA: NEGATIVE mg/dL
Hgb urine dipstick: NEGATIVE
Ketones, ur: NEGATIVE mg/dL
Leukocytes,Ua: NEGATIVE
Nitrite: NEGATIVE
Protein, ur: NEGATIVE mg/dL
Specific Gravity, Urine: 1.02 (ref 1.005–1.030)
pH: 6.5 (ref 5.0–8.0)

## 2023-01-02 LAB — BASIC METABOLIC PANEL
Anion gap: 7 (ref 5–15)
BUN: 10 mg/dL (ref 8–23)
CO2: 27 mmol/L (ref 22–32)
Calcium: 8.7 mg/dL — ABNORMAL LOW (ref 8.9–10.3)
Chloride: 103 mmol/L (ref 98–111)
Creatinine, Ser: 1.38 mg/dL — ABNORMAL HIGH (ref 0.61–1.24)
GFR, Estimated: 57 mL/min — ABNORMAL LOW (ref >60)
Glucose, Bld: 82 mg/dL (ref 70–99)
Potassium: 3.3 mmol/L — ABNORMAL LOW (ref 3.5–5.1)
Sodium: 137 mmol/L (ref 135–145)

## 2023-01-02 LAB — CBC
HCT: 49.8 % (ref 39.0–52.0)
Hemoglobin: 16.8 g/dL (ref 13.0–17.0)
MCH: 29.8 pg (ref 26.0–34.0)
MCHC: 33.7 g/dL (ref 30.0–36.0)
MCV: 88.5 fL (ref 80.0–100.0)
Platelets: 189 10*3/uL (ref 150–400)
RBC: 5.63 MIL/uL (ref 4.22–5.81)
RDW: 14.5 % (ref 11.5–15.5)
WBC: 7.3 10*3/uL (ref 4.0–10.5)
nRBC: 0 % (ref 0.0–0.2)

## 2023-01-02 LAB — PROTIME-INR
INR: 1.1 (ref 0.8–1.2)
Prothrombin Time: 14.3 seconds (ref 11.4–15.2)

## 2023-01-02 LAB — DIFFERENTIAL
Abs Immature Granulocytes: 0.03 10*3/uL (ref 0.00–0.07)
Basophils Absolute: 0 10*3/uL (ref 0.0–0.1)
Basophils Relative: 0 %
Eosinophils Absolute: 0.1 10*3/uL (ref 0.0–0.5)
Eosinophils Relative: 1 %
Immature Granulocytes: 0 %
Lymphocytes Relative: 29 %
Lymphs Abs: 2.2 10*3/uL (ref 0.7–4.0)
Monocytes Absolute: 0.5 10*3/uL (ref 0.1–1.0)
Monocytes Relative: 6 %
Neutro Abs: 4.6 10*3/uL (ref 1.7–7.7)
Neutrophils Relative %: 64 %

## 2023-01-02 LAB — HEPATIC FUNCTION PANEL
ALT: 13 U/L (ref 0–44)
AST: 20 U/L (ref 15–41)
Albumin: 4.3 g/dL (ref 3.5–5.0)
Alkaline Phosphatase: 74 U/L (ref 38–126)
Bilirubin, Direct: 0.1 mg/dL (ref 0.0–0.2)
Indirect Bilirubin: 0.7 mg/dL (ref 0.3–0.9)
Total Bilirubin: 0.8 mg/dL (ref 0.3–1.2)
Total Protein: 7.9 g/dL (ref 6.5–8.1)

## 2023-01-02 LAB — RAPID URINE DRUG SCREEN, HOSP PERFORMED
Amphetamines: NOT DETECTED
Barbiturates: NOT DETECTED
Benzodiazepines: POSITIVE — AB
Cocaine: NOT DETECTED
Opiates: NOT DETECTED
Tetrahydrocannabinol: NOT DETECTED

## 2023-01-02 LAB — TROPONIN I (HIGH SENSITIVITY)
Troponin I (High Sensitivity): 4 ng/L (ref <18)
Troponin I (High Sensitivity): 4 ng/L (ref <18)

## 2023-01-02 LAB — APTT: aPTT: 30 seconds (ref 24–36)

## 2023-01-02 LAB — GLUCOSE, CAPILLARY: Glucose-Capillary: 134 mg/dL — ABNORMAL HIGH (ref 70–99)

## 2023-01-02 MED ORDER — ONDANSETRON HCL 4 MG/2ML IJ SOLN
4.0000 mg | Freq: Once | INTRAMUSCULAR | Status: AC
  Administered 2023-01-02: 4 mg via INTRAVENOUS

## 2023-01-02 MED ORDER — CLOPIDOGREL BISULFATE 300 MG PO TABS
300.0000 mg | ORAL_TABLET | Freq: Once | ORAL | Status: AC
  Administered 2023-01-02: 300 mg via ORAL
  Filled 2023-01-02: qty 1

## 2023-01-02 MED ORDER — ASPIRIN 81 MG PO CHEW
81.0000 mg | CHEWABLE_TABLET | Freq: Once | ORAL | Status: AC
  Administered 2023-01-02: 81 mg via ORAL
  Filled 2023-01-02: qty 1

## 2023-01-02 MED ORDER — TENECTEPLASE FOR STROKE
PACK | INTRAVENOUS | Status: AC
  Filled 2023-01-02: qty 10

## 2023-01-02 MED ORDER — POTASSIUM CHLORIDE CRYS ER 20 MEQ PO TBCR
40.0000 meq | EXTENDED_RELEASE_TABLET | Freq: Once | ORAL | Status: AC
  Administered 2023-01-02: 40 meq via ORAL
  Filled 2023-01-02: qty 2

## 2023-01-02 MED ORDER — LORAZEPAM 1 MG PO TABS
1.0000 mg | ORAL_TABLET | Freq: Once | ORAL | Status: AC
  Administered 2023-01-02: 1 mg via ORAL
  Filled 2023-01-02: qty 1

## 2023-01-02 NOTE — ED Notes (Signed)
Called [facility] for transport @ 2130

## 2023-01-02 NOTE — Progress Notes (Signed)
Code stroke timeline 1958 Code stroke cart activated 2002 Pt left for CT 2010 Pt returned from [facility] paged 2020 Neuro [facility] on camera 2032 No TNK, no CTA needed at this time per Neuro TSM[facility]. TSRN off [place] Tel[facility]roke RN

## 2023-01-02 NOTE — Consult Note (Signed)
[facility] TeleNeurology Consult Services   Patient Name:   Bruce Woods, Bruce Woods Date of Birth:   18-Apr-1961 Identification Number:   MRN - [id number] Date of Service:   01/02/2023 20:11:10  Diagnosis:       G45.9 - Transient cerebral ischemic attack, unspecified  Impression:      Symptoms concerning for posterior circulation TIA. No thrombolysis due to resolved symptoms without any focal disabling neurological deficits however I do recommend initiation of dual antiplatelet therapy if no contraindications, and admission for stroke workup. Cardiac workup as well per the ER including orthostatic vitals. Neuro follow-up recommended.  Our recommendations are outlined below.  Recommendations:        Stroke/Telemetry Floor       Neuro Checks       Bedside Swallow Eval       DVT Prophylaxis       IV Fluids, Normal Saline       Head of Bed 30 Degrees       Euglycemia and Avoid Hyperthermia (PRN Acetaminophen)       Bolus with Clopidogrel 300 mg bolus x1 and initiate dual antiplatelet therapy with Aspirin 81 mg daily and Clopidogrel 75 mg daily       Antihypertensives PRN if Blood pressure is greater than 220/120 or there is a concern for End organ damage/contraindications for permissive HTN. If blood pressure is greater than 220/120 give labetalol PO or IV or Vasotec IV with a goal of 15% reduction in BP during the first 24 hours.       Toxic/metabolic/infx workup per ED including UA, UDS       MRI brain without IV contrast       If no cerebral blood vessel imaging done in ED (such as CTA), recommend MRA head/neck without contrast, or carotid ultrasound if cannot obtain MRA       TSH, A1c, lipid profile       Transthoracic echocardiogram       Continuous telemetry       Physical, occupational, and speech therapies       q4h neuro checks/NIHSS       NPO until bedside swallow       Neurology follow-up  Sign Out:       Discussed with Emergency Department  Provider    ------------------------------------------------------------------------------  Advanced Imaging: Advanced Imaging Deferred because:  Non-disabling symptoms as verified by the patient; no cortical signs so not consistent with LVO   Metrics: Last Known Well: 01/02/2023 19:00:00 [facility] Notification Time: 01/02/2023 20:11:09 Arrival Time: 01/02/2023 19:29:00 Stamp Time: 01/02/2023 20:11:10 Initial Response Time: 01/02/2023 20:14:33 Symptoms: Dizziness, diplopia, nausea vomiting. Initial patient interaction: 01/02/2023 20:24:18 NIHSS Assessment Completed: 01/02/2023 20:30:00 Patient is not a candidate for Thrombolytic. Thrombolytic Medical Decision: 01/02/2023 20:33:08 Patient was not deemed candidate for Thrombolytic because of following reasons: Resolved symptoms (no residual disabling symptoms).  I personally Reviewed the CT Head and it Showed no acute abnormalities  Primary Provider Notified of Diagnostic Impression and Management Plan on: 01/02/2023 20:33:29    ------------------------------------------------------------------------------  History of Present Illness: Patient is a 62 year old Male.  Patient was brought by private transportation with symptoms of Dizziness, diplopia, nausea vomiting. Patient with a documented history of polycythemia, IBS, hypertension, HIV, depression, sarcoid, presents to the hospital for symptoms that started this evening. He was sitting at the table feeling okay and then when he stood up he started feeling dizzy and lightheaded. He started having diplopia. He started having nausea and diaphoresis. By  the time he came to the ER his symptoms have for the most part resolved. He does not have any residual diplopia or any lateralizing symptoms or deficits. Never had the symptoms before. He just feels "wiped out" and tired. His examination is nonfocal, NIH scale of 0, able to ambulate without difficulty.   Past Medical  History: [place] PMH:  See HPI Above  Medications:  No Anticoagulant use  No Antiplatelet use Reviewed EMR for current medications  Allergies:  Reviewed  Social History: Drug Use: No  Family History:  There is no family history of premature cerebrovascular disease pertinent to this consultation  ROS : 14 Points Review of Systems was performed and was negative except mentioned in HPI.  Past Surgical History: There Is No Surgical History Contributory To Today's Visit    Examination: BP(152/88), Pulse(80), Blood Glucose(134) 1A: Level of Consciousness - Alert; keenly responsive + 0 1B: Ask Month and Age - Both Questions Right + 0 1C: Blink Eyes & Squeeze Hands - Performs Both Tasks + 0 2: Test Horizontal Extraocular Movements - Normal + 0 3: Test Visual Fields - No Visual Loss + 0 4: Test Facial Palsy (Use Grimace if Obtunded) - Normal symmetry + 0 5A: Test Left Arm Motor Drift - No Drift for 10 Seconds + 0 5B: Test Right Arm Motor Drift - No Drift for 10 Seconds + 0 6A: Test Left Leg Motor Drift - No Drift for 5 Seconds + 0 6B: Test Right Leg Motor Drift - No Drift for 5 Seconds + 0 7: Test Limb Ataxia (FNF/Heel-Shin) - No Ataxia + 0 8: Test Sensation - Normal; No sensory loss + 0 9: Test Language/Aphasia - Normal; No aphasia + 0 10: Test Dysarthria - Normal + 0 11: Test Extinction/Inattention - No abnormality + 0  NIHSS Score: 0   Pre-Morbid Modified Rankin Scale: 0 Points = No symptoms at all  Spoke with : .  Patient/Family was informed the Neurology Consult would occur via TeleHealth consult by way of interactive audio and video telecommunications and consented to receiving care in this manner.   Patient is being evaluated for possible acute neurologic impairment and high probability of imminent or life-threatening deterioration. I spent total of 35 minutes providing care to this patient, including time for face to face visit via telemedicine, review of medical  records, imaging studies and discussion of findings with providers, the patient and/or family.   Dr Knox Royalty   [facility] For Inpatient follow-up with [facility] physician please call [facility] [phone]. This is not an outpatient service. Post hospital discharge, please contact hospital directly.  Please do not communicate with [facility] physicians via secure chat. If you have any questions, Please contact [facility]. Please call or reconsult our service if there are any clinical or diagnostic changes.

## 2023-01-02 NOTE — ED Notes (Signed)
LKN [phone]

## 2023-01-02 NOTE — ED Notes (Signed)
ED Provider at bedside. 

## 2023-01-02 NOTE — ED Notes (Signed)
CODE STROKE called at 81

## 2023-01-02 NOTE — ED Provider Notes (Signed)
[place] [facility] Provider Note   CSN: [id number] Arrival date & time: 01/02/23  1929     History  Chief Complaint  Patient presents with   Hypertension   Code Stroke    Bruce Woods is a 62 y.o. male.  HPI 62 year old male with a history of hypertension, HIV, depression, presents with acute double vision and dizziness.  Symptoms started around 7 p.m. when he got up from the dinner table.  He noticed double vision which seems to be waxing and waning as well as dizziness and feeling like he is leaning to the left.  No headache.  When he first started having the symptoms he had some transient chest pressure that resolved.  He denies any focal weakness but he feels all over weak.  He took all of his blood pressure meds and previous to this his blood pressures have been normal but his wife showed me paperwork that since 7 PM his blood pressures have consistently been around 170/90.  Home Medications Prior to Admission medications   Medication Sig Start Date End Date Taking? Authorizing Provider  anastrozole (ARIMIDEX) 1 MG tablet Take 1 tablet by mouth once weekly 04/28/22     anastrozole (ARIMIDEX) 1 MG tablet Take 1 tablet (1 mg total) by mouth once a week. Patient taking differently: Take 1 mg by mouth every Friday. 07/07/22     bictegravir-emtricitabine-tenofovir AF (BIKTARVY) 50-200-25 MG TABS tablet Take 1 tablet by mouth daily. 10/05/22   Michel Bickers, MD  citalopram (CELEXA) 20 MG tablet Take 1 tablet (20 mg total) by mouth daily. 02/09/22   Colon Branch, MD  esomeprazole (NEXIUM) 40 MG capsule Take 1 capsule (40 mg total) by mouth 2 (two) times daily before a meal. Patient not taking: Reported on 08/30/2022 08/03/22   Cirigliano, Vito V, DO  influenza vac split quadrivalent PF (FLUARIX QUADRIVALENT) 0.5 ML injection Inject into the muscle. 07/19/22   Carlyle Basques, MD  losartan (COZAAR) 25 MG tablet Take 1 tablet (25 mg total) by mouth  daily. 12/12/22   Chalmers Guest, FNP  methocarbamol (ROBAXIN) 500 MG tablet Take 500 mg by mouth daily as needed (hip pain.).    [provider]  methocarbamol (ROBAXIN-750) 750 MG tablet Take 1 tablet (750 mg total) by mouth 4 (four) times daily. 09/14/22   Stechschulte, Nickola Major, MD  ondansetron (ZOFRAN-ODT) 4 MG disintegrating tablet Take 1 tablet (4 mg total) by mouth every 8 (eight) hours as needed for nausea or vomiting. Patient taking differently: Take 4 mg by mouth in the morning, at noon, and at bedtime. 05/30/22   Isla Pence, MD  pantoprazole (PROTONIX) 40 MG tablet Take 1 tablet (40 mg total) by mouth 2 (two) times daily. Patient not taking: Reported on 10/05/2022 07/18/22   Gatha Mayer, MD  PRESCRIPTION MEDICATION Take 1 tablet by mouth daily as needed (pain.). Patient takes his wife FLEXERIL (dose unknown) daily if needed for pain.    [provider]  tadalafil (CIALIS) 5 MG tablet Take 1 tablet by mouth once daily 03/04/22     testosterone cypionate (DEPOTESTOSTERONE CYPIONATE) 200 MG/ML injection Inject 0.5 mLs (100 mg total) into the muscle once a week. Patient taking differently: Inject 100 mg into the muscle every Friday. 07/07/22     traMADol (ULTRAM) 50 MG tablet Take 1 tablet (50 mg total) by mouth every 6 (six) hours as needed. 09/14/22 09/14/23  Stechschulte, Nickola Major, MD  zolpidem (AMBIEN) 10  MG tablet TAKE 1 TABLET BY MOUTH EVERY NIGHT AT BEDTIME AS NEEDED FOR SLEEP Patient taking differently: Take 10 mg by mouth at bedtime as needed for sleep. 10/26/21 09/13/22  Colon Branch, MD      Allergies    Amlodipine besylate, Cepacol [benzocaine-menthol], Morphine, Vibramycin [doxycycline], Celebrex [celecoxib], and Wellbutrin [bupropion]    Review of Systems   Review of Systems  Eyes:  Positive for visual disturbance.  Gastrointestinal:  Positive for nausea. Negative for vomiting.  Musculoskeletal:  Positive for gait problem.  Neurological:  Positive for  dizziness. Negative for numbness and headaches.    Physical Exam Updated Vital Signs BP (!) 152/88   Pulse 81   Temp 98 F (36.7 C) (Oral)   Resp 12   Ht '5\' 8"'$  (1.727 m)   Wt 81.2 kg   SpO2 98%   BMI 27.22 kg/m  Physical Exam Vitals and nursing note reviewed.  Constitutional:      Appearance: He is well-developed.  HENT:     Head: Normocephalic and atraumatic.  Cardiovascular:     Rate and Rhythm: Normal rate and regular rhythm.     Heart sounds: Normal heart sounds.  Pulmonary:     Effort: Pulmonary effort is normal.     Breath sounds: Normal breath sounds.  Abdominal:     Palpations: Abdomen is soft.     Tenderness: There is no abdominal tenderness.  Skin:    General: Skin is warm and dry.  Neurological:     Mental Status: He is alert.     Comments: CN 3-12 grossly intact. 5/5 strength in all 4 extremities. Grossly normal sensation. Seems to miss on first attempt at finger to nose on right but corrects. Has more trouble on the left finger, missing to his left multiple times. Normal heel to shin. Is able to walk but feels like he's going to fall to the left     ED Results / Procedures / Treatments   Labs (all labs ordered are listed, but only abnormal results are displayed) Labs Reviewed  BASIC METABOLIC PANEL - Abnormal; Notable for the following components:      Result Value   Potassium 3.3 (*)    Creatinine, Ser 1.38 (*)    Calcium 8.7 (*)    GFR, Estimated 57 (*)    All other components within normal limits  RAPID URINE DRUG SCREEN, HOSP PERFORMED - Abnormal; Notable for the following components:   Benzodiazepines POSITIVE (*)    All other components within normal limits  GLUCOSE, CAPILLARY - Abnormal; Notable for the following components:   Glucose-Capillary 134 (*)    All other components within normal limits  CBC  URINALYSIS, ROUTINE W REFLEX MICROSCOPIC  PROTIME-INR  APTT  DIFFERENTIAL  HEPATIC FUNCTION PANEL  ETHANOL  CBG MONITORING, ED   TROPONIN I (HIGH SENSITIVITY)  TROPONIN I (HIGH SENSITIVITY)    EKG None  Radiology DG Chest Port 1 View  Result Date: 01/02/2023 CLINICAL DATA:  Altered mental status EXAM: PORTABLE CHEST 1 VIEW COMPARISON:  05/30/2022 FINDINGS: The heart size and mediastinal contours are within normal limits. Both lungs are clear. The visualized skeletal structures are unremarkable. IMPRESSION: No active disease. Electronically Signed   By: Inez Catalina M.D.   On: 01/02/2023 20:59   CT HEAD CODE STROKE WO CONTRAST  Result Date: 01/02/2023 CLINICAL DATA:  Code stroke. Confusion, unsteady gait, nausea, near syncope EXAM: CT HEAD WITHOUT CONTRAST TECHNIQUE: Contiguous axial images were obtained from the base of  the skull through the vertex without intravenous contrast. RADIATION DOSE REDUCTION: This exam was performed according to the departmental dose-optimization program which includes automated exposure control, adjustment of the mA and/or kV according to patient size and/or use of iterative reconstruction technique. COMPARISON:  None Available. FINDINGS: Brain: No evidence of acute infarction, hemorrhage, mass, mass effect, or midline shift. No hydrocephalus or extra-axial collection. Vascular: No hyperdense vessel. Atherosclerotic calcifications in the intracranial carotid and vertebral arteries. Skull: Negative for fracture or focal lesion. Sinuses/Orbits: Mild mucosal thickening in the maxillary sinuses and ethmoid air cells. No acute finding in the orbits. Other: The mastoid air cells are well aerated. ASPECTS [facility] Stroke Program Early CT Score) - Ganglionic level infarction (caudate, lentiform nuclei, internal capsule, insula, M1-M3 cortex): 7 - Supraganglionic infarction (M4-M6 cortex): 3 Total score (0-10 with 10 being normal): 10 IMPRESSION: 1. No acute intracranial process. 2. ASPECTS is 10. Code stroke imaging results were communicated on 01/02/2023 at 8:15 pm to provider Karolyn Messing via telephone, who  verbally acknowledged these results. Electronically Signed   By: Merilyn Baba M.D.   On: 01/02/2023 20:16    Procedures Procedures    Medications Ordered in ED Medications  tenecteplase (TNKASE) 50 MG injection for Stroke (  Not Given 01/02/23 2153)  ondansetron [facility]) injection 4 mg (4 mg Intravenous Given 01/02/23 2015)  aspirin chewable tablet 81 mg (81 mg Oral Given 01/02/23 2044)  clopidogrel (PLAVIX) tablet 300 mg (300 mg Oral Given 01/02/23 2044)  potassium chloride SA (KLOR-CON M) CR tablet 40 mEq (40 mEq Oral Given 01/02/23 2145)  LORazepam (ATIVAN) tablet 1 mg (1 mg Oral Given 01/02/23 2222)    ED Course/ Medical Decision Making/ A&P Clinical Course as of 01/02/23 2325  Mon Jan 02, 2023  1957 Based on presentation, I'm concerned about a posterior circulation stroke. Code stroke called.  [SG]  2035 Tele neuro recommends 81 mg ASA, 300 mg plavix, admit for TIA (symptoms now resolved besides fatigue). [SG]    Clinical Course User Index [SG] Sherwood Gambler, MD                             Medical Decision Making Amount and/or Complexity of Data Reviewed Labs: ordered.    Details: Normal troponins. Mild hypokalemia Radiology: ordered and independent interpretation performed.    Details: CT head - no head bleed ECG/medicine tests: ordered and independent interpretation performed.    Details: No acute ischemia  Risk OTC drugs. Prescription drug management. Decision regarding hospitalization.   Patient presents with symptoms concern for acute posterior circulation stroke. Code stroke called as above. However, symptoms rapidly improved, and so TNK was held after discussion with tele-neurology. Has remained well. BP has improved. Given ASA and Plavix per neuro recommendations. Will admit for TIA workup.   Of note he had some very brief chest pain when this all for started but his troponins have been negative, I doubt ACS or dissection.        Final Clinical Impression(s) /  ED Diagnoses Final diagnoses:  TIA (transient ischemic attack)    Rx / DC Orders ED Discharge Orders     None         Sherwood Gambler, MD 01/02/23 2325

## 2023-01-02 NOTE — ED Notes (Signed)
Pt ambulated from wheelchair to stretcher and had unsteady gait. Pt reports feeling like he is leaning to the left.

## 2023-01-02 NOTE — Progress Notes (Addendum)
Plan of Care Note for accepted transfer   Patient: Bruce Woods MRN: [id number]   [place]: 01/02/2023  Facility requesting transfer: [facility] Requesting Provider: Sherwood Gambler, MD Reason for transfer: TIA Facility course:  62 year old male with a history of hypertension, HIV, depression, presents with acute double vision and dizziness.  Symptoms started around 7 p.m. when he got up from the dinner table.  He noticed double vision which seems to be waxing and waning as well as dizziness and feeling like he is leaning to the left.  No headache.  When he first started having the symptoms he had some transient chest pressure that resolved.  He denies any focal weakness but he feels all over weak.  He took all of his blood pressure meds and previous to this his blood pressures have been normal but his wife showed me paperwork that since 7 PM his blood pressures have consistently been around 170/90.  When he came to the ER, BP was 162/94 with otherwise normal vital signs.  Labs revealed mild hypokalemia of 3.3 and a creatinine 1.38 slightly above previous levels with close to baseline, with calcium of 8.7.  High-sensitivity troponin was 4.  CBC was within normal.  Coag profile was normal.  Portable chest x-ray showed no acute disease.  EKG showed normal sinus rhythm with a rate of 84 with no acute abnormality.  The patient has a teleneurology consult.  Recommendation was for baby aspirin.  Plan of care: The patient is accepted for admission to Telemetry unit, at [facility]..   The patient will be under the care and supervision of the ED physician until arrival to [facility].  Author: Christel Mormon, MD 01/02/2023  Check [email or web address] for on-call coverage.  Nursing staff, Please call [place] number on Amion as soon as patient's arrival, so appropriate admitting provider can evaluate the pt.

## 2023-01-02 NOTE — ED Triage Notes (Addendum)
Pt states sat down at the table, tonight, stood up quickly from table at 1900, got confused, unsteady gait, nauseated, near syncope  BP [id number] systolic and double vision noted at home  States continued blurry vision, gait unsteady, states urge to gravitate to the left  VS stable at time of triage  A&O x 4

## 2023-01-02 NOTE — ED Notes (Signed)
Pt ambulated in room with tele neurology. Pt had steady gait and denied any dizziness or other issues.

## 2023-01-03 ENCOUNTER — Observation Stay (HOSPITAL_COMMUNITY): Payer: Commercial Managed Care - PPO

## 2023-01-03 ENCOUNTER — Other Ambulatory Visit: Payer: Self-pay

## 2023-01-03 ENCOUNTER — Other Ambulatory Visit (HOSPITAL_COMMUNITY): Payer: Self-pay

## 2023-01-03 ENCOUNTER — Encounter (HOSPITAL_COMMUNITY): Payer: Self-pay | Admitting: Family Medicine

## 2023-01-03 ENCOUNTER — Other Ambulatory Visit (HOSPITAL_BASED_OUTPATIENT_CLINIC_OR_DEPARTMENT_OTHER): Payer: Self-pay

## 2023-01-03 ENCOUNTER — Observation Stay (HOSPITAL_BASED_OUTPATIENT_CLINIC_OR_DEPARTMENT_OTHER): Payer: Commercial Managed Care - PPO

## 2023-01-03 DIAGNOSIS — G459 Transient cerebral ischemic attack, unspecified: Secondary | ICD-10-CM

## 2023-01-03 DIAGNOSIS — E7800 Pure hypercholesterolemia, unspecified: Secondary | ICD-10-CM

## 2023-01-03 DIAGNOSIS — B20 Human immunodeficiency virus [HIV] disease: Secondary | ICD-10-CM | POA: Diagnosis not present

## 2023-01-03 DIAGNOSIS — E876 Hypokalemia: Secondary | ICD-10-CM | POA: Diagnosis not present

## 2023-01-03 DIAGNOSIS — I10 Essential (primary) hypertension: Secondary | ICD-10-CM

## 2023-01-03 LAB — LIPID PANEL
Cholesterol: 160 mg/dL (ref 0–200)
HDL: 29 mg/dL — ABNORMAL LOW (ref >40)
LDL Cholesterol: 112 mg/dL — ABNORMAL HIGH (ref 0–99)
Total CHOL/HDL Ratio: 5.5 RATIO
Triglycerides: 97 mg/dL (ref <150)
VLDL: 19 mg/dL (ref 0–40)

## 2023-01-03 LAB — ECHOCARDIOGRAM COMPLETE BUBBLE STUDY
AR max vel: 3.77 cm2
AV Area VTI: 3.27 cm2
AV Area mean vel: 3.54 cm2
AV Mean grad: 4 mmHg
AV Peak grad: 7 mmHg
AV Vena cont: 0.4 cm
Ao pk vel: 1.32 m/s
Area-P 1/2: 3.06 cm2
S' Lateral: 2.8 cm

## 2023-01-03 LAB — ETHANOL: Alcohol, Ethyl (B): <10 mg/dL (ref <10)

## 2023-01-03 MED ORDER — CITALOPRAM HYDROBROMIDE 10 MG PO TABS
20.0000 mg | ORAL_TABLET | Freq: Every day | ORAL | Status: DC
  Filled 2023-01-03: qty 2

## 2023-01-03 MED ORDER — ASPIRIN 81 MG PO TBEC
81.0000 mg | DELAYED_RELEASE_TABLET | Freq: Every day | ORAL | 12 refills | Status: DC
  Filled 2023-01-03: qty 120, 90d supply, fill #0

## 2023-01-03 MED ORDER — ROSUVASTATIN CALCIUM 20 MG PO TABS
20.0000 mg | ORAL_TABLET | Freq: Every day | ORAL | 2 refills | Status: DC
  Filled 2023-01-03: qty 90, 90d supply, fill #0

## 2023-01-03 MED ORDER — ROSUVASTATIN CALCIUM 20 MG PO TABS
20.0000 mg | ORAL_TABLET | Freq: Every day | ORAL | Status: DC
  Administered 2023-01-03: 20 mg via ORAL
  Filled 2023-01-03: qty 1

## 2023-01-03 MED ORDER — PROCHLORPERAZINE EDISYLATE 10 MG/2ML IJ SOLN: 10.0000 mg | Freq: Four times a day (QID) | INTRAMUSCULAR | Status: DC | PRN

## 2023-01-03 MED ORDER — BICTEGRAVIR-EMTRICITAB-TENOFOV 50-200-25 MG PO TABS
1.0000 | ORAL_TABLET | Freq: Every day | ORAL | Status: DC
  Filled 2023-01-03: qty 1

## 2023-01-03 MED ORDER — HEPARIN SODIUM (PORCINE) 5000 UNIT/ML IJ SOLN
5000.0000 [IU] | Freq: Three times a day (TID) | INTRAMUSCULAR | Status: DC
  Administered 2023-01-03 (×2): 5000 [IU] via SUBCUTANEOUS
  Filled 2023-01-03 (×2): qty 1

## 2023-01-03 MED ORDER — CLOPIDOGREL BISULFATE 75 MG PO TABS
75.0000 mg | ORAL_TABLET | Freq: Every day | ORAL | Status: DC
  Administered 2023-01-03: 75 mg via ORAL
  Filled 2023-01-03: qty 1

## 2023-01-03 MED ORDER — CLOPIDOGREL BISULFATE 75 MG PO TABS
75.0000 mg | ORAL_TABLET | Freq: Every day | ORAL | 0 refills | Status: AC
  Filled 2023-01-03: qty 21, 21d supply, fill #0

## 2023-01-03 MED ORDER — ACETAMINOPHEN 325 MG PO TABS: 650.0000 mg | ORAL_TABLET | Freq: Four times a day (QID) | ORAL | Status: DC | PRN

## 2023-01-03 MED ORDER — STROKE: EARLY STAGES OF RECOVERY BOOK: Freq: Once | Status: DC

## 2023-01-03 MED ORDER — ASPIRIN 81 MG PO TBEC
81.0000 mg | DELAYED_RELEASE_TABLET | Freq: Every day | ORAL | Status: DC
  Administered 2023-01-03: 81 mg via ORAL
  Filled 2023-01-03: qty 1

## 2023-01-03 MED ORDER — MELATONIN 5 MG PO TABS: 10.0000 mg | ORAL_TABLET | Freq: Every evening | ORAL | Status: DC | PRN

## 2023-01-03 MED ORDER — DIAZEPAM 5 MG/ML IJ SOLN
10.0000 mg | Freq: Once | INTRAMUSCULAR | Status: AC | PRN
  Administered 2023-01-03: 10 mg via INTRAVENOUS
  Filled 2023-01-03: qty 2

## 2023-01-03 NOTE — Discharge Summary (Addendum)
[facility]  Physician Discharge Summary   Patient ID: Bruce Woods MRN: [id number] DOB/AGE: 1960-11-19 62 y.o.  Admit date: 01/02/2023 Discharge date:   01/03/2023   PCP: [place], Raymond J, MD  DISCHARGE DIAGNOSES:    TIA (transient ischemic attack)   Human immunodeficiency virus (HIV) disease ([place])   Hypertension, essential   Hypokalemia Hyperlipidemia  RECOMMENDATIONS FOR OUTPATIENT FOLLOW UP: F/u with PCP for BP management. Ambulatory referral sent to neuro Hba1c is pending  Home Health:None  Equipment/Devices:None   CODE STATUS:Full   DISCHARGE CONDITION: fair  Diet recommendation: Heart healthy  INITIAL HISTORY: 62 year old male history of hypertension, erectile dysfunction, HIV, sarcoidosis, anxiety, presented to the ED with sudden onset of blurred vision, gait instability, word finding.  Last known well was approximately 1900 hrs. on 01/02/2023.  Patient states that he ate finished dinner.  He stood up suddenly very dizzy.  Confused.  Was having trouble with word finding.  He felt that he was about to pass out.  His wife started checking his blood pressure at home systolic was above [id number].  His wife drove him to  to [facility].  On the way to the ER, patient noted that the road lines were crisscrossing.  He was seeing doubles of cars.  He Became very nauseated.   On arrival to the ER, code stroke was called.  CT head was performed.  Negative acute findings.  But this time his blurred vision had resolved.  Thrombolytics were not given.   Teleneurology was consulted.   [facility] contacted for admission.   Pt works as an Therapist, sports at [employer]   Consultations: Neurology  Procedures: [place]:   Seen by neurology. They have reviewed all images and feel that this is TIA. Patient is back to baseline. To be started on statin for LDL of 112. Aspirin and Plavix for 3 weeks followed by aspirin alone. Cleared by PT/OT. ECHO does not  show any shunt.  Normal systolic function noted.  Diastolic dysfunction noted.  Results communicated to the patient. Needs to f/u with PCP for BP management.  Continue with Biktarvy for HIV. Given potassium for hypokalemia.  Okay for discharge home today.  PERTINENT LABS:  The results of significant diagnostics from this hospitalization (including imaging, microbiology, ancillary and laboratory) are listed below for reference.     Labs:   Basic Metabolic Panel: Recent Labs  Lab 01/02/23 1947  NA 137  K 3.3*  CL 103  CO2 27  GLUCOSE 82  BUN 10  CREATININE 1.38*  CALCIUM 8.7*   Liver Function Tests: Recent Labs  Lab 01/02/23 1957  AST 20  ALT 13  ALKPHOS 74  BILITOT 0.8  PROT 7.9  ALBUMIN 4.3    CBC: Recent Labs  Lab 01/02/23 1947 01/02/23 1957  WBC 7.3  --   NEUTROABS  --  4.6  HGB 16.8  --   HCT 49.8  --   MCV 88.5  --   PLT 189  --      CBG: Recent Labs  Lab 01/02/23 2002  GLUCAP 134*     IMAGING STUDIES ECHOCARDIOGRAM COMPLETE BUBBLE STUDY  Result Date: 01/03/2023    ECHOCARDIOGRAM REPORT   Patient Name:   Bruce Woods Date of Exam: 01/03/2023 Medical Rec #:  [id number]        Height:       68.0 in Accession #:    [id number]  Weight:       179.0 lb Date of Birth:  Mar 06, 1961         BSA:          1.950 m Patient Age:    65 years         BP:           152/80 mmHg Patient Gender: M                HR:           61 bpm. Exam Location:  Inpatient Procedure: 2D Echo, Cardiac Doppler and Color Doppler Indications:    Stroke I63.9  History:        Patient has prior history of Echocardiogram examinations, most                 recent 06/01/2017. Stroke, Signs/Symptoms:Syncope; Risk                 Factors:Hypertension and Former Smoker.  Sonographer:    Wilkie Aye RVT RCS Referring Phys: [place]  1. Left ventricular ejection fraction, by estimation, is 60 to 65%. The left ventricle has normal function. The left ventricle has no regional  wall motion abnormalities. There is mild asymmetric left ventricular hypertrophy of the basal-septal segment. Left ventricular diastolic parameters are consistent with Grade I diastolic dysfunction (impaired relaxation).  2. Right ventricular systolic function is normal. The right ventricular size is appears dilated in short axis views. Tricuspid regurgitation signal is inadequate for assessing PA pressure.  3. The mitral valve is grossly normal. No evidence of mitral valve regurgitation. No evidence of mitral stenosis.  4. The aortic valve is tricuspid. Aortic valve regurgitation is mild. No aortic stenosis is present.  5. Agitated saline contrast bubble study was negative, with no evidence of any interatrial shunt. FINDINGS  Left Ventricle: Left ventricular ejection fraction, by estimation, is 60 to 65%. The left ventricle has normal function. The left ventricle has no regional wall motion abnormalities. The left ventricular internal cavity size was normal in size. There is  mild asymmetric left ventricular hypertrophy of the basal-septal segment. Left ventricular diastolic parameters are consistent with Grade I diastolic dysfunction (impaired relaxation). Right Ventricle: The right ventricular size is appears dilated in short axis views. No increase in right ventricular wall thickness. Right ventricular systolic function is normal. Tricuspid regurgitation signal is inadequate for assessing PA pressure. Left Atrium: Left atrial size was normal in size. Right Atrium: Right atrial size was normal in size. Pericardium: Trivial pericardial effusion is present. Presence of epicardial fat layer. Mitral Valve: The mitral valve is grossly normal. There is mild thickening of the mitral valve leaflet(s). No evidence of mitral valve regurgitation. No evidence of mitral valve stenosis. Tricuspid Valve: The tricuspid valve is grossly normal. Tricuspid valve regurgitation is trivial. Aortic Valve: The aortic valve is tricuspid.  Aortic valve regurgitation is mild. No aortic stenosis is present. Aortic valve mean gradient measures 4.0 mmHg. Aortic valve peak gradient measures 7.0 mmHg. Aortic valve area, by VTI measures 3.27 cm. Pulmonic Valve: The pulmonic valve was not well visualized. Pulmonic valve regurgitation is trivial. No evidence of pulmonic stenosis. Aorta: The aortic root and ascending aorta are structurally normal, with no evidence of dilitation. IAS/Shunts: No atrial level shunt detected by color flow Doppler. Agitated saline contrast bubble study was negative, with no evidence of any interatrial shunt.  LEFT VENTRICLE PLAX 2D LVIDd:         5.00 cm  Diastology LVIDs:         2.80 cm   LV e' medial:    5.96 cm/s LV PW:         1.00 cm   LV E/e' medial:  11.7 LV IVS:        1.00 cm   LV e' lateral:   9.37 cm/s LVOT diam:     2.20 cm   LV E/e' lateral: 7.4 LV SV:         90 LV SV Index:   46 LVOT Area:     3.80 cm  RIGHT VENTRICLE RV Basal diam:  3.00 cm RV S prime:     14.40 cm/s TAPSE (M-mode): 2.3 cm LEFT ATRIUM             Index        RIGHT ATRIUM          Index LA diam:        3.80 cm 1.95 cm/m   RA Area:     9.94 cm LA Vol (A2C):   36.7 ml 18.82 ml/m  RA Volume:   20.05 ml 10.28 ml/m LA Vol (A4C):   32.2 ml 16.52 ml/m LA Biplane Vol: 34.8 ml 17.85 ml/m  AORTIC VALVE                    PULMONIC VALVE AV Area (Vmax):    3.77 cm     PV Vmax:          0.99 m/s AV Area (Vmean):   3.54 cm     PV Peak grad:     3.9 mmHg AV Area (VTI):     3.27 cm     PR End Diast Vel: 3.93 msec AV Vmax:           132.00 cm/s AV Vmean:          86.900 cm/s AV VTI:            0.277 m AV Peak Grad:      7.0 mmHg AV Mean Grad:      4.0 mmHg LVOT Vmax:         131.00 cm/s LVOT Vmean:        81.000 cm/s LVOT VTI:          0.238 m LVOT/AV VTI ratio: 0.86 AR Vena Contracta: 0.40 cm  AORTA Ao Root diam: 3.20 cm Ao Asc diam:  3.60 cm Ao Arch diam: 3.3 cm MITRAL VALVE               TRICUSPID VALVE MV Area (PHT): 3.06 cm    TR Peak grad:   8.4  mmHg MV Decel Time: 248 msec    TR Vmax:        145.00 cm/s MV E velocity: 69.80 cm/s MV A velocity: 83.40 cm/s  SHUNTS MV E/A ratio:  0.84        Systemic VTI:  0.24 m                            Systemic Diam: 2.20 cm Rudean Haskell MD Electronically signed by Rudean Haskell MD Signature Date/Time: 01/03/2023/5:20:02 PM    Final    MR ANGIO HEAD WO CONTRAST  Result Date: 01/03/2023 CLINICAL DATA:  62 year old male code stroke presentation yesterday. TIA. Blurred vision. Gait instability. Hypertensive. HIV. EXAM: MRA HEAD WITHOUT CONTRAST TECHNIQUE: Angiographic images of the Circle of Willis were acquired using  MRA technique without intravenous contrast. COMPARISON:  Brain MRI and Neck MRA today reported separately. FINDINGS: Anterior circulation: Antegrade flow in both ICA siphons. Distal cervical ICAs appear negative. Bilateral anterior genu appear irregular compatible with atherosclerosis. But no significant stenosis. Normal ophthalmic artery origins. Patent carotid termini. Normal MCA and ACA origins. Right ACA A1 appears mildly dominant. The anterior communicating artery and visible ACA branches are within normal limits. Bilateral MCA M1 segments and bifurcations appear patent without stenosis. Visible bilateral MCA branches are within normal limits. Posterior circulation: Antegrade flow in the posterior circulation. As in the neck, the distal left vertebral artery appears slightly dominant. Patent distal vertebral arteries and vertebrobasilar junction without stenosis. Patent PICA origins. Patent basilar artery without stenosis. Normal SCA and PCA origins. Posterior communicating arteries are diminutive or absent. Bilateral PCA branches are within normal limits. Anatomic variants: Mildly dominant left vertebral artery. Other: Brain MRI today is reported separately. IMPRESSION: 1. Intracranial atherosclerosis appears limited to the ICA siphons, mild for age. No evidence of significant intracranial  artery stenosis. 2. Neck MRA and Brain MRI today reported separately. Electronically Signed   By: Genevie Ann M.D.   On: 01/03/2023 07:20   MR ANGIO NECK WO CONTRAST  Result Date: 01/03/2023 CLINICAL DATA:  62 year old male code stroke presentation yesterday. TIA. Blurred vision. Gait instability. Hypertensive. HIV. EXAM: MRA NECK WITHOUT CONTRAST TECHNIQUE: Angiographic images of the neck were acquired using MRA technique without intravenous contrast. Carotid stenosis measurements (when applicable) are obtained utilizing NASCET criteria, using the distal internal carotid diameter as the denominator. COMPARISON:  Brain MRI today reported separately. FINDINGS: Noncontrast time-of-flight neck MRA. Axial images are intermittently motion degraded, moderately degraded at the thoracic inlet level. But there is maintained bilateral cervical carotid and vertebral artery antegrade flow signal. Sagittal time-of-flight images are of better quality. Carotid bifurcations appear patent with only mild irregularity. No strong evidence of hemodynamically significant cervical carotid stenosis. Likewise, no strong evidence of hemodynamically significant cervical vertebral artery stenosis. The left vertebral appears slightly dominant. Grossly negative visible noncontrast neck soft tissues. IMPRESSION: Partially motion degraded noncontrast Neck MRA with no evidence of hemodynamically significant cervical carotid or vertebral artery stenosis. Electronically Signed   By: Genevie Ann M.D.   On: 01/03/2023 07:17   MR BRAIN WO CONTRAST  Result Date: 01/03/2023 CLINICAL DATA:  62 year old male code stroke presentation yesterday. TIA. Blurred vision. Gait instability. Hypertensive. HIV. EXAM: MRI HEAD WITHOUT CONTRAST TECHNIQUE: Multiplanar, multiecho pulse sequences of the brain and surrounding structures were obtained without intravenous contrast. COMPARISON:  Head CT yesterday. FINDINGS: Brain: Cerebral volume is within normal limits for age.  Small patchy and indistinct area of abnormal trace diffusion in an 11 mm area of the anterior left frontal lobe white matter, series 5, image 88 and coronal DWI series 7, image 66. This appears mildly facilitated on ADC. Associated T2 and FLAIR hyperintensity. No superimposed diffusion restriction identified. And a similar contralateral right centrum semiovale focus of T2 and FLAIR hyperintensity series 11, image 20 is negative on diffusion. No superimposed cortical encephalomalacia and otherwise largely normal for age gray and white matter signal in the brain. There is mild nonspecific T2 heterogeneity in the deep gray nuclei. No convincing chronic cerebral blood products. No midline shift, mass effect, evidence of mass lesion, ventriculomegaly, extra-axial collection or acute intracranial hemorrhage. Cervicomedullary junction and pituitary are within normal limits. Vascular: Major intracranial vascular flow voids are preserved. Skull and upper cervical spine: Negative for age visible cervical spine. Visualized bone  marrow signal is within normal limits. Sinuses/Orbits: Negative orbits.  Mild paranasal sinus inflammation. Other: Mild left mastoid air cell effusion. Negative visible nasopharynx. Negative other visible internal auditory structures. Negative visible scalp and face. IMPRESSION: 1. Small areas of abnormal signal in the bilateral frontal lobe white matter, nonspecific. But anteriorly on the left diffusion is mildly abnormal and that could be a Subacute White Matter Infarct (series 5, image 88). Alternatively, encephalitis would be difficult to exclude in the setting of HIV. No associated hemorrhage or mass effect. 2. No other acute intracranial abnormality. And otherwise mild nonspecific signal heterogeneity in the deep gray nuclei. 3. Mild paranasal sinus inflammation, left mastoid effusion. 4. MRA today reported separately. Electronically Signed   By: Genevie Ann M.D.   On: 01/03/2023 07:14   DG Chest  Port 1 View  Result Date: 01/02/2023 CLINICAL DATA:  Altered mental status EXAM: PORTABLE CHEST 1 VIEW COMPARISON:  05/30/2022 FINDINGS: The heart size and mediastinal contours are within normal limits. Both lungs are clear. The visualized skeletal structures are unremarkable. IMPRESSION: No active disease. Electronically Signed   By: Inez Catalina M.D.   On: 01/02/2023 20:59   CT HEAD CODE STROKE WO CONTRAST  Result Date: 01/02/2023 CLINICAL DATA:  Code stroke. Confusion, unsteady gait, nausea, near syncope EXAM: CT HEAD WITHOUT CONTRAST TECHNIQUE: Contiguous axial images were obtained from the base of the skull through the vertex without intravenous contrast. RADIATION DOSE REDUCTION: This exam was performed according to the departmental dose-optimization program which includes automated exposure control, adjustment of the mA and/or kV according to patient size and/or use of iterative reconstruction technique. COMPARISON:  None Available. FINDINGS: Brain: No evidence of acute infarction, hemorrhage, mass, mass effect, or midline shift. No hydrocephalus or extra-axial collection. Vascular: No hyperdense vessel. Atherosclerotic calcifications in the intracranial carotid and vertebral arteries. Skull: Negative for fracture or focal lesion. Sinuses/Orbits: Mild mucosal thickening in the maxillary sinuses and ethmoid air cells. No acute finding in the orbits. Other: The mastoid air cells are well aerated. ASPECTS [facility] Stroke Program Early CT Score) - Ganglionic level infarction (caudate, lentiform nuclei, internal capsule, insula, M1-M3 cortex): 7 - Supraganglionic infarction (M4-M6 cortex): 3 Total score (0-10 with 10 being normal): 10 IMPRESSION: 1. No acute intracranial process. 2. ASPECTS is 10. Code stroke imaging results were communicated on 01/02/2023 at 8:15 pm to provider [place] via telephone, who verbally acknowledged these results. Electronically Signed   By: Merilyn Baba M.D.   On: 01/02/2023 20:16     DISCHARGE EXAMINATION: See PN and H&P from earlier today  DISPOSITION: Home  Discharge Instructions     Ambulatory referral to Neurology   Complete by: As directed    Follow up with stroke clinic NP (Jessica Vanschaick or Cecille Rubin, if both not available, consider Zachery Dauer, or Ahern) at [facility] in about 4 weeks. Thanks.   Call MD for:  difficulty breathing, headache or visual disturbances   Complete by: As directed    Call MD for:  extreme fatigue   Complete by: As directed    Call MD for:  persistant dizziness or light-headedness   Complete by: As directed    Call MD for:  persistant nausea and vomiting   Complete by: As directed    Call MD for:  severe uncontrolled pain   Complete by: As directed    Call MD for:  temperature >100.4   Complete by: As directed    Diet - low sodium heart healthy   Complete  by: As directed    Discharge instructions   Complete by: As directed    Please f/u with your PCP for further management of high blood pressure.   You were cared for by a hospitalist during your hospital stay. If you have any questions about your discharge medications or the care you received while you were in the hospital after you are discharged, you can call the unit and asked to speak with the hospitalist on call if the hospitalist that took care of you is not available. Once you are discharged, your primary care physician will handle any further medical issues. Please note that NO REFILLS for any discharge medications will be authorized once you are discharged, as it is imperative that you return to your primary care physician (or establish a relationship with a primary care physician if you do not have one) for your aftercare needs so that they can reassess your need for medications and monitor your lab values. If you do not have a primary care physician, you can call [phone] for a physician referral.   Increase activity slowly   Complete by: As directed            Allergies as of 01/03/2023       Reactions   Amlodipine Besylate Swelling   Suspected angioedema of bowel   Cepacol [benzocaine-menthol] Anaphylaxis   Morphine Anaphylaxis, Other (See Comments)   Resp distress, rash, tachycardia   Vibramycin [doxycycline] Nausea And Vomiting   Celebrex [celecoxib] Other (See Comments)   Elevated LFT's   Wellbutrin [bupropion] Hives        Medication List     TAKE these medications    anastrozole 1 MG tablet Commonly known as: ARIMIDEX Take 1 tablet (1 mg total) by mouth once a week. What changed:  when to take this Another medication with the same name was removed. Continue taking this medication, and follow the directions you see here.   aspirin EC 81 MG tablet Take 1 tablet (81 mg total) by mouth daily. Swallow whole. Start taking on: January 04, 2023   Biktarvy 50-200-25 MG Tabs tablet Generic drug: bictegravir-emtricitabine-tenofovir AF Take 1 tablet by mouth daily.   citalopram 20 MG tablet Commonly known as: CELEXA Take 1 tablet (20 mg total) by mouth daily.   clopidogrel 75 MG tablet Commonly known as: PLAVIX Take 1 tablet (75 mg total) by mouth daily for 21 days. Start taking on: January 04, 2023   losartan 25 MG tablet Commonly known as: COZAAR Take 1 tablet (25 mg total) by mouth daily.   methocarbamol 750 MG tablet Commonly known as: Robaxin-750 Take 1 tablet (750 mg total) by mouth 4 (four) times daily.   ondansetron 4 MG disintegrating tablet Commonly known as: ZOFRAN-ODT Take 1 tablet (4 mg total) by mouth every 8 (eight) hours as needed for nausea or vomiting. What changed: when to take this   rosuvastatin 20 MG tablet Commonly known as: CRESTOR Take 1 tablet (20 mg total) by mouth daily. Start taking on: January 04, 2023   tadalafil 5 MG tablet Commonly known as: CIALIS Take 1 tablet by mouth once daily   testosterone cypionate 200 MG/ML injection Commonly known as: DEPOTESTOSTERONE  CYPIONATE Inject 0.5 mLs (100 mg total) into the muscle once a week. What changed:  how much to take when to take this   zolpidem 10 MG tablet Commonly known as: AMBIEN TAKE 1 TABLET BY MOUTH EVERY NIGHT AT BEDTIME AS NEEDED FOR SLEEP What changed:  how much to take reasons to take this          [facility]. Schedule an appointment as soon as possible for a visit in 1 month(s).   Specialty: Neurology Why: stroke clinic Contact information: [place] [phone]        [place], Blondell Reveal, MD Follow up.   Specialty: Family Medicine Why: post hospitalization follow up Contact information: [address] [place] [address] [phone]                 [place]: 17 mins  [place]  [facility] operational officer on [email or web address]  01/03/2023, 5:25 PM

## 2023-01-03 NOTE — TOC Transition Note (Addendum)
Transition of Care [facility]) - CM/SW Discharge Note   Patient Details  Name: Bruce Woods MRN: [id number] Date of Birth: 23-Jan-1961  [facility] CM/SW Contact:  Pollie Friar, RN Phone Number: 01/03/2023, 1:45 PM   Clinical Narrative:    Pt is from home with spouse. PT recommending outpatient therapy for vestibular. Pt wants to wait and see his PCP first. He will have his PCP office arrange if he decides to attend. Pt has transportation home when discharged.    Final next level of care: Home/Self Care Barriers to Discharge: No Barriers Identified   Patient Goals and CMS Choice      Discharge Placement                         Discharge Plan and Services Additional resources added to the After Visit Summary for                                       Social Determinants of Health (SDOH) Interventions SDOH Screenings   Food Insecurity: No Food Insecurity (01/03/2023)  Housing: Low Risk  (01/03/2023)  Transportation Needs: No Transportation Needs (01/03/2023)  Utilities: Not At Risk (01/03/2023)  Depression (PHQ2-9): Low Risk  (10/05/2022)  Tobacco Use: Medium Risk (01/03/2023)     Readmission Risk Interventions     No data to display

## 2023-01-03 NOTE — Assessment & Plan Note (Addendum)
Well controlled.  Continue biktarvy

## 2023-01-03 NOTE — Consult Note (Addendum)
Stroke Neurology Consultation Note  Reason for Consult: Stroke   Consult Date:  01/03/23  The history was obtained from the patient.   History of Present Illness:  Bruce Woods is an 62 y.o. Caucasian male with PMH of hypertension, erectile dysfunction, HIV, sarcoidosis, anxiety, presented to the ED with sudden onset of blurred vision, gait instability, word finding.  He had finished eating dinner at home, started up and had to grab a hold of the eyelid because he became dizzy and lightheaded.  He was having word finding difficulties and felt like he was going to pass out.  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  She checked his blood pressure and his systolic was elevated.  They live approximately 1 mile from [facility] and she drove him to the ER.  On the way to the ER he felt like he kept leaning to the left and noted that he was having double vision and it looked like the road lines were zigzagging.  Additionally he became very nauseous.  He tells me that he has been having trouble when he turns his head to the side too fast or when he changes positions fast with becoming lightheaded which has been going on for a couple of weeks.  Date last known well: Date: 01/02/2023 Time last known well: Time: 19:00 tPA Given: No:  MRS:  0 NIHSS:  0  Past Medical History:  Diagnosis Date   Angio-edema suspected of small bowel question due to amlodipine [id number]   Complication of anesthesia 07/28/2022   oxygen desaturations to 60's during EGD, bronchospasm after scope removed, reported being told he has suspected undiagnosed OSA   COVID-19 07/2021   DEPRESSION    Gastritis and gastroduodenitis    GERD (gastroesophageal reflux disease)    History of hiatal hernia    HIV DISEASE    Hypertension    IBS (irritable bowel syndrome)    Sarcoidosis 2000   question of   Ulnar nerve compression, right      Past Surgical History:  Procedure Laterality Date   COLONOSCOPY  2010    ESOPHAGOGASTRODUODENOSCOPY  2002   LUNG SURGERY  10/31/1998   vats   TONSILLECTOMY AND ADENOIDECTOMY     ULNAR NERVE TRANSPOSITION Right 05/17/2018   Procedure: RIGHT ULNAR NERVE DECOMPRESSION/TRANSPOSITION;  Surgeon: Leanora Cover, MD;  Location: [place];  Service: Orthopedics;  Laterality: Right;   XI ROBOTIC ASSISTED HIATAL HERNIA REPAIR N/A 09/13/2022   Procedure: XI ROBOTIC ASSISTED HIATAL HERNIA REPAIR WITH FUNDOPLICATION WITH MESH;  Surgeon: Ralene Ok, MD;  Location: [place];  Service: General;  Laterality: N/A;    Family History  Problem Relation Age of Onset   Hyperlipidemia Mother    Hypertension Mother    Cancer Mother        glioblastoma   Sudden death Neg Hx    Heart attack Neg Hx    Diabetes Neg Hx    Colon cancer Neg Hx    Prostate cancer Neg Hx      Social History:  reports that he quit smoking about 39 years ago. His smoking use included cigarettes. He has a 2.00 pack-year smoking history. He has never used smokeless tobacco. He reports current alcohol use of about 1.0 standard drink of alcohol per week. He reports that he does not use drugs.  Review of Systems: A full ROS was attempted today and was able to be performed.  Systems assessed include - Constitutional, Eyes, HENT, Respiratory, Cardiovascular,  Gastrointestinal, Genitourinary, Integument/breast, Hematologic/lymphatic, Musculoskeletal, Neurological, Behavioral/Psych, Endocrine, Allergic/Immunologic - with pertinent responses as per HPI.  Allergies:  Allergies  Allergen Reactions   Amlodipine Besylate Swelling    Suspected angioedema of bowel   Cepacol [Benzocaine-Menthol] Anaphylaxis   Morphine Anaphylaxis and Other (See Comments)    Resp distress, rash, tachycardia   Vibramycin [Doxycycline] Nausea And Vomiting   Celebrex [Celecoxib] Other (See Comments)    Elevated LFT's   Wellbutrin [Bupropion] Hives     Medications: Prior to Admission:  Facility-Administered Medications  Prior to Admission  Medication Dose Route Frequency Provider Last Rate Last Admin   ondansetron (ZOFRAN) 4 mg in sodium chloride 0.9 % 50 mL IVPB  4 mg Intravenous Once Cirigliano, Vito V, DO       Medications Prior to Admission  Medication Sig Dispense Refill Last Dose   anastrozole (ARIMIDEX) 1 MG tablet Take 1 tablet (1 mg total) by mouth once a week. (Patient taking differently: Take 1 mg by mouth every Friday.) 20 tablet 1 Past Week   bictegravir-emtricitabine-tenofovir AF (BIKTARVY) 50-200-25 MG TABS tablet Take 1 tablet by mouth daily. 30 tablet 11 01/02/2023   citalopram (CELEXA) 20 MG tablet Take 1 tablet (20 mg total) by mouth daily. 90 tablet 3 01/02/2023   losartan (COZAAR) 25 MG tablet Take 1 tablet (25 mg total) by mouth daily. 30 tablet 1 01/02/2023   methocarbamol (ROBAXIN-750) 750 MG tablet Take 1 tablet (750 mg total) by mouth 4 (four) times daily. 30 tablet 0 Past Week   ondansetron (ZOFRAN-ODT) 4 MG disintegrating tablet Take 1 tablet (4 mg total) by mouth every 8 (eight) hours as needed for nausea or vomiting. (Patient taking differently: Take 4 mg by mouth in the morning, at noon, and at bedtime.) 20 tablet 0 Unk   tadalafil (CIALIS) 5 MG tablet Take 1 tablet by mouth once daily 90 tablet 3 01/02/2023   testosterone cypionate (DEPOTESTOSTERONE CYPIONATE) 200 MG/ML injection Inject 0.5 mLs (100 mg total) into the muscle once a week. (Patient taking differently: Inject 200 mg into the muscle every Friday.) 10 mL 0 Past Week   zolpidem (AMBIEN) 10 MG tablet TAKE 1 TABLET BY MOUTH EVERY NIGHT AT BEDTIME AS NEEDED FOR SLEEP (Patient taking differently: Take 5 mg by mouth at bedtime as needed for sleep.) 30 tablet 1 Past Week   anastrozole (ARIMIDEX) 1 MG tablet Take 1 tablet by mouth once weekly (Patient not taking: Reported on 01/03/2023) 20 tablet 1 Not Taking    Test Results: CBC:  Recent Labs  Lab 01/02/23 1947 01/02/23 1957  WBC 7.3  --   NEUTROABS  --  4.6  HGB 16.8  --   HCT  49.8  --   MCV 88.5  --   PLT 189  --    Basic Metabolic Panel:  Recent Labs  Lab 01/02/23 1947  NA 137  K 3.3*  CL 103  CO2 27  GLUCOSE 82  BUN 10  CREATININE 1.38*  CALCIUM 8.7*   Liver Function Tests: Recent Labs  Lab 01/02/23 1957  AST 20  ALT 13  ALKPHOS 74  BILITOT 0.8  PROT 7.9  ALBUMIN 4.3   No results for input(s): "LIPASE", "AMYLASE" in the last 168 hours. No results for input(s): "AMMONIA" in the last 168 hours. Coagulation Studies:  Recent Labs    01/02/23 1957  LABPROT 14.3  INR 1.1   Cardiac Enzymes: No results for input(s): "CKTOTAL", "CKMB", "CKMBINDEX", "TROPONINI" in the last 168 hours. BNP: Invalid  input(s): "POCBNP" CBG:  Recent Labs  Lab 01/02/23 2002  GLUCAP 134*   Urinalysis:  Recent Labs  Lab 01/02/23 1947  COLORURINE YELLOW  LABSPEC 1.020  PHURINE 6.5  GLUCOSEU NEGATIVE  HGBUR NEGATIVE  BILIRUBINUR NEGATIVE  KETONESUR NEGATIVE  PROTEINUR NEGATIVE  NITRITE NEGATIVE  LEUKOCYTESUR NEGATIVE   Microbiology:  Results for orders placed or performed during the hospital encounter of 03/15/22  Resp Panel by RT-PCR (Flu A&B, Covid) Nasopharyngeal Swab     Status: None   Collection Time: 03/15/22  7:15 PM   Specimen: Nasopharyngeal Swab; Nasopharyngeal(NP) swabs in vial transport medium  Result Value Ref Range Status   SARS Coronavirus 2 by RT PCR NEGATIVE NEGATIVE Final    Comment: (NOTE) SARS-CoV-2 target nucleic acids are NOT DETECTED.  The SARS-CoV-2 RNA is generally detectable in upper respiratory specimens during the acute phase of infection. The lowest concentration of SARS-CoV-2 viral copies this assay can detect is 138 copies/mL. A negative result does not preclude SARS-Cov-2 infection and should not be used as the sole basis for treatment or other patient management decisions. A negative result may occur with  improper specimen collection/handling, submission of specimen other than nasopharyngeal swab, presence of  viral mutation(s) within the areas targeted by this assay, and inadequate number of viral copies(<138 copies/mL). A negative result must be combined with clinical observations, patient history, and epidemiological information. The expected result is Negative.  Fact Sheet for Patients:  EntrepreneurPulse.com.au  Fact Sheet for Healthcare Providers:  IncredibleEmployment.be  This test is no t yet approved or cleared by the [place] FDA and  has been authorized for detection and/or diagnosis of SARS-CoV-2 by FDA under an Emergency Use Authorization (EUA). This EUA will remain  in effect (meaning this test can be used) for the duration of the COVID-19 declaration under Section 564(b)(1) of the Act, 21 U.S.C.section 360bbb-3(b)(1), unless the authorization is terminated  or revoked sooner.       Influenza A by PCR NEGATIVE NEGATIVE Final   Influenza B by PCR NEGATIVE NEGATIVE Final    Comment: (NOTE) The Xpert Xpress SARS-CoV-2/FLU/RSV plus assay is intended as an aid in the diagnosis of influenza from Nasopharyngeal swab specimens and should not be used as a sole basis for treatment. Nasal washings and aspirates are unacceptable for Xpert Xpress SARS-CoV-2/FLU/RSV testing.  Fact Sheet for Patients: EntrepreneurPulse.com.au  Fact Sheet for Healthcare Providers: IncredibleEmployment.be  This test is not yet approved or cleared by the [place] FDA and has been authorized for detection and/or diagnosis of SARS-CoV-2 by FDA under an Emergency Use Authorization (EUA). This EUA will remain in effect (meaning this test can be used) for the duration of the COVID-19 declaration under Section 564(b)(1) of the Act, 21 U.S.C. section 360bbb-3(b)(1), unless the authorization is terminated or revoked.  Performed at [facility], [address] [place], [place] [address]   Group A Strep by PCR      Status: None   Collection Time: 03/15/22  7:15 PM   Specimen: Nasopharyngeal Swab; Sterile Swab  Result Value Ref Range Status   Group A Strep by PCR NOT DETECTED NOT DETECTED Final    Comment: Performed at [facility], [address] [place], [place] [address]  Culture, blood (Routine X 2) w Reflex to ID Panel     Status: None   Collection Time: 03/15/22  7:15 PM   Specimen: BLOOD  Result Value Ref Range Status   Specimen Description  Final    BLOOD RIGHT ANTECUBITAL Performed at [facility], [address] [place], [place] [address]    Special Requests   Final    BOTTLES DRAWN AEROBIC AND ANAEROBIC Blood Culture adequate volume Performed at [facility], [address] [place], [place] [address]    Culture   Final    NO GROWTH 5 DAYS Performed at [facility], [place] [address] [place], [place] [address]    Report Status 03/20/2022 FINAL  Final  Culture, blood (Routine X 2) w Reflex to ID Panel     Status: None   Collection Time: 03/15/22  7:20 PM   Specimen: BLOOD  Result Value Ref Range Status   Specimen Description   Final    BLOOD LEFT ANTECUBITAL Performed at [facility], [address] [place], [place] [address]    Special Requests   Final    BOTTLES DRAWN AEROBIC AND ANAEROBIC Blood Culture adequate volume Performed at [facility], [address], [place], [place] [address]    Culture   Final    NO GROWTH 5 DAYS Performed at [facility], [place] [address], [place],  [address]    Report Status 03/20/2022 FINAL  Final   Lipid Panel:     Component Value Date/Time   CHOL 160 01/03/2023 0808   CHOL 162 12/14/2022 0819   TRIG 97 01/03/2023 0808   HDL 29 (L) 01/03/2023 0808   HDL 29 (L) 12/14/2022 0819   CHOLHDL 5.5 01/03/2023 0808   VLDL 19 01/03/2023 0808   LDLCALC 112 (H) 01/03/2023 0808   LDLCALC 109 (H) 12/14/2022 0819   LDLCALC 117 (H) 09/21/2022 0848   HgbA1c:  Lab Results   Component Value Date   HGBA1C 5.4 12/14/2022   Urine Drug Screen:     Component Value Date/Time   LABOPIA NONE DETECTED 01/02/2023 1957   COCAINSCRNUR NONE DETECTED 01/02/2023 1957   LABBENZ POSITIVE (A) 01/02/2023 1957   AMPHETMU NONE DETECTED 01/02/2023 1957   THCU NONE DETECTED 01/02/2023 1957   LABBARB NONE DETECTED 01/02/2023 1957    Alcohol Level:  Recent Labs  Lab 01/03/23 0026  ETH <10    MR ANGIO HEAD WO CONTRAST  Result Date: 01/03/2023 CLINICAL DATA:  62 year old male code stroke presentation yesterday. TIA. Blurred vision. Gait instability. Hypertensive. HIV. EXAM: MRA HEAD WITHOUT CONTRAST TECHNIQUE: Angiographic images of the Circle of Willis were acquired using MRA technique without intravenous contrast. COMPARISON:  Brain MRI and Neck MRA today reported separately. FINDINGS: Anterior circulation: Antegrade flow in both ICA siphons. Distal cervical ICAs appear negative. Bilateral anterior genu appear irregular compatible with atherosclerosis. But no significant stenosis. Normal ophthalmic artery origins. Patent carotid termini. Normal MCA and ACA origins. Right ACA A1 appears mildly dominant. The anterior communicating artery and visible ACA branches are within normal limits. Bilateral MCA M1 segments and bifurcations appear patent without stenosis. Visible bilateral MCA branches are within normal limits. Posterior circulation: Antegrade flow in the posterior circulation. As in the neck, the distal left vertebral artery appears slightly dominant. Patent distal vertebral arteries and vertebrobasilar junction without stenosis. Patent PICA origins. Patent basilar artery without stenosis. Normal SCA and PCA origins. Posterior communicating arteries are diminutive or absent. Bilateral PCA branches are within normal limits. Anatomic variants: Mildly dominant left vertebral artery. Other: Brain MRI today is reported separately. IMPRESSION: 1. Intracranial atherosclerosis appears  limited to the ICA siphons, mild for  age. No evidence of significant intracranial artery stenosis. 2. Neck MRA and Brain MRI today reported separately. Electronically Signed   By: Genevie Ann M.D.   On: 01/03/2023 07:20   MR ANGIO NECK WO CONTRAST  Result Date: 01/03/2023 CLINICAL DATA:  62 year old male code stroke presentation yesterday. TIA. Blurred vision. Gait instability. Hypertensive. HIV. EXAM: MRA NECK WITHOUT CONTRAST TECHNIQUE: Angiographic images of the neck were acquired using MRA technique without intravenous contrast. Carotid stenosis measurements (when applicable) are obtained utilizing NASCET criteria, using the distal internal carotid diameter as the denominator. COMPARISON:  Brain MRI today reported separately. FINDINGS: Noncontrast time-of-flight neck MRA. Axial images are intermittently motion degraded, moderately degraded at the thoracic inlet level. But there is maintained bilateral cervical carotid and vertebral artery antegrade flow signal. Sagittal time-of-flight images are of better quality. Carotid bifurcations appear patent with only mild irregularity. No strong evidence of hemodynamically significant cervical carotid stenosis. Likewise, no strong evidence of hemodynamically significant cervical vertebral artery stenosis. The left vertebral appears slightly dominant. Grossly negative visible noncontrast neck soft tissues. IMPRESSION: Partially motion degraded noncontrast Neck MRA with no evidence of hemodynamically significant cervical carotid or vertebral artery stenosis. Electronically Signed   By: Genevie Ann M.D.   On: 01/03/2023 07:17   MR BRAIN WO CONTRAST  Result Date: 01/03/2023 CLINICAL DATA:  62 year old male code stroke presentation yesterday. TIA. Blurred vision. Gait instability. Hypertensive. HIV. EXAM: MRI HEAD WITHOUT CONTRAST TECHNIQUE: Multiplanar, multiecho pulse sequences of the brain and surrounding structures were obtained without intravenous contrast. COMPARISON:   Head CT yesterday. FINDINGS: Brain: Cerebral volume is within normal limits for age. Small patchy and indistinct area of abnormal trace diffusion in an 11 mm area of the anterior left frontal lobe white matter, series 5, image 88 and coronal DWI series 7, image 66. This appears mildly facilitated on ADC. Associated T2 and FLAIR hyperintensity. No superimposed diffusion restriction identified. And a similar contralateral right centrum semiovale focus of T2 and FLAIR hyperintensity series 11, image 20 is negative on diffusion. No superimposed cortical encephalomalacia and otherwise largely normal for age gray and white matter signal in the brain. There is mild nonspecific T2 heterogeneity in the deep gray nuclei. No convincing chronic cerebral blood products. No midline shift, mass effect, evidence of mass lesion, ventriculomegaly, extra-axial collection or acute intracranial hemorrhage. Cervicomedullary junction and pituitary are within normal limits. Vascular: Major intracranial vascular flow voids are preserved. Skull and upper cervical spine: Negative for age visible cervical spine. Visualized bone marrow signal is within normal limits. Sinuses/Orbits: Negative orbits.  Mild paranasal sinus inflammation. Other: Mild left mastoid air cell effusion. Negative visible nasopharynx. Negative other visible internal auditory structures. Negative visible scalp and face. IMPRESSION: 1. Small areas of abnormal signal in the bilateral frontal lobe white matter, nonspecific. But anteriorly on the left diffusion is mildly abnormal and that could be a Subacute White Matter Infarct (series 5, image 88). Alternatively, encephalitis would be difficult to exclude in the setting of HIV. No associated hemorrhage or mass effect. 2. No other acute intracranial abnormality. And otherwise mild nonspecific signal heterogeneity in the deep gray nuclei. 3. Mild paranasal sinus inflammation, left mastoid effusion. 4. MRA today reported  separately. Electronically Signed   By: Genevie Ann M.D.   On: 01/03/2023 07:14   DG Chest Port 1 View  Result Date: 01/02/2023 CLINICAL DATA:  Altered mental status EXAM: PORTABLE CHEST 1 VIEW COMPARISON:  05/30/2022 FINDINGS: The heart size and mediastinal contours are within normal limits. Both  lungs are clear. The visualized skeletal structures are unremarkable. IMPRESSION: No active disease. Electronically Signed   By: Inez Catalina M.D.   On: 01/02/2023 20:59   CT HEAD CODE STROKE WO CONTRAST  Result Date: 01/02/2023 CLINICAL DATA:  Code stroke. Confusion, unsteady gait, nausea, near syncope EXAM: CT HEAD WITHOUT CONTRAST TECHNIQUE: Contiguous axial images were obtained from the base of the skull through the vertex without intravenous contrast. RADIATION DOSE REDUCTION: This exam was performed according to the departmental dose-optimization program which includes automated exposure control, adjustment of the mA and/or kV according to patient size and/or use of iterative reconstruction technique. COMPARISON:  None Available. FINDINGS: Brain: No evidence of acute infarction, hemorrhage, mass, mass effect, or midline shift. No hydrocephalus or extra-axial collection. Vascular: No hyperdense vessel. Atherosclerotic calcifications in the intracranial carotid and vertebral arteries. Skull: Negative for fracture or focal lesion. Sinuses/Orbits: Mild mucosal thickening in the maxillary sinuses and ethmoid air cells. No acute finding in the orbits. Other: The mastoid air cells are well aerated. ASPECTS [facility] Stroke Program Early CT Score) - Ganglionic level infarction (caudate, lentiform nuclei, internal capsule, insula, M1-M3 cortex): 7 - Supraganglionic infarction (M4-M6 cortex): 3 Total score (0-10 with 10 being normal): 10 IMPRESSION: 1. No acute intracranial process. 2. ASPECTS is 10. Code stroke imaging results were communicated on 01/02/2023 at 8:15 pm to provider [place] via telephone, who verbally  acknowledged these results. Electronically Signed   By: Merilyn Baba M.D.   On: 01/02/2023 20:16     EKG: normal EKG, normal sinus rhythm, unchanged from previous tracings.   Physical Examination: Temp:  [97.8 F (36.6 C)-98.2 F (36.8 C)] 97.9 F (36.6 C) (03/05 1159) Pulse Rate:  [52-83] 53 (03/05 1159) Resp:  [12-20] 16 (03/05 1159) BP: (150-188)/(80-106) 152/80 (03/05 1159) SpO2:  [94 %-99 %] 95 % (03/05 1159) Weight:  [81.2 kg-82.2 kg] 81.2 kg (03/04 1957)  General - Well nourished, well developed, in no apparent distress.  Cardiovascular - Regular rate and rhythm.  Mental Status -  Alert and oriented x 4, language including expression, naming, repetition, comprehension intact. Normal attention span and concentration. Recent and remote memory were intact. Fund of Knowledge intact.  Cranial Nerves II - XII - Visual field intact OU. Extraocular movements intact.  Facial sensation intact bilaterally.  Facial movement intact bilaterally. Hearing & vestibular intact bilaterally. Palate elevates symmetrically. Chin turning & shoulder shrug intact bilaterally. Tongue protrusion intact.  Motor Strength - The patient's strength was normal in all extremities and pronator drift was absent.  Bulk was normal and fasciculations were absent.   Motor Tone - Muscle tone and bulk are normal Sensory - Light touch and temperature symmetrical in all extremities  Coordination - The patient had normal movements in the hands and feet with no ataxia or dysmetria. Gait and Station - deferred.   Assessment:  Mr. Bruce Woods is a 62 y.o. male with history of hypertension, erectile dysfunction, HIV, sarcoidosis, anxiety, presented to the ED with sudden onset of blurred vision, gait instability, word finding. He did not receive IV t-PA due to symptoms resolved.  He has been started on aspirin and Plavix for 3 weeks and then will remain on aspirin 81 mg alone.  He can [facility] in approximately 8 weeks.  Additionally he should follow-up with his primary care provider in 2 weeks.  Posterior TIA Incidental subacute left frontal WM infarct Code Stroke CT head No acute stroke. ASPECTS 10.  MRI  Small areas of abnormal signal in the bilateral frontal lobe white matter, nonspecific. But anteriorly on the left diffusion is mildly abnormal and that could be a Subacute White Matter Infarct (series 5, image 88). Alternatively, encephalitis would be difficult to exclude in the setting of HIV. No associated hemorrhage or mass effect. MRA intracranial atherosclerosis limited to the ICA siphons 2D Echo EF 60 to 65%, no PFO LDL 112 HgbA1c 5.4  Lovenox for VTE prophylaxis No antithrombotic prior to admission, now on aspirin 81 mg daily and clopidogrel 75 mg daily for 3 weeks and then ASA alone.  Therapy recommendations: Outpatient vestibular PT Disposition: Home  Hypertension Home meds: Losartan Stable Long-term BP goal normotensive  Hyperlipidemia LDL 112, goal < 70 Add Crestor 20 mg Continue statin at discharge  Other Stroke Risk Factors ETOH use, advised to drink no more than 2 drink(s) a day  Other Active Problems HIV  Well controlled on Biktarvy CD4 430 Hypokalemia Hx of sarcoidosis - no recent issues Hx of transient polycythemia - likely due to dehydration 08/2022, resolved now  Hospital day # 0   Patient seen and examined by NP/APP with MD. MD to update note as needed.   Janine Ores, DNP, FNP-BC [facility] Pager: [phone]  ATTENDING NOTE: I reviewed above note and agree with the assessment and plan. Pt was seen and examined.   No family at the bedside. Pt stated that he was finishing dinner last night 1930, getting up from table, stumbled, imbalance, pulling to the left, diplopia, lightheadedness but no vertigo. Come to ER found to have b/l hand dysmetria on FTN, more on the left, symptoms resolved at 2100.  CT neg, MRI showed ? left frontal WM subacute infarct, MRA head and neck mild b/l ICA siphon athero. Echo EF 60 to 65%, LDL 109 and A1C 5.4, UDS positive for benzo. Cre 1.38  On exam today, pt is neuro intact. 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 Etiology for pt symptoms concerning for posterior TIA, the MRI finding could be incidental. But will start treatment with DAPT for 3 weeks and then ASA alone, add crestor 20. PT/OT outpt vertebral PT.   For detailed assessment and plan, please refer to above/below as I have made changes wherever appropriate.   Neurology will sign off. Please call with questions. Pt will follow up with stroke clinic NP at [facility] in about 4 weeks. Thanks for the consult.   Rosalin Hawking, MD PhD Stroke Neurology 01/03/2023 3:28 PM    To contact Stroke Continuity provider, please refer to [email or web address] After hours, contact General Neurology

## 2023-01-03 NOTE — Progress Notes (Signed)
Patient admitted earlier this morning.  H&P reviewed.    Patient seen and examined.  Patient mentions that his vision impairment resolved in about 10 to 15 minutes after onset.  Has been able to ambulate to the bathroom without difficulty this morning.  Denies any dizziness lightheadedness.  Vital signs reviewed.  Elevated blood pressure noted. Lungs are clear to auscultation bilaterally. S1-S2 is normal regular. Abdomen is soft.  Nontender nondistended. He is alert and oriented x 3.  No focal neurological deficits noted.  LDL 112.  HbA1c is pending. Patient underwent MRI this morning.  To be reviewed by neurology.  Concern for TIA versus stroke.  Neurology is following.  Echocardiogram is pending.  Does not seem to have any deficits from of strength standpoint.  Visual impairment has resolved. Currently on aspirin and Plavix.  Has been started on statin. Continue with Biktarvy for HIV. Permissive hypertension for now.  We will continue to follow.  Bonnielee Haff 01/03/2023

## 2023-01-03 NOTE — Assessment & Plan Note (Signed)
Repeat BMP in AM. Repleted with po kcl.

## 2023-01-03 NOTE — Assessment & Plan Note (Addendum)
Observation telemetry bed. TIA/CVA workup with MRI/MRA head/neck. IV valium for sedation for MRI.  Pt with dizziness with turning his head up/left and up/right. ??vertebrobasilar insufficiency?? Check bubble echo. Pt is on IM testosterone and po arimidex for his erectile dysfunction. Continue with plavix 75 mg and asa 81 mg daily per neurology.

## 2023-01-03 NOTE — Progress Notes (Signed)
SLP Cancellation Note  Patient Details Name: Bruce Woods MRN: [id number] DOB: 02-12-1961   Cancelled treatment:        Pt seen for cognitive linguistic assessment.  Pt's symptoms resolved.  He denies deficits and politely declines eval. Pt has been a Marine scientist for 37 years.  He is able to speak at a high level about plans for diagnostic assessment here and about his career plans as he gets ready for a role change next week to OP surgery recovery room.  His speech is clear and without dysarthria.  Pt has no ST needs at this time.  SLP will sign off.   Celedonio Savage, [place], [place] Office: [phone] 01/03/2023, 11:22 AM

## 2023-01-03 NOTE — Progress Notes (Signed)
OT Cancellation Note  Patient Details Name: Bruce Woods MRN: [id number] DOB: 12-20-1960   Cancelled Treatment:    Reason Eval/Treat Not Completed: OT screened, no needs identified, will sign off- pt reports back to baseline with vision, gait and mobility.   Jolaine Artist, OT Acute Rehabilitation Services Office [place] 01/03/2023, 8:47 AM

## 2023-01-03 NOTE — Evaluation (Signed)
Physical Therapy Evaluation Patient Details Name: Bruce Woods MRN: [id number] DOB: 08/07/1961 Today's Date: 01/03/2023  History of Present Illness  62 y.o. male presents to [facility] hospital on 01/02/2023 after sudden onset blurred vision, gait instability and word finding difficulties. Imaging negative for acute abnormality. PMH includes HTN, ED, HIV, sarcoidosis, anxiety.  Clinical Impression  Pt presents to PT with reports of a history of dizziness and difficulty focusing with quick head turns to left when working. With vestibular evaluation pt reports symptoms of blurring of vision and headache with dix-hallpike to left side. Pt also reports symptoms of being thrown off balance to left side with head impulse test to left (similar symptoms to what he experienced last evening per patient). PT does not note nystagmus during session, however pt's consistent history of symptoms with quick head turns to left side may warrant further vestibular assessment ant treatment in the outpatient setting. Pt appears to want to discuss with his PCP prior to pursuing outpatient evaluation. Acute PT will continue to follow during this admission. PT recommends discharge home when medically ready.       Recommendations for follow up therapy are one component of a multi-disciplinary discharge planning process, led by the attending physician.  Recommendations may be updated based on patient status, additional functional criteria and insurance authorization.  Follow Up Recommendations Outpatient PT (outpatient vestibular evaluation)      Assistance Recommended at Discharge None  Patient can return home with the following       Equipment Recommendations None recommended by PT  Recommendations for Other Services       Functional Status Assessment Patient has not had a recent decline in their functional status     Precautions / Restrictions Precautions Precautions: None Restrictions Weight Bearing Restrictions: No       Mobility  Bed Mobility Overal bed mobility: Independent                  Transfers Overall transfer level: Independent                      Ambulation/Gait Ambulation/Gait assistance: Independent (pt reports independently ambulating within hospital room during the day without symptoms) Gait Distance (Feet): 5 Feet Assistive device: None Gait Pattern/deviations: WFL(Within Functional Limits) Gait velocity: functional        Stairs            Wheelchair Mobility    Modified Rankin (Stroke Patients Only) Modified Rankin (Stroke Patients Only) Pre-Morbid Rankin Score: No symptoms Modified Rankin: No symptoms     Balance Overall balance assessment: Independent                                           Pertinent Vitals/Pain Pain Assessment Pain Assessment: No/denies pain    Home Living Family/patient expects to be discharged to:: Private residence Living Arrangements: Spouse/significant other Available Help at Discharge: Family;Available 24 hours/day Type of Home: House           Home Equipment: None      Prior Function Prior Level of Function : Independent/Modified Independent;Working/employed;Driving             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        Extremity/Trunk Assessment   Upper Extremity Assessment Upper Extremity Assessment: Overall WFL for  tasks assessed    Lower Extremity Assessment Lower Extremity Assessment: Overall WFL for tasks assessed    Cervical / Trunk Assessment Cervical / Trunk Assessment: Normal  Communication   Communication: No difficulties  Cognition Arousal/Alertness: Awake/alert Behavior During Therapy: WFL for tasks assessed/performed Overall Cognitive Status: Within Functional Limits for tasks assessed                                          General Comments General comments (skin integrity,  edema, etc.): VSS on RA. Vestibular eval: pursuits WFL, saccades WFL when wearing glasses, VOR horizontal and vertical negative, Head impulse test left +, pt reports difficulty focusing vision and feels he is being pushed to left side, symptoms last ~1 minute. Pt also reports some blurred vision and headache with dix-hallpike to left side. HIT and dix hallpike negative to R side. PT notes no nystagmus during session.    Exercises     Assessment/Plan    PT Assessment Patient needs continued PT services  PT Problem List Other (comment) (further vestibular assessment/treatment)       PT Treatment Interventions Other (comment) (vestibular rehab)    PT Goals (Current goals can be found in the Care Plan section)  Acute Rehab PT Goals Patient Stated Goal: to stop having symptoms with head turns to left PT Goal Formulation: With patient Time For Goal Achievement: 01/17/23 Potential to Achieve Goals: Good Additional Goals Additional Goal #1: Pt will consistently perform head turns to left without report of dizziness or gaze instability.    Frequency Min 2X/week     Co-evaluation               AM-PAC PT "6 Clicks" Mobility  Outcome Measure Help needed turning from your back to your side while in a flat bed without using bedrails?: None Help needed moving from lying on your back to sitting on the side of a flat bed without using bedrails?: None Help needed moving to and from a bed to a chair (including a wheelchair)?: None Help needed standing up from a chair using your arms (e.g., wheelchair or bedside chair)?: None Help needed to walk in hospital room?: None Help needed climbing 3-5 steps with a railing? : None 6 Click Score: 24    End of Session   Activity Tolerance: Patient tolerated treatment well Patient left: in bed;with call bell/phone within reach Nurse Communication: Mobility status PT Visit Diagnosis: Other symptoms and signs involving the nervous system (R29.898)     Time: 1310-1341 PT Time Calculation (min) (ACUTE ONLY): 31 min   Charges:   PT Evaluation $PT Eval Low Complexity: Dickinson, PT, DPT Acute Rehabilitation Office [phone]   Zenaida Niece 01/03/2023, 2:01 PM

## 2023-01-03 NOTE — Plan of Care (Signed)
  Problem: Education: Goal: Knowledge of General Education information will improve Description: Including pain rating scale, medication(s)/side effects and non-pharmacologic comfort measures Outcome: Progressing   Problem: Health Behavior/Discharge Planning: Goal: Ability to manage health-related needs will improve Outcome: Progressing   Problem: Clinical Measurements: Goal: Diagnostic test results will improve Outcome: Progressing Goal: Respiratory complications will improve Outcome: Progressing   Problem: Elimination: Goal: Will not experience complications related to bowel motility Outcome: Progressing Goal: Will not experience complications related to urinary retention Outcome: Progressing   Problem: Pain Managment: Goal: General experience of comfort will improve Outcome: Progressing   Problem: Education: Goal: Knowledge of disease or condition will improve Outcome: Progressing

## 2023-01-03 NOTE — H&P (Signed)
History and Physical    Bruce Woods [id number] DOB: April 30, 1961 DOA: 01/02/2023  DOS: the patient was seen and examined on 01/02/2023  PCP: [place], Blondell Reveal, MD   Patient coming from: Home  I have personally briefly reviewed patient's old medical records in [place]  CC: double vision, word finding HPI: 62 year old male history of hypertension, erectile dysfunction, HIV, sarcoidosis, anxiety, presented to the ED with sudden onset of blurred vision, gait instability, word finding.  Last known well was approximately 1900 hrs. on 01/02/2023.  Patient states that he ate finished dinner.  He stood up suddenly very dizzy.  Confused.  Was having trouble with word finding.  He felt that he was about to pass out.  His wife started checking his blood pressure at home systolic was above 99991111.  His wife drove him to  to [place] ER.  On the way to the ER, patient noted that the road lines were crisscrossing.  He was seeing doubles of cars.  He Became very nauseated.  On arrival to the ER, code stroke was called.  CT head was performed.  Negative acute findings.  But this time his blurred vision had resolved.  Thrombolytics were not given.  Teleneurology was consulted.  [facility] hospitalist contacted for admission.  Pt works as an Therapist, sports at [employer]   ED Course: CT head negative. Code stroke canceled.  Review of Systems:  Review of Systems  Constitutional: Negative.   HENT: Negative.    Eyes:  Positive for double vision.  Respiratory: Negative.    Cardiovascular: Negative.   Gastrointestinal:  Positive for nausea.  Genitourinary: Negative.   Musculoskeletal: Negative.   Skin: Negative.   Neurological:  Positive for speech change, weakness and headaches.       +word finding  Endo/Heme/Allergies: Negative.   Psychiatric/Behavioral:  The patient is nervous/anxious.   All other systems reviewed and are negative.   Past Medical History:  Diagnosis Date   Angio-edema  suspected of small bowel question due to amlodipine [id number]   Complication of anesthesia 07/28/2022   oxygen desaturations to 60's during EGD, bronchospasm after scope removed, reported being told he has suspected undiagnosed OSA   COVID-19 07/2021   DEPRESSION    Gastritis and gastroduodenitis    GERD (gastroesophageal reflux disease)    History of hiatal hernia    HIV DISEASE    Hypertension    IBS (irritable bowel syndrome)    Sarcoidosis 2000   question of   Ulnar nerve compression, right     Past Surgical History:  Procedure Laterality Date   COLONOSCOPY  2010   ESOPHAGOGASTRODUODENOSCOPY  2002   LUNG SURGERY  10/31/1998   vats   TONSILLECTOMY AND ADENOIDECTOMY     ULNAR NERVE TRANSPOSITION Right 05/17/2018   Procedure: RIGHT ULNAR NERVE DECOMPRESSION/TRANSPOSITION;  Surgeon: Leanora Cover, MD;  Location: [place];  Service: Orthopedics;  Laterality: Right;   XI ROBOTIC ASSISTED HIATAL HERNIA REPAIR N/A 09/13/2022   Procedure: XI ROBOTIC ASSISTED HIATAL HERNIA REPAIR WITH FUNDOPLICATION WITH MESH;  Surgeon: Ralene Ok, MD;  Location: [place];  Service: General;  Laterality: N/A;     reports that he quit smoking about 39 years ago. His smoking use included cigarettes. He has a 2.00 pack-year smoking history. He has never used smokeless tobacco. He reports current alcohol use of about 1.0 standard drink of alcohol per week. He reports that he does not use drugs.  Allergies  Allergen Reactions  Amlodipine Besylate Swelling    Suspected angioedema of bowel   Cepacol [Benzocaine-Menthol] Anaphylaxis   Morphine Anaphylaxis and Other (See Comments)    Resp distress, rash, tachycardia   Vibramycin [Doxycycline] Nausea And Vomiting   Celebrex [Celecoxib] Other (See Comments)    Elevated LFT's   Wellbutrin [Bupropion] Hives    Family History  Problem Relation Age of Onset   Hyperlipidemia Mother    Hypertension Mother    Cancer Mother         glioblastoma   Sudden death Neg Hx    Heart attack Neg Hx    Diabetes Neg Hx    Colon cancer Neg Hx    Prostate cancer Neg Hx     Prior to Admission medications   Medication Sig Start Date End Date Taking? Authorizing Provider  anastrozole (ARIMIDEX) 1 MG tablet Take 1 tablet by mouth once weekly 04/28/22     anastrozole (ARIMIDEX) 1 MG tablet Take 1 tablet (1 mg total) by mouth once a week. Patient taking differently: Take 1 mg by mouth every Friday. 07/07/22     bictegravir-emtricitabine-tenofovir AF (BIKTARVY) 50-200-25 MG TABS tablet Take 1 tablet by mouth daily. 10/05/22   Michel Bickers, MD  citalopram (CELEXA) 20 MG tablet Take 1 tablet (20 mg total) by mouth daily. 02/09/22   Colon Branch, MD  esomeprazole (NEXIUM) 40 MG capsule Take 1 capsule (40 mg total) by mouth 2 (two) times daily before a meal. Patient not taking: Reported on 08/30/2022 08/03/22   Cirigliano, Vito V, DO  influenza vac split quadrivalent PF (FLUARIX QUADRIVALENT) 0.5 ML injection Inject into the muscle. 07/19/22   Carlyle Basques, MD  losartan (COZAAR) 25 MG tablet Take 1 tablet (25 mg total) by mouth daily. 12/12/22   Chalmers Guest, FNP  methocarbamol (ROBAXIN) 500 MG tablet Take 500 mg by mouth daily as needed (hip pain.).    [provider]  methocarbamol (ROBAXIN-750) 750 MG tablet Take 1 tablet (750 mg total) by mouth 4 (four) times daily. 09/14/22   Stechschulte, Nickola Major, MD  ondansetron (ZOFRAN-ODT) 4 MG disintegrating tablet Take 1 tablet (4 mg total) by mouth every 8 (eight) hours as needed for nausea or vomiting. Patient taking differently: Take 4 mg by mouth in the morning, at noon, and at bedtime. 05/30/22   Isla Pence, MD  pantoprazole (PROTONIX) 40 MG tablet Take 1 tablet (40 mg total) by mouth 2 (two) times daily. Patient not taking: Reported on 10/05/2022 07/18/22   Gatha Mayer, MD  PRESCRIPTION MEDICATION Take 1 tablet by mouth daily as needed (pain.). Patient takes his wife FLEXERIL (dose  unknown) daily if needed for pain.    [provider]  tadalafil (CIALIS) 5 MG tablet Take 1 tablet by mouth once daily 03/04/22     testosterone cypionate (DEPOTESTOSTERONE CYPIONATE) 200 MG/ML injection Inject 0.5 mLs (100 mg total) into the muscle once a week. Patient taking differently: Inject 100 mg into the muscle every Friday. 07/07/22     traMADol (ULTRAM) 50 MG tablet Take 1 tablet (50 mg total) by mouth every 6 (six) hours as needed. 09/14/22 09/14/23  Stechschulte, Nickola Major, MD  zolpidem (AMBIEN) 10 MG tablet TAKE 1 TABLET BY MOUTH EVERY NIGHT AT BEDTIME AS NEEDED FOR SLEEP Patient taking differently: Take 10 mg by mouth at bedtime as needed for sleep. 10/26/21 09/13/22  Colon Branch, MD    Physical Exam: Vitals:   01/02/23 2015 01/02/23 2300 01/02/23 2315 01/03/23 0011  BP: Marland Kitchen)  152/88  (!) 188/95 (!) 182/93  Pulse: 81  64 (!) 54  Resp: '12 17 20 16  '$ Temp:    97.8 F (36.6 C)  TempSrc:    Oral  SpO2: 98%  99% 97%  Weight:      Height:        Physical Exam Vitals and nursing note reviewed.  Constitutional:      General: He is not in acute distress.    Appearance: Normal appearance. He is not ill-appearing, toxic-appearing or diaphoretic.  HENT:     Head: Normocephalic and atraumatic.     Nose: Nose normal.  Eyes:     General: No scleral icterus. Cardiovascular:     Rate and Rhythm: Normal rate and regular rhythm.     Pulses: Normal pulses.  Pulmonary:     Effort: Pulmonary effort is normal.  Abdominal:     General: Abdomen is flat. Bowel sounds are normal. There is no distension.     Palpations: Abdomen is soft.     Tenderness: There is no abdominal tenderness.  Musculoskeletal:     Right lower leg: No edema.     Left lower leg: No edema.  Skin:    General: Skin is warm and dry.     Capillary Refill: Capillary refill takes less than 2 seconds.  Neurological:     General: No focal deficit present.     Mental Status: He is alert and oriented to person,  place, and time.     Cranial Nerves: Cranial nerves 2-12 are intact.     Sensory: Sensation is intact.     Motor: Motor function is intact.     Coordination: Finger-Nose-Finger Test normal.      Labs on Admission: I have personally reviewed following labs and imaging studies  CBC: Recent Labs  Lab 01/02/23 1947 01/02/23 1957  WBC 7.3  --   NEUTROABS  --  4.6  HGB 16.8  --   HCT 49.8  --   MCV 88.5  --   PLT 189  --    Basic Metabolic Panel: Recent Labs  Lab 01/02/23 1947  NA 137  K 3.3*  CL 103  CO2 27  GLUCOSE 82  BUN 10  CREATININE 1.38*  CALCIUM 8.7*   GFR: Estimated Creatinine Clearance: 53 mL/min (A) (by C-G formula based on SCr of 1.38 mg/dL (H)). Liver Function Tests: Recent Labs  Lab 01/02/23 1957  AST 20  ALT 13  ALKPHOS 74  BILITOT 0.8  PROT 7.9  ALBUMIN 4.3   No results for input(s): "LIPASE", "AMYLASE" in the last 168 hours. No results for input(s): "AMMONIA" in the last 168 hours. Coagulation Profile: Recent Labs  Lab 01/02/23 1957  INR 1.1   Cardiac Enzymes: Recent Labs  Lab 01/02/23 1947 01/02/23 2154  TROPONINIHS 4 4   BNP (last 3 results) No results for input(s): "BNP" in the last 8760 hours. HbA1C: No results for input(s): "HGBA1C" in the last 72 hours. CBG: Recent Labs  Lab 01/02/23 2002  GLUCAP 134*   Lipid Profile: No results for input(s): "CHOL", "HDL", "LDLCALC", "TRIG", "CHOLHDL", "LDLDIRECT" in the last 72 hours. Thyroid Function Tests: No results for input(s): "TSH", "T4TOTAL", "FREET4", "T3FREE", "THYROIDAB" in the last 72 hours. Anemia Panel: No results for input(s): "VITAMINB12", "FOLATE", "FERRITIN", "TIBC", "IRON", "RETICCTPCT" in the last 72 hours. Urine analysis:    Component Value Date/Time   COLORURINE YELLOW 01/02/2023 [place] 01/02/2023 1947   LABSPEC 1.020 01/02/2023  [place] 6.5 01/02/2023 [facility] 01/02/2023 1947   GLUCOSEU NEG mg/dL 02/19/2007 2109    HGBUR NEGATIVE 01/02/2023 1947   [place] 01/02/2023 1947   KETONESUR NEGATIVE 01/02/2023 1947   PROTEINUR NEGATIVE 01/02/2023 1947   UROBILINOGEN 0.2 02/19/2007 2109   NITRITE NEGATIVE 01/02/2023 1947   LEUKOCYTESUR NEGATIVE 01/02/2023 1947    Radiological Exams on Admission: I have personally reviewed images DG Chest Port 1 View  Result Date: 01/02/2023 CLINICAL DATA:  Altered mental status EXAM: PORTABLE CHEST 1 VIEW COMPARISON:  05/30/2022 FINDINGS: The heart size and mediastinal contours are within normal limits. Both lungs are clear. The visualized skeletal structures are unremarkable. IMPRESSION: No active disease. Electronically Signed   By: Inez Catalina M.D.   On: 01/02/2023 20:59   CT HEAD CODE STROKE WO CONTRAST  Result Date: 01/02/2023 CLINICAL DATA:  Code stroke. Confusion, unsteady gait, nausea, near syncope EXAM: CT HEAD WITHOUT CONTRAST TECHNIQUE: Contiguous axial images were obtained from the base of the skull through the vertex without intravenous contrast. RADIATION DOSE REDUCTION: This exam was performed according to the departmental dose-optimization program which includes automated exposure control, adjustment of the mA and/or kV according to patient size and/or use of iterative reconstruction technique. COMPARISON:  None Available. FINDINGS: Brain: No evidence of acute infarction, hemorrhage, mass, mass effect, or midline shift. No hydrocephalus or extra-axial collection. Vascular: No hyperdense vessel. Atherosclerotic calcifications in the intracranial carotid and vertebral arteries. Skull: Negative for fracture or focal lesion. Sinuses/Orbits: Mild mucosal thickening in the maxillary sinuses and ethmoid air cells. No acute finding in the orbits. Other: The mastoid air cells are well aerated. ASPECTS [facility] Stroke Program Early CT Score) - Ganglionic level infarction (caudate, lentiform nuclei, internal capsule, insula, M1-M3 cortex): 7 - Supraganglionic infarction  (M4-M6 cortex): 3 Total score (0-10 with 10 being normal): 10 IMPRESSION: 1. No acute intracranial process. 2. ASPECTS is 10. Code stroke imaging results were communicated on 01/02/2023 at 8:15 pm to provider G[place]ON via telephone, who verbally acknowledged these results. Electronically Signed   By: Merilyn Baba M.D.   On: 01/02/2023 20:16    EKG: My personal interpretation of EKG shows: NSR    Assessment/Plan Principal Problem:   TIA (transient ischemic attack) Active Problems:   Human immunodeficiency virus (HIV) disease (HCC)   Hypertension, essential   Hypokalemia    Assessment and Plan: * TIA (transient ischemic attack) Observation telemetry bed. TIA/CVA workup with MRI/MRA head/neck. IV valium for sedation for MRI.  Pt with dizziness with turning his head up/left and up/right. ??vertebrobasilar insufficiency?? Check bubble echo. Pt is on IM testosterone and po arimidex for his erectile dysfunction. Continue with plavix 75 mg and asa 81 mg daily per neurology.  Hypokalemia Repeat BMP in AM. Repleted with po kcl.  Hypertension, essential Currently allowing for permissive HTN. Keep BP <220/110  Human immunodeficiency virus (HIV) disease ([place]) Well controlled.  Continue biktarvy   DVT prophylaxis: SQ Heparin Code Status: Full Code Family Communication: no family at bedside  Disposition Plan: return home  Consults called: EDP has consulted neurohospitalist  Admission status: Observation, Telemetry bed   Kristopher Oppenheim, DO [facility] 01/03/2023, 1:56 AM

## 2023-01-03 NOTE — Subjective & Objective (Addendum)
CC: double vision, word finding HPI: 62 year old male history of hypertension, erectile dysfunction, HIV, sarcoidosis, anxiety, presented to the ED with sudden onset of blurred vision, gait instability, word finding.  Last known well was approximately 1900 hrs. on 01/02/2023.  Patient states that he ate finished dinner.  He stood up suddenly very dizzy.  Confused.  Was having trouble with word finding.  He felt that he was about to pass out.  His wife started checking his blood pressure at home systolic was above 99991111.  His wife drove him to  to [place] ER.  On the way to the ER, patient noted that the road lines were crisscrossing.  He was seeing doubles of cars.  He Became very nauseated.  On arrival to the ER, code stroke was called.  CT head was performed.  Negative acute findings.  But this time his blurred vision had resolved.  Thrombolytics were not given.  Teleneurology was consulted.  [facility] hospitalist contacted for admission.  Pt works as an Therapist, sports at [employer]

## 2023-01-03 NOTE — Assessment & Plan Note (Signed)
Currently allowing for permissive HTN. Keep BP <220/110

## 2023-01-03 NOTE — Progress Notes (Signed)
PT Cancellation Note  Patient Details Name: Bruce Woods MRN: [id number] DOB: [birth year]   Cancelled Treatment:    Reason Eval/Treat Not Completed: PT screened, no needs identified, will sign off Pt reports resolution of symptoms and steady gait when ambulating to the bathroom.  Wyona Almas, PT, DPT Acute Rehabilitation Services Office [phone]    Deno Etienne 01/03/2023, 9:14 AM

## 2023-01-04 ENCOUNTER — Telehealth (HOSPITAL_BASED_OUTPATIENT_CLINIC_OR_DEPARTMENT_OTHER): Payer: Self-pay

## 2023-01-04 ENCOUNTER — Other Ambulatory Visit (HOSPITAL_BASED_OUTPATIENT_CLINIC_OR_DEPARTMENT_OTHER): Payer: Self-pay

## 2023-01-04 MED ORDER — TESTOSTERONE CYPIONATE 200 MG/ML IM SOLN
100.0000 mg | INTRAMUSCULAR | 0 refills | Status: DC
  Filled 2023-01-04: qty 2, 28d supply, fill #0
  Filled 2023-01-31: qty 10, 140d supply, fill #1
  Filled 2023-02-04: qty 6, 84d supply, fill #1
  Filled 2023-05-07: qty 6, 84d supply, fill #2

## 2023-01-04 NOTE — Transitions of Care (Post Inpatient/ED Visit) (Signed)
   01/04/2023  Name: Bruce Woods MRN: [id number] DOB: 02/25/1961  Today's TOC FU Call Status: Today's TOC FU Call Status:: Successful TOC FU Call Competed TOC FU Call Complete Date: 01/04/23  Transition Care Management Follow-up Telephone Call Date of Discharge: 01/03/23 Discharge Facility: [facility]) Type of Discharge: Inpatient Admission Primary Inpatient Discharge Diagnosis:: TIA (transient ischemic attack) How have you been since you were released from the hospital?: Better Any questions or concerns?: No  Items Reviewed: Did you receive and understand the discharge instructions provided?: Yes Medications obtained and verified?: Yes (Medications Reviewed) Any new allergies since your discharge?: No Dietary orders reviewed?: Yes Do you have support at home?: Yes  Home Care and Equipment/Supplies: [place] Ordered?: No Any new equipment or medical supplies ordered?: No  Functional Questionnaire: Do you need assistance with bathing/showering or dressing?: No Do you need assistance with meal preparation?: No Do you need assistance with eating?: No Do you have difficulty maintaining continence: No Do you need assistance with getting out of bed/getting out of a chair/moving?: No Do you have difficulty managing or taking your medications?: No  Folllow up appointments reviewed: PCP Follow-up appointment confirmed?: Yes Date of PCP follow-up appointment?: 01/16/23 Follow-up Provider: [facility] Follow-up appointment confirmed?: No Do you need transportation to your follow-up appointment?: No Do you understand care options if your condition(s) worsen?: Yes-patient verbalized understanding    [place] LPN [place] [phone]

## 2023-01-10 ENCOUNTER — Other Ambulatory Visit (HOSPITAL_BASED_OUTPATIENT_CLINIC_OR_DEPARTMENT_OTHER): Payer: Self-pay

## 2023-01-16 ENCOUNTER — Encounter (HOSPITAL_BASED_OUTPATIENT_CLINIC_OR_DEPARTMENT_OTHER): Payer: Self-pay | Admitting: Family Medicine

## 2023-01-16 ENCOUNTER — Other Ambulatory Visit (HOSPITAL_BASED_OUTPATIENT_CLINIC_OR_DEPARTMENT_OTHER): Payer: Self-pay

## 2023-01-16 ENCOUNTER — Ambulatory Visit (HOSPITAL_BASED_OUTPATIENT_CLINIC_OR_DEPARTMENT_OTHER): Payer: Commercial Managed Care - PPO | Admitting: Family Medicine

## 2023-01-16 VITALS — BP 154/84 | HR 57 | Ht 68.0 in | Wt 179.0 lb

## 2023-01-16 DIAGNOSIS — I10 Essential (primary) hypertension: Secondary | ICD-10-CM

## 2023-01-16 MED ORDER — ZOLPIDEM TARTRATE 5 MG PO TABS
5.0000 mg | ORAL_TABLET | Freq: Every evening | ORAL | 1 refills | Status: DC | PRN
  Filled 2023-01-16: qty 30, 30d supply, fill #0
  Filled 2023-06-12: qty 30, 30d supply, fill #1

## 2023-01-16 MED ORDER — METHOCARBAMOL 750 MG PO TABS
750.0000 mg | ORAL_TABLET | Freq: Four times a day (QID) | ORAL | 0 refills | Status: DC
  Filled 2023-01-16: qty 30, 8d supply, fill #0

## 2023-01-16 NOTE — Progress Notes (Signed)
    Procedures performed today:    None.  Independent interpretation of notes and tests performed by another provider:   None.  Brief History, Exam, Impression, and Recommendations:    BP (!) 154/84 (BP Location: Left Arm, Patient Position: Sitting, Cuff Size: Normal)   Pulse (!) 57   Ht 5\' 8"  (1.727 m)   Wt 179 lb (81.2 kg)   SpO2 100%   BMI 27.22 kg/m   Patient with recent hospital admission pertaining to TIA.  He primarily began to feel very dizzy, confused, trouble with word finding.  He presented to [facility] emergency department.  Code stroke was called and CT of the head was performed.  No acute findings were seen with CT scan.  Symptoms began to resolve on their own.  He did not receive any thrombolytics.  Teleneurology was consulted.  Patient continued to have improvement.  He ultimately was discharged on statin therapy, aspirin, Plavix.  Instructed to take Plavix for 3 weeks and then discontinue.  He was also instructed to follow-up with neurology as an outpatient, has not scheduled this as of yet.  Hypertension, essential Patient reports that he has been checking blood pressure at home which often and systolic readings will be around 130-140.  Currently slightly elevated in office today.  Continues with losartan, denies any issues with medication. Will need to continue to monitor closely and ensure appropriate blood pressure control moving forward.  May need to consider increasing dose of losartan from 25 mg to 50 mg.  Can discuss further at follow-up visits Recommend intermittent monitoring of blood pressure at home, DASH diet  Recommend continuing with medication recommendations in regards to discontinuing Plavix after 3 weeks, continuing aspirin after that point.  Continue with statin therapy at this time Recommend contacting neurology office in order to arrange follow-up with him as well.  Plan for follow-up in about 3 months for previously scheduled physical  or sooner as needed  Spent 36 minutes on this patient encounter, including preparation, chart review, face-to-face counseling with patient and coordination of care, and documentation of encounter   ___________________________________________ Charron Coultas de [place], MD, ABFM, CAQSM [facility] [place]

## 2023-01-16 NOTE — Assessment & Plan Note (Signed)
Patient reports that he has been checking blood pressure at home which often and systolic readings will be around 130-140.  Currently slightly elevated in office today.  Continues with losartan, denies any issues with medication. Will need to continue to monitor closely and ensure appropriate blood pressure control moving forward.  May need to consider increasing dose of losartan from 25 mg to 50 mg.  Can discuss further at follow-up visits Recommend intermittent monitoring of blood pressure at home, DASH diet

## 2023-01-23 ENCOUNTER — Encounter (HOSPITAL_BASED_OUTPATIENT_CLINIC_OR_DEPARTMENT_OTHER): Payer: Commercial Managed Care - PPO | Admitting: Family Medicine

## 2023-01-24 ENCOUNTER — Ambulatory Visit: Payer: Commercial Managed Care - PPO | Admitting: Behavioral Health

## 2023-01-26 ENCOUNTER — Encounter: Payer: Self-pay | Admitting: Neurology

## 2023-01-26 ENCOUNTER — Ambulatory Visit: Payer: Commercial Managed Care - PPO | Admitting: Neurology

## 2023-01-26 ENCOUNTER — Telehealth: Payer: Self-pay | Admitting: Neurology

## 2023-01-26 VITALS — BP 151/76 | HR 58 | Ht 68.0 in | Wt 179.0 lb

## 2023-01-26 DIAGNOSIS — G459 Transient cerebral ischemic attack, unspecified: Secondary | ICD-10-CM

## 2023-01-26 DIAGNOSIS — F419 Anxiety disorder, unspecified: Secondary | ICD-10-CM

## 2023-01-26 NOTE — Progress Notes (Signed)
Patient: Bruce Woods Date of Birth: [birth year]  Reason for Visit: Follow up for TIA History from: Patient, wife  Primary Neurologist: Dr. Leonie Man (Saw Dr. Erlinda Hong)  ASSESSMENT AND PLAN 62 y.o. year old male   34.  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  Hypertension 3.  Hyperlipidemia 4.  HIV  -Repeat MRI of the brain to rule out recent stroke, few days ago episode of driving with confusion, went the wrong way, claims objects were on the opposite side, no vision change, unclear etiology, potentially some stress component, switching jobs  -Continue aspirin 81 mg daily, Crestor 20 mg daily -Keep BP less than 130/90, LDL less than 70, A1c less than 7.0 for stroke prevention -Go to ER for any stroke like symptoms -Follow up here as needed, keep close follow-up with PCP  HISTORY OF PRESENT ILLNESS: Today 01/26/23 Darliss Cheney here for stroke clinic follow-up.  Presented to the ER 01/02/2023 with blurred vision/double vision, gait instability, word finding trouble after dinner, was normal day. Was leaning to the left. BP was elevated 170/100. Symptoms concerning for posterior TIA, MRI brain finding of subacute left frontal white matter infarct could be incidental.  Went ahead and started DAPT, then aspirin, added Crestor 20.  He works as a Health visitor, is stressful.  History of HIV under good control on Biktarvy. Now just on aspirin 81 mg daily and taking Crestor. Last week 01/17/23 felt lightheaded, had double vision, cleared went home. 01/22/23 got lost, everything was on the opposite side of the road, his vision was fine. He took the wrong turn, instead of going to [employer] he went to [place]. He knew what was going on. Just finished week 3 of new job in PACU at [employer], he admits to anxiety. BP up 151/76 today, PCP monitoring, on Losartan. We reviewed how much stress he was under at the time of initial event, RN 3.  -Code stroke CT head no acute findings -MRI brain  small areas of abnormal signal in bilateral frontal lobe white matter, nonspecific.  Anteriorly on the left diffusion is mildly abnormal and could be subacute white matter infarct.  Alternatively, encephalitis would be difficult to exclude in the setting of HIV. -MRA head and neck intracranial atherosclerosis limited to the ICA siphons -2D echo EF 60 to 65%, no PFO -LDL 112 -A1c 5.4 -No antithrombotic prior to admission, 3 weeks DAPT aspirin 81 mg daily and Plavix 75 mg daily, then aspirin alone -Referral to outpatient vestibular PT  HISTORY  01/03/23 Dr. Erlinda Hong History of Present Illness:  Bruce Woods is an 62 y.o. Caucasian male with PMH of hypertension, erectile dysfunction, HIV, sarcoidosis, anxiety, presented to the ED with sudden onset of blurred vision, gait instability, word finding.  He had finished eating dinner at home, started up and had to grab a hold of the eyelid because he became dizzy and lightheaded.  He was having word finding difficulties and felt like he was going to pass out.  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  She checked his blood pressure and his systolic was elevated.  They live approximately 1 mile from [facility] and she drove him to the ER.  On the way to the ER he felt like he kept leaning to the left and noted that he was having double vision and it looked like the road lines were zigzagging.  Additionally he became very nauseous.  He tells me that he has been  having trouble when he turns his head to the side too fast or when he changes positions fast with becoming lightheaded which has been going on for a couple of weeks.   Date last known well: Date: 01/02/2023 Time last known well: Time: 19:00 tPA Given: No:  MRS:  0 NIHSS:  0  REVIEW OF SYSTEMS: Out of a complete 14 system review of symptoms, the patient complains only of the following symptoms, and all other reviewed systems are negative.  See HPI  ALLERGIES: Allergies  Allergen Reactions    Amlodipine Besylate Swelling    Suspected angioedema of bowel   Cepacol [Benzocaine-Menthol] Anaphylaxis   Morphine Anaphylaxis and Other (See Comments)    Resp distress, rash, tachycardia   Vibramycin [Doxycycline] Nausea And Vomiting   Celebrex [Celecoxib] Other (See Comments)    Elevated LFT's   Wellbutrin [Bupropion] Hives    HOME MEDICATIONS: Outpatient Medications Prior to Visit  Medication Sig Dispense Refill   anastrozole (ARIMIDEX) 1 MG tablet Take 1 tablet (1 mg total) by mouth once a week. (Patient taking differently: Take 1 mg by mouth every Friday.) 20 tablet 1   aspirin EC 81 MG tablet Take 1 tablet (81 mg total) by mouth daily. Swallow whole. 120 tablet 12   bictegravir-emtricitabine-tenofovir AF (BIKTARVY) 50-200-25 MG TABS tablet Take 1 tablet by mouth daily. 30 tablet 11   citalopram (CELEXA) 20 MG tablet Take 1 tablet (20 mg total) by mouth daily. 90 tablet 3   losartan (COZAAR) 25 MG tablet Take 1 tablet (25 mg total) by mouth daily. 30 tablet 1   methocarbamol (ROBAXIN-750) 750 MG tablet Take 1 tablet (750 mg total) by mouth 4 (four) times daily. 30 tablet 0   rosuvastatin (CRESTOR) 20 MG tablet Take 1 tablet (20 mg total) by mouth daily. 30 tablet 2   tadalafil (CIALIS) 5 MG tablet Take 1 tablet by mouth once daily 90 tablet 3   testosterone cypionate (DEPOTESTOSTERONE CYPIONATE) 200 MG/ML injection Inject 0.5 mLs (100 mg total) into the muscle once a week. 10 mL 0   zolpidem (AMBIEN) 5 MG tablet Take 1 tablet (5 mg total) by mouth at bedtime as needed for sleep. 30 tablet 1   Facility-Administered Medications Prior to Visit  Medication Dose Route Frequency Provider Last Rate Last Admin   ondansetron (ZOFRAN) 4 mg in sodium chloride 0.9 % 50 mL IVPB  4 mg Intravenous Once Cirigliano, Vito V, DO        PAST MEDICAL HISTORY: Past Medical History:  Diagnosis Date   Angio-edema suspected of small bowel question due to amlodipine [id number]   Complication of  anesthesia 07/28/2022   oxygen desaturations to 60's during EGD, bronchospasm after scope removed, reported being told he has suspected undiagnosed OSA   COVID-19 07/2021   DEPRESSION    Gastritis and gastroduodenitis    GERD (gastroesophageal reflux disease)    History of hiatal hernia    HIV DISEASE    Hypertension    IBS (irritable bowel syndrome)    Sarcoidosis 2000   question of   Ulnar nerve compression, right     PAST SURGICAL HISTORY: Past Surgical History:  Procedure Laterality Date   COLONOSCOPY  2010   ESOPHAGOGASTRODUODENOSCOPY  2002   LUNG SURGERY  10/31/1998   vats   TONSILLECTOMY AND ADENOIDECTOMY     ULNAR NERVE TRANSPOSITION Right 05/17/2018   Procedure: RIGHT ULNAR NERVE DECOMPRESSION/TRANSPOSITION;  Surgeon: Leanora Cover, MD;  Location: [facility];  Service: Orthopedics;  Laterality: Right;   XI ROBOTIC ASSISTED HIATAL HERNIA REPAIR N/A 09/13/2022   Procedure: XI ROBOTIC ASSISTED HIATAL HERNIA REPAIR WITH FUNDOPLICATION WITH MESH;  Surgeon: Ralene Ok, MD;  Location: [facility];  Service: General;  Laterality: N/A;    FAMILY HISTORY: Family History  Problem Relation Age of Onset   Hyperlipidemia Mother    Hypertension Mother    Cancer Mother        glioblastoma   Sudden death Neg Hx    Heart attack Neg Hx    Diabetes Neg Hx    Colon cancer Neg Hx    Prostate cancer Neg Hx     SOCIAL HISTORY: Social History   Socioeconomic History   Marital status: Married    Spouse name: Not on file   Number of children: 2   Years of education: Not on file   Highest education level: Not on file  Occupational History   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: [employer]  Tobacco Use   Smoking status: Former    Packs/day: 1.00    Years: 2.00    Additional pack years: 0.00    Total pack years: 2.00    Types: Cigarettes    Quit date: 11/01/1983    Years since quitting: 39.2   Smokeless tobacco: Never  Vaping Use   Vaping Use:  Never used  Substance and Sexual Activity   Alcohol use: Yes    Alcohol/week: 1.0 standard drink of alcohol    Types: 1 Standard drinks or equivalent per week    Comment: socially    Drug use: No   Sexual activity: Yes    Comment: declined condoms  Other Topics Concern   Not on file  Social History Narrative   Married to [place], daughters [name]   RN PACU, ED   Former smoker no drug use occasional alcohol   Social Determinants of Health   Financial Resource Strain: Not on file  Food Insecurity: No Food Insecurity (01/03/2023)   Hunger Vital Sign    Worried About Running Out of Food in the Last Year: Never true    [place] in the Last Year: Never true  Transportation Needs: No Transportation Needs (01/03/2023)   PRAPARE - Hydrologist (Medical): No    Lack of Transportation (Non-Medical): No  Physical Activity: Not on file  Stress: Not on file  Social Connections: Not on file  Intimate Partner Violence: Not At Risk (01/03/2023)   Humiliation, Afraid, Rape, and Kick questionnaire    Fear of Current or Ex-Partner: No    Emotionally Abused: No    Physically Abused: No    Sexually Abused: No    PHYSICAL EXAM  Vitals:   01/26/23 1014  BP: (!) 151/76  Pulse: (!) 58  Weight: 179 lb (81.2 kg)  Height: 5\' 8"  (1.727 m)   Body mass index is 27.22 kg/m.  Generalized: Well developed, in no acute distress  Neurological examination  Mentation: Alert oriented to time, place, history taking. Follows all commands speech and language fluent Cranial nerve II-XII: Pupils were equal round reactive to light. Extraocular movements were full, visual field were full on confrontational test. Facial sensation and strength were normal. Head turning and shoulder shrug  were normal and symmetric. Motor: The motor testing reveals 5 over 5 strength of all 4 extremities. Good symmetric motor tone is noted throughout.  Sensory: Sensory testing is intact to  soft touch on all 4 extremities. No evidence of extinction is noted.  Coordination: Cerebellar testing reveals good finger-nose-finger and heel-to-shin bilaterally.  Gait and station: Gait is normal. Tandem gait is normal. Romberg is negative. No drift is seen.  Reflexes: Deep tendon reflexes are symmetric and normal bilaterally.   DIAGNOSTIC DATA (LABS, IMAGING, TESTING) - I reviewed patient records, labs, notes, testing and imaging myself where available.  Lab Results  Component Value Date   WBC 7.3 01/02/2023   HGB 16.8 01/02/2023   HCT 49.8 01/02/2023   MCV 88.5 01/02/2023   PLT 189 01/02/2023      Component Value Date/Time   NA 137 01/02/2023 1947   NA 142 12/14/2022 0819   K 3.3 (L) 01/02/2023 1947   CL 103 01/02/2023 1947   CO2 27 01/02/2023 1947   GLUCOSE 82 01/02/2023 1947   BUN 10 01/02/2023 1947   BUN 12 12/14/2022 0819   CREATININE 1.38 (H) 01/02/2023 1947   CREATININE 1.53 (H) 09/21/2022 0848   CALCIUM 8.7 (L) 01/02/2023 1947   PROT 7.9 01/02/2023 1957   PROT 6.7 12/14/2022 0819   ALBUMIN 4.3 01/02/2023 1957   ALBUMIN 4.4 12/14/2022 0819   AST 20 01/02/2023 1957   ALT 13 01/02/2023 1957   ALKPHOS 74 01/02/2023 1957   BILITOT 0.8 01/02/2023 1957   BILITOT 0.7 12/14/2022 0819   GFRNONAA 57 (L) 01/02/2023 1947   GFRNONAA 63 10/05/2018 0936   GFRAA >60 04/08/2019 1451   GFRAA 73 10/05/2018 0936   Lab Results  Component Value Date   CHOL 160 01/03/2023   HDL 29 (L) 01/03/2023   LDLCALC 112 (H) 01/03/2023   LDLDIRECT 115.0 01/07/2019   TRIG 97 01/03/2023   CHOLHDL 5.5 01/03/2023   Lab Results  Component Value Date   HGBA1C 5.4 12/14/2022   Lab Results  Component Value Date   [id number] 10/01/2013   Lab Results  Component Value Date   TSH 2.730 12/14/2022    Butler Denmark, AGNP-C, DNP 01/26/2023, 11:32 AM [facility] [address] [place], [place] [address] [phone]

## 2023-01-26 NOTE — Telephone Encounter (Signed)
Bruce Woods [facility] sent to GI [phone]

## 2023-01-26 NOTE — Progress Notes (Signed)
I agree with the above plan 

## 2023-01-26 NOTE — Patient Instructions (Addendum)
I will check MRI brain  BP goal < 130/90, LDL < 70, A1C < 7.0 Follow up with you once MRI results  Stay on aspirin 81 mg daily

## 2023-01-31 ENCOUNTER — Other Ambulatory Visit (HOSPITAL_COMMUNITY): Payer: Self-pay

## 2023-02-01 ENCOUNTER — Other Ambulatory Visit (HOSPITAL_BASED_OUTPATIENT_CLINIC_OR_DEPARTMENT_OTHER): Payer: Self-pay

## 2023-02-03 ENCOUNTER — Other Ambulatory Visit (HOSPITAL_COMMUNITY): Payer: Self-pay

## 2023-02-04 ENCOUNTER — Encounter (HOSPITAL_BASED_OUTPATIENT_CLINIC_OR_DEPARTMENT_OTHER): Payer: Self-pay | Admitting: Family Medicine

## 2023-02-04 ENCOUNTER — Other Ambulatory Visit: Payer: Self-pay | Admitting: Internal Medicine

## 2023-02-04 ENCOUNTER — Other Ambulatory Visit (HOSPITAL_BASED_OUTPATIENT_CLINIC_OR_DEPARTMENT_OTHER): Payer: Self-pay | Admitting: Family Medicine

## 2023-02-04 DIAGNOSIS — I10 Essential (primary) hypertension: Secondary | ICD-10-CM

## 2023-02-04 DIAGNOSIS — F3342 Major depressive disorder, recurrent, in full remission: Secondary | ICD-10-CM

## 2023-02-05 MED ORDER — CITALOPRAM HYDROBROMIDE 20 MG PO TABS
20.0000 mg | ORAL_TABLET | Freq: Every day | ORAL | 3 refills | Status: DC
  Filled 2023-02-05: qty 90, 90d supply, fill #0
  Filled 2023-04-17 – 2023-04-19 (×2): qty 90, 90d supply, fill #1
  Filled 2023-08-04: qty 90, 90d supply, fill #2
  Filled 2023-11-01: qty 90, 90d supply, fill #3

## 2023-02-06 ENCOUNTER — Other Ambulatory Visit: Payer: Self-pay

## 2023-02-06 ENCOUNTER — Other Ambulatory Visit (HOSPITAL_BASED_OUTPATIENT_CLINIC_OR_DEPARTMENT_OTHER): Payer: Self-pay

## 2023-02-07 ENCOUNTER — Ambulatory Visit (HOSPITAL_COMMUNITY)
Admission: RE | Admit: 2023-02-07 | Discharge: 2023-02-07 | Disposition: A | Discharge status: Home / Self Care | Payer: Commercial Managed Care - PPO | Source: Ambulatory Visit | Attending: Neurology | Admitting: Neurology

## 2023-02-07 ENCOUNTER — Other Ambulatory Visit (HOSPITAL_BASED_OUTPATIENT_CLINIC_OR_DEPARTMENT_OTHER): Payer: Self-pay

## 2023-02-07 DIAGNOSIS — G459 Transient cerebral ischemic attack, unspecified: Secondary | ICD-10-CM | POA: Insufficient documentation

## 2023-02-07 DIAGNOSIS — I639 Cerebral infarction, unspecified: Secondary | ICD-10-CM | POA: Diagnosis not present

## 2023-02-13 ENCOUNTER — Encounter: Payer: Self-pay | Admitting: *Deleted

## 2023-02-16 ENCOUNTER — Other Ambulatory Visit (HOSPITAL_BASED_OUTPATIENT_CLINIC_OR_DEPARTMENT_OTHER): Payer: Self-pay | Admitting: Family Medicine

## 2023-02-16 DIAGNOSIS — I10 Essential (primary) hypertension: Secondary | ICD-10-CM

## 2023-02-20 ENCOUNTER — Other Ambulatory Visit (HOSPITAL_BASED_OUTPATIENT_CLINIC_OR_DEPARTMENT_OTHER): Payer: Self-pay

## 2023-02-20 MED ORDER — LOSARTAN POTASSIUM 25 MG PO TABS
25.0000 mg | ORAL_TABLET | Freq: Every day | ORAL | 1 refills | Status: DC
  Filled 2023-02-20: qty 30, 30d supply, fill #0
  Filled 2023-03-18: qty 30, 30d supply, fill #1

## 2023-02-28 ENCOUNTER — Emergency Department (HOSPITAL_COMMUNITY): Payer: Commercial Managed Care - PPO

## 2023-02-28 ENCOUNTER — Emergency Department (HOSPITAL_COMMUNITY)
Admission: EM | Admit: 2023-02-28 | Discharge: 2023-02-28 | Disposition: A | Discharge status: Home / Self Care | Payer: Commercial Managed Care - PPO | Attending: Emergency Medicine | Admitting: Emergency Medicine

## 2023-02-28 ENCOUNTER — Other Ambulatory Visit: Payer: Self-pay

## 2023-02-28 DIAGNOSIS — R0789 Other chest pain: Secondary | ICD-10-CM

## 2023-02-28 DIAGNOSIS — Z79899 Other long term (current) drug therapy: Secondary | ICD-10-CM | POA: Insufficient documentation

## 2023-02-28 DIAGNOSIS — Z7982 Long term (current) use of aspirin: Secondary | ICD-10-CM | POA: Diagnosis not present

## 2023-02-28 DIAGNOSIS — J9811 Atelectasis: Secondary | ICD-10-CM | POA: Diagnosis not present

## 2023-02-28 DIAGNOSIS — Z21 Asymptomatic human immunodeficiency virus [HIV] infection status: Secondary | ICD-10-CM | POA: Diagnosis not present

## 2023-02-28 DIAGNOSIS — R079 Chest pain, unspecified: Secondary | ICD-10-CM | POA: Diagnosis not present

## 2023-02-28 LAB — CBC WITH DIFFERENTIAL/PLATELET
Abs Immature Granulocytes: 0.02 10*3/uL (ref 0.00–0.07)
Basophils Absolute: 0 10*3/uL (ref 0.0–0.1)
Basophils Relative: 0 %
Eosinophils Absolute: 0 10*3/uL (ref 0.0–0.5)
Eosinophils Relative: 0 %
HCT: 48.8 % (ref 39.0–52.0)
Hemoglobin: 16.2 g/dL (ref 13.0–17.0)
Immature Granulocytes: 0 %
Lymphocytes Relative: 30 %
Lymphs Abs: 1.8 10*3/uL (ref 0.7–4.0)
MCH: 30.5 pg (ref 26.0–34.0)
MCHC: 33.2 g/dL (ref 30.0–36.0)
MCV: 91.7 fL (ref 80.0–100.0)
Monocytes Absolute: 0.3 10*3/uL (ref 0.1–1.0)
Monocytes Relative: 5 %
Neutro Abs: 4 10*3/uL (ref 1.7–7.7)
Neutrophils Relative %: 65 %
Platelets: 159 10*3/uL (ref 150–400)
RBC: 5.32 MIL/uL (ref 4.22–5.81)
RDW: 14.5 % (ref 11.5–15.5)
WBC: 6.1 10*3/uL (ref 4.0–10.5)
nRBC: 0 % (ref 0.0–0.2)

## 2023-02-28 LAB — BASIC METABOLIC PANEL
Anion gap: 8 (ref 5–15)
BUN: 13 mg/dL (ref 8–23)
CO2: 28 mmol/L (ref 22–32)
Calcium: 9 mg/dL (ref 8.9–10.3)
Chloride: 102 mmol/L (ref 98–111)
Creatinine, Ser: 1.26 mg/dL — ABNORMAL HIGH (ref 0.61–1.24)
GFR, Estimated: >60 mL/min (ref >60)
Glucose, Bld: 157 mg/dL — ABNORMAL HIGH (ref 70–99)
Potassium: 3.3 mmol/L — ABNORMAL LOW (ref 3.5–5.1)
Sodium: 138 mmol/L (ref 135–145)

## 2023-02-28 LAB — LIPASE, BLOOD: Lipase: 32 U/L (ref 11–51)

## 2023-02-28 LAB — HEPATIC FUNCTION PANEL
ALT: 14 U/L (ref 0–44)
AST: 20 U/L (ref 15–41)
Albumin: 4.2 g/dL (ref 3.5–5.0)
Alkaline Phosphatase: 66 U/L (ref 38–126)
Bilirubin, Direct: 0.2 mg/dL (ref 0.0–0.2)
Indirect Bilirubin: 0.9 mg/dL (ref 0.3–0.9)
Total Bilirubin: 1.1 mg/dL (ref 0.3–1.2)
Total Protein: 7.4 g/dL (ref 6.5–8.1)

## 2023-02-28 LAB — TROPONIN I (HIGH SENSITIVITY)
Troponin I (High Sensitivity): 4 ng/L (ref <18)
Troponin I (High Sensitivity): 4 ng/L (ref <18)

## 2023-02-28 LAB — D-DIMER, QUANTITATIVE: D-Dimer, Quant: <0.27 ug/mL-FEU (ref 0.00–0.50)

## 2023-02-28 LAB — CBG MONITORING, ED: Glucose-Capillary: 144 mg/dL — ABNORMAL HIGH (ref 70–99)

## 2023-02-28 LAB — MAGNESIUM: Magnesium: 2.1 mg/dL (ref 1.7–2.4)

## 2023-02-28 MED ORDER — ASPIRIN 81 MG PO CHEW
324.0000 mg | CHEWABLE_TABLET | Freq: Once | ORAL | Status: AC
  Administered 2023-02-28: 324 mg via ORAL
  Filled 2023-02-28: qty 4

## 2023-02-28 MED ORDER — ALUM & MAG HYDROXIDE-SIMETH 200-200-20 MG/5ML PO SUSP
30.0000 mL | Freq: Once | ORAL | Status: AC
  Administered 2023-02-28: 30 mL via ORAL
  Filled 2023-02-28: qty 30

## 2023-02-28 MED ORDER — FENTANYL CITRATE PF 50 MCG/ML IJ SOSY
100.0000 ug | PREFILLED_SYRINGE | Freq: Once | INTRAMUSCULAR | Status: AC
  Administered 2023-02-28: 100 ug via INTRAVENOUS
  Filled 2023-02-28: qty 2

## 2023-02-28 MED ORDER — LIDOCAINE VISCOUS HCL 2 % MT SOLN
15.0000 mL | Freq: Once | OROMUCOSAL | Status: AC
  Administered 2023-02-28: 15 mL via ORAL
  Filled 2023-02-28: qty 15

## 2023-02-28 MED ORDER — FENTANYL CITRATE PF 50 MCG/ML IJ SOSY
50.0000 ug | PREFILLED_SYRINGE | Freq: Once | INTRAMUSCULAR | Status: AC
  Administered 2023-02-28: 50 ug via INTRAVENOUS
  Filled 2023-02-28: qty 1

## 2023-02-28 MED ORDER — POTASSIUM CHLORIDE CRYS ER 20 MEQ PO TBCR
40.0000 meq | EXTENDED_RELEASE_TABLET | Freq: Once | ORAL | Status: AC
  Administered 2023-02-28: 40 meq via ORAL
  Filled 2023-02-28: qty 2

## 2023-02-28 MED ORDER — FAMOTIDINE IN NACL 20-0.9 MG/50ML-% IV SOLN
20.0000 mg | Freq: Once | INTRAVENOUS | Status: AC
  Administered 2023-02-28: 20 mg via INTRAVENOUS
  Filled 2023-02-28: qty 50

## 2023-02-28 MED ORDER — ONDANSETRON HCL 4 MG/2ML IJ SOLN
4.0000 mg | Freq: Once | INTRAMUSCULAR | Status: AC
  Administered 2023-02-28: 4 mg via INTRAVENOUS
  Filled 2023-02-28: qty 2

## 2023-02-28 NOTE — Discharge Instructions (Signed)
A referral for a cardiology follow-up was ordered.  If you do not hear from their office in the next couple days, call the number below to set up that follow-up appointment.  Schedule follow-up with your GI doctor as well.  Return to the emergency department for any new or worsening symptoms of concern.

## 2023-02-28 NOTE — ED Triage Notes (Signed)
Pt to ED c/o chest pain since yesterday afternoon, worse this morning. 1 sublingual Nitro taken 1030

## 2023-02-28 NOTE — ED Provider Notes (Signed)
[facility] Provider Note   CSN: [id number] Arrival date & time: 02/28/23  8295     History  Chief Complaint  Patient presents with   Chest Pain    Bruce Woods is a 62 y.o. male.  HPI Patient presents for chest pain.  Medical history includes HIV, sarcoidosis, depression, anxiety, TIA, IBS, GERD, hiatal hernia s/p Nissen fundoplication.  Patient currently works as a Engineer, civil (consulting) and postoperative unit.  He did go to work yesterday.  When he returned home in the evening, he did feel fatigued.  He was restless overnight and did not fall asleep until 2 AM.  This morning, he felt well.  He went to work.  While at work, he experienced a central chest heaviness and shortness of breath.  He was provided with 1 SL NTG.  This did ease his chest pressure but also caused him to feel lightheaded and subsequently have a headache.  Currently, he has mild persistent chest pressure.  Discomfort does not radiate.  He denies any nausea.  Patient was last seen in cardiology office 5 years ago.  At that time, he had a negative exercise stress test.  He has not had any further cardiac workup.  He did have a TIA 1.5 months ago.  He underwent an echocardiogram as part of his TIA workup.  He is on 1 daily 81 mg aspirin.  Last aspirin dose was last night.  Patient's risk factors for ACS include HTN, and HLD.  He does not have any known family history of early ACS.    Home Medications Prior to Admission medications   Medication Sig Start Date End Date Taking? Authorizing Provider  anastrozole (ARIMIDEX) 1 MG tablet Take 1 tablet (1 mg total) by mouth once a week. Patient taking differently: Take 1 mg by mouth every Friday. 07/07/22     aspirin EC 81 MG tablet Take 1 tablet (81 mg total) by mouth daily. Swallow whole. 01/04/23   Osvaldo Shipper, MD  bictegravir-emtricitabine-tenofovir AF (BIKTARVY) 50-200-25 MG TABS tablet Take 1 tablet by mouth daily. 10/05/22   Cliffton Asters, MD  citalopram (CELEXA) 20 MG tablet Take 1 tablet (20 mg total) by mouth daily. 02/05/23   Eulis Foster, FNP  losartan (COZAAR) 25 MG tablet Take 1 tablet (25 mg total) by mouth daily. 02/20/23   Novella Olive, FNP  methocarbamol (ROBAXIN-750) 750 MG tablet Take 1 tablet (750 mg total) by mouth 4 (four) times daily. 01/16/23   de [place], Buren Kos, MD  rosuvastatin (CRESTOR) 20 MG tablet Take 1 tablet (20 mg total) by mouth daily. 01/04/23   Osvaldo Shipper, MD  tadalafil (CIALIS) 5 MG tablet Take 1 tablet by mouth once daily 03/04/22     testosterone cypionate (DEPOTESTOSTERONE CYPIONATE) 200 MG/ML injection Inject 0.5 mLs (100 mg total) into the muscle once a week. 01/04/23     zolpidem (AMBIEN) 5 MG tablet Take 1 tablet (5 mg total) by mouth at bedtime as needed for sleep. 01/16/23   de [place], Raymond J, MD      Allergies    Amlodipine besylate, Cepacol [benzocaine-menthol], Morphine, Vibramycin [doxycycline], Celebrex [celecoxib], and Wellbutrin [bupropion]    Review of Systems   Review of Systems  Constitutional:  Positive for fatigue.  Respiratory:  Positive for chest tightness and shortness of breath.   Cardiovascular:  Positive for chest pain.  All other systems reviewed and are negative.   Physical Exam Updated Vital Signs BP Marland Kitchen)  151/86   Pulse (!) 52   Temp 97.9 F (36.6 C) (Oral)   Resp 14   Ht 5\' 8"  (1.727 m)   Wt 82 kg   SpO2 99%   BMI 27.49 kg/m  Physical Exam Vitals and nursing note reviewed.  Constitutional:      General: He is not in acute distress.    Appearance: Normal appearance. He is well-developed. He is not ill-appearing, toxic-appearing or diaphoretic.  HENT:     Head: Normocephalic and atraumatic.     Right Ear: External ear normal.     Left Ear: External ear normal.     Nose: Nose normal.     Mouth/Throat:     Mouth: Mucous membranes are moist.  Eyes:     Conjunctiva/sclera: Conjunctivae normal.  Cardiovascular:     Rate and Rhythm: Normal rate  and regular rhythm.     Heart sounds: No murmur heard. Pulmonary:     Effort: Pulmonary effort is normal. No respiratory distress.     Breath sounds: Normal breath sounds. No wheezing or rales.  Chest:     Chest wall: No tenderness.  Abdominal:     General: There is no distension.     Palpations: Abdomen is soft.     Tenderness: There is no abdominal tenderness.  Musculoskeletal:        General: No swelling. Normal range of motion.     Cervical back: Normal range of motion and neck supple.     Right lower leg: No edema.     Left lower leg: No edema.  Skin:    General: Skin is warm and dry.     Coloration: Skin is not jaundiced or pale.  Neurological:     General: No focal deficit present.     Mental Status: He is alert and oriented to person, place, and time.     Cranial Nerves: No cranial nerve deficit.     Sensory: No sensory deficit.     Motor: No weakness.     Coordination: Coordination normal.  Psychiatric:        Mood and Affect: Mood normal.        Behavior: Behavior normal.        Thought Content: Thought content normal.        Judgment: Judgment normal.     ED Results / Procedures / Treatments   Labs (all labs ordered are listed, but only abnormal results are displayed) Labs Reviewed  BASIC METABOLIC PANEL - Abnormal; Notable for the following components:      Result Value   Potassium 3.3 (*)    Glucose, Bld 157 (*)    Creatinine, Ser 1.26 (*)    All other components within normal limits  CBG MONITORING, ED - Abnormal; Notable for the following components:   Glucose-Capillary 144 (*)    All other components within normal limits  MAGNESIUM  LIPASE, BLOOD  HEPATIC FUNCTION PANEL  CBC WITH DIFFERENTIAL/PLATELET  D-DIMER, QUANTITATIVE  TROPONIN I (HIGH SENSITIVITY)  TROPONIN I (HIGH SENSITIVITY)    EKG EKG Interpretation  Date/Time:  Tuesday February 28 2023 12:29:46 EDT Ventricular Rate:  52 PR Interval:  220 QRS Duration: 91 QT Interval:  435 QTC  Calculation: 405 R Axis:   -9 Text Interpretation: Sinus rhythm Prolonged PR interval Confirmed by Gloris Manchester [phone]) on 02/28/2023 2:17:54 PM  Radiology DG Chest Portable 1 View  Result Date: 02/28/2023 CLINICAL DATA:  Chest pain. EXAM: PORTABLE CHEST 1 VIEW COMPARISON:  January 02, 2023. FINDINGS: The heart size and mediastinal contours are within normal limits. Hypoinflation of the lungs with minimal bibasilar subsegmental atelectasis. The visualized skeletal structures are unremarkable. IMPRESSION: Hypoinflation of the lungs with minimal bibasilar subsegmental atelectasis. Electronically Signed   By: Lupita Raider M.D.   On: 02/28/2023 10:10    Procedures Procedures    Medications Ordered in ED Medications  aspirin chewable tablet 324 mg (324 mg Oral Given 02/28/23 1008)  fentaNYL (SUBLIMAZE) injection 50 mcg (50 mcg Intravenous Given 02/28/23 1006)  fentaNYL (SUBLIMAZE) injection 100 mcg (100 mcg Intravenous Given 02/28/23 1102)  ondansetron (ZOFRAN) injection 4 mg (4 mg Intravenous Given 02/28/23 1101)  alum & mag hydroxide-simeth (MAALOX/MYLANTA) 200-200-20 MG/5ML suspension 30 mL (30 mLs Oral Given 02/28/23 1234)    And  lidocaine (XYLOCAINE) 2 % viscous mouth solution 15 mL (15 mLs Oral Given 02/28/23 1234)  famotidine (PEPCID) IVPB 20 mg premix (0 mg Intravenous Stopped 02/28/23 1302)  potassium chloride SA (KLOR-CON M) CR tablet 40 mEq (40 mEq Oral Given 02/28/23 1318)    ED Course/ Medical Decision Making/ A&P                             Medical Decision Making Amount and/or Complexity of Data Reviewed Labs: ordered. Radiology: ordered.  Risk OTC drugs. Prescription drug management.   This patient presents to the ED for concern of chest discomfort, this involves an extensive number of treatment options, and is a complaint that carries with it a high risk of complications and morbidity.  The differential diagnosis includes ACS, anxiety, GERD, PE, pneumonia   Co morbidities  that complicate the patient evaluation  HIV, sarcoidosis, depression, anxiety, TIA, IBS, GERD, hiatal hernia s/p Nissen fundoplication   Additional history obtained:  Additional history obtained from N/A External records from outside source obtained and reviewed including EMR   Lab Tests:  I Ordered, and personally interpreted labs.  The pertinent results include: Normal hemoglobin, no leukocytosis, mild hypokalemia with otherwise normal electrolytes, baseline creatinine, normal troponins x 2 D-dimer   Imaging Studies ordered:  I ordered imaging studies including chest x-ray I independently visualized and interpreted imaging which showed no acute findings I agree with the radiologist interpretation   Cardiac Monitoring: / EKG:  The patient was maintained on a cardiac monitor.  I personally viewed and interpreted the cardiac monitored which showed an underlying rhythm of: Sinus rhythm  Problem List / ED Course / Critical interventions / Medication management  Patient presents for chest pressure and shortness of breath.  Onset was shortly prior to arrival.  He was at work but not exerting himself.  Prior to the episode today, he was feeling increased fatigue yesterday evening.  Prior to arrival, he did take 1 SL NTG with alleviation of his chest pressure.  On arrival, symptoms are mild at this time.  EKG does not show any concerning ST segment changes.  Patient was given 324 of ASA.  Fentanyl was ordered for symptomatic relief.  Laboratory workup was initiated.  While in the ED, patient had recurrence of chest pain and additional fentanyl was ordered.  Initial troponin was normal.  On further discussion with the patient, he does state that he had a history of severe reflux.  He was fortunate that reflux symptoms resolved after his Nissen fundoplication which she underwent late last year.  He has some epigastric discomfort and belching in the ED.  Patient was given a  trial of GI cocktail.   After this, his epigastric discomfort worsened.  He remained on cardiac monitor.  Second troponin and D-dimer are reassuring.  After further discussion with the patient, he feels that symptoms are consistent with prior GI symptoms.  He will plan on following up with his GI doctor.  Given his risk factors, patient would also benefit from close cardiology follow-up.  Referral was ordered.  Patient was discharged in stable condition. I ordered medication including ASA for concern of ACS, fentanyl for analgesia; GI cocktail for empiric treatment of reflux Reevaluation of the patient after these medicines showed that the patient improved I have reviewed the patients home medicines and have made adjustments as needed   Social Determinants of Health:  Has access to outpatient care, high medical literacy         Final Clinical Impression(s) / ED Diagnoses Final diagnoses:  Chest pressure    Rx / DC Orders ED Discharge Orders          Ordered    Ambulatory referral to Cardiology       Comments: If you have not heard from the Cardiology office within the next 72 hours please call [phone].   02/28/23 1445              Gloris Manchester, MD 02/28/23 1446

## 2023-03-01 ENCOUNTER — Other Ambulatory Visit (HOSPITAL_COMMUNITY): Payer: Self-pay

## 2023-03-01 ENCOUNTER — Other Ambulatory Visit (HOSPITAL_BASED_OUTPATIENT_CLINIC_OR_DEPARTMENT_OTHER): Payer: Self-pay

## 2023-03-06 ENCOUNTER — Other Ambulatory Visit: Payer: Self-pay

## 2023-03-06 ENCOUNTER — Ambulatory Visit: Payer: Commercial Managed Care - PPO | Attending: Internal Medicine | Admitting: Internal Medicine

## 2023-03-06 ENCOUNTER — Encounter: Payer: Self-pay | Admitting: Internal Medicine

## 2023-03-06 ENCOUNTER — Other Ambulatory Visit (HOSPITAL_BASED_OUTPATIENT_CLINIC_OR_DEPARTMENT_OTHER): Payer: Self-pay

## 2023-03-06 VITALS — BP 140/82 | HR 61 | Ht 68.0 in | Wt 180.4 lb

## 2023-03-06 DIAGNOSIS — R072 Precordial pain: Secondary | ICD-10-CM | POA: Diagnosis not present

## 2023-03-06 MED ORDER — METOPROLOL TARTRATE 50 MG PO TABS
50.0000 mg | ORAL_TABLET | Freq: Once | ORAL | 0 refills | Status: DC
  Filled 2023-03-06: qty 1, 1d supply, fill #0

## 2023-03-06 NOTE — Progress Notes (Signed)
Cardiology Office Note:    Date:  03/06/2023   ID:  Bruce Woods, DOB [birth year], MRN [id number]  PCP:  de [place], Raymond J, MD   [facility] Providers Cardiologist:  Maisie Fus, MD     Referring MD: Gloris Manchester, MD   No chief complaint on file. CP  History of Present Illness:    Bruce Woods is a 62 y.o. male with a hx of GERD, HTN, HIV, sarcoidosis, TIA referral for chest pressure from the ED. EKG showed sinus rhythm, no ischemic changes. Troponin is negative. He had a TTE with bubble in March of 2024, that was normal.   He notes he felt off at work last Monday. He notes he got chest pressure suddenly. He felt like laying on the floor. He took a nitro and it subsided. He works here at [employer] and has been since the 1980s. He felt very exhausted for days. He had recurrent chest pain.  He had a TIA 01/03/2023, his brain MRI showed small areas of abnormal signal in the bilateral frontal lobe white matter, nonspecific. But anteriorly on the left diffusion is mildly abnormal and that could be a subacute white matter Infarct (series 5, image 88). Did DAPT for 21 days. Continued on asa. On crestor.   Prior echo, ETT, and ziopatch in 2018 were unremarkable. He was seen by Dr. Jens Som at this time c/o CP and presyncope/palpitations.  Stopped smoking in 1985  The 10-year ASCVD risk score (Arnett DK, et al., 2019) is: 18.6%   Values used to calculate the score:     Age: 68 years     Sex: Male     Is Non-Hispanic African American: No     Diabetic: No     Tobacco smoker: No     Systolic Blood Pressure: 140 mmHg     Is BP treated: Yes     HDL Cholesterol: 29 mg/dL     Total Cholesterol: 160 mg/dL   Past Medical History:  Diagnosis Date   Angio-edema suspected of small bowel question due to amlodipine 06/02/2022   Complication of anesthesia 07/28/2022   oxygen desaturations to 60's during EGD, bronchospasm after scope removed, reported being told he has suspected  undiagnosed OSA   COVID-19 07/2021   DEPRESSION    Gastritis and gastroduodenitis    GERD (gastroesophageal reflux disease)    History of hiatal hernia    HIV DISEASE    Hypertension    IBS (irritable bowel syndrome)    Sarcoidosis 2000   question of   Ulnar nerve compression, right     Past Surgical History:  Procedure Laterality Date   COLONOSCOPY  2010   ESOPHAGOGASTRODUODENOSCOPY  2002   LUNG SURGERY  10/31/1998   vats   TONSILLECTOMY AND ADENOIDECTOMY     ULNAR NERVE TRANSPOSITION Right 05/17/2018   Procedure: RIGHT ULNAR NERVE DECOMPRESSION/TRANSPOSITION;  Surgeon: Betha Loa, MD;  Location: [facility];  Service: Orthopedics;  Laterality: Right;   XI ROBOTIC ASSISTED HIATAL HERNIA REPAIR N/A 09/13/2022   Procedure: XI ROBOTIC ASSISTED HIATAL HERNIA REPAIR WITH FUNDOPLICATION WITH MESH;  Surgeon: Axel Filler, MD;  Location: [facility] OR;  Service: General;  Laterality: N/A;    Current Medications: Current Outpatient Medications on File Prior to Visit  Medication Sig Dispense Refill   anastrozole (ARIMIDEX) 1 MG tablet Take 1 tablet (1 mg total) by mouth once a week. (Patient taking differently: Take 1 mg by mouth every Friday.) 20 tablet 1  aspirin EC 81 MG tablet Take 1 tablet (81 mg total) by mouth daily. Swallow whole. 120 tablet 12   bictegravir-emtricitabine-tenofovir AF (BIKTARVY) 50-200-25 MG TABS tablet Take 1 tablet by mouth daily. 30 tablet 11   citalopram (CELEXA) 20 MG tablet Take 1 tablet (20 mg total) by mouth daily. 90 tablet 3   losartan (COZAAR) 25 MG tablet Take 1 tablet (25 mg total) by mouth daily. 30 tablet 1   methocarbamol (ROBAXIN-750) 750 MG tablet Take 1 tablet (750 mg total) by mouth 4 (four) times daily. (Patient taking differently: Take 750 mg by mouth 4 (four) times daily. PRN) 30 tablet 0   rosuvastatin (CRESTOR) 20 MG tablet Take 1 tablet (20 mg total) by mouth daily. 30 tablet 2   tadalafil (CIALIS) 5 MG tablet Take 1 tablet  by mouth once daily 90 tablet 3   testosterone cypionate (DEPOTESTOSTERONE CYPIONATE) 200 MG/ML injection Inject 0.5 mLs (100 mg total) into the muscle once a week. 10 mL 0   zolpidem (AMBIEN) 5 MG tablet Take 1 tablet (5 mg total) by mouth at bedtime as needed for sleep. 30 tablet 1   Current Facility-Administered Medications on File Prior to Visit  Medication Dose Route Frequency Provider Last Rate Last Admin   ondansetron (ZOFRAN) 4 mg in sodium chloride 0.9 % 50 mL IVPB  4 mg Intravenous Once Cirigliano, Vito V, DO         Allergies:   Amlodipine besylate, Cepacol [benzocaine-menthol], Morphine, Vibramycin [doxycycline], Celebrex [celecoxib], and Wellbutrin [bupropion]   Social History   Socioeconomic History   Marital status: Married    Spouse name: Not on file   Number of children: 2   Years of education: Not on file   Highest education level: Not on file  Occupational History   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: [employer]  Tobacco Use   Smoking status: Former    Packs/day: 1.00    Years: 2.00    Additional pack years: 0.00    Total pack years: 2.00    Types: Cigarettes    Quit date: 11/01/1983    Years since quitting: 39.3   Smokeless tobacco: Never  Vaping Use   Vaping Use: Never used  Substance and Sexual Activity   Alcohol use: Yes    Alcohol/week: 1.0 standard drink of alcohol    Types: 1 Standard drinks or equivalent per week    Comment: socially    Drug use: No   Sexual activity: Yes    Comment: declined condoms  Other Topics Concern   Not on file  Social History Narrative   Married to [place], daughters Jearld Pies   RN PACU, ED   Former smoker no drug use occasional alcohol   Social Determinants of Health   Financial Resource Strain: Not on file  Food Insecurity: No Food Insecurity (01/03/2023)   Hunger Vital Sign    Worried About Running Out of Food in the Last Year: Never true    Ran Out of Food in the Last Year: Never  true  Transportation Needs: No Transportation Needs (01/03/2023)   PRAPARE - Administrator, Civil Service (Medical): No    Lack of Transportation (Non-Medical): No  Physical Activity: Not on file  Stress: Not on file  Social Connections: Not on file     Family History: The patient's family history includes Cancer in his mother; Hyperlipidemia in his mother; Hypertension in his mother. There is  no history of Sudden death, Heart attack, Diabetes, Colon cancer, or Prostate cancer.  ROS:   Please see the history of present illness.     All other systems reviewed and are negative.  EKGs/Labs/Other Studies Reviewed:    The following studies were reviewed today:   EKG:  EKG is  ordered today.  The ekg ordered today demonstrates   Recent Labs: 12/14/2022: TSH 2.730 02/28/2023: ALT 14; BUN 13; Creatinine, Ser 1.26; Hemoglobin 16.2; Magnesium 2.1; Platelets 159; Potassium 3.3; Sodium 138   Recent Lipid Panel    Component Value Date/Time   CHOL 160 01/03/2023 0808   CHOL 162 12/14/2022 0819   TRIG 97 01/03/2023 0808   HDL 29 (L) 01/03/2023 0808   HDL 29 (L) 12/14/2022 0819   CHOLHDL 5.5 01/03/2023 0808   VLDL 19 01/03/2023 0808   LDLCALC 112 (H) 01/03/2023 0808   LDLCALC 109 (H) 12/14/2022 0819   LDLCALC 117 (H) 09/21/2022 0848   LDLDIRECT 115.0 01/07/2019 0920     Risk Assessment/Calculations:     Physical Exam:    VS:   Vitals:   03/06/23 1337  BP: (!) 140/82  Pulse: 61  SpO2: 98%    Wt Readings from Last 3 Encounters:  03/06/23 180 lb 6.4 oz (81.8 kg)  02/28/23 180 lb 12.4 oz (82 kg)  01/26/23 179 lb (81.2 kg)     GEN:  Well nourished, well developed in no acute distress HEENT: Normal NECK: No JVD; No carotid bruits CARDIAC: RRR, no murmurs, rubs, gallops RESPIRATORY:  Clear to auscultation without rales, wheezing or rhonchi  ABDOMEN: Soft, non-tender, non-distended MUSCULOSKELETAL:  No edema; No deformity  SKIN: Warm and dry NEUROLOGIC:  Alert  and oriented x 3 PSYCHIATRIC:  Normal affect   ASSESSMENT:   Chest Pressure:  has CP persistently. His risk is high with hx of HIV and recent stroke. Will plan for coronary CTA  TIA: 01/03/2023, s/p DAPT.  continue asa 81 mg daily, crestor 20 mg daily  HTN: continue current regimen.  PLAN:    In order of problems listed above:  Coronary CTA with morphology with metop tartrate 50 mg XL daily Fasting lipids Follow up pending results      Medication Adjustments/Labs and Tests Ordered: Current medicines are reviewed at length with the patient today.  Concerns regarding medicines are outlined above.  Orders Placed This Encounter  Procedures   CT CORONARY MORPH W/CTA COR W/SCORE W/CA W/CM &/OR WO/CM   Lipid panel   Meds ordered this encounter  Medications   metoprolol tartrate (LOPRESSOR) 50 MG tablet    Sig: Take 1 tablet (50 mg total) by mouth once for 1 dose, 2 hours prior to CT scan.    Dispense:  1 tablet    Refill:  0    Patient Instructions  Medication Instructions:  PLEASE TAKE METOPROLOL TARTRATE 50mg  TWO HOURS PRIOR TO CCTA SCAN  *If you need a refill on your cardiac medications before your next appointment, please call your pharmacy*  Lab Work: Please return for FASTING  Blood Work AT YOUR CONVENIENCE  No appointment needed, lab here at the office is open Monday-Friday from 8AM to 4PM and closed daily for lunch from 12:45-1:45.    If you have labs (blood work) drawn today and your tests are completely normal, you will receive your results only by: MyChart Message (if you have MyChart) OR A paper copy in the mail If you have any lab test that is abnormal or we need  to change your treatment, we will call you to review the results.  Testing/Procedures: Your physician has requested that you have cardiac CT. Cardiac computed tomography (CT) is a painless test that uses an x-ray machine to take clear, detailed pictures of your heart. For further information please visit  [email or web address] Please follow instruction sheet as given.   Follow-Up: At [facility], you and your health needs are our priority.  As part of our continuing mission to provide you with exceptional heart care, we have created designated Provider Care Teams.  These Care Teams include your primary Cardiologist (physician) and Advanced Practice Providers (APPs -  Physician Assistants and Nurse Practitioners) who all work together to provide you with the care you need, when you need it.  Your next appointment:   AS NEEDED   Provider:   Maisie Fus, MD       Your cardiac CT will be scheduled at one of the below locations:   [facility] [address] [place], [place] [address] [phone]  If scheduled at [facility], please arrive at the [facility] and Children's Entrance (Entrance C2) of [facility] 30 minutes prior to test start time. You can use the FREE valet parking offered at entrance C (encouraged to control the heart rate for the test)  Proceed to the [facility] Radiology Department (first floor) to check-in and test prep.  All radiology patients and guests should use entrance C2 at [facility], accessed from [facility], even though the hospital's physical address listed is [address].     Please follow these instructions carefully (unless otherwise directed):  Hold all erectile dysfunction medications at least 3 days (72 hrs) prior to test. (Ie viagra, cialis, sildenafil, tadalafil, etc) We will administer nitroglycerin during this exam.   On the Night Before the Test: Be sure to Drink plenty of water. Do not consume any caffeinated/decaffeinated beverages or chocolate 12 hours prior to your test. Do not take any antihistamines 12 hours prior to your test.  On the Day of the Test: Drink plenty of water until 1 hour prior to the test. Do not eat any food 1 hour prior to test. You may take your  regular medications prior to the test.  Take metoprolol (Lopressor) two hours prior to test. If you take Furosemide/Hydrochlorothiazide/Spironolactone, please HOLD on the morning of the test.      After the Test: Drink plenty of water. After receiving IV contrast, you may experience a mild flushed feeling. This is normal. On occasion, you may experience a mild rash up to 24 hours after the test. This is not dangerous. If this occurs, you can take Benadryl 25 mg and increase your fluid intake. If you experience trouble breathing, this can be serious. If it is severe call 911 IMMEDIATELY. If it is mild, please call our office. If you take any of these medications: Glipizide/Metformin, Avandament, Glucavance, please do not take 48 hours after completing test unless otherwise instructed.  We will call to schedule your test 2-4 weeks out understanding that some insurance companies will need an authorization prior to the service being performed.   For non-scheduling related questions, please contact the cardiac imaging nurse navigator should you have any questions/concerns: Rockwell [place], Cardiac Imaging Nurse Navigator Larey Brick, Cardiac Imaging Nurse Navigator [facility] Direct Office Dial: [phone]   For scheduling needs, including cancellations and rescheduling, please call [place], [phone].  Signed, Maisie Fus, MD  03/06/2023 2:29 PM     [facility]

## 2023-03-06 NOTE — Patient Instructions (Addendum)
Medication Instructions:  PLEASE TAKE METOPROLOL TARTRATE 50mg  TWO HOURS PRIOR TO CCTA SCAN  *If you need a refill on your cardiac medications before your next appointment, please call your pharmacy*  Lab Work: Please return for FASTING  Blood Work AT YOUR CONVENIENCE  No appointment needed, lab here at the office is open Monday-Friday from 8AM to 4PM and closed daily for lunch from 12:45-1:45.    If you have labs (blood work) drawn today and your tests are completely normal, you will receive your results only by: MyChart Message (if you have MyChart) OR A paper copy in the mail If you have any lab test that is abnormal or we need to change your treatment, we will call you to review the results.  Testing/Procedures: Your physician has requested that you have cardiac CT. Cardiac computed tomography (CT) is a painless test that uses an x-ray machine to take clear, detailed pictures of your heart. For further information please visit [email or web address] Please follow instruction sheet as given.   Follow-Up: At [facility], you and your health needs are our priority.  As part of our continuing mission to provide you with exceptional heart care, we have created designated Provider Care Teams.  These Care Teams include your primary Cardiologist (physician) and Advanced Practice Providers (APPs -  Physician Assistants and Nurse Practitioners) who all work together to provide you with the care you need, when you need it.  Your next appointment:   AS NEEDED   Provider:   Maisie Fus, MD       Your cardiac CT will be scheduled at one of the below locations:   [facility] [address] [place], [place] [address] [phone]  If scheduled at [facility], please arrive at the [facility] and Children's Entrance (Entrance C2) of [facility] 30 minutes prior to test start time. You can use the FREE valet parking offered at entrance C (encouraged to  control the heart rate for the test)  Proceed to the [facility] Radiology Department (first floor) to check-in and test prep.  All radiology patients and guests should use entrance C2 at [facility], accessed from [facility], even though the hospital's physical address listed is [address]     Please follow these instructions carefully (unless otherwise directed):  Hold all erectile dysfunction medications at least 3 days (72 hrs) prior to test. (Ie viagra, cialis, sildenafil, tadalafil, etc) We will administer nitroglycerin during this exam.   On the Night Before the Test: Be sure to Drink plenty of water. Do not consume any caffeinated/decaffeinated beverages or chocolate 12 hours prior to your test. Do not take any antihistamines 12 hours prior to your test.  On the Day of the Test: Drink plenty of water until 1 hour prior to the test. Do not eat any food 1 hour prior to test. You may take your regular medications prior to the test.  Take metoprolol (Lopressor) two hours prior to test. If you take Furosemide/Hydrochlorothiazide/Spironolactone, please HOLD on the morning of the test.      After the Test: Drink plenty of water. After receiving IV contrast, you may experience a mild flushed feeling. This is normal. On occasion, you may experience a mild rash up to 24 hours after the test. This is not dangerous. If this occurs, you can take Benadryl 25 mg and increase your fluid intake. If you experience trouble breathing, this can be serious. If it is  severe call 911 IMMEDIATELY. If it is mild, please call our office. If you take any of these medications: Glipizide/Metformin, Avandament, Glucavance, please do not take 48 hours after completing test unless otherwise instructed.  We will call to schedule your test 2-4 weeks out understanding that some insurance companies will need an authorization prior to the service being performed.   For  non-scheduling related questions, please contact the cardiac imaging nurse navigator should you have any questions/concerns: Rockwell Alexandria, Cardiac Imaging Nurse Navigator Larey Brick, Cardiac Imaging Nurse Navigator [facility] Direct Office Dial: [phone]   For scheduling needs, including cancellations and rescheduling, please call [place], [phone].

## 2023-03-07 LAB — LIPID PANEL
Chol/HDL Ratio: 3.2 ratio (ref 0.0–5.0)
Cholesterol, Total: 108 mg/dL (ref 100–199)
HDL: 34 mg/dL — ABNORMAL LOW (ref >39)
LDL Chol Calc (NIH): 48 mg/dL (ref 0–99)
Triglycerides: 148 mg/dL (ref 0–149)
VLDL Cholesterol Cal: 26 mg/dL (ref 5–40)

## 2023-03-08 ENCOUNTER — Telehealth (HOSPITAL_COMMUNITY): Payer: Self-pay | Admitting: *Deleted

## 2023-03-08 NOTE — Telephone Encounter (Signed)
Reaching out to patient to offer assistance regarding upcoming cardiac imaging study; pt verbalizes understanding of appt date/time, parking situation and where to check in, pre-test NPO status  and verified current allergies; name and call back number provided for further questions should they arise  Larey Brick RN Navigator Cardiac Imaging [facility] [phone] office [phone] cell  Patient report bradycardia. He is aware to hold his cialis and will arrive at 3pm.

## 2023-03-13 ENCOUNTER — Ambulatory Visit (HOSPITAL_COMMUNITY): Admission: RE | Admit: 2023-03-13 | Payer: Commercial Managed Care - PPO | Source: Ambulatory Visit

## 2023-03-17 ENCOUNTER — Telehealth (HOSPITAL_COMMUNITY): Payer: Self-pay | Admitting: Emergency Medicine

## 2023-03-17 NOTE — Telephone Encounter (Signed)
Reaching out to patient to offer assistance regarding upcoming cardiac imaging study; pt verbalizes understanding of appt date/time, parking situation and where to check in, pre-test NPO status and medications ordered, and verified current allergies; name and call back number provided for further questions should they arise Chivas Notz RN Navigator Cardiac Imaging [facility] [phone] office [phone] cell 

## 2023-03-20 ENCOUNTER — Other Ambulatory Visit (HOSPITAL_BASED_OUTPATIENT_CLINIC_OR_DEPARTMENT_OTHER): Payer: Self-pay

## 2023-03-20 ENCOUNTER — Ambulatory Visit (HOSPITAL_COMMUNITY)
Admission: RE | Admit: 2023-03-20 | Discharge: 2023-03-20 | Disposition: A | Discharge status: Home / Self Care | Payer: Commercial Managed Care - PPO | Source: Ambulatory Visit | Attending: Internal Medicine | Admitting: Internal Medicine

## 2023-03-20 DIAGNOSIS — R072 Precordial pain: Secondary | ICD-10-CM | POA: Diagnosis present

## 2023-03-20 DIAGNOSIS — I2510 Atherosclerotic heart disease of native coronary artery without angina pectoris: Secondary | ICD-10-CM | POA: Diagnosis not present

## 2023-03-20 MED ORDER — NITROGLYCERIN 0.4 MG SL SUBL
0.8000 mg | SUBLINGUAL_TABLET | Freq: Once | SUBLINGUAL | Status: AC
  Administered 2023-03-20: 0.8 mg via SUBLINGUAL

## 2023-03-20 MED ORDER — NITROGLYCERIN 0.4 MG SL SUBL
SUBLINGUAL_TABLET | SUBLINGUAL | Status: AC
  Filled 2023-03-20: qty 2

## 2023-03-20 MED ORDER — IOHEXOL 350 MG/ML SOLN
100.0000 mL | Freq: Once | INTRAVENOUS | Status: AC | PRN
  Administered 2023-03-20: 100 mL via INTRAVENOUS

## 2023-04-04 ENCOUNTER — Other Ambulatory Visit (HOSPITAL_COMMUNITY): Payer: Self-pay

## 2023-04-04 ENCOUNTER — Other Ambulatory Visit: Payer: Self-pay

## 2023-04-07 ENCOUNTER — Other Ambulatory Visit (HOSPITAL_BASED_OUTPATIENT_CLINIC_OR_DEPARTMENT_OTHER): Payer: Self-pay

## 2023-04-07 ENCOUNTER — Other Ambulatory Visit (HOSPITAL_COMMUNITY): Payer: Self-pay

## 2023-04-10 ENCOUNTER — Other Ambulatory Visit (HOSPITAL_BASED_OUTPATIENT_CLINIC_OR_DEPARTMENT_OTHER): Payer: Self-pay

## 2023-04-10 MED ORDER — ANASTROZOLE 1 MG PO TABS
1.0000 mg | ORAL_TABLET | ORAL | 1 refills | Status: DC
  Filled 2023-04-10: qty 12, 84d supply, fill #0
  Filled 2023-06-23: qty 12, 84d supply, fill #1
  Filled 2023-09-20: qty 12, 84d supply, fill #2
  Filled 2023-12-11: qty 4, 28d supply, fill #3

## 2023-04-11 ENCOUNTER — Encounter (HOSPITAL_BASED_OUTPATIENT_CLINIC_OR_DEPARTMENT_OTHER): Payer: Self-pay | Admitting: Family Medicine

## 2023-04-14 ENCOUNTER — Ambulatory Visit (INDEPENDENT_AMBULATORY_CARE_PROVIDER_SITE_OTHER): Payer: Commercial Managed Care - PPO | Admitting: Family Medicine

## 2023-04-14 ENCOUNTER — Other Ambulatory Visit (HOSPITAL_BASED_OUTPATIENT_CLINIC_OR_DEPARTMENT_OTHER): Payer: Self-pay

## 2023-04-14 VITALS — BP 141/91 | HR 58 | Ht 68.0 in | Wt 183.8 lb

## 2023-04-14 DIAGNOSIS — Z0000 Encounter for general adult medical examination without abnormal findings: Secondary | ICD-10-CM

## 2023-04-14 DIAGNOSIS — Z1211 Encounter for screening for malignant neoplasm of colon: Secondary | ICD-10-CM

## 2023-04-14 DIAGNOSIS — Z125 Encounter for screening for malignant neoplasm of prostate: Secondary | ICD-10-CM

## 2023-04-14 DIAGNOSIS — I10 Essential (primary) hypertension: Secondary | ICD-10-CM

## 2023-04-14 MED ORDER — LOSARTAN POTASSIUM 50 MG PO TABS
50.0000 mg | ORAL_TABLET | Freq: Every day | ORAL | 1 refills | Status: DC
Start: 2023-04-14 — End: 2023-05-15
  Filled 2023-04-14: qty 30, 30d supply, fill #0

## 2023-04-14 MED ORDER — ASPIRIN 81 MG PO TBEC
81.0000 mg | DELAYED_RELEASE_TABLET | Freq: Every day | ORAL | 1 refills | Status: DC
  Filled 2023-04-14: qty 90, 90d supply, fill #0
  Filled 2023-07-11: qty 90, 90d supply, fill #1

## 2023-04-14 MED ORDER — ROSUVASTATIN CALCIUM 20 MG PO TABS
20.0000 mg | ORAL_TABLET | Freq: Every day | ORAL | 1 refills | Status: DC
  Filled 2023-04-14: qty 90, 90d supply, fill #0
  Filled 2023-07-11: qty 90, 90d supply, fill #1

## 2023-04-14 NOTE — Patient Instructions (Signed)
  Medication Instructions:  Your physician recommends that you continue on your current medications as directed. Please refer to the Current Medication list given to you today. --If you need a refill on any your medications before your next appointment, please call your pharmacy first. If no refills are authorized on file call the office.-- Lab Work: Your physician has recommended that you have lab work today: No If you have labs (blood work) drawn today and your tests are completely normal, you will receive your results via MyChart message OR a phone call from our staff.  Please ensure you check your voicemail in the event that you authorized detailed messages to be left on a delegated number. If you have any lab test that is abnormal or we need to change your treatment, we will call you to review the results.  Referrals/Procedures/Imaging: No  Follow-Up: Your next appointment:   Your physician recommends that you schedule a follow-up appointment in: 4-6 weeks with Dr. de [place].  You will receive a text message or e-mail with a link to a survey about your care and experience with us today! We would greatly appreciate your feedback!   Thanks for letting us be apart of your health journey!!  Primary Care and Sports Medicine   Dr. Raymond de Cuba   We encourage you to activate your patient portal called "MyChart".  Sign up information is provided on this After Visit Summary.  MyChart is used to connect with patients for Virtual Visits (Telemedicine).  Patients are able to view lab/test results, encounter notes, upcoming appointments, etc.  Non-urgent messages can be sent to your provider as well. To learn more about what you can do with MyChart, please visit --  [email or web address].    

## 2023-04-14 NOTE — Progress Notes (Signed)
Subjective:    CC: Annual Physical Exam  HPI:  Bruce Woods is a 62 y.o. presenting for annual physical  I reviewed the past medical history, family history, social history, surgical history, and allergies today and no changes were needed.  Please see the problem list section below in epic for further details.  Past Medical History: Past Medical History:  Diagnosis Date   Allergy    Angio-edema suspected of small bowel question due to amlodipine 06/02/2022   Anxiety    Complication of anesthesia 07/28/2022   oxygen desaturations to 60's during EGD, bronchospasm after scope removed, reported being told he has suspected undiagnosed OSA   COVID-19 07/2021   DEPRESSION    Gastritis and gastroduodenitis    GERD (gastroesophageal reflux disease)    History of hiatal hernia    HIV DISEASE    Hypertension    IBS (irritable bowel syndrome)    Sarcoidosis 2000   question of   Sleep apnea    Ulnar nerve compression, right    Past Surgical History: Past Surgical History:  Procedure Laterality Date   COLONOSCOPY  2010   ESOPHAGOGASTRODUODENOSCOPY  2002   HERNIA REPAIR     LUNG SURGERY  10/31/1998   vats   TONSILLECTOMY AND ADENOIDECTOMY     ULNAR NERVE TRANSPOSITION Right 05/17/2018   Procedure: RIGHT ULNAR NERVE DECOMPRESSION/TRANSPOSITION;  Surgeon: Betha Loa, MD;  Location: [facility];  Service: Orthopedics;  Laterality: Right;   XI ROBOTIC ASSISTED HIATAL HERNIA REPAIR N/A 09/13/2022   Procedure: XI ROBOTIC ASSISTED HIATAL HERNIA REPAIR WITH FUNDOPLICATION WITH MESH;  Surgeon: Axel Filler, MD;  Location: [facility] OR;  Service: General;  Laterality: N/A;   Social History: Social History   Socioeconomic History   Marital status: Married    Spouse name: Not on file   Number of children: 2   Years of education: Not on file   Highest education level: Not on file  Occupational History   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: [employer]   Tobacco Use   Smoking status: Former    Packs/day: 1.00    Years: 2.00    Additional pack years: 0.00    Total pack years: 2.00    Types: Cigarettes    Quit date: 11/01/1983    Years since quitting: 39.4   Smokeless tobacco: Never  Vaping Use   Vaping Use: Never used  Substance and Sexual Activity   Alcohol use: Yes    Alcohol/week: 2.0 standard drinks of alcohol    Types: 2 Shots of liquor per week    Comment: Social   Drug use: No   Sexual activity: Not Currently    Birth control/protection: Abstinence    Comment: declined condoms  Other Topics Concern   Not on file  Social History Narrative   Married to [place], daughters Jearld Pies   RN PACU, ED   Former smoker no drug use occasional alcohol   Social Determinants of Health   Financial Resource Strain: Low Risk  (04/14/2023)   Overall Financial Resource Strain (CARDIA)    Difficulty of Paying Living Expenses: Not hard at all  Food Insecurity: No Food Insecurity (01/03/2023)   Hunger Vital Sign    Worried About Running Out of Food in the Last Year: Never true    Ran Out of Food in the Last Year: Never true  Transportation Needs: No Transportation Needs (01/03/2023)   PRAPARE - Transportation  Lack of Transportation (Medical): No    Lack of Transportation (Non-Medical): No  Physical Activity: Inactive (04/14/2023)   Exercise Vital Sign    Days of Exercise per Week: 0 days    Minutes of Exercise per Session: 0 min  Stress: Stress Concern Present (04/14/2023)   [employer] of Occupational Health - Occupational Stress Questionnaire    Feeling of Stress : Rather much  Social Connections: Socially Isolated (04/14/2023)   Social Connection and Isolation Panel [NHANES]    Frequency of Communication with Friends and Family: Once a week    Frequency of Social Gatherings with Friends and Family: Never    Attends Religious Services: Never    Database administrator or Organizations: No    Attends Hospital doctor: Never    Marital Status: Married   Family History: Family History  Problem Relation Age of Onset   Hyperlipidemia Mother    Hypertension Mother    Cancer Mother        glioblastoma   Sudden death Neg Hx    Heart attack Neg Hx    Diabetes Neg Hx    Colon cancer Neg Hx    Prostate cancer Neg Hx    Allergies: Allergies  Allergen Reactions   Amlodipine Besylate Swelling    Suspected angioedema of bowel   Cepacol [Benzocaine-Menthol] Anaphylaxis   Morphine Anaphylaxis and Other (See Comments)    Resp distress, rash, tachycardia   Vibramycin [Doxycycline] Nausea And Vomiting   Celebrex [Celecoxib] Other (See Comments)    Elevated LFT's   Wellbutrin [Bupropion] Hives   Medications: See med rec.  Review of Systems: No headache, visual changes, nausea, vomiting, diarrhea, constipation, dizziness, abdominal pain, skin rash, fevers, chills, night sweats, swollen lymph nodes, weight loss, chest pain, body aches, joint swelling, muscle aches, shortness of breath, mood changes, visual or auditory hallucinations.  Objective:    BP (!) 141/91 (BP Location: Right Arm, Patient Position: Sitting, Cuff Size: Normal)   Pulse (!) 58   Ht 5\' 8"  (1.727 m)   Wt 183 lb 12.8 oz (83.4 kg)   SpO2 99%   BMI 27.95 kg/m   General: Well Developed, well nourished, and in no acute distress. Neuro: Alert and oriented x3, extra-ocular muscles intact, sensation grossly intact. Cranial nerves II through XII are intact, motor, sensory, and coordinative functions are all intact. HEENT: Normocephalic, atraumatic, pupils equal round reactive to light, neck supple, no masses, no lymphadenopathy, thyroid nonpalpable. Oropharynx, nasopharynx, external ear canals are unremarkable. Skin: Warm and dry, no rashes noted. Cardiac: Regular rate and rhythm, no murmurs rubs or gallops. Respiratory: Clear to auscultation bilaterally. Not using accessory muscles, speaking in full sentences. Abdominal: Soft,  nontender, nondistended, positive bowel sounds, no masses, no organomegaly. Musculoskeletal: Shoulder, elbow, wrist, hip, knee, ankle stable, and with full range of motion.  Impression and Recommendations:    Wellness examination Assessment & Plan: Routine HCM labs reviewed. HCM reviewed/discussed. Anticipatory guidance regarding healthy weight, lifestyle and choices given. Recommend healthy diet.  Recommend approximately 150 minutes/week of moderate intensity exercise Recommend regular dental and vision exams Always use seatbelt/lap and shoulder restraints Recommend using smoke alarms and checking batteries at least twice a year Recommend using sunscreen when outside Discussed colon cancer screening recommendations, options.  Referral for colonoscopy placed Discussed recommendations for shingles vaccine.  Patient will look into this Discussed tetanus immunization recommendations, patient is UTD   Colon cancer screening -     Ambulatory referral to Gastroenterology  Hypertension,  essential Assessment & Plan: BP still slightly elevated, will increase losartan to 50 mg, recheck BMP in 2 weeks, follow-up in 4-6 weeks to monitor response  Orders: -     Basic metabolic panel; Future -     Losartan Potassium; Take 1 tablet (50 mg total) by mouth daily.  Dispense: 30 tablet; Refill: 1  Other orders -     Aspirin; Take 1 tablet (81 mg total) by mouth daily. Swallow whole.  Dispense: 90 tablet; Refill: 1 -     Rosuvastatin Calcium; Take 1 tablet (20 mg total) by mouth daily.  Dispense: 90 tablet; Refill: 1  Return in about 4 weeks (around 05/12/2023) for hypertension.   ___________________________________________ Phyllicia Dudek de [place], MD, ABFM, CAQSM Primary Care and Sports Medicine [facility]

## 2023-04-14 NOTE — Assessment & Plan Note (Signed)
BP still slightly elevated, will increase losartan to 50 mg, recheck BMP in 2 weeks, follow-up in 4-6 weeks to monitor response

## 2023-04-14 NOTE — Assessment & Plan Note (Addendum)
Routine HCM labs reviewed. HCM reviewed/discussed. Anticipatory guidance regarding healthy weight, lifestyle and choices given. Recommend healthy diet.  Recommend approximately 150 minutes/week of moderate intensity exercise Recommend regular dental and vision exams Always use seatbelt/lap and shoulder restraints Recommend using smoke alarms and checking batteries at least twice a year Recommend using sunscreen when outside Discussed colon cancer screening recommendations, options.  Referral for colonoscopy placed Discussed recommendations for shingles vaccine.  Patient will look into this Discussed tetanus immunization recommendations, patient is UTD

## 2023-04-17 ENCOUNTER — Other Ambulatory Visit (HOSPITAL_BASED_OUTPATIENT_CLINIC_OR_DEPARTMENT_OTHER): Payer: Self-pay

## 2023-04-17 DIAGNOSIS — H5203 Hypermetropia, bilateral: Secondary | ICD-10-CM | POA: Diagnosis not present

## 2023-04-20 ENCOUNTER — Encounter: Payer: Self-pay | Admitting: Behavioral Health

## 2023-04-20 ENCOUNTER — Ambulatory Visit (INDEPENDENT_AMBULATORY_CARE_PROVIDER_SITE_OTHER): Payer: Commercial Managed Care - PPO | Admitting: Behavioral Health

## 2023-04-20 DIAGNOSIS — F411 Generalized anxiety disorder: Secondary | ICD-10-CM

## 2023-04-20 DIAGNOSIS — F4310 Post-traumatic stress disorder, unspecified: Secondary | ICD-10-CM | POA: Diagnosis not present
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
## 2023-04-20 NOTE — Progress Notes (Signed)
Hj,jkk

## 2023-04-20 NOTE — Progress Notes (Signed)
[email or web address]

## 2023-04-20 NOTE — Progress Notes (Signed)
                Zakar Brosch M Lethia Donlon, LCMHC 

## 2023-04-23 ENCOUNTER — Other Ambulatory Visit (HOSPITAL_BASED_OUTPATIENT_CLINIC_OR_DEPARTMENT_OTHER): Payer: Self-pay

## 2023-04-24 ENCOUNTER — Other Ambulatory Visit (HOSPITAL_BASED_OUTPATIENT_CLINIC_OR_DEPARTMENT_OTHER): Payer: Self-pay

## 2023-04-24 ENCOUNTER — Other Ambulatory Visit (HOSPITAL_BASED_OUTPATIENT_CLINIC_OR_DEPARTMENT_OTHER): Payer: Commercial Managed Care - PPO

## 2023-04-24 DIAGNOSIS — I10 Essential (primary) hypertension: Secondary | ICD-10-CM | POA: Diagnosis not present

## 2023-04-24 MED ORDER — TADALAFIL 5 MG PO TABS
5.0000 mg | ORAL_TABLET | Freq: Every day | ORAL | 3 refills | Status: DC
  Filled 2023-04-24: qty 90, 90d supply, fill #0

## 2023-04-25 ENCOUNTER — Telehealth: Payer: Self-pay | Admitting: Internal Medicine

## 2023-04-25 ENCOUNTER — Encounter (HOSPITAL_BASED_OUTPATIENT_CLINIC_OR_DEPARTMENT_OTHER): Payer: Self-pay | Admitting: Family Medicine

## 2023-04-25 LAB — BASIC METABOLIC PANEL
BUN/Creatinine Ratio: 10 (ref 10–24)
BUN: 13 mg/dL (ref 8–27)
CO2: 25 mmol/L (ref 20–29)
Calcium: 9.4 mg/dL (ref 8.6–10.2)
Chloride: 103 mmol/L (ref 96–106)
Creatinine, Ser: 1.33 mg/dL — ABNORMAL HIGH (ref 0.76–1.27)
Glucose: 102 mg/dL — ABNORMAL HIGH (ref 70–99)
Potassium: 3.8 mmol/L (ref 3.5–5.2)
Sodium: 142 mmol/L (ref 134–144)
eGFR: 60 mL/min/{1.73_m2} (ref >59)

## 2023-04-25 NOTE — Telephone Encounter (Signed)
Good Afternoon Dr. Leone Payor,  Patient called wanting to discuss scheduling his colonoscopy. Patient stated he has not had a colonoscopy since he was 19 and he is currently 22. Patient stated he wanted to see if you felt he needed to have his procedure done in our [facility] or if it needed to be done in a hospital setting due to difficult intubation. Will you please review and advise on scheduling patient?  Thank you.

## 2023-04-26 ENCOUNTER — Encounter: Payer: Self-pay | Admitting: Internal Medicine

## 2023-04-26 NOTE — Telephone Encounter (Signed)
I called the patient - reviewed chart also.  He had a desaturation with EGD in 2023.  He had no problems with intubation for Nissen fundoplication 12/2022.  He may be scheduled for direct colonoscopy in [facility] - please arrange

## 2023-04-26 NOTE — Telephone Encounter (Addendum)
Thank you Dr. Leone Payor,  Patient has been scheduled for colonoscopy on 8/20 at 9:30 and his PV on 8/7 at 10:30

## 2023-05-08 ENCOUNTER — Other Ambulatory Visit (HOSPITAL_BASED_OUTPATIENT_CLINIC_OR_DEPARTMENT_OTHER): Payer: Self-pay

## 2023-05-08 ENCOUNTER — Other Ambulatory Visit (HOSPITAL_COMMUNITY): Payer: Self-pay

## 2023-05-12 ENCOUNTER — Other Ambulatory Visit (HOSPITAL_BASED_OUTPATIENT_CLINIC_OR_DEPARTMENT_OTHER): Payer: Self-pay

## 2023-05-15 ENCOUNTER — Other Ambulatory Visit (HOSPITAL_BASED_OUTPATIENT_CLINIC_OR_DEPARTMENT_OTHER): Payer: Self-pay

## 2023-05-15 ENCOUNTER — Encounter (HOSPITAL_BASED_OUTPATIENT_CLINIC_OR_DEPARTMENT_OTHER): Payer: Self-pay | Admitting: Family Medicine

## 2023-05-15 ENCOUNTER — Ambulatory Visit (HOSPITAL_BASED_OUTPATIENT_CLINIC_OR_DEPARTMENT_OTHER): Payer: Commercial Managed Care - PPO | Admitting: Family Medicine

## 2023-05-15 VITALS — BP 168/94 | HR 99 | Ht 68.0 in | Wt 186.0 lb

## 2023-05-15 DIAGNOSIS — I10 Essential (primary) hypertension: Secondary | ICD-10-CM | POA: Diagnosis not present

## 2023-05-15 MED ORDER — TESTOSTERONE CYPIONATE 200 MG/ML IM SOLN
100.0000 mg | INTRAMUSCULAR | 0 refills | Status: DC
  Filled 2023-05-15: qty 2, 28d supply, fill #0
  Filled 2023-05-30: qty 10, 70d supply, fill #0

## 2023-05-15 MED ORDER — LOSARTAN POTASSIUM 100 MG PO TABS
100.0000 mg | ORAL_TABLET | Freq: Every day | ORAL | 1 refills | Status: DC
Start: 2023-05-15 — End: 2023-07-07
  Filled 2023-05-15: qty 30, 30d supply, fill #0
  Filled 2023-06-11: qty 30, 30d supply, fill #1

## 2023-05-15 NOTE — Progress Notes (Signed)
    Procedures performed today:    None.  Independent interpretation of notes and tests performed by another provider:   None.  Brief History, Exam, Impression, and Recommendations:    BP (!) 168/94   Pulse 99   Ht 5\' 8"  (1.727 m)   Wt 186 lb (84.4 kg)   SpO2 98%   BMI 28.28 kg/m   Hypertension, essential Assessment & Plan: Blood pressure continues to be elevated.  Patient has been checking blood pressure at home and readings at home have been better than here in the office, however still above goal.  He is not having any current issues with chest pain, headaches, vision changes.  Recently completed labs were stable which is reassuring. We discussed general considerations.  Given continued elevation, we will proceed with medication adjustment.  Discussed option of adding a low-dose alternative agent versus increasing dose of losartan.  Patient would prefer to increase dose of losartan which is reasonable, proceed with titrating from 50 mg to 100 mg once daily.  Will again check labs in about 2 weeks with this dose change Plan for follow-up in about 4 to 6 weeks to monitor progress with medication change. Recommend intermittent monitoring of blood pressure at home, DASH diet  Orders: -     Basic metabolic panel; Future -     Losartan Potassium; Take 1 tablet (100 mg total) by mouth daily.  Dispense: 30 tablet; Refill: 1  Return in about 4 weeks (around 06/12/2023) for hypertension.   ___________________________________________ Gibril Mastro de Peru, MD, ABFM, CAQSM Primary Care and Sports Medicine [facility]

## 2023-05-15 NOTE — Assessment & Plan Note (Signed)
Blood pressure continues to be elevated.  Patient has been checking blood pressure at home and readings at home have been better than here in the office, however still above goal.  He is not having any current issues with chest pain, headaches, vision changes.  Recently completed labs were stable which is reassuring. We discussed general considerations.  Given continued elevation, we will proceed with medication adjustment.  Discussed option of adding a low-dose alternative agent versus increasing dose of losartan.  Patient would prefer to increase dose of losartan which is reasonable, proceed with titrating from 50 mg to 100 mg once daily.  Will again check labs in about 2 weeks with this dose change Plan for follow-up in about 4 to 6 weeks to monitor progress with medication change. Recommend intermittent monitoring of blood pressure at home, DASH diet

## 2023-05-16 ENCOUNTER — Other Ambulatory Visit (HOSPITAL_BASED_OUTPATIENT_CLINIC_OR_DEPARTMENT_OTHER): Payer: Self-pay

## 2023-05-17 ENCOUNTER — Other Ambulatory Visit (HOSPITAL_BASED_OUTPATIENT_CLINIC_OR_DEPARTMENT_OTHER): Payer: Self-pay

## 2023-05-18 ENCOUNTER — Other Ambulatory Visit (HOSPITAL_BASED_OUTPATIENT_CLINIC_OR_DEPARTMENT_OTHER): Payer: Self-pay

## 2023-05-22 ENCOUNTER — Other Ambulatory Visit (HOSPITAL_BASED_OUTPATIENT_CLINIC_OR_DEPARTMENT_OTHER): Payer: Self-pay

## 2023-05-23 ENCOUNTER — Other Ambulatory Visit (HOSPITAL_BASED_OUTPATIENT_CLINIC_OR_DEPARTMENT_OTHER): Payer: Self-pay

## 2023-05-25 ENCOUNTER — Encounter: Payer: Self-pay | Admitting: Behavioral Health

## 2023-05-25 ENCOUNTER — Ambulatory Visit (INDEPENDENT_AMBULATORY_CARE_PROVIDER_SITE_OTHER): Payer: Commercial Managed Care - PPO | Admitting: Behavioral Health

## 2023-05-25 ENCOUNTER — Other Ambulatory Visit (HOSPITAL_BASED_OUTPATIENT_CLINIC_OR_DEPARTMENT_OTHER): Payer: Self-pay

## 2023-05-25 DIAGNOSIS — F411 Generalized anxiety disorder: Secondary | ICD-10-CM | POA: Diagnosis not present

## 2023-05-25 DIAGNOSIS — F4310 Post-traumatic stress disorder, unspecified: Secondary | ICD-10-CM

## 2023-05-25 NOTE — Progress Notes (Addendum)
[facility] Counselor/Therapist Progress Note  Patient ID: CHARLS GROWNEY, MRN: [id number],    Date: 05/25/2023  Time Spent: 58 minutes, 4 PM to 4:58 PM.This session was held via video teletherapy. The patient consented to the video teletherapy and was located in his car during this session. He is aware it is the responsibility of the patient to secure confidentiality on his end of the session. The provider was in a private home office for the duration of this session.    Treatment Type: Individual Therapy  Reported Symptoms: Stress, anxiety  Mental Status Exam: Appearance:  Well Groomed     Behavior: Appropriate  Motor: Normal  Speech/Language:  Normal Rate  Affect: Appropriate  Mood: anxious  Thought process: normal  Thought content:   WNL  Sensory/Perceptual disturbances:   WNL  Orientation: oriented to person, place, time/date, situation, day of week, and month of year  Attention: Good  Concentration: Good  Memory: WNL  Fund of knowledge:  Good  Insight:   Good  Judgment:  Good  Impulse Control: Good   Risk Assessment: Danger to Self:  No Self-injurious Behavior: No Danger to Others: No Duty to Warn:no Physical Aggression / Violence:No  Access to Firearms a concern: No  Gang Involvement:No   Subjective: I reviewed the treatment plan with the patient and he agreed to it.  We continue to process more of some of the factors that have been contributing to his anxiety.  He recognizes that there are things in his history that he has not processed or understood and that is leading to racing thoughts as well as significant anxiety panic attacks exhibited through excessive sweating increased heart rate.  He also has some social anxiety with fear of being in certain public places.  He recognizes in part that is connected to trust and we will begin to explore how that lack of trust drives his anxiety.  We looked at people and places that he does trust versus that he  does not.  We looked at ways that he has relieved his anxiety and stress both healthy and unhealthy and began to look at healthier ways to do so.  We looked at things that he intellectually may have been taking ownership in that he did not need to introducing cognitive reframing as a way to challenge those anxious thoughts or irrational thoughts.  I reminded him a relaxation breathing but also introduced a grounding exercise for him to practice prior to our next session.  Interventions: Cognitive Behavioral Therapy and Dialectical Behavioral Therapy  Diagnosis: Generalized anxiety disorder with panic attacks  Plan: I will meet with the patient every 2 to 3 weeks via care agility.  Treatment plan: We will use cognitive behavioral therapy as well as elements of dialectical behavior therapy and person centered therapy.  Goals will be to process the core conflicts contributing to current anxiety and panic, identify causes for anxiety and explore ways to lower it while improving his ability to manage anxiety symptoms and better handle stress.  We will also use cognitive behavioral therapy reframing to help him manage thoughts and worrisome thinking's contributing to feelings of anxiety.  Interventions will include providing education about anxiety, facilitate problem solution skills to help him identify and implement options for resolving stress, teach coping skills for managing anxiety, use cognitive behavioral therapy to identify and change anxiety provoking thoughts especially related to history and current social anxieties, as well as teach dialectical behavior therapy distress tolerance and mindfulness skills.  Goals are to reduce anxiety by 50% with a target date of October 31, 2023.  French Ana, [facility]

## 2023-05-26 ENCOUNTER — Other Ambulatory Visit (HOSPITAL_BASED_OUTPATIENT_CLINIC_OR_DEPARTMENT_OTHER): Payer: Self-pay

## 2023-05-29 ENCOUNTER — Other Ambulatory Visit (HOSPITAL_BASED_OUTPATIENT_CLINIC_OR_DEPARTMENT_OTHER): Payer: Self-pay

## 2023-05-30 ENCOUNTER — Emergency Department (HOSPITAL_BASED_OUTPATIENT_CLINIC_OR_DEPARTMENT_OTHER): Payer: Commercial Managed Care - PPO

## 2023-05-30 ENCOUNTER — Encounter (HOSPITAL_BASED_OUTPATIENT_CLINIC_OR_DEPARTMENT_OTHER): Payer: Self-pay | Admitting: Family Medicine

## 2023-05-30 ENCOUNTER — Other Ambulatory Visit (HOSPITAL_BASED_OUTPATIENT_CLINIC_OR_DEPARTMENT_OTHER): Payer: Self-pay

## 2023-05-30 ENCOUNTER — Ambulatory Visit (HOSPITAL_BASED_OUTPATIENT_CLINIC_OR_DEPARTMENT_OTHER): Payer: Commercial Managed Care - PPO | Admitting: Family Medicine

## 2023-05-30 ENCOUNTER — Encounter (HOSPITAL_BASED_OUTPATIENT_CLINIC_OR_DEPARTMENT_OTHER): Payer: Self-pay

## 2023-05-30 ENCOUNTER — Telehealth: Payer: Self-pay | Admitting: Internal Medicine

## 2023-05-30 ENCOUNTER — Emergency Department (HOSPITAL_BASED_OUTPATIENT_CLINIC_OR_DEPARTMENT_OTHER)
Admission: EM | Admit: 2023-05-30 | Discharge: 2023-05-30 | Disposition: A | Discharge status: Home / Self Care | Payer: Commercial Managed Care - PPO | Attending: Emergency Medicine | Admitting: Emergency Medicine

## 2023-05-30 ENCOUNTER — Other Ambulatory Visit: Payer: Self-pay

## 2023-05-30 ENCOUNTER — Other Ambulatory Visit (HOSPITAL_COMMUNITY): Payer: Self-pay

## 2023-05-30 VITALS — BP 150/101 | HR 62 | Ht 68.0 in | Wt 186.0 lb

## 2023-05-30 DIAGNOSIS — I10 Essential (primary) hypertension: Secondary | ICD-10-CM | POA: Diagnosis not present

## 2023-05-30 DIAGNOSIS — Z21 Asymptomatic human immunodeficiency virus [HIV] infection status: Secondary | ICD-10-CM | POA: Diagnosis not present

## 2023-05-30 DIAGNOSIS — R0789 Other chest pain: Secondary | ICD-10-CM | POA: Insufficient documentation

## 2023-05-30 DIAGNOSIS — R079 Chest pain, unspecified: Secondary | ICD-10-CM

## 2023-05-30 DIAGNOSIS — Z79899 Other long term (current) drug therapy: Secondary | ICD-10-CM | POA: Diagnosis not present

## 2023-05-30 DIAGNOSIS — Z7982 Long term (current) use of aspirin: Secondary | ICD-10-CM | POA: Insufficient documentation

## 2023-05-30 LAB — CBC
HCT: 47.9 % (ref 39.0–52.0)
Hemoglobin: 16.8 g/dL (ref 13.0–17.0)
MCH: 31.5 pg (ref 26.0–34.0)
MCHC: 35.1 g/dL (ref 30.0–36.0)
MCV: 89.7 fL (ref 80.0–100.0)
Platelets: 174 10*3/uL (ref 150–400)
RBC: 5.34 MIL/uL (ref 4.22–5.81)
RDW: 13.8 % (ref 11.5–15.5)
WBC: 9.2 10*3/uL (ref 4.0–10.5)
nRBC: 0 % (ref 0.0–0.2)

## 2023-05-30 LAB — BASIC METABOLIC PANEL
Anion gap: 11 (ref 5–15)
BUN: 16 mg/dL (ref 8–23)
CO2: 28 mmol/L (ref 22–32)
Calcium: 9.5 mg/dL (ref 8.9–10.3)
Chloride: 101 mmol/L (ref 98–111)
Creatinine, Ser: 1.4 mg/dL — ABNORMAL HIGH (ref 0.61–1.24)
GFR, Estimated: 56 mL/min — ABNORMAL LOW (ref >60)
Glucose, Bld: 78 mg/dL (ref 70–99)
Potassium: 3.6 mmol/L (ref 3.5–5.1)
Sodium: 140 mmol/L (ref 135–145)

## 2023-05-30 LAB — TROPONIN I (HIGH SENSITIVITY): Troponin I (High Sensitivity): 4 ng/L (ref <18)

## 2023-05-30 MED ORDER — ONDANSETRON HCL 8 MG PO TABS
8.0000 mg | ORAL_TABLET | Freq: Once | ORAL | Status: AC
Start: 2023-05-30 — End: 2023-05-30
  Administered 2023-05-30: 8 mg via ORAL

## 2023-05-30 MED ORDER — HYDROMORPHONE HCL 1 MG/ML IJ SOLN
1.0000 mg | Freq: Once | INTRAMUSCULAR | Status: AC
  Administered 2023-05-30: 1 mg via INTRAVENOUS
  Filled 2023-05-30: qty 1

## 2023-05-30 MED ORDER — ONDANSETRON HCL 4 MG/2ML IJ SOLN
4.0000 mg | Freq: Once | INTRAMUSCULAR | Status: AC
  Administered 2023-05-30: 4 mg via INTRAVENOUS
  Filled 2023-05-30: qty 2

## 2023-05-30 MED ORDER — NITROGLYCERIN 0.4 MG SL SUBL
0.4000 mg | SUBLINGUAL_TABLET | SUBLINGUAL | Status: DC | PRN
  Administered 2023-05-30: 0.4 mg via SUBLINGUAL

## 2023-05-30 MED ORDER — FAMOTIDINE IN NACL 20-0.9 MG/50ML-% IV SOLN
20.0000 mg | Freq: Once | INTRAVENOUS | Status: AC
  Administered 2023-05-30: 20 mg via INTRAVENOUS
  Filled 2023-05-30: qty 50

## 2023-05-30 MED ORDER — ONDANSETRON 4 MG PO TBDP
4.0000 mg | ORAL_TABLET | Freq: Once | ORAL | Status: DC
Start: 2023-05-30 — End: 2023-05-30

## 2023-05-30 MED ORDER — DIAZEPAM 2 MG PO TABS
2.0000 mg | ORAL_TABLET | Freq: Three times a day (TID) | ORAL | 0 refills | Status: DC | PRN
  Filled 2023-05-30: qty 15, 5d supply, fill #0

## 2023-05-30 MED ORDER — GLUCAGON HCL RDNA (DIAGNOSTIC) 1 MG IJ SOLR
1.0000 mg | Freq: Once | INTRAMUSCULAR | Status: AC
  Administered 2023-05-30: 1 mg via INTRAVENOUS
  Filled 2023-05-30: qty 1

## 2023-05-30 NOTE — Progress Notes (Signed)
Subjective:   Bruce Woods 1961/08/11 05/30/2023  Chief Complaint  Patient presents with   Chest Pain    Pt here for having chest pain radiating in the back     HPI: Patient with a significant history for TIA, hypertension, HIV, Sarcoidosis presents to our office for acute onset of chest pain.  Patient states this pain began over the weekend and reports that it felt like a "pressure in his chest".  Patient states that the pain radiates to his back and causes significant nausea and diaphoresis.  Patient states over the weekend he did take 1 alprazolam with resolution of pain and anxiety. He reports previously that similar chest pain had resolved with Nitroglycerin. He was seen recently in ER in April 2024 for chest pain with workup and was thought to be related to uncontrolled GERD.  Patient reports he had a coronary CT in May 2024 with no significant cardiac disease present.  His coronary calcium score was 28.  He recently saw Dr. Dorothea Ogle [place] on May 15, 2023 to address his hypertension and had his losartan increased to 100 mg.  He states over the weekend his blood pressure was in the systolic range in the 200s and diastolic around 100-110.  Upon arrival to our office patient states he has been having chest pain on and off.  States that pain improves when he leans forward into a tripod position.  Denies shortness of breath, nausea upon arrival.  EKG and nitroglycerin tablet was ordered per this PCP upon assessment of the patient to evaluate patient's cardiac rhythm for ischemia and to treat possible cardiac ischemia with nitroglycerin.  Within 3 to 5 minutes of assessment by PCP, the patient became diaphoretic, nauseous and started pacing in the exam room making it impossible for the CMA to complete an EKG.  Zofran 4 mg ODT was provided to patient as CMA was obtaining nitroglycerin tablet.  Emergency department was contacted by security to bring patient downstairs for evaluation of acute chest  pain given patient's instability and presentation.  Patient was agreeable and was transported by wheelchair, emergency room RN, RT and security to [facility].   The following portions of the patient's history were reviewed and updated as appropriate: past medical history, past surgical history, family history, social history, allergies, medications, and problem list.   Patient Active Problem List   Diagnosis Date Noted   Hypokalemia 01/03/2023   TIA (transient ischemic attack) 01/02/2023   Elevated serum creatinine 10/05/2022   Hiatal hernia 09/13/2022   S/P Nissen fundoplication (without gastrostomy tube) procedure 09/13/2022   Vitamin D deficiency 06/21/2022   Basal cell carcinoma 12/16/2021   Hypertension, essential 10/27/2020   Anxiety 10/14/2020   PCP NOTES >>>>>>>>>>>>>>>>>>>>>>>>> 01/01/2016   Insomnia 11/19/2013   Wellness examination 10/01/2013   Fatigue, low vit D 08/19/2011   Erectile dysfunction 08/19/2011   Depression 01/03/2007   Human immunodeficiency virus (HIV) disease (HCC) 08/18/2006   SARCOIDOSIS 08/18/2006   Past Medical History:  Diagnosis Date   Allergy    Angio-edema suspected of small bowel question due to amlodipine 06/02/2022   Anxiety    Complication of anesthesia 07/28/2022   oxygen desaturations to 60's during EGD, bronchospasm after scope removed, reported being told he has suspected undiagnosed OSA   COVID-19 07/2021   DEPRESSION    Gastritis and gastroduodenitis    GERD (gastroesophageal reflux disease)    History of hiatal hernia    HIV DISEASE  Hypertension    IBS (irritable bowel syndrome)    Sarcoidosis 2000   question of   Sleep apnea    Ulnar nerve compression, right    Past Surgical History:  Procedure Laterality Date   COLONOSCOPY  2010   ESOPHAGOGASTRODUODENOSCOPY  2002   HERNIA REPAIR     LUNG SURGERY  10/31/1998   vats   TONSILLECTOMY AND ADENOIDECTOMY     ULNAR NERVE TRANSPOSITION Right  05/17/2018   Procedure: RIGHT ULNAR NERVE DECOMPRESSION/TRANSPOSITION;  Surgeon: Betha Loa, MD;  Location: [facility];  Service: Orthopedics;  Laterality: Right;   XI ROBOTIC ASSISTED HIATAL HERNIA REPAIR N/A 09/13/2022   Procedure: XI ROBOTIC ASSISTED HIATAL HERNIA REPAIR WITH FUNDOPLICATION WITH MESH;  Surgeon: Axel Filler, MD;  Location: [facility] OR;  Service: General;  Laterality: N/A;   Family History  Problem Relation Age of Onset   Hyperlipidemia Mother    Hypertension Mother    Cancer Mother        glioblastoma   Sudden death Neg Hx    Heart attack Neg Hx    Diabetes Neg Hx    Colon cancer Neg Hx    Prostate cancer Neg Hx    Facility-Administered Medications Prior to Visit  Medication Dose Route Frequency Provider Last Rate Last Admin   ondansetron (ZOFRAN) 4 mg in sodium chloride 0.9 % 50 mL IVPB  4 mg Intravenous Once Cirigliano, Vito V, DO       Outpatient Medications Prior to Visit  Medication Sig Dispense Refill   anastrozole (ARIMIDEX) 1 MG tablet Take 1 tablet (1 mg total) by mouth once a week. 20 tablet 1   aspirin EC 81 MG tablet Take 1 tablet (81 mg total) by mouth daily. Swallow whole. 90 tablet 1   bictegravir-emtricitabine-tenofovir AF (BIKTARVY) 50-200-25 MG TABS tablet Take 1 tablet by mouth daily. 30 tablet 11   citalopram (CELEXA) 20 MG tablet Take 1 tablet (20 mg total) by mouth daily. 90 tablet 3   losartan (COZAAR) 100 MG tablet Take 1 tablet (100 mg total) by mouth daily. 30 tablet 1   rosuvastatin (CRESTOR) 20 MG tablet Take 1 tablet (20 mg total) by mouth daily. 90 tablet 1   tadalafil (CIALIS) 5 MG tablet Take 1 tablet (5 mg total) by mouth daily. 90 tablet 3   testosterone cypionate (DEPOTESTOSTERONE CYPIONATE) 200 MG/ML injection Inject 0.5 mLs (100 mg total) into the muscle once a week. 10 mL 0   testosterone cypionate (DEPOTESTOSTERONE CYPIONATE) 200 MG/ML injection Inject 0.5 mLs (100 mg total) into the muscle once a week. 10 mL 0    zolpidem (AMBIEN) 5 MG tablet Take 1 tablet (5 mg total) by mouth at bedtime as needed for sleep. 30 tablet 1   Allergies  Allergen Reactions   Amlodipine Besylate Swelling    Suspected angioedema of bowel   Cepacol [Benzocaine-Menthol] Anaphylaxis   Morphine Anaphylaxis and Other (See Comments)    Resp distress, rash, tachycardia   Vibramycin [Doxycycline] Nausea And Vomiting   Celebrex [Celecoxib] Other (See Comments)    Elevated LFT's   Wellbutrin [Bupropion] Hives     ROS: A complete ROS was performed with pertinent positives/negatives noted in the HPI. The remainder of the ROS are negative.    Objective:   Today's Vitals   05/30/23 1328  BP: (!) 150/101  Pulse: 62  SpO2: 100%  Weight: 186 lb (84.4 kg)  Height: 5\' 8"  (1.727 m)    Physical Exam  GENERAL: Patient non-toxic in appearance and NAD. Well nourished.  SKIN: Pink, warm and dry. No rash, lesion, ulceration, or ecchymoses.  Head: Normocephalic. NECK: Trachea midline. Full ROM w/o pain or tenderness. No lymphadenopathy.  RESPIRATORY: Chest wall symmetrical. Respirations even and non-labored. Breath sounds clear to auscultation bilaterally.  CARDIAC: S1, S2 present, regular rate and rhythm without murmur or gallops. Peripheral pulses 2+ bilaterally. No reproducible pain upon palpation. MSK: Muscle tone and strength appropriate for age. Joints w/o tenderness, redness, or swelling.  GI: Abdomen soft, non-tender. Normoactive bowel sounds. No rebound tenderness. No hepatomegaly or splenomegaly. No CVA tenderness.  EXTREMITIES: Without clubbing, cyanosis, or edema. NEUROLOGIC: No motor or sensory deficits. Steady, even gait. C2-C12 intact.  PSYCH/MENTAL STATUS: Alert, oriented x 3. Cooperative, appropriate mood and affect.   The ASCVD Risk score (Arnett DK, et al., 2019) failed to calculate for the following reasons:   The valid total cholesterol range is 130 to 320 mg/dL     Assessment & Plan:  1.  Acute chest pain Given patient's presentation and progression of symptoms, patient was transported to ER prior to Primary Care being able to complete EKG and nitroglycerin administration. Zofran 4mg  ODT given in office. Patient transported to [facility] ER for further evaluation, stabilization and treatment. Pt was agreeable to further escalation of care.  - ondansetron (ZOFRAN) tablet 8 mg   Meds ordered this encounter  Medications   DISCONTD: ondansetron (ZOFRAN-ODT) disintegrating tablet 4 mg   ondansetron (ZOFRAN) tablet 8 mg    Return for As Scheduled.    Patient to reach out to office if new, worrisome, or unresolved symptoms arise or if no improvement in patient's condition. Patient verbalized understanding and is agreeable to treatment plan. All questions answered to patient's satisfaction.   Of note, portions of this note may have been created with voice recognition software [employer]ic[employer], medical). While this note has been edited for accuracy, occasional wrong-word or 'sound-a-like' substitutions may have occurred due to the inherent limitations of voice recognition software.  Yolanda Manges, FNP

## 2023-05-30 NOTE — Telephone Encounter (Signed)
Inbound call from patient stating he is at ED and states provider is very concerned about Esophageal spasms patient is experiencing due to constant belching and nausea. Requesting a call back to discuss further and a possible endoscopy. Please advise, thank you.

## 2023-05-30 NOTE — ED Notes (Signed)
Pt verbalized understanding of d/c instructions, meds, and followup care. Denies questions. VSS, no distress noted. Steady gait to exit with all belongings.  ?

## 2023-05-30 NOTE — ED Triage Notes (Signed)
Pt was at PCP this AM, called ED for transport down. Pt reports episode of mid-chest pain, fell back asleep. Yesterday, CP through ribs, tunnel vision.  Last night, similar episode, took ativan & 324 ASA   Lightheaded, weak all day. Pt w nausea, given zofran at PCP. Pt reports improvement in CP when he leans forward.   No NTG PTA

## 2023-05-30 NOTE — ED Provider Notes (Signed)
[facility] DEPART[facility] AT [facility] Provider Note   CSN: [id number] Arrival date & time: 05/30/23  1350     History  Chief Complaint  Patient presents with   Chest Pain    Bruce Woods is a 62 y.o. male with history of HIV, hypertension, high cholesterol, presented to ED with complaint of substernal chest pressure.  Patient reports he is having crushing pressure substernal, which has been a recurring issue for the past several months.  This can happen at random.  He often feels extremely fatigued.  In this particular case he is feeling extremely nauseated, belching, lightheaded.  He reports he is having significant pressure in his chest.  Patient was referred to cardiology earlier this year, and approximately 03/20/23 had coronary CT with very mild on the Rectiv CAD, 39th percentile with coronary calcium score of 28.  No abnormal extra thoracic findings.  Echo 01/03/23 with normal EF, grade 1 diastolic dysfunction, no significant valvular disease  HPI     Home Medications Prior to Admission medications   Medication Sig Start Date End Date Taking? Authorizing Provider  diazepam (VALIUM) 2 MG tablet Take 1 tablet (2 mg total) by mouth every 8 (eight) hours as needed for up to 15 doses for muscle spasms (For esophageal spasm). 05/30/23  Yes Demika Langenderfer, Kermit Balo, MD  anastrozole (ARIMIDEX) 1 MG tablet Take 1 tablet (1 mg total) by mouth once a week. 04/09/23     aspirin EC 81 MG tablet Take 1 tablet (81 mg total) by mouth daily. Swallow whole. 04/14/23   de [place], Buren Kos, MD  bictegravir-emtricitabine-tenofovir AF (BIKTARVY) 50-200-25 MG TABS tablet Take 1 tablet by mouth daily. 10/05/22   Cliffton Asters, MD  citalopram (CELEXA) 20 MG tablet Take 1 tablet (20 mg total) by mouth daily. 02/05/23   Eulis Foster, FNP  losartan (COZAAR) 100 MG tablet Take 1 tablet (100 mg total) by mouth daily. 05/15/23   de [place], Buren Kos, MD  rosuvastatin (CRESTOR) 20 MG tablet Take 1  tablet (20 mg total) by mouth daily. 04/14/23   de [place], Buren Kos, MD  tadalafil (CIALIS) 5 MG tablet Take 1 tablet (5 mg total) by mouth daily. 04/24/23     testosterone cypionate (DEPOTESTOSTERONE CYPIONATE) 200 MG/ML injection Inject 0.5 mLs (100 mg total) into the muscle once a week. 01/04/23     testosterone cypionate (DEPOTESTOSTERONE CYPIONATE) 200 MG/ML injection Inject 0.5 mLs (100 mg total) into the muscle once a week. 05/14/23     zolpidem (AMBIEN) 5 MG tablet Take 1 tablet (5 mg total) by mouth at bedtime as needed for sleep. 01/16/23   de [place], Raymond J, MD      Allergies    Amlodipine besylate, Cepacol [benzocaine-menthol], Morphine, Vibramycin [doxycycline], Celebrex [celecoxib], and Wellbutrin [bupropion]    Review of Systems   Review of Systems  Physical Exam Updated Vital Signs BP 139/85   Pulse (!) 52   Temp 98.5 F (36.9 C)   Resp 13   SpO2 100%  Physical Exam Constitutional:      General: He is not in acute distress.    Comments: Patient is hunched over, continuously belching  HENT:     Head: Normocephalic and atraumatic.  Eyes:     Conjunctiva/sclera: Conjunctivae normal.     Pupils: Pupils are equal, round, and reactive to light.  Cardiovascular:     Rate and Rhythm: Normal rate and regular rhythm.  Pulmonary:     Effort: Pulmonary effort  is normal. No respiratory distress.  Abdominal:     General: There is no distension.     Tenderness: There is no abdominal tenderness.  Skin:    General: Skin is warm and dry.  Neurological:     General: No focal deficit present.     Mental Status: He is alert and oriented to person, place, and time. Mental status is at baseline.  Psychiatric:        Mood and Affect: Mood normal.        Behavior: Behavior normal.     ED Results / Procedures / Treatments   Labs (all labs ordered are listed, but only abnormal results are displayed) Labs Reviewed  BASIC METABOLIC PANEL - Abnormal; Notable for the following  components:      Result Value   Creatinine, Ser 1.40 (*)    GFR, Estimated 56 (*)    All other components within normal limits  CBC  TROPONIN I (HIGH SENSITIVITY)  TROPONIN I (HIGH SENSITIVITY)    EKG EKG Interpretation Date/Time:  Tuesday May 30 2023 13:53:30 EDT Ventricular Rate:  63 PR Interval:  210 QRS Duration:  89 QT Interval:  416 QTC Calculation: 426 R Axis:   -25  Text Interpretation: Sinus rhythm Abnormal R-wave progression, early transition Inferior infarct, old Confirmed by Alvester Chou [phone]) on 05/30/2023 2:11:30 PM  Radiology DG Chest Portable 1 View  Result Date: 05/30/2023 CLINICAL DATA:  chest pressure and pain EXAM: PORTABLE CHEST 1 VIEW COMPARISON:  CXR 02/28/23 FINDINGS: No pleural effusion. No pneumothorax. No focal airspace opacity. No radiographically apparent displaced rib fractures. Visualized upper abdomen is unremarkable. IMPRESSION: No focal airspace opacity Electronically Signed   By: Lorenza Cambridge M.D.   On: 05/30/2023 14:43    Procedures Procedures    Medications Ordered in ED Medications  nitroGLYCERIN (NITROSTAT) SL tablet 0.4 mg (0.4 mg Sublingual Given 05/30/23 1401)  HYDROmorphone (DILAUDID) injection 1 mg (1 mg Intravenous Given 05/30/23 1418)  glucagon (human recombinant) (GLUCAGEN) injection 1 mg (1 mg Intravenous Given 05/30/23 1420)  famotidine (PEPCID) IVPB 20 mg premix (0 mg Intravenous Stopped 05/30/23 1516)  ondansetron (ZOFRAN) injection 4 mg (4 mg Intravenous Given 05/30/23 1420)    ED Course/ Medical Decision Making/ A&P Clinical Course as of 05/30/23 1542  Tue May 30, 2023  1509 Pt is completely pain free at this time. [MT]    Clinical Course User Index [MT] Ayse Mccartin, Kermit Balo, MD                                 Medical Decision Making Amount and/or Complexity of Data Reviewed Labs: ordered. Radiology: ordered.  Risk Prescription drug management.   This patient presents to the ED with concern for substernal  chest pressure and pain, belching, nausea, lightheadedness. This involves an extensive number of treatment options, and is a complaint that carries with it a high risk of complications and morbidity.  The differential diagnosis includes ACS versus pneumonia versus esophageal spasm versus other.   Co-morbidities that complicate the patient evaluation: Hypertension hyperlipidemia risk factors for cardiovascular disease   External records from outside source obtained and reviewed including outpatient cardiac workup including coronary CT scan and echocardiogram from earlier this year  I ordered and personally interpreted labs.  The pertinent results include: Labs are unremarkable  I ordered imaging studies including x-ray of the chest I independently visualized and interpreted imaging which showed no emergent findings  I agree with the radiologist interpretation  The patient was maintained on a cardiac monitor.  I personally viewed and interpreted the cardiac monitored which showed an underlying rhythm of: Sinus rhythm and occasional sinus bradycardia  Per my interpretation the patient's ECG shows sinus rhythm no acute ischemic findings  I ordered medication including IV glucagon for suspected esophageal spasm, IV Dilaudid for pain, IV Pepcid for suspected gastritis  I have reviewed the patients home medicines and have made adjustments as needed  Test Considered: Given the recurrence and frequency of the patient's symptoms over the past several months, I have a lower suspicion for acute aortic dissection or PE.  There is no mention of aortic aneurysm on prior imaging.  No significant valvular disease.  After the interventions noted above, I reevaluated the patient and found that they have: improved - pain free   Dispostion:  After consideration of the diagnostic results and the patients response to treatment, I feel that the patent would benefit from outpatient follow-up with cardiology and  gastroenterology.  I suspect this is most likely related to an esophageal dysmotility issue, as the patient appears to be having significant reflux type symptoms, as well as persistent belching that he feels is relieving his symptoms.  We discussed a small dose of Valium which may be helpful for this specific etiology.  I have sent secure messages to his gastroenterologist as well as the cardiologist who saw him earlier this year.  I also wonder about potential coronary vasospasm?  I do not see evidence of ACS at this time, do not believe he is requiring emergent hospitalization.  The patient is comfortable going home with follow-up.         Final Clinical Impression(s) / ED Diagnoses Final diagnoses:  Chest pain, unspecified type    Rx / DC Orders ED Discharge Orders          Ordered    diazepam (VALIUM) 2 MG tablet  Every 8 hours PRN        05/30/23 1511              Terald Sleeper, MD 05/30/23 [phone]

## 2023-05-31 ENCOUNTER — Other Ambulatory Visit: Payer: Self-pay

## 2023-05-31 NOTE — Telephone Encounter (Signed)
Pt  went to ER yesterday  and was diagnosed with Esophageal Spasms. Referred back to GI. Pt stated that the ER Dr. sent Dr Leone Payor a message a bout pt.  Pt was questioning if an EGD should be added to Colonoscopy.Pt currently scheduled for an office visit on 06/20/2023.   Pt also questioned if something different besides the valium 2 mg could be prescribed due to the fact that it knocked him out.  Pt was notified that Dr. Leone Payor is out of the office the rest of the week.  Please review and advise.

## 2023-06-04 NOTE — Telephone Encounter (Signed)
I reviewed ED notes  Does not look possible to do a double for a long time  We could do an EGD soon instead of a colonoscopy and set up a colonoscopy later  I think that makes most sense

## 2023-06-05 ENCOUNTER — Other Ambulatory Visit (HOSPITAL_COMMUNITY): Payer: Self-pay

## 2023-06-06 NOTE — Telephone Encounter (Signed)
Pt made aware of Dr. Leone Payor recommendations: Pt colonoscopy was canceled and EGD was scheduled for the same date and time, Pt made aware. Pt has previsit appointment tomorrow that's was previous scheduled. Pt is aware. Pt verbalized understanding with all questions answered.

## 2023-06-07 ENCOUNTER — Encounter: Payer: Self-pay | Admitting: Internal Medicine

## 2023-06-07 ENCOUNTER — Ambulatory Visit (AMBULATORY_SURGERY_CENTER): Payer: Commercial Managed Care - PPO

## 2023-06-07 VITALS — Ht 68.0 in | Wt 183.0 lb

## 2023-06-07 DIAGNOSIS — K224 Dyskinesia of esophagus: Secondary | ICD-10-CM

## 2023-06-07 NOTE — Progress Notes (Signed)
Pre visit completed via phone call; Patient verified name, DOB, and address;  No egg or soy allergy known to patient;  No issues known to pt with past sedation with any surgeries or procedures- undx sleep apnea with previous procedures-no issues Patient denies ever being told they had issues or difficulty with intubation;  No FH of Malignant Hyperthermia; Pt is not on diet pills; Pt is not on home 02;  Pt is not on blood thinners;  Pt denies issues with constipation;  No A fib or A flutter;  Have any cardiac testing pending--NO Insurance verified during PV appt--- [facility]   Pt can ambulate without assistance;  Pt denies use of chewing tobacco; Discussed diabetic/weight loss medication holds; Discussed NSAID holds; Checked BMI to be less than 50; Pt instructed to use Single[employer] or GoodRx for a price reduction on prep  Patient's chart reviewed by Cathlyn Parsons CNRA prior to previsit and patient appropriate for the [facility].  Pre visit completed and red dot placed by patient's name on their procedure day (on provider's schedule).    Instructions sent to patient via MyChart per his request;

## 2023-06-09 ENCOUNTER — Other Ambulatory Visit (HOSPITAL_BASED_OUTPATIENT_CLINIC_OR_DEPARTMENT_OTHER): Payer: Commercial Managed Care - PPO

## 2023-06-11 ENCOUNTER — Other Ambulatory Visit (HOSPITAL_BASED_OUTPATIENT_CLINIC_OR_DEPARTMENT_OTHER): Payer: Self-pay

## 2023-06-12 ENCOUNTER — Other Ambulatory Visit (HOSPITAL_BASED_OUTPATIENT_CLINIC_OR_DEPARTMENT_OTHER): Payer: Self-pay

## 2023-06-12 ENCOUNTER — Other Ambulatory Visit (HOSPITAL_BASED_OUTPATIENT_CLINIC_OR_DEPARTMENT_OTHER): Payer: Commercial Managed Care - PPO

## 2023-06-12 DIAGNOSIS — I10 Essential (primary) hypertension: Secondary | ICD-10-CM | POA: Diagnosis not present

## 2023-06-12 LAB — BASIC METABOLIC PANEL
BUN/Creatinine Ratio: 7 — ABNORMAL LOW (ref 10–24)
BUN: 10 mg/dL (ref 8–27)
CO2: 26 mmol/L (ref 20–29)
Calcium: 9.7 mg/dL (ref 8.6–10.2)
Chloride: 102 mmol/L (ref 96–106)
Creatinine, Ser: 1.38 mg/dL — ABNORMAL HIGH (ref 0.76–1.27)
Glucose: 97 mg/dL (ref 70–99)
Potassium: 3.9 mmol/L (ref 3.5–5.2)
Sodium: 142 mmol/L (ref 134–144)
eGFR: 57 mL/min/{1.73_m2} — ABNORMAL LOW (ref >59)

## 2023-06-13 ENCOUNTER — Other Ambulatory Visit: Payer: Self-pay

## 2023-06-13 ENCOUNTER — Other Ambulatory Visit (HOSPITAL_BASED_OUTPATIENT_CLINIC_OR_DEPARTMENT_OTHER): Payer: Self-pay

## 2023-06-20 ENCOUNTER — Encounter: Payer: Self-pay | Admitting: Internal Medicine

## 2023-06-20 ENCOUNTER — Encounter: Payer: Commercial Managed Care - PPO | Admitting: Internal Medicine

## 2023-06-20 ENCOUNTER — Ambulatory Visit (AMBULATORY_SURGERY_CENTER): Payer: Commercial Managed Care - PPO | Admitting: Internal Medicine

## 2023-06-20 VITALS — BP 147/83 | HR 47 | Temp 98.9°F | Resp 11 | Ht 68.0 in | Wt 183.0 lb

## 2023-06-20 DIAGNOSIS — R1319 Other dysphagia: Secondary | ICD-10-CM | POA: Diagnosis not present

## 2023-06-20 DIAGNOSIS — F419 Anxiety disorder, unspecified: Secondary | ICD-10-CM | POA: Diagnosis not present

## 2023-06-20 DIAGNOSIS — R079 Chest pain, unspecified: Secondary | ICD-10-CM | POA: Diagnosis not present

## 2023-06-20 DIAGNOSIS — R1310 Dysphagia, unspecified: Secondary | ICD-10-CM | POA: Diagnosis not present

## 2023-06-20 DIAGNOSIS — I10 Essential (primary) hypertension: Secondary | ICD-10-CM | POA: Diagnosis not present

## 2023-06-20 DIAGNOSIS — F32A Depression, unspecified: Secondary | ICD-10-CM | POA: Diagnosis not present

## 2023-06-20 DIAGNOSIS — G4730 Sleep apnea, unspecified: Secondary | ICD-10-CM | POA: Diagnosis not present

## 2023-06-20 MED ORDER — SODIUM CHLORIDE 0.9 % IV SOLN: 500.0000 mL | INTRAVENOUS | Status: DC

## 2023-06-20 NOTE — Progress Notes (Signed)
Called to room to assist during endoscopic procedure.  Patient ID and intended procedure confirmed with present staff. Received instructions for my participation in the procedure from the performing physician.  

## 2023-06-20 NOTE — Progress Notes (Signed)
Sedate, gd SR, tolerated procedure well, VSS, report to RN 

## 2023-06-20 NOTE — Patient Instructions (Addendum)
I did not see any abnormality - I did go ahead and dilate the esophagus to see if that helps.  Let us see how that goes - if you continue to have problems I think adding trazodone as a medication would make sense. It helps these symptoms.  Stay on clear liquids until 1130 today then try the shakes. Tomorrow you can see if you can eat solid food again.  Please reschedule colonoscopy at your convenience.  I appreciate the opportunity to care for you. Iva Boop, MD, [facility]   Handout on post dilation diet given to patient   YOU HAD AN ENDOSCOPIC PROCEDURE TODAY AT THE [facility]:   Refer to the procedure report that was given to you for any specific questions about what was found during the examination.  If the procedure report does not answer your questions, please call your gastroenterologist to clarify.  If you requested that your care partner not be given the details of your procedure findings, then the procedure report has been included in a sealed envelope for you to review at your convenience later.  YOU SHOULD EXPECT: Some feelings of bloating in the abdomen. Passage of more gas than usual.  Walking can help get rid of the air that was put into your GI tract during the procedure and reduce the bloating. If you had a lower endoscopy (such as a colonoscopy or flexible sigmoidoscopy) you may notice spotting of blood in your stool or on the toilet paper. If you underwent a bowel prep for your procedure, you may not have a normal bowel movement for a few days.  Please Note:  You might notice some irritation and congestion in your nose or some drainage.  This is from the oxygen used during your procedure.  There is no need for concern and it should clear up in a day or so.  SYMPTOMS TO REPORT IMMEDIATELY:  Following upper endoscopy (EGD)  Vomiting of blood or coffee ground material  New chest pain or pain under the shoulder blades  Painful or persistently difficult  swallowing  New shortness of breath  Fever of 100F or higher  Black, tarry-looking stools  For urgent or emergent issues, a gastroenterologist can be reached at any hour by calling [phone]. Do not use MyChart messaging for urgent concerns.    DIET:  Post-dilation diet today (1 hour of clear liquids and then soft diet the remainder of the day as tolerated).  Drink plenty of fluids but you should avoid alcoholic beverages for 24 hours.  ACTIVITY:  You should plan to take it easy for the rest of today and you should NOT DRIVE or use heavy machinery until tomorrow (because of the sedation medicines used during the test).    FOLLOW UP: Our staff will call the number listed on your records the next business day following your procedure.  We will call around 7:15- 8:00 am to check on you and address any questions or concerns that you may have regarding the information given to you following your procedure. If we do not reach you, we will leave a message.     If any biopsies were taken you will be contacted by phone or by letter within the next 1-3 weeks.  Please call us at [phone] if you have not heard about the biopsies in 3 weeks.    SIGNATURES/CONFIDENTIALITY: You and/or your care partner have signed paperwork which will be entered into your electronic medical record.  These signatures  attest to the fact that that the information above on your After Visit Summary has been reviewed and is understood.  Full responsibility of the confidentiality of this discharge information lies with you and/or your care-partner.

## 2023-06-20 NOTE — Progress Notes (Signed)
[facility] History and Physical   Primary Care Physician:  de [place], Buren Kos, MD   Reason for Procedure:   Chest pain dysphagiua  Plan:    EGD     HPI: Bruce Woods is a 62 y.o. male w/ chest pain and ? Of esophageal issues. Also w/ dysphagia and odynphagia sxs   Past Medical History:  Diagnosis Date   Allergy    Angio-edema suspected of small bowel question due to amlodipine 06/02/2022   Anxiety    Complication of anesthesia 07/28/2022   oxygen desaturations to 60's during EGD, bronchospasm after scope removed, reported being told he has suspected undiagnosed OSA   COVID-19 07/2021   DEPRESSION    Gastritis and gastroduodenitis    GERD (gastroesophageal reflux disease)    History of hiatal hernia    HIV DISEASE    Hypertension    IBS (irritable bowel syndrome)    hx of   Sarcoidosis 2000   question of   Sleep apnea    TIA (transient ischemic attack) 12/2022   occipital TIA-NO residual effects   Ulnar nerve compression, right     Past Surgical History:  Procedure Laterality Date   COLONOSCOPY  2010   [place], [place]-unknown prep(exc)-normal   ESOPHAGOGASTRODUODENOSCOPY  2002   HERNIA REPAIR     LUNG SURGERY  10/31/1998   vats   TONSILLECTOMY AND ADENOIDECTOMY     ULNAR NERVE TRANSPOSITION Right 05/17/2018   Procedure: RIGHT ULNAR NERVE DECOMPRESSION/TRANSPOSITION;  Surgeon: Betha Loa, MD;  Location: [facility];  Service: Orthopedics;  Laterality: Right;   XI ROBOTIC ASSISTED HIATAL HERNIA REPAIR N/A 09/13/2022   Procedure: XI ROBOTIC ASSISTED HIATAL HERNIA REPAIR WITH FUNDOPLICATION WITH MESH;  Surgeon: Axel Filler, MD;  Location: [facility] OR;  Service: General;  Laterality: N/A;    Prior to Admission medications   Medication Sig Start Date End Date Taking? Authorizing Provider  bictegravir-emtricitabine-tenofovir AF (BIKTARVY) 50-200-25 MG TABS tablet Take 1 tablet by mouth daily. 10/05/22  Yes Cliffton Asters, MD   citalopram (CELEXA) 20 MG tablet Take 1 tablet (20 mg total) by mouth daily. 02/05/23  Yes Worthy Rancher B, FNP  diazepam (VALIUM) 2 MG tablet Take 1 tablet (2 mg total) by mouth every 8 (eight) hours as needed for up to 15 doses for muscle spasms (For esophageal spasm). 05/30/23  Yes Terald Sleeper, MD  losartan (COZAAR) 100 MG tablet Take 1 tablet (100 mg total) by mouth daily. 05/15/23  Yes de [place], Raymond J, MD  rosuvastatin (CRESTOR) 20 MG tablet Take 1 tablet (20 mg total) by mouth daily. 04/14/23  Yes de [place], Raymond J, MD  zolpidem (AMBIEN) 5 MG tablet Take 1 tablet (5 mg total) by mouth at bedtime as needed for sleep. 01/16/23  Yes de [place], Raymond J, MD  anastrozole (ARIMIDEX) 1 MG tablet Take 1 tablet (1 mg total) by mouth once a week. 04/09/23     aspirin EC 81 MG tablet Take 1 tablet (81 mg total) by mouth daily. Swallow whole. 04/14/23   de [place], Buren Kos, MD  tadalafil (CIALIS) 5 MG tablet Take 1 tablet (5 mg total) by mouth daily. 04/24/23     testosterone cypionate (DEPOTESTOSTERONE CYPIONATE) 200 MG/ML injection Inject 0.5 mLs (100 mg total) into the muscle once a week. 01/04/23     testosterone cypionate (DEPOTESTOSTERONE CYPIONATE) 200 MG/ML injection Inject 0.5 mLs (100 mg total) into the muscle once a week. Discard vial after use. 05/14/23  Current Outpatient Medications  Medication Sig Dispense Refill   bictegravir-emtricitabine-tenofovir AF (BIKTARVY) 50-200-25 MG TABS tablet Take 1 tablet by mouth daily. 30 tablet 11   citalopram (CELEXA) 20 MG tablet Take 1 tablet (20 mg total) by mouth daily. 90 tablet 3   diazepam (VALIUM) 2 MG tablet Take 1 tablet (2 mg total) by mouth every 8 (eight) hours as needed for up to 15 doses for muscle spasms (For esophageal spasm). 15 tablet 0   losartan (COZAAR) 100 MG tablet Take 1 tablet (100 mg total) by mouth daily. 30 tablet 1   rosuvastatin (CRESTOR) 20 MG tablet Take 1 tablet (20 mg total) by mouth daily. 90 tablet 1   zolpidem  (AMBIEN) 5 MG tablet Take 1 tablet (5 mg total) by mouth at bedtime as needed for sleep. 30 tablet 1   anastrozole (ARIMIDEX) 1 MG tablet Take 1 tablet (1 mg total) by mouth once a week. 20 tablet 1   aspirin EC 81 MG tablet Take 1 tablet (81 mg total) by mouth daily. Swallow whole. 90 tablet 1   tadalafil (CIALIS) 5 MG tablet Take 1 tablet (5 mg total) by mouth daily. 90 tablet 3   testosterone cypionate (DEPOTESTOSTERONE CYPIONATE) 200 MG/ML injection Inject 0.5 mLs (100 mg total) into the muscle once a week. 10 mL 0   testosterone cypionate (DEPOTESTOSTERONE CYPIONATE) 200 MG/ML injection Inject 0.5 mLs (100 mg total) into the muscle once a week. Discard vial after use. 10 mL 0   Current Facility-Administered Medications  Medication Dose Route Frequency Provider Last Rate Last Admin   0.9 %  sodium chloride infusion  500 mL Intravenous Continuous Iva Boop, MD        Allergies as of 06/20/2023 - Review Complete 06/20/2023  Allergen Reaction Noted   Amlodipine besylate Swelling 06/02/2022   Cepacol [benzocaine-menthol] Anaphylaxis 09/13/2022   Morphine Anaphylaxis and Other (See Comments) 08/18/2006   Vibramycin [doxycycline] Nausea And Vomiting 08/18/2006   Celebrex [celecoxib] Other (See Comments) 04/19/2011   Wellbutrin [bupropion] Hives 11/12/2013    Family History  Problem Relation Age of Onset   Hyperlipidemia Mother    Hypertension Mother    Cancer Mother        glioblastoma   Sudden death Neg Hx    Heart attack Neg Hx    Diabetes Neg Hx    Colon cancer Neg Hx    Prostate cancer Neg Hx    Colon polyps Neg Hx     Social History   Socioeconomic History   Marital status: Married    Spouse name: Not on file   Number of children: 2   Years of education: Not on file   Highest education level: Bachelor's degree (e.g., BA, AB, BS)  Occupational History   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: [employer]  Tobacco Use   Smoking status: Former     Current packs/day: 0.00    Average packs/day: 1 pack/day for 2.0 years (2.0 ttl pk-yrs)    Types: Cigarettes    Start date: 10/31/1981    Quit date: 11/01/1983    Years since quitting: 39.6   Smokeless tobacco: Never  Vaping Use   Vaping status: Never Used  Substance and Sexual Activity   Alcohol use: Yes    Alcohol/week: 1.0 standard drink of alcohol    Types: 1 Standard drinks or equivalent per week    Comment: Social   Drug use: No   Sexual activity:  Not Currently    Birth control/protection: Abstinence    Comment: declined condoms  Other Topics Concern   Not on file  Social History Narrative   Married to [place], daughters Jearld Pies   RN PACU, ED   Former smoker no drug use occasional alcohol   Social Determinants of Health   Financial Resource Strain: Low Risk  (05/11/2023)   Overall Financial Resource Strain (CARDIA)    Difficulty of Paying Living Expenses: Not hard at all  Food Insecurity: No Food Insecurity (05/11/2023)   Hunger Vital Sign    Worried About Running Out of Food in the Last Year: Never true    Ran Out of Food in the Last Year: Never true  Transportation Needs: No Transportation Needs (05/11/2023)   PRAPARE - Administrator, Civil Service (Medical): No    Lack of Transportation (Non-Medical): No  Physical Activity: Insufficiently Active (05/11/2023)   Exercise Vital Sign    Days of Exercise per Week: 3 days    Minutes of Exercise per Session: 30 min  Stress: Stress Concern Present (05/11/2023)   [employer] of Occupational Health - Occupational Stress Questionnaire    Feeling of Stress : Rather much  Social Connections: Socially Isolated (05/11/2023)   Social Connection and Isolation Panel [NHANES]    Frequency of Communication with Friends and Family: Once a week    Frequency of Social Gatherings with Friends and Family: Never    Attends Religious Services: Never    Database administrator or Organizations: No    Attends Tax inspector Meetings: Never    Marital Status: Married  Catering manager Violence: Not At Risk (01/03/2023)   Humiliation, Afraid, Rape, and Kick questionnaire    Fear of Current or Ex-Partner: No    Emotionally Abused: No    Physically Abused: No    Sexually Abused: No    Review of Systems:  All other review of systems negative except as mentioned in the HPI.  Physical Exam: Vital signs BP (!) 147/91   Temp 98.9 F (37.2 C)   Ht 5\' 8"  (1.727 m)   Wt 183 lb (83 kg)   SpO2 97%   BMI 27.83 kg/m   General:   Alert,  Well-developed, well-nourished, pleasant and cooperative in NAD Lungs:  Clear throughout to auscultation.   Heart:  Regular rate and rhythm; no murmurs, clicks, rubs,  or gallops. Abdomen:  Soft, nontender and nondistended. Normal bowel sounds.   Neuro/Psych:  Alert and cooperative. Normal mood and affect. A and O x 3   @Syretta Kochel  Sena Slate, MD, [facility] [phone] (pager) 06/20/2023 9:50 AM@

## 2023-06-20 NOTE — Op Note (Signed)
[facility] Patient Name: Bruce Woods Procedure Date: 06/20/2023 9:45 AM MRN: [id number] Endoscopist: Iva Boop , MD, [id number] Age: 62 Referring MD:  Date of Birth: [birth year] Gender: Male Account #: [id number] Procedure:                Upper GI endoscopy Indications:              Dysphagia, Chest pain (non cardiac) Medicines:                Monitored Anesthesia Care Procedure:                Pre-Anesthesia Assessment:                           - Prior to the procedure, a History and Physical                            was performed, and patient medications and                            allergies were reviewed. The patient's tolerance of                            previous anesthesia was also reviewed. The risks                            and benefits of the procedure and the sedation                            options and risks were discussed with the patient.                            All questions were answered, and informed consent                            was obtained. Prior Anticoagulants: The patient has                            taken no anticoagulant or antiplatelet agents. ASA                            Grade Assessment: III - A patient with severe                            systemic disease. After reviewing the risks and                            benefits, the patient was deemed in satisfactory                            condition to undergo the procedure.                           After obtaining informed consent, the endoscope was  passed under direct vision. Throughout the                            procedure, the patient's blood pressure, pulse, and                            oxygen saturations were monitored continuously. The                            Olympus Scope [id number] was introduced through the                            mouth, and advanced to the second part of duodenum.                            The upper  GI endoscopy was accomplished without                            difficulty. The patient tolerated the procedure                            well. Scope In: Scope Out: Findings:                 No endoscopic abnormality was evident in the                            esophagus to explain the patient's complaint of                            dysphagia. It was decided, however, to proceed with                            dilation of the entire esophagus. The scope was                            withdrawn. Dilation was performed with a Maloney                            dilator with mild resistance at 54 Fr. The dilation                            site was examined following endoscope reinsertion                            and showed mild mucosal disruption at the UES.                            Estimated blood loss was minimal.                           Evidence of a Nissen fundoplication was found at  the gastroesophageal junction and in the cardia.                            The wrap appeared intact. This was traversed.                           The exam was otherwise without abnormality.                           The cardia and gastric fundus were normal on                            retroflexion. Complications:            No immediate complications. Estimated Blood Loss:     Estimated blood loss was minimal. Impression:               - No endoscopic esophageal abnormality to explain                            patient's dysphagia. Esophagus dilated w/ 54 Fr                            Maloney and mild mucosal disruptine seen ar UES                            upon reinspection.                           - A Nissen fundoplication was found. The wrap                            appears intact.                           - The examination was otherwise normal.                           - No specimens collected. Recommendation:           - Patient has a contact number  available for                            emergencies. The signs and symptoms of potential                            delayed complications were discussed with the                            patient. Return to normal activities tomorrow.                            Written discharge instructions were provided to the                            patient.                           -  Clear liquids x 1 hour then soft foods rest of                            day. Start prior diet tomorrow.                           - Continue present medications. If this fails to                            help adequately would consider adding trazodone. Iva Boop, MD 06/20/2023 10:16:47 AM This report has been signed electronically.

## 2023-06-21 ENCOUNTER — Telehealth: Payer: Self-pay | Admitting: *Deleted

## 2023-06-21 ENCOUNTER — Ambulatory Visit: Payer: Commercial Managed Care - PPO | Admitting: Behavioral Health

## 2023-06-21 NOTE — Telephone Encounter (Signed)
  Follow up Call-     06/20/2023    9:00 AM 07/28/2022    7:31 AM 10/11/2021    7:38 AM  Call back number  Post procedure Call Back phone  # [phone]  Permission to leave phone message Yes Yes Yes     Patient questions:  Do you have a fever, pain , or abdominal swelling? No. Pain Score  0 *  Have you tolerated food without any problems? Yes.    Have you been able to return to your normal activities? Yes.    Do you have any questions about your discharge instructions: Diet   No. Medications  No. Follow up visit  No.  Do you have questions or concerns about your Care? No.  Actions: * If pain score is 4 or above: No action needed, pain <4.

## 2023-06-26 ENCOUNTER — Other Ambulatory Visit (HOSPITAL_BASED_OUTPATIENT_CLINIC_OR_DEPARTMENT_OTHER): Payer: Self-pay

## 2023-06-26 ENCOUNTER — Encounter (HOSPITAL_BASED_OUTPATIENT_CLINIC_OR_DEPARTMENT_OTHER): Payer: Self-pay | Admitting: Family Medicine

## 2023-06-26 ENCOUNTER — Ambulatory Visit (HOSPITAL_BASED_OUTPATIENT_CLINIC_OR_DEPARTMENT_OTHER): Payer: Commercial Managed Care - PPO | Admitting: Family Medicine

## 2023-06-26 VITALS — BP 151/91 | HR 56 | Ht 68.0 in | Wt 183.3 lb

## 2023-06-26 DIAGNOSIS — I10 Essential (primary) hypertension: Secondary | ICD-10-CM | POA: Diagnosis not present

## 2023-06-26 NOTE — Progress Notes (Signed)
    Procedures performed today:    None.  Independent interpretation of notes and tests performed by another provider:   None.  Brief History, Exam, Impression, and Recommendations:    BP (!) 151/91 Comment: REPEAT  Pulse (!) 56   Ht 5\' 8"  (1.727 m)   Wt 183 lb 4.8 oz (83.1 kg)   SpO2 100%   BMI 27.87 kg/m   Hypertension, essential Assessment & Plan: Blood pressure elevated in office, did improve slightly on recheck.  He reports that he has been checking blood pressure at home intermittently and systolic tends to be in the 140s.  He continues with medications as prescribed, currently taking losartan 100 mg daily.  He did have an episode of chest pain about 4 weeks ago at which time patient was seen in the office and ultimately went to the emergency department.  This ended up being related to esophageal spasms and he had treatment with gastroenterology.  He is doing much better currently.  Denies any current issues with chest pain, no headaches. At this time, we can continue with current medication regimen, no changes made today.  Recommend focusing on lifestyle modifications including dietary modifications and regular aerobic exercise.  We will plan for following up in about 2 months to monitor progress and determine if any further adjustments to medications are needed If needing to make further medication adjustments, likely would add second agent, likely with low-dose of amlodipine or addition of chlorthalidone or indapamide   Return in about 2 months (around 08/26/2023) for hypertension.   ___________________________________________ Keyton Bhat de [place], MD, ABFM, CAQSM [facility]

## 2023-06-26 NOTE — Assessment & Plan Note (Signed)
Blood pressure elevated in office, did improve slightly on recheck.  He reports that he has been checking blood pressure at home intermittently and systolic tends to be in the 140s.  He continues with medications as prescribed, currently taking losartan 100 mg daily.  He did have an episode of chest pain about 4 weeks ago at which time patient was seen in the office and ultimately went to the emergency department.  This ended up being related to esophageal spasms and he had treatment with gastroenterology.  He is doing much better currently.  Denies any current issues with chest pain, no headaches. At this time, we can continue with current medication regimen, no changes made today.  Recommend focusing on lifestyle modifications including dietary modifications and regular aerobic exercise.  We will plan for following up in about 2 months to monitor progress and determine if any further adjustments to medications are needed If needing to make further medication adjustments, likely would add second agent, likely with low-dose of amlodipine or addition of chlorthalidone or indapamide

## 2023-06-28 ENCOUNTER — Other Ambulatory Visit (HOSPITAL_COMMUNITY): Payer: Self-pay

## 2023-06-30 ENCOUNTER — Other Ambulatory Visit (HOSPITAL_COMMUNITY): Payer: Self-pay

## 2023-07-03 ENCOUNTER — Encounter: Payer: Self-pay | Admitting: Internal Medicine

## 2023-07-05 ENCOUNTER — Ambulatory Visit (INDEPENDENT_AMBULATORY_CARE_PROVIDER_SITE_OTHER): Payer: Commercial Managed Care - PPO | Admitting: Behavioral Health

## 2023-07-05 ENCOUNTER — Encounter: Payer: Self-pay | Admitting: Behavioral Health

## 2023-07-05 DIAGNOSIS — F4310 Post-traumatic stress disorder, unspecified: Secondary | ICD-10-CM

## 2023-07-05 DIAGNOSIS — F411 Generalized anxiety disorder: Secondary | ICD-10-CM

## 2023-07-05 DIAGNOSIS — F410 Panic disorder [episodic paroxysmal anxiety] without agoraphobia: Secondary | ICD-10-CM | POA: Diagnosis not present

## 2023-07-05 NOTE — Progress Notes (Signed)
[facility] Counselor/Therapist Progress Note  Patient ID: REEDY DOELL, MRN: [id number],    Date: 07/05/2023  Time Spent: 57 minutes, 4:02 PM to 4:59 PM.This session was held via video teletherapy. The patient consented to the video teletherapy and was located in his car during this session. He is aware it is the responsibility of the patient to secure confidentiality on his end of the session. The provider was in a private home office for the duration of this session.    Treatment Type: Individual Therapy  Reported Symptoms: Stress, anxiety  Mental Status Exam: Appearance:  Well Groomed     Behavior: Appropriate  Motor: Normal  Speech/Language:  Normal Rate  Affect: Appropriate  Mood: anxious  Thought process: normal  Thought content:   WNL  Sensory/Perceptual disturbances:   WNL  Orientation: oriented to person, place, time/date, situation, day of week, and month of year  Attention: Good  Concentration: Good  Memory: WNL  Fund of knowledge:  Good  Insight:   Good  Judgment:  Good  Impulse Control: Good   Risk Assessment: Danger to Self:  No Self-injurious Behavior: No Danger to Others: No Duty to Warn:no Physical Aggression / Violence:No  Access to Firearms a concern: No  Gang Involvement:No   Subjective: The past few weeks for the most part per patient report have been fairly calm.  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  He did acknowledge 2 episodes especially at the end of the day over the past couple of weeks in which she felt like he was thinking into a hole that he could not get out of and things are coming down on top of him.  It felt very overwhelming and it was difficult for him to breathe.  In retrospect he feels that it may have been  connected to mental overload as he pours everything into learning something in preparation for presentation as well as being is informed at work as he can be and did not take time to download.  We talked about some ways that downloaded the mental exhaustion.  He had the house to himself for a week and said that was very beneficial to him to as he spent some quiet time taking it easy.  There was an incident at work that triggered something from his past that was triggered in an olfactory way.  He processed more of some of the events from his past that was traumatic for him and a certain smell was very triggering for him and says it always has been.  We talked about ways to counter some of those thoughts which she does a good job of in terms of cognitive reframing.  He also recognizes that there are just situations and/or triggers that he avoids or gets out of if he recognizes them.  He has been using a form of mindfulness that his dentist introduced to him that expanded on mindfulness more and gave him an exercise to practice between sessions.  He is beginning to impact more of the event that is leading to trauma for him and we are doing that in a way that is safe and gradual for him while pairing it with coping skills.  Also made him aware of a therapy called EMDR which he will  look at to see if he feels that would be a good fit for him.  He is aware that the more process that the more he does not have to hold this in his head and heart saying he has done this for 40 years and his time to let it go.  There was an event several years ago which she thinks ignited the intensity which she has been dealing with the trauma response more so over the past 2 years.  We talked briefly about that situation which he felt he handled well but still was triggering for him.  He does contract for safety having no thoughts of hurting himself or anyone else. Interventions: Cognitive Behavioral Therapy and Dialectical Behavioral  Therapy  Diagnosis: Generalized anxiety disorder with panic attacks  Plan: I will meet with the patient every 2 to 3 weeks via care agility.  Treatment plan: We will use cognitive behavioral therapy as well as elements of dialectical behavior therapy and person centered therapy.  Goals will be to process the core conflicts contributing to current anxiety and panic, identify causes for anxiety and explore ways to lower it while improving his ability to manage anxiety symptoms and better handle stress.  We will also use cognitive behavioral therapy reframing to help him manage thoughts and worrisome thinking's contributing to feelings of anxiety.  Interventions will include providing education about anxiety, facilitate problem solution skills to help him identify and implement options for resolving stress, teach coping skills for managing anxiety, use cognitive behavioral therapy to identify and change anxiety provoking thoughts especially related to history and current social anxieties, as well as teach dialectical behavior therapy distress tolerance and mindfulness skills.  Goals are to reduce anxiety by 50% with a target date of October 31, 2023.  French Ana, [facility]                  French Ana, [facility]

## 2023-07-07 ENCOUNTER — Other Ambulatory Visit (HOSPITAL_BASED_OUTPATIENT_CLINIC_OR_DEPARTMENT_OTHER): Payer: Self-pay | Admitting: Family Medicine

## 2023-07-07 DIAGNOSIS — I10 Essential (primary) hypertension: Secondary | ICD-10-CM

## 2023-07-10 ENCOUNTER — Other Ambulatory Visit (HOSPITAL_BASED_OUTPATIENT_CLINIC_OR_DEPARTMENT_OTHER): Payer: Self-pay

## 2023-07-10 DIAGNOSIS — Z125 Encounter for screening for malignant neoplasm of prostate: Secondary | ICD-10-CM | POA: Diagnosis not present

## 2023-07-10 DIAGNOSIS — E291 Testicular hypofunction: Secondary | ICD-10-CM | POA: Diagnosis not present

## 2023-07-10 DIAGNOSIS — N486 Induration penis plastica: Secondary | ICD-10-CM | POA: Diagnosis not present

## 2023-07-10 DIAGNOSIS — N529 Male erectile dysfunction, unspecified: Secondary | ICD-10-CM | POA: Diagnosis not present

## 2023-07-10 MED ORDER — ANASTROZOLE 1 MG PO TABS
1.0000 mg | ORAL_TABLET | ORAL | 1 refills | Status: DC
  Filled 2023-07-10: qty 12, 84d supply, fill #0

## 2023-07-10 MED ORDER — LOSARTAN POTASSIUM 100 MG PO TABS
100.0000 mg | ORAL_TABLET | Freq: Every day | ORAL | 1 refills | Status: DC
  Filled 2023-07-10: qty 30, 30d supply, fill #0
  Filled 2023-08-08 (×2): qty 30, 30d supply, fill #1

## 2023-07-10 MED ORDER — TESTOSTERONE CYPIONATE 200 MG/ML IM SOLN
100.0000 mg | INTRAMUSCULAR | 0 refills | Status: DC
  Filled 2023-07-10 – 2023-11-20 (×2): qty 10, 70d supply, fill #0

## 2023-07-10 MED ORDER — TADALAFIL 5 MG PO TABS
5.0000 mg | ORAL_TABLET | Freq: Every day | ORAL | 3 refills | Status: DC
  Filled 2023-07-10 (×2): qty 90, 90d supply, fill #0
  Filled 2023-10-16: qty 90, 90d supply, fill #1
  Filled 2024-01-16: qty 90, 90d supply, fill #2
  Filled 2024-04-11: qty 90, 90d supply, fill #3

## 2023-07-11 ENCOUNTER — Other Ambulatory Visit: Payer: Self-pay

## 2023-07-11 ENCOUNTER — Other Ambulatory Visit (HOSPITAL_BASED_OUTPATIENT_CLINIC_OR_DEPARTMENT_OTHER): Payer: Self-pay

## 2023-07-12 ENCOUNTER — Encounter: Payer: Self-pay | Admitting: Internal Medicine

## 2023-07-13 ENCOUNTER — Other Ambulatory Visit: Payer: Self-pay

## 2023-07-13 ENCOUNTER — Other Ambulatory Visit (HOSPITAL_BASED_OUTPATIENT_CLINIC_OR_DEPARTMENT_OTHER): Payer: Self-pay

## 2023-07-19 ENCOUNTER — Other Ambulatory Visit (HOSPITAL_COMMUNITY): Payer: Self-pay

## 2023-07-19 ENCOUNTER — Encounter: Payer: Self-pay | Admitting: Internal Medicine

## 2023-07-19 ENCOUNTER — Ambulatory Visit (AMBULATORY_SURGERY_CENTER): Payer: Commercial Managed Care - PPO

## 2023-07-19 VITALS — Ht 68.0 in | Wt 184.0 lb

## 2023-07-19 DIAGNOSIS — Z1211 Encounter for screening for malignant neoplasm of colon: Secondary | ICD-10-CM

## 2023-07-19 NOTE — Progress Notes (Signed)

## 2023-07-24 ENCOUNTER — Ambulatory Visit (HOSPITAL_BASED_OUTPATIENT_CLINIC_OR_DEPARTMENT_OTHER): Payer: Commercial Managed Care - PPO | Admitting: *Deleted

## 2023-07-24 ENCOUNTER — Encounter (HOSPITAL_BASED_OUTPATIENT_CLINIC_OR_DEPARTMENT_OTHER): Payer: Self-pay | Admitting: *Deleted

## 2023-07-24 DIAGNOSIS — I10 Essential (primary) hypertension: Secondary | ICD-10-CM

## 2023-07-24 NOTE — Progress Notes (Signed)
Patient is here for a BP check. States that he does take his BP meds at night each night. BP checked x2 and both have been documented for review.

## 2023-07-28 ENCOUNTER — Other Ambulatory Visit: Payer: Self-pay

## 2023-07-28 NOTE — Progress Notes (Signed)
Specialty Pharmacy Refill Coordination Note  Bruce Woods is a 62 y.o. male contacted today regarding refills of specialty medication(s) Bictegravir-Emtricitab-Tenofov .  Patient requested Delivery  on 08/02/23  to verified address Patient address [address]  [place] [address]   Medication will be filled on 08/01/23.

## 2023-08-01 ENCOUNTER — Ambulatory Visit: Payer: Commercial Managed Care - PPO | Admitting: Behavioral Health

## 2023-08-01 ENCOUNTER — Other Ambulatory Visit: Payer: Self-pay

## 2023-08-02 ENCOUNTER — Other Ambulatory Visit (HOSPITAL_BASED_OUTPATIENT_CLINIC_OR_DEPARTMENT_OTHER): Payer: Self-pay

## 2023-08-02 ENCOUNTER — Encounter: Payer: Self-pay | Admitting: Internal Medicine

## 2023-08-02 ENCOUNTER — Ambulatory Visit (AMBULATORY_SURGERY_CENTER): Payer: Commercial Managed Care - PPO | Admitting: Internal Medicine

## 2023-08-02 VITALS — BP 116/74 | HR 47 | Temp 98.9°F | Resp 10 | Ht 64.0 in | Wt 184.0 lb

## 2023-08-02 DIAGNOSIS — F32A Depression, unspecified: Secondary | ICD-10-CM | POA: Diagnosis not present

## 2023-08-02 DIAGNOSIS — Z1211 Encounter for screening for malignant neoplasm of colon: Secondary | ICD-10-CM

## 2023-08-02 DIAGNOSIS — I10 Essential (primary) hypertension: Secondary | ICD-10-CM | POA: Diagnosis not present

## 2023-08-02 DIAGNOSIS — G4730 Sleep apnea, unspecified: Secondary | ICD-10-CM | POA: Diagnosis not present

## 2023-08-02 DIAGNOSIS — F419 Anxiety disorder, unspecified: Secondary | ICD-10-CM | POA: Diagnosis not present

## 2023-08-02 MED ORDER — SODIUM CHLORIDE 0.9 % IV SOLN: 500.0000 mL | INTRAVENOUS | Status: DC

## 2023-08-02 MED ORDER — COMIRNATY 30 MCG/0.3ML IM SUSY
PREFILLED_SYRINGE | INTRAMUSCULAR | 0 refills | Status: DC
  Filled 2023-08-02: qty 0.3, 1d supply, fill #0

## 2023-08-02 NOTE — Op Note (Signed)
[facility] Patient Name: Bruce Woods Procedure Date: 08/02/2023 9:34 AM MRN: [id number] Endoscopist: Iva Boop , MD, [id number] Age: 62 Referring MD:  Date of Birth: [birth year] Gender: Male Account #: [id number] Procedure:                Colonoscopy Indications:              Screening for colorectal malignant neoplasm, Last                            colonoscopy: 2008 Medicines:                Monitored Anesthesia Care Procedure:                Pre-Anesthesia Assessment:                           - Prior to the procedure, a History and Physical                            was performed, and patient medications and                            allergies were reviewed. The patient's tolerance of                            previous anesthesia was also reviewed. The risks                            and benefits of the procedure and the sedation                            options and risks were discussed with the patient.                            All questions were answered, and informed consent                            was obtained. Prior Anticoagulants: The patient has                            taken no anticoagulant or antiplatelet agents. ASA                            Grade Assessment: II - A patient with mild systemic                            disease. After reviewing the risks and benefits,                            the patient was deemed in satisfactory condition to                            undergo the procedure.  After obtaining informed consent, the colonoscope                            was passed under direct vision. Throughout the                            procedure, the patient's blood pressure, pulse, and                            oxygen saturations were monitored continuously. The                            CF [id number] #[id number] was introduced through the anus                            and advanced to the the cecum,  identified by                            appendiceal orifice and ileocecal valve. The                            colonoscopy was performed without difficulty. The                            patient tolerated the procedure well. The quality                            of the bowel preparation was good. The ileocecal                            valve, appendiceal orifice, and rectum were                            photographed. The bowel preparation used was                            Miralax via split dose instruction. Scope In: 9:47:40 AM Scope Out: 10:00:52 AM Scope Withdrawal Time: 0 hours 9 minutes 49 seconds  Total Procedure Duration: 0 hours 13 minutes 12 seconds  Findings:                 The perianal and digital rectal examinations were                            normal.                           Multiple diverticula were found in the sigmoid                            colon.                           The exam was otherwise without abnormality on  direct and retroflexion views. Complications:            No immediate complications. Estimated Blood Loss:     Estimated blood loss: none. Impression:               - Diverticulosis in the sigmoid colon.                           - The examination was otherwise normal on direct                            and retroflexion views.                           - No specimens collected. Recommendation:           - Patient has a contact number available for                            emergencies. The signs and symptoms of potential                            delayed complications were discussed with the                            patient. Return to normal activities tomorrow.                            Written discharge instructions were provided to the                            patient.                           - Resume previous diet.                           - Continue present medications.                            - Repeat colonoscopy in 10 years for screening                            purposes. Iva Boop, MD 08/02/2023 10:08:11 AM This report has been signed electronically.

## 2023-08-02 NOTE — Progress Notes (Signed)
Sedate, gd SR, tolerated procedure well, VSS, report to RN 

## 2023-08-02 NOTE — Progress Notes (Signed)
Pt's states no medical or surgical changes since previsit or office visit. 

## 2023-08-02 NOTE — Progress Notes (Signed)
[facility] History and Physical   Primary Care Physician:  de [place], Buren Kos, MD   Reason for Procedure:    Encounter Diagnosis  Name Primary?   Special screening for malignant neoplasms, colon Yes     Plan:    colonoscopy     HPI: Bruce Woods is a 62 y.o. male presenting for screening exam  Past Medical History:  Diagnosis Date   Allergy    Angio-edema suspected of small bowel question due to amlodipine 06/02/2022   Anxiety    Complication of anesthesia 07/28/2022   oxygen desaturations to 60's during EGD, bronchospasm after scope removed, reported being told he has suspected undiagnosed OSA   COVID-19 07/2021   DEPRESSION    Gastritis and gastroduodenitis    GERD (gastroesophageal reflux disease)    History of hiatal hernia    HIV DISEASE    Hypertension    IBS (irritable bowel syndrome)    hx of   Sarcoidosis 2000   question of   Sleep apnea    TIA (transient ischemic attack) 12/2022   occipital TIA-NO residual effects   Ulnar nerve compression, right     Past Surgical History:  Procedure Laterality Date   COLONOSCOPY  2010   [place], [place]-unknown prep(exc)-normal   ESOPHAGOGASTRODUODENOSCOPY  2002   HERNIA REPAIR     LUNG SURGERY  10/31/1998   vats   TONSILLECTOMY AND ADENOIDECTOMY     ULNAR NERVE TRANSPOSITION Right 05/17/2018   Procedure: RIGHT ULNAR NERVE DECOMPRESSION/TRANSPOSITION;  Surgeon: Betha Loa, MD;  Location: [facility];  Service: Orthopedics;  Laterality: Right;   XI ROBOTIC ASSISTED HIATAL HERNIA REPAIR N/A 09/13/2022   Procedure: XI ROBOTIC ASSISTED HIATAL HERNIA REPAIR WITH FUNDOPLICATION WITH MESH;  Surgeon: Axel Filler, MD;  Location: [facility] OR;  Service: General;  Laterality: N/A;    Prior to Admission medications   Medication Sig Start Date End Date Taking? Authorizing Provider  anastrozole (ARIMIDEX) 1 MG tablet Take 1 tablet (1 mg total) by mouth once a week. 04/09/23  Yes   aspirin EC 81  MG tablet Take 1 tablet (81 mg total) by mouth daily. Swallow whole. 04/14/23  Yes de [place], Raymond J, MD  bictegravir-emtricitabine-tenofovir AF (BIKTARVY) 50-200-25 MG TABS tablet Take 1 tablet by mouth daily. 10/05/22  Yes Cliffton Asters, MD  citalopram (CELEXA) 20 MG tablet Take 1 tablet (20 mg total) by mouth daily. 02/05/23  Yes Worthy Rancher B, FNP  rosuvastatin (CRESTOR) 20 MG tablet Take 1 tablet (20 mg total) by mouth daily. 04/14/23  Yes de [place], Raymond J, MD  testosterone cypionate (DEPOTESTOSTERONE CYPIONATE) 200 MG/ML injection Inject 0.5 mLs (100 mg total) into the muscle once a week. 07/10/23  Yes   losartan (COZAAR) 100 MG tablet Take 1 tablet (100 mg total) by mouth daily. 07/10/23   de [place], Raymond J, MD  tadalafil (CIALIS) 5 MG tablet Take 1 tablet (5 mg total) by mouth daily. 07/10/23     zolpidem (AMBIEN) 5 MG tablet Take 1 tablet (5 mg total) by mouth at bedtime as needed for sleep. 01/16/23   de [place], Raymond J, MD    Current Outpatient Medications  Medication Sig Dispense Refill   anastrozole (ARIMIDEX) 1 MG tablet Take 1 tablet (1 mg total) by mouth once a week. 20 tablet 1   aspirin EC 81 MG tablet Take 1 tablet (81 mg total) by mouth daily. Swallow whole. 90 tablet 1   bictegravir-emtricitabine-tenofovir AF (BIKTARVY) 50-200-25 MG TABS tablet  Take 1 tablet by mouth daily. 30 tablet 11   citalopram (CELEXA) 20 MG tablet Take 1 tablet (20 mg total) by mouth daily. 90 tablet 3   rosuvastatin (CRESTOR) 20 MG tablet Take 1 tablet (20 mg total) by mouth daily. 90 tablet 1   testosterone cypionate (DEPOTESTOSTERONE CYPIONATE) 200 MG/ML injection Inject 0.5 mLs (100 mg total) into the muscle once a week. 10 mL 0   losartan (COZAAR) 100 MG tablet Take 1 tablet (100 mg total) by mouth daily. 30 tablet 1   tadalafil (CIALIS) 5 MG tablet Take 1 tablet (5 mg total) by mouth daily. 90 tablet 3   zolpidem (AMBIEN) 5 MG tablet Take 1 tablet (5 mg total) by mouth at bedtime as needed for sleep.  30 tablet 1   Current Facility-Administered Medications  Medication Dose Route Frequency Provider Last Rate Last Admin   0.9 %  sodium chloride infusion  500 mL Intravenous Continuous Iva Boop, MD        Allergies as of 08/02/2023 - Review Complete 08/02/2023  Allergen Reaction Noted   Amlodipine besylate Swelling 06/02/2022   Cepacol [benzocaine-menthol] Anaphylaxis 09/13/2022   Morphine Anaphylaxis and Other (See Comments) 08/18/2006   Vibramycin [doxycycline] Nausea And Vomiting 08/18/2006   Celebrex [celecoxib] Other (See Comments) 04/19/2011   Wellbutrin [bupropion] Hives 11/12/2013    Family History  Problem Relation Age of Onset   Hyperlipidemia Mother    Hypertension Mother    Cancer Mother        glioblastoma   Sudden death Neg Hx    Heart attack Neg Hx    Diabetes Neg Hx    Colon cancer Neg Hx    Prostate cancer Neg Hx    Colon polyps Neg Hx    Esophageal cancer Neg Hx    Rectal cancer Neg Hx    Stomach cancer Neg Hx     Social History   Socioeconomic History   Marital status: Married    Spouse name: Not on file   Number of children: 2   Years of education: Not on file   Highest education level: Bachelor's degree (e.g., BA, AB, BS)  Occupational History   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: [employer]  Tobacco Use   Smoking status: Former    Current packs/day: 0.00    Average packs/day: 1 pack/day for 2.0 years (2.0 ttl pk-yrs)    Types: Cigarettes    Start date: 10/31/1981    Quit date: 11/01/1983    Years since quitting: 39.7   Smokeless tobacco: Never  Vaping Use   Vaping status: Never Used  Substance and Sexual Activity   Alcohol use: Yes    Alcohol/week: 1.0 standard drink of alcohol    Types: 1 Standard drinks or equivalent per week    Comment: Social   Drug use: No   Sexual activity: Not Currently    Birth control/protection: Abstinence    Comment: declined condoms  Other Topics Concern   Not on file  Social  History Narrative   Married to [place], daughters Jearld Pies   RN PACU, ED   Former smoker no drug use occasional alcohol   Social Determinants of Health   Financial Resource Strain: Low Risk  (05/11/2023)   Overall Financial Resource Strain (CARDIA)    Difficulty of Paying Living Expenses: Not hard at all  Food Insecurity: No Food Insecurity (05/11/2023)   Hunger Vital Sign    Worried About  Running Out of Food in the Last Year: Never true    Ran Out of Food in the Last Year: Never true  Transportation Needs: No Transportation Needs (05/11/2023)   PRAPARE - Administrator, Civil Service (Medical): No    Lack of Transportation (Non-Medical): No  Physical Activity: Insufficiently Active (05/11/2023)   Exercise Vital Sign    Days of Exercise per Week: 3 days    Minutes of Exercise per Session: 30 min  Stress: Stress Concern Present (05/11/2023)   [employer] of Occupational Health - Occupational Stress Questionnaire    Feeling of Stress : Rather much  Social Connections: Socially Isolated (05/11/2023)   Social Connection and Isolation Panel [NHANES]    Frequency of Communication with Friends and Family: Once a week    Frequency of Social Gatherings with Friends and Family: Never    Attends Religious Services: Never    Database administrator or Organizations: No    Attends Banker Meetings: Never    Marital Status: Married  Catering manager Violence: Not At Risk (01/03/2023)   Humiliation, Afraid, Rape, and Kick questionnaire    Fear of Current or Ex-Partner: No    Emotionally Abused: No    Physically Abused: No    Sexually Abused: No    Review of Systems:  All other review of systems negative except as mentioned in the HPI.  Physical Exam: Vital signs BP 136/89   Pulse 68   Temp 98.9 F (37.2 C)   Ht 5\' 4"  (1.626 m)   Wt 184 lb (83.5 kg)   SpO2 97%   BMI 31.58 kg/m   General:   Alert,  Well-developed, well-nourished, pleasant and  cooperative in NAD Lungs:  Clear throughout to auscultation.   Heart:  Regular rate and rhythm; no murmurs, clicks, rubs,  or gallops. Abdomen:  Soft, nontender and nondistended. Normal bowel sounds.   Neuro/Psych:  Alert and cooperative. Normal mood and affect. A and O x 3   @Wilmarie Sparlin  Sena Slate, MD, [facility] [phone] (pager) 08/02/2023 9:38 AM@

## 2023-08-02 NOTE — Patient Instructions (Addendum)
Some diverticulosis seen but no polyps or colon cancer.  Next routine colonoscopy or other screening test in 10 years - 2034.  I appreciate the opportunity to care for you. Iva Boop, MD, [facility]  Handout provided on diverticulosis.  Resume previous diet.  Continue present medications.  Repeat colonoscopy in 10 years for screening purposes.  YOU HAD AN ENDOSCOPIC PROCEDURE TODAY AT THE [facility]:   Refer to the procedure report that was given to you for any specific questions about what was found during the examination.  If the procedure report does not answer your questions, please call your gastroenterologist to clarify.  If you requested that your care partner not be given the details of your procedure findings, then the procedure report has been included in a sealed envelope for you to review at your convenience later.  YOU SHOULD EXPECT: Some feelings of bloating in the abdomen. Passage of more gas than usual.  Walking can help get rid of the air that was put into your GI tract during the procedure and reduce the bloating. If you had a lower endoscopy (such as a colonoscopy or flexible sigmoidoscopy) you may notice spotting of blood in your stool or on the toilet paper. If you underwent a bowel prep for your procedure, you may not have a normal bowel movement for a few days.  Please Note:  You might notice some irritation and congestion in your nose or some drainage.  This is from the oxygen used during your procedure.  There is no need for concern and it should clear up in a day or so.  SYMPTOMS TO REPORT IMMEDIATELY:  Following lower endoscopy (colonoscopy or flexible sigmoidoscopy):  Excessive amounts of blood in the stool  Significant tenderness or worsening of abdominal pains  Swelling of the abdomen that is new, acute  Fever of 100F or higher  For urgent or emergent issues, a gastroenterologist can be reached at any hour by calling [phone]. Do not use  MyChart messaging for urgent concerns.    DIET:  We do recommend a small meal at first, but then you may proceed to your regular diet.  Drink plenty of fluids but you should avoid alcoholic beverages for 24 hours.  ACTIVITY:  You should plan to take it easy for the rest of today and you should NOT DRIVE or use heavy machinery until tomorrow (because of the sedation medicines used during the test).    FOLLOW UP: Our staff will call the number listed on your records the next business day following your procedure.  We will call around 7:15- 8:00 am to check on you and address any questions or concerns that you may have regarding the information given to you following your procedure. If we do not reach you, we will leave a message.     If any biopsies were taken you will be contacted by phone or by letter within the next 1-3 weeks.  Please call us at [phone] if you have not heard about the biopsies in 3 weeks.    SIGNATURES/CONFIDENTIALITY: You and/or your care partner have signed paperwork which will be entered into your electronic medical record.  These signatures attest to the fact that that the information above on your After Visit Summary has been reviewed and is understood.  Full responsibility of the confidentiality of this discharge information lies with you and/or your care-partner.

## 2023-08-03 ENCOUNTER — Telehealth: Payer: Self-pay | Admitting: *Deleted

## 2023-08-03 NOTE — Telephone Encounter (Signed)
  Follow up Call-     08/02/2023    8:40 AM 06/20/2023    9:00 AM 07/28/2022    7:31 AM 10/11/2021    7:38 AM  Call back number  Post procedure Call Back phone  # [phone]  Permission to leave phone message Yes Yes Yes Yes     Patient questions:   Message left to call us if necessary.

## 2023-08-05 ENCOUNTER — Other Ambulatory Visit (HOSPITAL_BASED_OUTPATIENT_CLINIC_OR_DEPARTMENT_OTHER): Payer: Self-pay

## 2023-08-08 ENCOUNTER — Other Ambulatory Visit: Payer: Self-pay

## 2023-08-08 ENCOUNTER — Other Ambulatory Visit (HOSPITAL_BASED_OUTPATIENT_CLINIC_OR_DEPARTMENT_OTHER): Payer: Self-pay

## 2023-08-14 ENCOUNTER — Encounter (HOSPITAL_BASED_OUTPATIENT_CLINIC_OR_DEPARTMENT_OTHER): Payer: Self-pay | Admitting: Family Medicine

## 2023-08-18 ENCOUNTER — Other Ambulatory Visit: Payer: Self-pay

## 2023-08-18 NOTE — Progress Notes (Signed)
Specialty Pharmacy Refill Coordination Note  Bruce Woods is a 62 y.o. male contacted today regarding refills of specialty medication(s) Bictegravir-Emtricitab-Tenofov   Patient requested Delivery   Delivery date: 08/29/23   Verified address: [address]  [place] [address]   Medication will be filled on 08/28/23.

## 2023-08-21 ENCOUNTER — Other Ambulatory Visit: Payer: Self-pay

## 2023-08-24 ENCOUNTER — Other Ambulatory Visit (HOSPITAL_BASED_OUTPATIENT_CLINIC_OR_DEPARTMENT_OTHER): Payer: Self-pay | Admitting: Family Medicine

## 2023-08-24 ENCOUNTER — Other Ambulatory Visit (HOSPITAL_BASED_OUTPATIENT_CLINIC_OR_DEPARTMENT_OTHER): Payer: Self-pay

## 2023-08-25 ENCOUNTER — Ambulatory Visit (HOSPITAL_BASED_OUTPATIENT_CLINIC_OR_DEPARTMENT_OTHER): Payer: Commercial Managed Care - PPO | Admitting: Family Medicine

## 2023-08-25 ENCOUNTER — Other Ambulatory Visit (HOSPITAL_BASED_OUTPATIENT_CLINIC_OR_DEPARTMENT_OTHER): Payer: Self-pay

## 2023-08-25 MED ORDER — ZOLPIDEM TARTRATE 5 MG PO TABS
5.0000 mg | ORAL_TABLET | Freq: Every evening | ORAL | 1 refills | Status: DC | PRN
  Filled 2023-08-25: qty 30, 30d supply, fill #0
  Filled 2023-12-18: qty 30, 30d supply, fill #1

## 2023-08-31 ENCOUNTER — Ambulatory Visit: Payer: Commercial Managed Care - PPO | Admitting: Behavioral Health

## 2023-08-31 ENCOUNTER — Encounter: Payer: Self-pay | Admitting: Behavioral Health

## 2023-08-31 DIAGNOSIS — F410 Panic disorder [episodic paroxysmal anxiety] without agoraphobia: Secondary | ICD-10-CM | POA: Diagnosis not present

## 2023-08-31 DIAGNOSIS — F411 Generalized anxiety disorder: Secondary | ICD-10-CM

## 2023-08-31 DIAGNOSIS — F4310 Post-traumatic stress disorder, unspecified: Secondary | ICD-10-CM

## 2023-08-31 NOTE — Progress Notes (Signed)
[facility] Counselor/Therapist Progress Note  Patient ID: Bruce Woods, MRN: [id number],    Date: 08/31/2023  Time Spent: 57 minutes, 8 AM until 8:57 AM.This session was held via video teletherapy. The patient consented to the video teletherapy and was located in his car during this session. He is aware it is the responsibility of the patient to secure confidentiality on his end of the session. The provider was in a private home office for the duration of this session.    Treatment Type: Individual Therapy  Reported Symptoms: Stress, anxiety  Mental Status Exam: Appearance:  Well Groomed     Behavior: Appropriate  Motor: Normal  Speech/Language:  Normal Rate  Affect: Appropriate  Mood: anxious  Thought process: normal  Thought content:   WNL  Sensory/Perceptual disturbances:   WNL  Orientation: oriented to person, place, time/date, situation, day of week, and month of year  Attention: Good  Concentration: Good  Memory: WNL  Fund of knowledge:  Good  Insight:   Good  Judgment:  Good  Impulse Control: Good   Risk Assessment: Danger to Self:  No Self-injurious Behavior: No Danger to Others: No Duty to Warn:no Physical Aggression / Violence:No  Access to Firearms a concern: No  Gang Involvement:No   Subjective: The patient is looking into EMDR therapy and felt that he does relate to some of the things recommended by its founder.  He was at the beach recently and was able to take walking and doing so processed a lot of his past trauma to the point of he said becoming physically ill and exhausted.  That was a breakthrough for him.  There was recognition that he did not have any ownership or responsibility and what took place it was traumatic for him.  In coming to the realization he also remembered more details and was able to connect why certain things especially smells are such a huge trigger for him.  We processed some more of his past and how it is  contributing to his anxiety today.  We looked more closely at his relationship with his wife.  He feels that he has processed most of his past well and is appreciative of that by moving forward wants to find ways to reduce his anxiety.  He said he still becomes overwhelmed a lot in public and in groups.  There are a few small safe places that he is familiar with that he can manage as long as his back is to the door.  There are other places such as meetings at work in which she can be triggered.  He said he was in a meeting with 4 or 5 others that he has known entrusted a long time.  He said he got the first but is back to the door but then at the end of the meeting when one of the other professionals left he felt triggered and started sweating and heart racing feeling overwhelmed.  He said at times people notice but he has found ways to discount it or make it look as if he is not feeling well.  Moving forward we will look at some gradual exposure therapy understanding what is behind the physical response as well as the cognitive triggers. He would like to meet in person next time so we are scheduled for December 17. He does contract for safety having no thoughts of hurting himself or anyone else. Interventions: Cognitive Behavioral Therapy and Dialectical Behavioral Therapy  Diagnosis: Generalized anxiety disorder with panic  attacks  Plan: I will meet with the patient every 2 to 3 weeks via care agility.  Treatment plan: We will use cognitive behavioral therapy as well as elements of dialectical behavior therapy and person centered therapy.  Goals will be to process the core conflicts contributing to current anxiety and panic, identify causes for anxiety and explore ways to lower it while improving his ability to manage anxiety symptoms and better handle stress.  We will also use cognitive behavioral therapy reframing to help him manage thoughts and worrisome thinking's contributing to feelings of anxiety.   Interventions will include providing education about anxiety, facilitate problem solution skills to help him identify and implement options for resolving stress, teach coping skills for managing anxiety, use cognitive behavioral therapy to identify and change anxiety provoking thoughts especially related to history and current social anxieties, as well as teach dialectical behavior therapy distress tolerance and mindfulness skills.  Goals are to reduce anxiety by 50% with a target date of October 31, 2023.  French Ana, [facility]                  French Ana, [facility]               French Ana, [facility]

## 2023-09-03 ENCOUNTER — Other Ambulatory Visit (HOSPITAL_BASED_OUTPATIENT_CLINIC_OR_DEPARTMENT_OTHER): Payer: Self-pay | Admitting: Family Medicine

## 2023-09-03 DIAGNOSIS — I10 Essential (primary) hypertension: Secondary | ICD-10-CM

## 2023-09-04 ENCOUNTER — Other Ambulatory Visit (HOSPITAL_BASED_OUTPATIENT_CLINIC_OR_DEPARTMENT_OTHER): Payer: Self-pay

## 2023-09-04 MED ORDER — LOSARTAN POTASSIUM 100 MG PO TABS
100.0000 mg | ORAL_TABLET | Freq: Every day | ORAL | 1 refills | Status: DC
  Filled 2023-09-04: qty 30, 30d supply, fill #0
  Filled 2023-10-03: qty 30, 30d supply, fill #1

## 2023-09-06 ENCOUNTER — Other Ambulatory Visit: Payer: Self-pay

## 2023-09-13 ENCOUNTER — Other Ambulatory Visit: Payer: Self-pay

## 2023-09-18 ENCOUNTER — Other Ambulatory Visit: Payer: Self-pay

## 2023-09-18 ENCOUNTER — Other Ambulatory Visit (HOSPITAL_COMMUNITY): Payer: Self-pay

## 2023-09-18 ENCOUNTER — Other Ambulatory Visit: Payer: Self-pay | Admitting: Infectious Disease

## 2023-09-18 ENCOUNTER — Other Ambulatory Visit: Payer: Self-pay | Admitting: Internal Medicine

## 2023-09-18 DIAGNOSIS — Z113 Encounter for screening for infections with a predominantly sexual mode of transmission: Secondary | ICD-10-CM

## 2023-09-18 DIAGNOSIS — B20 Human immunodeficiency virus [HIV] disease: Secondary | ICD-10-CM

## 2023-09-18 MED ORDER — BIKTARVY 50-200-25 MG PO TABS
1.0000 | ORAL_TABLET | Freq: Every day | ORAL | 1 refills | Status: DC
  Filled 2023-09-18: qty 30, 30d supply, fill #0

## 2023-09-18 NOTE — Progress Notes (Signed)
Specialty Pharmacy Refill Coordination Note  Bruce Woods is a 62 y.o. male contacted today regarding refills of specialty medication(s) Bictegravir-Emtricitab-Tenofov   Patient requested Delivery   Delivery date: 09/21/23   Verified address: [address] [place] [address]   Medication will be filled on 09/20/23, pending refill request.

## 2023-09-19 ENCOUNTER — Other Ambulatory Visit: Payer: Commercial Managed Care - PPO

## 2023-09-19 ENCOUNTER — Other Ambulatory Visit: Payer: Self-pay

## 2023-09-19 DIAGNOSIS — Z113 Encounter for screening for infections with a predominantly sexual mode of transmission: Secondary | ICD-10-CM

## 2023-09-19 DIAGNOSIS — B20 Human immunodeficiency virus [HIV] disease: Secondary | ICD-10-CM | POA: Diagnosis not present

## 2023-09-20 ENCOUNTER — Other Ambulatory Visit (HOSPITAL_COMMUNITY): Payer: Self-pay

## 2023-09-20 ENCOUNTER — Other Ambulatory Visit (HOSPITAL_BASED_OUTPATIENT_CLINIC_OR_DEPARTMENT_OTHER): Payer: Self-pay

## 2023-09-20 LAB — T-HELPER CELL (CD4) - (RCID CLINIC ONLY)
CD4 % Helper T Cell: 21 % — ABNORMAL LOW (ref 33–65)
CD4 T Cell Abs: 392 /uL — ABNORMAL LOW (ref 400–1790)

## 2023-09-21 LAB — CBC WITH DIFFERENTIAL/PLATELET
Absolute Lymphocytes: 2010 {cells}/uL (ref 850–3900)
Absolute Monocytes: 342 {cells}/uL (ref 200–950)
Basophils Absolute: 22 {cells}/uL (ref 0–200)
Basophils Relative: 0.4 %
Eosinophils Absolute: 28 {cells}/uL (ref 15–500)
Eosinophils Relative: 0.5 %
HCT: 49.2 % (ref 38.5–50.0)
Hemoglobin: 16.9 g/dL (ref 13.2–17.1)
MCH: 32.7 pg (ref 27.0–33.0)
MCHC: 34.3 g/dL (ref 32.0–36.0)
MCV: 95.2 fL (ref 80.0–100.0)
MPV: 10.6 fL (ref 7.5–12.5)
Monocytes Relative: 6.1 %
Neutro Abs: 3198 {cells}/uL (ref 1500–7800)
Neutrophils Relative %: 57.1 %
Platelets: 167 10*3/uL (ref 140–400)
RBC: 5.17 10*6/uL (ref 4.20–5.80)
RDW: 13.8 % (ref 11.0–15.0)
Total Lymphocyte: 35.9 %
WBC: 5.6 10*3/uL (ref 3.8–10.8)

## 2023-09-21 LAB — COMPLETE METABOLIC PANEL WITH GFR
AG Ratio: 1.8 (calc) (ref 1.0–2.5)
ALT: 13 U/L (ref 9–46)
AST: 15 U/L (ref 10–35)
Albumin: 4.5 g/dL (ref 3.6–5.1)
Alkaline phosphatase (APISO): 63 U/L (ref 35–144)
BUN: 13 mg/dL (ref 7–25)
CO2: 32 mmol/L (ref 20–32)
Calcium: 9.5 mg/dL (ref 8.6–10.3)
Chloride: 104 mmol/L (ref 98–110)
Creat: 1.35 mg/dL (ref 0.70–1.35)
Globulin: 2.5 g/dL (ref 1.9–3.7)
Glucose, Bld: 92 mg/dL (ref 65–99)
Potassium: 4.2 mmol/L (ref 3.5–5.3)
Sodium: 143 mmol/L (ref 135–146)
Total Bilirubin: 1 mg/dL (ref 0.2–1.2)
Total Protein: 7 g/dL (ref 6.1–8.1)
eGFR: 59 mL/min/{1.73_m2} — ABNORMAL LOW (ref >=60)

## 2023-09-21 LAB — HIV-1 RNA QUANT-NO REFLEX-BLD
HIV 1 RNA Quant: NOT DETECTED {copies}/mL
HIV-1 RNA Quant, Log: NOT DETECTED {Log_copies}/mL

## 2023-09-21 LAB — LIPID PANEL
Cholesterol: 103 mg/dL (ref <200)
HDL: 34 mg/dL — ABNORMAL LOW (ref >=40)
LDL Cholesterol (Calc): 53 mg/dL
Non-HDL Cholesterol (Calc): 69 mg/dL (ref <130)
Total CHOL/HDL Ratio: 3 (calc) (ref <5.0)
Triglycerides: 82 mg/dL (ref <150)

## 2023-09-21 LAB — RPR: RPR Ser Ql: NONREACTIVE

## 2023-10-01 ENCOUNTER — Encounter: Payer: Self-pay | Admitting: Infectious Disease

## 2023-10-01 DIAGNOSIS — E785 Hyperlipidemia, unspecified: Secondary | ICD-10-CM

## 2023-10-01 HISTORY — DX: Hyperlipidemia, unspecified: E78.5

## 2023-10-02 ENCOUNTER — Ambulatory Visit: Payer: Self-pay | Admitting: Infectious Disease

## 2023-10-02 ENCOUNTER — Other Ambulatory Visit: Payer: Self-pay

## 2023-10-02 ENCOUNTER — Encounter: Payer: Self-pay | Admitting: Infectious Disease

## 2023-10-02 VITALS — BP 170/98 | HR 65 | Temp 98.4°F | Wt 186.0 lb

## 2023-10-02 DIAGNOSIS — F411 Generalized anxiety disorder: Secondary | ICD-10-CM

## 2023-10-02 DIAGNOSIS — D869 Sarcoidosis, unspecified: Secondary | ICD-10-CM

## 2023-10-02 DIAGNOSIS — I10 Essential (primary) hypertension: Secondary | ICD-10-CM

## 2023-10-02 DIAGNOSIS — D751 Secondary polycythemia: Secondary | ICD-10-CM

## 2023-10-02 DIAGNOSIS — B20 Human immunodeficiency virus [HIV] disease: Secondary | ICD-10-CM | POA: Diagnosis not present

## 2023-10-02 DIAGNOSIS — R7989 Other specified abnormal findings of blood chemistry: Secondary | ICD-10-CM

## 2023-10-02 DIAGNOSIS — E785 Hyperlipidemia, unspecified: Secondary | ICD-10-CM

## 2023-10-02 DIAGNOSIS — Z7185 Encounter for immunization safety counseling: Secondary | ICD-10-CM

## 2023-10-02 DIAGNOSIS — N529 Male erectile dysfunction, unspecified: Secondary | ICD-10-CM

## 2023-10-02 DIAGNOSIS — F4310 Post-traumatic stress disorder, unspecified: Secondary | ICD-10-CM

## 2023-10-02 MED ORDER — BIKTARVY 50-200-25 MG PO TABS
1.0000 | ORAL_TABLET | Freq: Every day | ORAL | 11 refills | Status: DC
  Filled 2023-10-02 – 2023-10-09 (×2): qty 30, 30d supply, fill #0
  Filled 2023-11-07: qty 30, 30d supply, fill #1
  Filled 2023-12-04: qty 30, 30d supply, fill #2
  Filled 2024-01-02: qty 30, 30d supply, fill #3
  Filled 2024-02-14 (×2): qty 30, 30d supply, fill #4
  Filled 2024-03-27: qty 30, 30d supply, fill #5
  Filled 2024-05-02: qty 30, 30d supply, fill #6
  Filled 2024-05-21: qty 30, 30d supply, fill #7
  Filled 2024-06-13: qty 30, 30d supply, fill #8
  Filled 2024-07-29: qty 30, 30d supply, fill #9
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
## 2023-10-03 ENCOUNTER — Other Ambulatory Visit (HOSPITAL_COMMUNITY): Payer: Self-pay

## 2023-10-03 ENCOUNTER — Other Ambulatory Visit (HOSPITAL_BASED_OUTPATIENT_CLINIC_OR_DEPARTMENT_OTHER): Payer: Self-pay

## 2023-10-07 ENCOUNTER — Other Ambulatory Visit (HOSPITAL_BASED_OUTPATIENT_CLINIC_OR_DEPARTMENT_OTHER): Payer: Self-pay | Admitting: Family Medicine

## 2023-10-09 ENCOUNTER — Other Ambulatory Visit (HOSPITAL_COMMUNITY): Payer: Self-pay

## 2023-10-09 ENCOUNTER — Other Ambulatory Visit (HOSPITAL_BASED_OUTPATIENT_CLINIC_OR_DEPARTMENT_OTHER): Payer: Self-pay

## 2023-10-09 MED ORDER — ROSUVASTATIN CALCIUM 20 MG PO TABS
20.0000 mg | ORAL_TABLET | Freq: Every day | ORAL | 1 refills | Status: DC
  Filled 2023-10-09: qty 90, 90d supply, fill #0
  Filled 2024-01-07: qty 90, 90d supply, fill #1

## 2023-10-09 NOTE — Progress Notes (Signed)
Specialty Pharmacy Refill Coordination Note  Bruce Woods is a 62 y.o. male contacted today regarding refills of specialty medication(s) Bictegravir-Emtricitab-Tenofov   Patient requested Delivery   Delivery date: 10/17/23   Verified address: [address] [place] [address]   Medication will be filled on 10/16/23.

## 2023-10-09 NOTE — Progress Notes (Signed)
Specialty Pharmacy Ongoing Clinical Assessment Note  Bruce Woods is a 62 y.o. male who is being followed by the specialty pharmacy service for RxSp HIV   Patient's specialty medication(s) reviewed today: Bictegravir-Emtricitab-Tenofov   Missed doses in the last 4 weeks: 0   Patient/Caregiver did not have any additional questions or concerns.   Therapeutic benefit summary: Patient is achieving benefit   Adverse events/side effects summary: No adverse events/side effects   Patient's therapy is appropriate to: Continue    Goals Addressed             This Visit's Progress    Achieve Undetectable HIV Viral Load < 20       Patient is on track. Patient will maintain adherence.  Patient's viral load remains undetectable.          Follow up:  6 months  [name] Specialty Pharmacist

## 2023-10-16 ENCOUNTER — Other Ambulatory Visit (HOSPITAL_BASED_OUTPATIENT_CLINIC_OR_DEPARTMENT_OTHER): Payer: Self-pay

## 2023-10-16 ENCOUNTER — Other Ambulatory Visit: Payer: Self-pay

## 2023-10-17 ENCOUNTER — Encounter: Payer: Self-pay | Admitting: Behavioral Health

## 2023-10-17 ENCOUNTER — Ambulatory Visit: Payer: Commercial Managed Care - PPO | Admitting: Behavioral Health

## 2023-10-17 DIAGNOSIS — F410 Panic disorder [episodic paroxysmal anxiety] without agoraphobia: Secondary | ICD-10-CM | POA: Diagnosis not present

## 2023-10-17 DIAGNOSIS — F4310 Post-traumatic stress disorder, unspecified: Secondary | ICD-10-CM

## 2023-10-17 DIAGNOSIS — F411 Generalized anxiety disorder: Secondary | ICD-10-CM

## 2023-10-17 NOTE — Progress Notes (Signed)
[facility] Counselor/Therapist Progress Note  Patient ID: Bruce Woods, MRN: [id number],    Date: 10/17/2023  Time Spent: 10 AM until 10:58 AM, 58 minutes spent in person with the patient in the outpatient therapist office..T  Treatment Type: Individual Therapy  Reported Symptoms: Stress, anxiety  Mental Status Exam: Appearance:  Well Groomed     Behavior: Appropriate  Motor: Normal  Speech/Language:  Normal Rate  Affect: Appropriate  Mood: anxious  Thought process: normal  Thought content:   WNL  Sensory/Perceptual disturbances:   WNL  Orientation: oriented to person, place, time/date, situation, day of week, and month of year  Attention: Good  Concentration: Good  Memory: WNL  Fund of knowledge:  Good  Insight:   Good  Judgment:  Good  Impulse Control: Good   Risk Assessment: Danger to Self:  No Self-injurious Behavior: No Danger to Others: No Duty to Warn:no Physical Aggression / Violence:No  Access to Firearms a concern: No  Gang Involvement:No   Subjective: This was the first an office visit for the patient and he said he did create some anxiety being in the lobby for a few minutes but he was able to use some of his coping skills to help him manage his anxiety.  It has been very busy at work but has been a good stress for him.  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  He did meet with a new doctor and had some conversations with him.  He feels good about where he is medically.  He also feels that he is making good progress in terms of pushing past some of his anxiety especially in certain situations.  We will continue to use gradual exposure therapy paired with using his coping skills  to help him more in social situations that create anxiety.  He ask again about the possibly of a EMDR.  He is not sure he is completely ready yet although he has done some homework on what it looks like.  I will provide more information with him in the next session.  He does want to make sure he would be with a male therapist if we choose to go on that direction. He does contract for safety having no thoughts of hurting himself or anyone else. Interventions: Cognitive Behavioral Therapy and Dialectical Behavioral Therapy  Diagnosis: Generalized anxiety disorder with panic attacks  Plan: I will meet with the patient every 2 to 3 weeks via care agility.  Treatment plan: We will use cognitive behavioral therapy as well as elements of dialectical behavior therapy and person centered therapy.  Goals will be to process the core conflicts contributing to current anxiety and panic, identify causes for anxiety and explore ways to lower it while improving his ability to manage anxiety symptoms and better handle stress.  We will also use cognitive behavioral therapy reframing to help him manage thoughts and worrisome thinking's contributing to feelings of anxiety.  Interventions will include providing education about anxiety, facilitate problem solution skills to help him identify and implement options for resolving stress, teach coping skills for managing anxiety, use cognitive behavioral therapy to identify and change anxiety provoking thoughts especially  related to history and current social anxieties, as well as teach dialectical behavior therapy distress tolerance and mindfulness skills.  Goals are to reduce anxiety by 50% with a target date of October 31, 2023.  Progress 35% We reviewed goals as stated above and will continue them with a new target date of April 29, 2024. French Ana, [facility]                  French Ana, [facility]               French Ana,  [facility]               French Ana, [facility]

## 2023-10-30 ENCOUNTER — Other Ambulatory Visit (HOSPITAL_BASED_OUTPATIENT_CLINIC_OR_DEPARTMENT_OTHER): Payer: Self-pay | Admitting: Family Medicine

## 2023-10-30 ENCOUNTER — Other Ambulatory Visit (HOSPITAL_BASED_OUTPATIENT_CLINIC_OR_DEPARTMENT_OTHER): Payer: Self-pay

## 2023-10-30 DIAGNOSIS — I10 Essential (primary) hypertension: Secondary | ICD-10-CM

## 2023-10-30 MED ORDER — LOSARTAN POTASSIUM 100 MG PO TABS
100.0000 mg | ORAL_TABLET | Freq: Every day | ORAL | 1 refills | Status: DC
  Filled 2023-10-30: qty 30, 30d supply, fill #0
  Filled 2023-11-28: qty 30, 30d supply, fill #1

## 2023-11-01 ENCOUNTER — Other Ambulatory Visit (HOSPITAL_BASED_OUTPATIENT_CLINIC_OR_DEPARTMENT_OTHER): Payer: Self-pay

## 2023-11-07 ENCOUNTER — Other Ambulatory Visit: Payer: Self-pay

## 2023-11-07 NOTE — Progress Notes (Signed)
 Specialty Pharmacy Refill Coordination Note  Bruce Woods is a 63 y.o. male contacted today regarding refills of specialty medication(s) Bictegravir-Emtricitab-Tenofov (Biktarvy )   Patient requested Delivery   Delivery date: 11/15/23   Verified address: [address]  [place] [address]   Medication will be filled on 11/14/23.

## 2023-11-14 ENCOUNTER — Other Ambulatory Visit: Payer: Self-pay

## 2023-11-15 DIAGNOSIS — L570 Actinic keratosis: Secondary | ICD-10-CM | POA: Diagnosis not present

## 2023-11-21 ENCOUNTER — Other Ambulatory Visit (HOSPITAL_BASED_OUTPATIENT_CLINIC_OR_DEPARTMENT_OTHER): Payer: Self-pay

## 2023-11-21 ENCOUNTER — Other Ambulatory Visit: Payer: Self-pay

## 2023-11-23 ENCOUNTER — Ambulatory Visit: Payer: Commercial Managed Care - PPO | Admitting: Behavioral Health

## 2023-11-28 ENCOUNTER — Other Ambulatory Visit (HOSPITAL_BASED_OUTPATIENT_CLINIC_OR_DEPARTMENT_OTHER): Payer: Self-pay

## 2023-12-04 ENCOUNTER — Other Ambulatory Visit (HOSPITAL_COMMUNITY): Payer: Self-pay | Admitting: Pharmacy Technician

## 2023-12-04 ENCOUNTER — Other Ambulatory Visit (HOSPITAL_COMMUNITY): Payer: Self-pay

## 2023-12-04 NOTE — Progress Notes (Signed)
Specialty Pharmacy Refill Coordination Note  Bruce Woods is a 63 y.o. male contacted today regarding refills of specialty medication(s) Bictegravir-Emtricitab-Tenofov [name])   Patient requested Delivery   Delivery date: 12/15/23   Verified address: Patient address [address]  [place]   Medication will be filled on 12/14/23.

## 2023-12-11 ENCOUNTER — Other Ambulatory Visit (HOSPITAL_BASED_OUTPATIENT_CLINIC_OR_DEPARTMENT_OTHER): Payer: Self-pay

## 2023-12-14 ENCOUNTER — Other Ambulatory Visit: Payer: Self-pay

## 2023-12-14 ENCOUNTER — Other Ambulatory Visit (HOSPITAL_COMMUNITY): Payer: Self-pay

## 2023-12-18 ENCOUNTER — Other Ambulatory Visit (HOSPITAL_BASED_OUTPATIENT_CLINIC_OR_DEPARTMENT_OTHER): Payer: Self-pay

## 2023-12-25 ENCOUNTER — Other Ambulatory Visit (HOSPITAL_BASED_OUTPATIENT_CLINIC_OR_DEPARTMENT_OTHER): Payer: Self-pay | Admitting: Family Medicine

## 2023-12-25 ENCOUNTER — Other Ambulatory Visit (HOSPITAL_BASED_OUTPATIENT_CLINIC_OR_DEPARTMENT_OTHER): Payer: Self-pay

## 2023-12-25 DIAGNOSIS — I10 Essential (primary) hypertension: Secondary | ICD-10-CM

## 2023-12-25 MED ORDER — LOSARTAN POTASSIUM 100 MG PO TABS
100.0000 mg | ORAL_TABLET | Freq: Every day | ORAL | 1 refills | Status: DC
  Filled 2023-12-25: qty 30, 30d supply, fill #0
  Filled 2024-01-24: qty 30, 30d supply, fill #1

## 2023-12-26 ENCOUNTER — Ambulatory Visit (HOSPITAL_BASED_OUTPATIENT_CLINIC_OR_DEPARTMENT_OTHER): Payer: Self-pay | Admitting: Family Medicine

## 2023-12-26 NOTE — Telephone Encounter (Signed)
 Copied from CRM [id number]. Topic: Clinical - Red Word Triage >> Dec 26, 2023 11:27 AM Maxwell Marion wrote: [facility] that prompted transfer to Nurse Triage: Patient's blood sugar has been running in the 40's, even after eating this morning and he's feeling bad   Chief Complaint: Blood Glucose Low Symptoms: Lightheadedness, Syncope Frequency: One month and half Pertinent Negatives: Patient denies diaphoresis, Tachycardia Disposition: [] ED /[] Urgent Care (no appt availability in office) / [x] Appointment(In office/virtual)/ []  [facility]/ [] Home Care/ [] Refused Recommended Disposition /[] [facility]/ []  Follow-up with PCP Additional Notes: [place] is being triaged for low blood glucose levels. The patient reports having had low blood glucose levels. The patient reports these symptoms being ongoing for at least a month now. The patient is not diagnosed with diabetes nor does he take any antidiabetic medications or insulin. The patient reports the onset of symptoms around 2 hours postprandial. Despite efforts to maintain blood sugars at optimal levels, the patient is still experiencing these episodes. No in office availability for tomorrow, per patient preference scheduled for Thursday.   Reason for Disposition  [1] Blood glucose 70  mg/dL (3.9 mmol/L) or below OR symptomatic, now improved with Care Advice AND [2] cause unknown  Answer Assessment - Initial Assessment Questions 1. SYMPTOMS: "What symptoms are you concerned about?"     Near fainting episodes  2. ONSET:  "When did the symptoms start?"     One month and a half  3. BLOOD GLUCOSE: "What is your blood glucose level?"      42 this morning, currently 64, after two orange juices  4. USUAL RANGE: "What is your blood glucose level usually?" (e.g., usual fasting morning value, usual evening value)     Usually normal  5. TYPE 1 or 2:  "Do you know what type of diabetes you have?"  (e.g., Type 1, Type 2, Gestational; doesn't  know)      Neither  6. INSULIN: "Do you take insulin?" "What type of insulin(s) do you use? What is the mode of delivery? (syringe, pen; injection or pump) "When did you last give yourself an insulin dose?" (i.e., time or hours/minutes ago) "How much did you give?" (i.e., how many units)     No  7. DIABETES PILLS: "Do you take any pills for your diabetes?" If Yes, ask: "What is the name of the medicine(s) that you take for high blood sugar?"     No  8. OTHER SYMPTOMS: "Do you have any symptoms?" (e.g., fever, frequent urination, difficulty breathing, vomiting)     Fatigue  9. LOW BLOOD GLUCOSE TREATMENT: "What have you done so far to treat the low blood glucose level?"     Drinking orange juice levels  10. FOOD: "When did you last eat or drink?"       This morning, had a hearty breakfast  11. ALONE: "Are you alone right now or is someone with you?"        No, At work with others  Protocols used: Diabetes - Low Blood Sugar-A-AH

## 2023-12-27 ENCOUNTER — Encounter (HOSPITAL_BASED_OUTPATIENT_CLINIC_OR_DEPARTMENT_OTHER): Payer: Self-pay | Admitting: Family Medicine

## 2023-12-28 ENCOUNTER — Encounter (HOSPITAL_BASED_OUTPATIENT_CLINIC_OR_DEPARTMENT_OTHER): Payer: Self-pay | Admitting: Family Medicine

## 2023-12-28 ENCOUNTER — Ambulatory Visit (HOSPITAL_BASED_OUTPATIENT_CLINIC_OR_DEPARTMENT_OTHER): Payer: Commercial Managed Care - PPO | Admitting: Family Medicine

## 2023-12-28 VITALS — BP 169/96 | HR 58 | Ht 64.0 in | Wt 198.0 lb

## 2023-12-28 DIAGNOSIS — E162 Hypoglycemia, unspecified: Secondary | ICD-10-CM

## 2023-12-28 NOTE — Progress Notes (Signed)
 Subjective:   Bruce Woods November 26, 1960 12/28/2023  Chief Complaint  Patient presents with   Hypoglycemia    Patient states 6 weeks ago he noticed that his blood sugar had begun to drop. Sugar would immediately drop quickly and has been in the 40s, lowest was 38. States when his sugar does drop, will develop a headache, and he will also almost pass out and will be completely wiped out after he has an episode.    HPI: Bruce Woods presents today for re-assessment and management of chronic medical conditions.   HYPOGLYCEMIA:  Patient is a 63 year old male with history of primary hypertension, vitamin D deficiency, TIA, HLD, HIV who presents for new onset of hypoglycemic episodes without diagnosis of diabetes.  Patient is a Engineer, civil (consulting) at [employer].Ongoing for 6 weeks. Reports first episode occurred while standing and patient had tunnel vision and had syncopal episode. Glucose was 44 at the time. He was given 4 glasses of OJ and Coke and brought it up to 80-90.  He denies tremors, diaphoresis, tachycardia, or shortness of breath or chest pain at the time of this episode.  Reports no other episodes during that day.  Reports most recent episode happened on 12/26/2023 in which his glucose dropped to a range of 38-40.  This was corrected with food and drink and back up into the 80s to 90s.  Denies any other episodes that occurred that day.  At this episode he also did not become diaphoretic, tachycardic, short of breath, or having any chest pain or tremors.  Denies syncopal episode.  He is having mild to moderate weakness and headaches post event.  Patient does have a history of hypertension and is currently on losartan 100 mg.  Reports average blood pressure 156/89, heart rate 50-83.  Previously unable to tolerate beta-blockers.  Patient reports prior to having hypoglycemic episodes he was not eating breakfast.  He states he has since changed dietary intake and is having for breakfast either a  biscuit, bacon egg and cheese sandwich, some source of protein and bread.  For lunch he will eat a sandwich or burger around 2 PM.  He does eat dinner frequently.  He does walk approximately 5 to 6 miles a day at work.  Denies excessive alcohol intake and reports only drinking 1 drink when out to eat.   The following portions of the patient's history were reviewed and updated as appropriate: past medical history, past surgical history, family history, social history, allergies, medications, and problem list.   Patient Active Problem List   Diagnosis Date Noted   Hyperlipidemia 10/01/2023   Hypokalemia 01/03/2023   TIA (transient ischemic attack) 01/02/2023   Elevated serum creatinine 10/05/2022   Polycythemia 10/05/2022   Hiatal hernia 09/13/2022   S/P Nissen fundoplication (without gastrostomy tube) procedure 09/13/2022   Vitamin D deficiency 06/21/2022   Basal cell carcinoma 12/16/2021   Hypertension, essential 10/27/2020   Anxiety 10/14/2020   Insomnia 11/19/2013   Wellness examination 10/01/2013   Erectile dysfunction 08/19/2011   Depression 01/03/2007   Human immunodeficiency virus (HIV) disease (HCC) 08/18/2006   SARCOIDOSIS 08/18/2006   Past Medical History:  Diagnosis Date   Allergy    Angio-edema suspected of small bowel question due to amlodipine 06/02/2022   Anxiety    Complication of anesthesia 07/28/2022   oxygen desaturations to 60's during EGD, bronchospasm after scope removed, reported being told he has suspected undiagnosed OSA   COVID-19 07/2021   DEPRESSION  Gastritis and gastroduodenitis    GERD (gastroesophageal reflux disease)    History of hiatal hernia    HIV DISEASE    Hyperlipidemia 10/01/2023   Hypertension    IBS (irritable bowel syndrome)    hx of   Sarcoidosis 2000   question of   Sleep apnea    TIA (transient ischemic attack) 12/2022   occipital TIA-NO residual effects   Ulnar nerve compression, right    Past Surgical History:   Procedure Laterality Date   COLONOSCOPY  2010   [place], -unknown prep(exc)-normal   ESOPHAGOGASTRODUODENOSCOPY  2002   HERNIA REPAIR     LUNG SURGERY  10/31/1998   vats   TONSILLECTOMY AND ADENOIDECTOMY     ULNAR NERVE TRANSPOSITION Right 05/17/2018   Procedure: RIGHT ULNAR NERVE DECOMPRESSION/TRANSPOSITION;  Surgeon: Betha Loa, MD;  Location: [facility];  Service: Orthopedics;  Laterality: Right;   XI ROBOTIC ASSISTED HIATAL HERNIA REPAIR N/A 09/13/2022   Procedure: XI ROBOTIC ASSISTED HIATAL HERNIA REPAIR WITH FUNDOPLICATION WITH MESH;  Surgeon: Axel Filler, MD;  Location: [facility] OR;  Service: General;  Laterality: N/A;   Family History  Problem Relation Age of Onset   Hyperlipidemia Mother    Hypertension Mother    Cancer Mother        glioblastoma   Sudden death Neg Hx    Heart attack Neg Hx    Diabetes Neg Hx    Colon cancer Neg Hx    Prostate cancer Neg Hx    Colon polyps Neg Hx    Esophageal cancer Neg Hx    Rectal cancer Neg Hx    Stomach cancer Neg Hx    Outpatient Medications Prior to Visit  Medication Sig Dispense Refill   anastrozole (ARIMIDEX) 1 MG tablet Take 1 tablet (1 mg total) by mouth once a week. 20 tablet 1   aspirin EC 81 MG tablet Take 1 tablet (81 mg total) by mouth daily. Swallow whole. 90 tablet 1   bictegravir-emtricitabine-tenofovir AF (BIKTARVY) 50-200-25 MG TABS tablet Take 1 tablet by mouth daily. 30 tablet 11   citalopram (CELEXA) 20 MG tablet Take 1 tablet (20 mg total) by mouth daily. 90 tablet 3   losartan (COZAAR) 100 MG tablet Take 1 tablet (100 mg total) by mouth daily. 30 tablet 1   rosuvastatin (CRESTOR) 20 MG tablet Take 1 tablet (20 mg total) by mouth daily. 90 tablet 1   tadalafil (CIALIS) 5 MG tablet Take 1 tablet (5 mg total) by mouth daily. 90 tablet 3   testosterone cypionate (DEPOTESTOSTERONE CYPIONATE) 200 MG/ML injection Inject 0.5 mLs (100 mg total) into the muscle once a week. 10 mL 0   zolpidem  (AMBIEN) 5 MG tablet Take 1 tablet (5 mg total) by mouth at bedtime as needed for sleep. 30 tablet 1   Facility-Administered Medications Prior to Visit  Medication Dose Route Frequency Provider Last Rate Last Admin   0.9 %  sodium chloride infusion  500 mL Intravenous Continuous Iva Boop, MD       Allergies  Allergen Reactions   Amlodipine Besylate Swelling    Suspected angioedema of bowel   Cepacol [Benzocaine-Menthol] Anaphylaxis   Morphine Anaphylaxis and Other (See Comments)    Resp distress, rash, tachycardia   Vibramycin [Doxycycline] Nausea And Vomiting   Celebrex [Celecoxib] Other (See Comments)    Elevated LFT's   Wellbutrin [Bupropion] Hives     ROS: A complete ROS was performed with pertinent positives/negatives noted in the HPI. The  remainder of the ROS are negative.    Objective:   Today's Vitals   12/28/23 1020  BP: (!) 169/96  Pulse: (!) 58  SpO2: 100%  Weight: 198 lb (89.8 kg)  Height: 5\' 4"  (1.626 m)    Physical Exam          GENERAL: Well-appearing, in NAD. Well nourished.  SKIN: Pink, warm and dry.  Head: Normocephalic. NECK: Trachea midline. Full ROM w/o pain or tenderness. No thyromegaly or palpable thyroid nodules. EYES: Conjunctiva clear without exudates. EOMI, PERRL, no drainage present.  RESPIRATORY: Chest wall symmetrical. Respirations even and non-labored. Breath sounds clear to auscultation bilaterally.  CARDIAC: S1, S2 present, bradycardic rate and rhythm without murmur or gallops. Peripheral pulses 2+ bilaterally.  Carotid arteries without bruit or thrill MSK: Muscle tone and strength appropriate for age.  EXTREMITIES: Without clubbing, cyanosis, or edema.  NEUROLOGIC: No motor or sensory deficits. Steady, even gait. C2-C12 intact.  PSYCH/MENTAL STATUS: Alert, oriented x 3. Cooperative, appropriate mood and affect.     Assessment & Plan:  1. Hypoglycemia without diagnosis of diabetes mellitus (Primary) New onset of undiagnosed  problem.  Concern for possible nutritional deficiency, diabetes onset, or thyroid disease contributing to patient's symptoms.  Patient instructed to increase protein intake in half frequent snacks throughout the day to see if this would help decrease episodes of hypoglycemia.  Discussed nutrition rich foods that are high in protein, lowering sugar with help to sustain glucose and prevent fluctuation in glucose level.  Will obtain fasting labs today to check insulin, C-peptide, A1c, CBC, CMP and thyroid panel to rule out possible causes of symptoms.  Discussed possible referral to endocrinology pending lab results.  Patient agreeable to plan of care - Insulin and C-Peptide - Hemoglobin A1c - CBC with Differential - Comprehensive metabolic panel - [id number]   No orders of the defined types were placed in this encounter.  Lab Orders         Insulin and C-Peptide         Hemoglobin A1c         CBC with Differential         Comprehensive metabolic panel         TSH+T4F+T3Free      Return for As Scheduled pending lab results.    Patient to reach out to office if new, worrisome, or unresolved symptoms arise or if no improvement in patient's condition. Patient verbalized understanding and is agreeable to treatment plan. All questions answered to patient's satisfaction.    Hilbert Bible, [place]

## 2023-12-29 ENCOUNTER — Encounter (HOSPITAL_BASED_OUTPATIENT_CLINIC_OR_DEPARTMENT_OTHER): Payer: Self-pay | Admitting: Family Medicine

## 2023-12-29 LAB — CBC WITH DIFFERENTIAL/PLATELET
Basophils Absolute: 0 10*3/uL (ref 0.0–0.2)
Basos: 0 %
EOS (ABSOLUTE): 0 10*3/uL (ref 0.0–0.4)
Eos: 1 %
Hematocrit: 48.4 % (ref 37.5–51.0)
Hemoglobin: 17 g/dL (ref 13.0–17.7)
Immature Grans (Abs): 0 10*3/uL (ref 0.0–0.1)
Immature Granulocytes: 0 %
Lymphocytes Absolute: 1.5 10*3/uL (ref 0.7–3.1)
Lymphs: 29 %
MCH: 33.7 pg — ABNORMAL HIGH (ref 26.6–33.0)
MCHC: 35.1 g/dL (ref 31.5–35.7)
MCV: 96 fL (ref 79–97)
Monocytes Absolute: 0.4 10*3/uL (ref 0.1–0.9)
Monocytes: 8 %
Neutrophils Absolute: 3.2 10*3/uL (ref 1.4–7.0)
Neutrophils: 62 %
Platelets: 155 10*3/uL (ref 150–450)
RBC: 5.05 x10E6/uL (ref 4.14–5.80)
RDW: 13.8 % (ref 11.6–15.4)
WBC: 5.2 10*3/uL (ref 3.4–10.8)

## 2023-12-29 LAB — TSH+T4F+T3FREE
Free T4: 0.95 ng/dL (ref 0.82–1.77)
T3, Free: 3.9 pg/mL (ref 2.0–4.4)
TSH: 2.74 u[IU]/mL (ref 0.450–4.500)

## 2023-12-29 LAB — INSULIN AND C-PEPTIDE, SERUM
C-Peptide: 4.3 ng/mL (ref 1.1–4.4)
INSULIN: 11.7 u[IU]/mL (ref 2.6–24.9)

## 2023-12-29 LAB — COMPREHENSIVE METABOLIC PANEL
ALT: 10 IU/L (ref 0–44)
AST: 17 IU/L (ref 0–40)
Albumin: 4.6 g/dL (ref 3.9–4.9)
Alkaline Phosphatase: 77 IU/L (ref 44–121)
BUN/Creatinine Ratio: 8 — ABNORMAL LOW (ref 10–24)
BUN: 11 mg/dL (ref 8–27)
Bilirubin Total: 1.1 mg/dL (ref 0.0–1.2)
CO2: 25 mmol/L (ref 20–29)
Calcium: 9.4 mg/dL (ref 8.6–10.2)
Chloride: 104 mmol/L (ref 96–106)
Creatinine, Ser: 1.31 mg/dL — ABNORMAL HIGH (ref 0.76–1.27)
Globulin, Total: 2.5 g/dL (ref 1.5–4.5)
Glucose: 86 mg/dL (ref 70–99)
Potassium: 4.4 mmol/L (ref 3.5–5.2)
Sodium: 143 mmol/L (ref 134–144)
Total Protein: 7.1 g/dL (ref 6.0–8.5)
eGFR: 61 mL/min/{1.73_m2} (ref >59)

## 2023-12-29 LAB — HEMOGLOBIN A1C
Est. average glucose Bld gHb Est-mCnc: 108 mg/dL
Hgb A1c MFr Bld: 5.4 % (ref 4.8–5.6)

## 2023-12-29 NOTE — Progress Notes (Signed)
 Hi Bruce Woods, Labs look great overall.  No signs of anemia, kidney function is stable.  Your fasting glucose was 86, liver function and electrolytes were normal.  Insulin and C-peptide did not show an acute cause for hypoglycemic episodes or over functioning.  A1c was normal and thyroid was normal as well.  Please try to increase frequent snacks protein throughout the day and if symptoms continue to occur please reach out and let me know.  We may need to get you into endocrinology.

## 2024-01-02 ENCOUNTER — Other Ambulatory Visit: Payer: Self-pay

## 2024-01-02 ENCOUNTER — Other Ambulatory Visit (HOSPITAL_COMMUNITY): Payer: Self-pay

## 2024-01-02 NOTE — Progress Notes (Signed)
 Specialty Pharmacy Refill Coordination Note  Bruce Woods is a 63 y.o. male contacted today regarding refills of specialty medication(s) No data recorded  Patient requested (Patient-Rptd) Delivery   Delivery date: (Patient-Rptd) 01/29/24   Verified address: (Patient-Rptd) [address] [place] [address]   Medication will be filled on 01/26/24.

## 2024-01-08 ENCOUNTER — Other Ambulatory Visit (HOSPITAL_BASED_OUTPATIENT_CLINIC_OR_DEPARTMENT_OTHER): Payer: Self-pay

## 2024-01-11 ENCOUNTER — Encounter: Payer: Self-pay | Admitting: Behavioral Health

## 2024-01-11 ENCOUNTER — Ambulatory Visit: Payer: Commercial Managed Care - PPO | Admitting: Behavioral Health

## 2024-01-11 DIAGNOSIS — F411 Generalized anxiety disorder: Secondary | ICD-10-CM

## 2024-01-11 DIAGNOSIS — F4310 Post-traumatic stress disorder, unspecified: Secondary | ICD-10-CM

## 2024-01-11 NOTE — Progress Notes (Signed)
 [facility] Counselor/Therapist Progress Note  Patient ID: ESTANISLADO SURGEON, MRN: [id number],    Date: 01/11/2024  Time Spent: 58 minutes, 4 PM until 4:58 PM This session was held via video teletherapy. The patient consented to the video teletherapy and was located in his car during this session. He is aware it is the responsibility of the patient to secure confidentiality on his end of the session. The provider was in a private home office for the duration of this session.    Treatment Type: Individual Therapy  Reported Symptoms: Stress, anxiety  Mental Status Exam: Appearance:  Well Groomed     Behavior: Appropriate  Motor: Normal  Speech/Language:  Normal Rate  Affect: Appropriate  Mood: anxious  Thought process: normal  Thought content:   WNL  Sensory/Perceptual disturbances:   WNL  Orientation: oriented to person, place, time/date, situation, day of week, and month of year  Attention: Good  Concentration: Good  Memory: WNL  Fund of knowledge:  Good  Insight:   Good  Judgment:  Good  Impulse Control: Good   Risk Assessment: Danger to Self:  No Self-injurious Behavior: No Danger to Others: No Duty to Warn:no Physical Aggression / Violence:No  Access to Firearms a concern: No  Gang Involvement:No   Subjective: The patient has been working 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  Just in the past couple of weeks have they started resuming a normal schedule but he is still putting in 60 hours.  He is exhausted.  On top of that he is not sleeping well in part because he cannot shut his mind down before going to bed but he also has been having dreams related to the incident we have been processing since beginning therapy with him.  There are some clear acknowledgment said that he was not responsible for but as we processed that more he is having more vivid memories which he knew to expect.  We looked at ways to  tamp that down as we continue to process this including distress tolerance skills, mindfulness exercises, and progressive muscle relaxation, breathing.  We talked about downloading some of those thoughts and an hour or so before he wanted to be asleep as a way of trying to not carry that into his sleep state.  I recommended that he consider EMDR therapy for his trauma but he is not ready to do that yet saying it took a lot for him to become comfortable in this therapy setting and he is not ready to share trauma details with anyone else yet.  He still is experiencing anxiety in certain situations and around certain people which include sweating and shaking.  He is becoming more aware of noticing the anxiety symptoms and either using brief coping skills or getting out of the situation as it allows him to.  He said some people noticed and he has acknowledged some anxiety with peers.  I reminded him that was accepted by many people and they understood and are not judging him because of that.  We talked in detail about some of the questions that he is asking himself from the incident says that took place recognizing again that he is not at fault but not understanding why it happened to him.  We will continue to process that more in future sessions.  He does contract for safety having no thoughts of hurting himself or anyone else. Interventions: Cognitive Behavioral Therapy and Dialectical  Behavioral Therapy  Diagnosis: Generalized anxiety disorder with panic attacks  Plan: I will meet with the patient every 2 to 3 weeks via care agility.  Treatment plan: We will use cognitive behavioral therapy as well as elements of dialectical behavior therapy and person centered therapy.  Goals will be to process the core conflicts contributing to current anxiety and panic, identify causes for anxiety and explore ways to lower it while improving his ability to manage anxiety symptoms and better handle stress.  We will also  use cognitive behavioral therapy reframing to help him manage thoughts and worrisome thinking's contributing to feelings of anxiety.  Interventions will include providing education about anxiety, facilitate problem solution skills to help him identify and implement options for resolving stress, teach coping skills for managing anxiety, use cognitive behavioral therapy to identify and change anxiety provoking thoughts especially related to history and current social anxieties, as well as teach dialectical behavior therapy distress tolerance and mindfulness skills.  Goals are to reduce anxiety by 50% with a target date of October 31, 2023.  Progress 35% We reviewed goals as stated above and the pt. Agrees to continue them with a new target date of April 29, 2024. French Ana, [facility]                  French Ana, [facility]               French Ana, [facility]               French Ana, [facility]               French Ana, [facility]

## 2024-01-12 DIAGNOSIS — E291 Testicular hypofunction: Secondary | ICD-10-CM | POA: Diagnosis not present

## 2024-01-12 DIAGNOSIS — Z125 Encounter for screening for malignant neoplasm of prostate: Secondary | ICD-10-CM | POA: Diagnosis not present

## 2024-01-17 ENCOUNTER — Other Ambulatory Visit (HOSPITAL_BASED_OUTPATIENT_CLINIC_OR_DEPARTMENT_OTHER): Payer: Self-pay

## 2024-01-24 ENCOUNTER — Other Ambulatory Visit (HOSPITAL_BASED_OUTPATIENT_CLINIC_OR_DEPARTMENT_OTHER): Payer: Self-pay

## 2024-01-26 ENCOUNTER — Other Ambulatory Visit: Payer: Self-pay

## 2024-01-26 ENCOUNTER — Ambulatory Visit
Admission: EM | Admit: 2024-01-26 | Discharge: 2024-01-26 | Disposition: A | Discharge status: Home / Self Care | Attending: Family Medicine | Admitting: Family Medicine

## 2024-01-26 ENCOUNTER — Other Ambulatory Visit (HOSPITAL_BASED_OUTPATIENT_CLINIC_OR_DEPARTMENT_OTHER): Payer: Self-pay

## 2024-01-26 DIAGNOSIS — U071 COVID-19: Secondary | ICD-10-CM | POA: Diagnosis not present

## 2024-01-26 DIAGNOSIS — B20 Human immunodeficiency virus [HIV] disease: Secondary | ICD-10-CM

## 2024-01-26 LAB — POC COVID19/FLU A&B COMBO
Covid Antigen, POC: POSITIVE — AB
Influenza A Antigen, POC: NEGATIVE
Influenza B Antigen, POC: NEGATIVE

## 2024-01-26 MED ORDER — PREDNISONE 20 MG PO TABS
40.0000 mg | ORAL_TABLET | Freq: Every day | ORAL | 0 refills | Status: DC
  Filled 2024-01-26: qty 10, 5d supply, fill #0

## 2024-01-26 NOTE — Discharge Instructions (Addendum)
 For sore throat or cough try using a honey-based tea. Use 3 teaspoons of honey with juice squeezed from half lemon. Place shaved pieces of ginger into 1/2-1 cup of water and warm over stove top. Then mix the ingredients and repeat every 4 hours as needed. Please take Tylenol 500mg -650mg  once every 6 hours for fevers, aches and pains. Hydrate very well with at least 2 liters (64 ounces) of water. Eat light meals such as soups (chicken and noodles, chicken wild rice, vegetable).  Do not eat any foods that you are allergic to.  Start an antihistamine like Zyrtec (10mg  daily) for postnasal drainage, sinus congestion.  You can take this together with prednisone for your sinuses.

## 2024-01-26 NOTE — ED Provider Notes (Signed)
 [facility]  Note:  This document was prepared using Conservation officer, historic buildings and may include unintentional dictation errors.  MRN: [id number] DOB: 1960/12/27  Subjective:   Bruce Woods is a 63 y.o. male presenting for 3-day history of acute onset sinus pressure, sinus drainage, sinus fullness, coughing.  Patient had exposure to COVID-19, needs to be tested.  Was sent home from work today.  No chest pain, shortness of breath, wheezing, chest congestion.  No history of asthma.  He does have allergic rhinitis.  Patient does have HIV disease, takes Biktarvy and is very medically compliant.  No smoking of any kind including cigarettes, cigars, vaping, marijuana use.     Current Facility-Administered Medications:    0.9 %  sodium chloride infusion, 500 mL, Intravenous, Continuous, Iva Boop, MD  Current Outpatient Medications:    anastrozole (ARIMIDEX) 1 MG tablet, Take 1 tablet (1 mg total) by mouth once a week., Disp: 20 tablet, Rfl: 1   aspirin EC 81 MG tablet, Take 1 tablet (81 mg total) by mouth daily. Swallow whole., Disp: 90 tablet, Rfl: 1   bictegravir-emtricitabine-tenofovir AF (BIKTARVY) 50-200-25 MG TABS tablet, Take 1 tablet by mouth daily., Disp: 30 tablet, Rfl: 11   citalopram (CELEXA) 20 MG tablet, Take 1 tablet (20 mg total) by mouth daily., Disp: 90 tablet, Rfl: 3   losartan (COZAAR) 100 MG tablet, Take 1 tablet (100 mg total) by mouth daily., Disp: 30 tablet, Rfl: 1   rosuvastatin (CRESTOR) 20 MG tablet, Take 1 tablet (20 mg total) by mouth daily., Disp: 90 tablet, Rfl: 1   tadalafil (CIALIS) 5 MG tablet, Take 1 tablet (5 mg total) by mouth daily., Disp: 90 tablet, Rfl: 3   testosterone cypionate (DEPOTESTOSTERONE CYPIONATE) 200 MG/ML injection, Inject 0.5 mLs (100 mg total) into the muscle once a week., Disp: 10 mL, Rfl: 0   zolpidem (AMBIEN) 5 MG tablet, Take 1 tablet (5 mg total) by mouth at bedtime as needed for sleep., Disp: 30  tablet, Rfl: 1   Allergies  Allergen Reactions   Amlodipine Besylate Swelling    Suspected angioedema of bowel   Cepacol [Benzocaine-Menthol] Anaphylaxis   Morphine Anaphylaxis and Other (See Comments)    Resp distress, rash, tachycardia   Vibramycin [Doxycycline] Nausea And Vomiting   Celebrex [Celecoxib] Other (See Comments)    Elevated LFT's   Wellbutrin [Bupropion] Hives    Past Medical History:  Diagnosis Date   Allergy    Angio-edema suspected of small bowel question due to amlodipine 06/02/2022   Anxiety    Complication of anesthesia 07/28/2022   oxygen desaturations to 60's during EGD, bronchospasm after scope removed, reported being told he has suspected undiagnosed OSA   COVID-19 07/2021   DEPRESSION    Gastritis and gastroduodenitis    GERD (gastroesophageal reflux disease)    History of hiatal hernia    HIV DISEASE    Hyperlipidemia 10/01/2023   Hypertension    IBS (irritable bowel syndrome)    hx of   Sarcoidosis 2000   question of   Sleep apnea    TIA (transient ischemic attack) 12/2022   occipital TIA-NO residual effects   Ulnar nerve compression, right      Past Surgical History:  Procedure Laterality Date   COLONOSCOPY  2010   [place], [place]-unknown prep(exc)-normal   ESOPHAGOGASTRODUODENOSCOPY  2002   HERNIA REPAIR     LUNG SURGERY  10/31/1998   vats   TONSILLECTOMY AND ADENOIDECTOMY  ULNAR NERVE TRANSPOSITION Right 05/17/2018   Procedure: RIGHT ULNAR NERVE DECOMPRESSION/TRANSPOSITION;  Surgeon: Betha Loa, MD;  Location: [facility];  Service: Orthopedics;  Laterality: Right;   XI ROBOTIC ASSISTED HIATAL HERNIA REPAIR N/A 09/13/2022   Procedure: XI ROBOTIC ASSISTED HIATAL HERNIA REPAIR WITH FUNDOPLICATION WITH MESH;  Surgeon: Axel Filler, MD;  Location: [facility] OR;  Service: General;  Laterality: N/A;    Family History  Problem Relation Age of Onset   Hyperlipidemia Mother    Hypertension Mother    Cancer Mother         glioblastoma   Sudden death Neg Hx    Heart attack Neg Hx    Diabetes Neg Hx    Colon cancer Neg Hx    Prostate cancer Neg Hx    Colon polyps Neg Hx    Esophageal cancer Neg Hx    Rectal cancer Neg Hx    Stomach cancer Neg Hx     Social History   Tobacco Use   Smoking status: Former    Current packs/day: 0.00    Average packs/day: 1 pack/day for 2.0 years (2.0 ttl pk-yrs)    Types: Cigarettes    Start date: 10/31/1981    Quit date: 11/01/1983    Years since quitting: 40.2   Smokeless tobacco: Never  Vaping Use   Vaping status: Never Used  Substance Use Topics   Alcohol use: Yes    Alcohol/week: 1.0 standard drink of alcohol    Types: 1 Standard drinks or equivalent per week    Comment: Social   Drug use: No    ROS   Objective:   Vitals: BP (!) 151/88 (BP Location: Right Arm)   Pulse 72   Temp 98.7 F (37.1 C) (Oral)   Resp 20   SpO2 97%   Physical Exam Constitutional:      General: He is not in acute distress.    Appearance: Normal appearance. He is well-developed. He is not ill-appearing, toxic-appearing or diaphoretic.  HENT:     Head: Normocephalic and atraumatic.     Right Ear: External ear normal.     Left Ear: External ear normal.     Nose: Nose normal.     Mouth/Throat:     Mouth: Mucous membranes are moist.  Eyes:     General: No scleral icterus.       Right eye: No discharge.        Left eye: No discharge.     Extraocular Movements: Extraocular movements intact.  Cardiovascular:     Rate and Rhythm: Normal rate and regular rhythm.     Heart sounds: Normal heart sounds. No murmur heard.    No friction rub. No gallop.  Pulmonary:     Effort: Pulmonary effort is normal. No respiratory distress.     Breath sounds: Normal breath sounds. No stridor. No wheezing, rhonchi or rales.  Neurological:     Mental Status: He is alert and oriented to person, place, and time.  Psychiatric:        Mood and Affect: Mood normal.        Behavior: Behavior  normal.        Thought Content: Thought content normal.     Results for orders placed or performed during the hospital encounter of 01/26/24 (from the past 24 hours)  POC Covid + Flu A/B Antigen     Status: Abnormal   Collection Time: 01/26/24 10:52 AM  Result Value Ref Range   Influenza  A Antigen, POC Negative    Influenza B Antigen, POC Negative    Covid Antigen, POC Positive (A)     Assessment and Plan :   PDMP not reviewed this encounter.  1. COVID-19 virus infection   2. HIV disease [facility])    Patient is not interested in COVID antivirals.  He would like a trial of prednisone to help with his respiratory symptoms.  Recommended supportive care otherwise.  Deferred imaging given clear cardiopulmonary exam, hemodynamically stable vital signs.  Requested a note to return to work on Tuesday and I was agreeable to this.  Counseled patient on potential for adverse effects with medications prescribed/recommended today, ER and return-to-clinic precautions discussed, patient verbalized understanding.    Wallis Bamberg, PA-C 01/26/24 1101

## 2024-01-26 NOTE — ED Triage Notes (Signed)
 Pt c/o cough, head congestion, sinus pain/pressure x 3 days, fatigue-NAD-steady gait

## 2024-01-28 ENCOUNTER — Other Ambulatory Visit: Payer: Self-pay | Admitting: Family

## 2024-01-28 DIAGNOSIS — F3342 Major depressive disorder, recurrent, in full remission: Secondary | ICD-10-CM

## 2024-01-30 ENCOUNTER — Other Ambulatory Visit (HOSPITAL_BASED_OUTPATIENT_CLINIC_OR_DEPARTMENT_OTHER): Payer: Self-pay | Admitting: Family Medicine

## 2024-01-31 ENCOUNTER — Other Ambulatory Visit (HOSPITAL_BASED_OUTPATIENT_CLINIC_OR_DEPARTMENT_OTHER): Payer: Self-pay

## 2024-01-31 MED ORDER — ZOLPIDEM TARTRATE 5 MG PO TABS
5.0000 mg | ORAL_TABLET | Freq: Every evening | ORAL | 1 refills | Status: DC | PRN
  Filled 2024-01-31: qty 30, 30d supply, fill #0
  Filled 2024-04-18: qty 30, 30d supply, fill #1

## 2024-02-08 ENCOUNTER — Other Ambulatory Visit (HOSPITAL_BASED_OUTPATIENT_CLINIC_OR_DEPARTMENT_OTHER): Payer: Self-pay | Admitting: Family Medicine

## 2024-02-09 ENCOUNTER — Other Ambulatory Visit (HOSPITAL_BASED_OUTPATIENT_CLINIC_OR_DEPARTMENT_OTHER): Payer: Self-pay

## 2024-02-09 MED ORDER — ASPIRIN 81 MG PO TBEC
81.0000 mg | DELAYED_RELEASE_TABLET | Freq: Every day | ORAL | 1 refills | Status: DC
  Filled 2024-02-09: qty 90, 90d supply, fill #0
  Filled 2024-05-04: qty 90, 90d supply, fill #1

## 2024-02-14 ENCOUNTER — Other Ambulatory Visit (HOSPITAL_COMMUNITY): Payer: Self-pay

## 2024-02-14 ENCOUNTER — Other Ambulatory Visit: Payer: Self-pay

## 2024-02-14 NOTE — Progress Notes (Signed)
 Specialty Pharmacy Refill Coordination Note  Bruce Woods is a 63 y.o. male contacted today regarding refills of specialty medication(s) Biktarvy.  Patient requested (Patient-Rptd) Delivery   Delivery date: (Patient-Rptd) 03/04/24   Verified address: (Patient-Rptd) [phone],  [facility] [place] [address]   Medication will be filled on 03/01/24.

## 2024-02-19 ENCOUNTER — Other Ambulatory Visit: Payer: Self-pay

## 2024-02-19 ENCOUNTER — Other Ambulatory Visit (HOSPITAL_BASED_OUTPATIENT_CLINIC_OR_DEPARTMENT_OTHER): Payer: Self-pay | Admitting: Family Medicine

## 2024-02-19 ENCOUNTER — Other Ambulatory Visit (HOSPITAL_BASED_OUTPATIENT_CLINIC_OR_DEPARTMENT_OTHER): Payer: Self-pay

## 2024-02-19 DIAGNOSIS — I10 Essential (primary) hypertension: Secondary | ICD-10-CM

## 2024-02-19 MED ORDER — LOSARTAN POTASSIUM 100 MG PO TABS
100.0000 mg | ORAL_TABLET | Freq: Every day | ORAL | 1 refills | Status: DC
  Filled 2024-02-19: qty 30, 30d supply, fill #0
  Filled 2024-03-21: qty 30, 30d supply, fill #1

## 2024-02-20 ENCOUNTER — Encounter (HOSPITAL_BASED_OUTPATIENT_CLINIC_OR_DEPARTMENT_OTHER): Payer: Self-pay | Admitting: Family Medicine

## 2024-02-20 DIAGNOSIS — F3342 Major depressive disorder, recurrent, in full remission: Secondary | ICD-10-CM

## 2024-02-21 ENCOUNTER — Other Ambulatory Visit: Payer: Self-pay

## 2024-02-21 ENCOUNTER — Other Ambulatory Visit (HOSPITAL_BASED_OUTPATIENT_CLINIC_OR_DEPARTMENT_OTHER): Payer: Self-pay

## 2024-02-21 MED ORDER — CITALOPRAM HYDROBROMIDE 20 MG PO TABS
20.0000 mg | ORAL_TABLET | Freq: Every day | ORAL | 3 refills | Status: DC
  Filled 2024-02-21: qty 90, 90d supply, fill #0
  Filled 2024-05-17: qty 90, 90d supply, fill #1
  Filled 2024-07-08 – 2024-07-29 (×2): qty 90, 90d supply, fill #2
  Filled 2024-08-09 – 2024-11-13 (×2): qty 90, 90d supply, fill #3

## 2024-02-21 NOTE — Telephone Encounter (Signed)
 Mychart message sent by pt:  Bruce Woods Dwb-Primary Care Clinical (supporting Pamella Boer Peru, MD)17 hours ago (5:56 PM)    Can you ck to see where we are on my request for 20mg  citalopram  PO qday ? I am getting very low, less than 3 days left. Thank you so much    Dr. De Peru, please advise on this for pt.

## 2024-03-01 ENCOUNTER — Other Ambulatory Visit: Payer: Self-pay

## 2024-03-13 NOTE — Progress Notes (Signed)
 The ASCVD Risk score (Arnett DK, et al., 2019) failed to calculate for the following reasons:   The valid total cholesterol range is 130 to 320 mg/dL  Arlon Bergamo, BSN, RN

## 2024-03-15 ENCOUNTER — Ambulatory Visit: Admitting: Behavioral Health

## 2024-03-21 ENCOUNTER — Other Ambulatory Visit: Payer: Self-pay

## 2024-03-21 ENCOUNTER — Encounter: Payer: Self-pay | Admitting: Pharmacist

## 2024-03-21 ENCOUNTER — Other Ambulatory Visit (HOSPITAL_COMMUNITY): Payer: Self-pay

## 2024-03-21 ENCOUNTER — Other Ambulatory Visit (HOSPITAL_BASED_OUTPATIENT_CLINIC_OR_DEPARTMENT_OTHER): Payer: Self-pay

## 2024-03-21 ENCOUNTER — Ambulatory Visit: Admitting: Family Medicine

## 2024-03-21 VITALS — BP 112/80 | Ht 68.0 in | Wt 194.0 lb

## 2024-03-21 DIAGNOSIS — M25561 Pain in right knee: Secondary | ICD-10-CM | POA: Diagnosis not present

## 2024-03-21 MED ORDER — METHOCARBAMOL 500 MG PO TABS
500.0000 mg | ORAL_TABLET | Freq: Three times a day (TID) | ORAL | 1 refills | Status: AC | PRN
Start: 2024-03-21 — End: ?
  Filled 2024-03-21 (×2): qty 60, 20d supply, fill #0

## 2024-03-21 MED ORDER — METHYLPREDNISOLONE ACETATE 40 MG/ML IJ SUSP
40.0000 mg | Freq: Once | INTRAMUSCULAR | Status: AC
  Administered 2024-03-21: 40 mg via INTRA_ARTICULAR

## 2024-03-21 NOTE — Patient Instructions (Signed)
 Get x-rays of your knee at your convenience. This is most likely a flare of arthritis that has affected your quads secondarily. Less likely radicular pain that's presenting in an unusual fashion. You were given a steroid injection today. Robaxin  as needed for spasms. Ibuprofen , tylenol  as needed. Send me a message in a week with an update on your status.

## 2024-03-21 NOTE — Progress Notes (Signed)
 PCP: de Peru, Alonza Jansky, MD  Subjective:   HPI: Patient is a 63 y.o. male here for right knee/thigh pain.  Patient reports about a year ago he thought maybe he overdid it in the yard. Developed anterior right knee pain. Over time has worsened. Includes/radiates up to right anterior thigh. Worse at night and sometimes cannot get comfortable. No acute injury though. Has tried heat which helps. Voltaren  gel and ibuprofen  also taken as well as his spouse's robaxin .  Past Medical History:  Diagnosis Date   Allergy    Angio-edema suspected of small bowel question due to amlodipine  06/02/2022   Anxiety    Complication of anesthesia 07/28/2022   oxygen desaturations to 60's during EGD, bronchospasm after scope removed, reported being told he has suspected undiagnosed OSA   COVID-19 07/2021   DEPRESSION    Gastritis and gastroduodenitis    GERD (gastroesophageal reflux disease)    History of hiatal hernia    HIV DISEASE    Hyperlipidemia 10/01/2023   Hypertension    IBS (irritable bowel syndrome)    hx of   Sarcoidosis 2000   question of   Sleep apnea    TIA (transient ischemic attack) 12/2022   occipital TIA-NO residual effects   Ulnar nerve compression, right     Current Outpatient Medications on File Prior to Visit  Medication Sig Dispense Refill   anastrozole  (ARIMIDEX ) 1 MG tablet Take 1 tablet (1 mg total) by mouth once a week. 20 tablet 1   aspirin  EC 81 MG tablet Take 1 tablet (81 mg total) by mouth daily. Swallow whole. 90 tablet 1   bictegravir-emtricitabine -tenofovir  AF (BIKTARVY ) 50-200-25 MG TABS tablet Take 1 tablet by mouth daily. 30 tablet 11   citalopram  (CELEXA ) 20 MG tablet Take 1 tablet (20 mg total) by mouth daily. 90 tablet 3   losartan  (COZAAR ) 100 MG tablet Take 1 tablet (100 mg total) by mouth daily. 30 tablet 1   predniSONE  (DELTASONE ) 20 MG tablet Take 2 tablets (40 mg total) by mouth daily with breakfast. 10 tablet 0   rosuvastatin  (CRESTOR ) 20 MG  tablet Take 1 tablet (20 mg total) by mouth daily. 90 tablet 1   tadalafil  (CIALIS ) 5 MG tablet Take 1 tablet (5 mg total) by mouth daily. 90 tablet 3   testosterone  cypionate (DEPOTESTOSTERONE CYPIONATE) 200 MG/ML injection Inject 0.5 mLs (100 mg total) into the muscle once a week. 10 mL 0   zolpidem  (AMBIEN ) 5 MG tablet Take 1 tablet (5 mg total) by mouth at bedtime as needed for sleep. 30 tablet 1   Current Facility-Administered Medications on File Prior to Visit  Medication Dose Route Frequency Provider Last Rate Last Admin   0.9 %  sodium chloride  infusion  500 mL Intravenous Continuous Kenney Peacemaker, MD        Past Surgical History:  Procedure Laterality Date   COLONOSCOPY  2010   [place], [place]-unknown prep(exc)-normal   ESOPHAGOGASTRODUODENOSCOPY  2002   HERNIA REPAIR     LUNG SURGERY  10/31/1998   vats   TONSILLECTOMY AND ADENOIDECTOMY     ULNAR NERVE TRANSPOSITION Right 05/17/2018   Procedure: RIGHT ULNAR NERVE DECOMPRESSION/TRANSPOSITION;  Surgeon: Brunilda Capra, MD;  Location: [facility];  Service: Orthopedics;  Laterality: Right;   XI ROBOTIC ASSISTED HIATAL HERNIA REPAIR N/A 09/13/2022   Procedure: XI ROBOTIC ASSISTED HIATAL HERNIA REPAIR WITH FUNDOPLICATION WITH MESH;  Surgeon: Shela Derby, MD;  Location: [facility];  Service: General;  Laterality: N/A;  Allergies  Allergen Reactions   Amlodipine  Besylate Swelling    Suspected angioedema of bowel   Cepacol [Benzocaine-Menthol] Anaphylaxis   Morphine Anaphylaxis and Other (See Comments)    Resp distress, rash, tachycardia   Vibramycin [Doxycycline] Nausea And Vomiting   Celebrex [Celecoxib] Other (See Comments)    Elevated LFT's   Wellbutrin  [Bupropion ] Hives    BP 112/80   Ht 5\' 8"  (1.727 m)   Wt 194 lb (88 kg)   BMI 29.50 kg/m       No data to display              No data to display              Objective:  Physical Exam:  Gen: NAD, comfortable in exam room  Right  knee: No gross deformity, ecchymoses, swelling. TTP medial joint line. FROM with normal strength. Negative ant/post drawers. Negative valgus/varus testing. Negative lachman. Negative mcmurrays, apleys.  Mild pain with thessalys. NV intact distally.   Assessment & Plan:  1. Right knee pain - level is greater than I would expect from arthritis flare but has been going on a long time.  Radiation into quad area.  Will go ahead with x-rays.  Intraarticular injection given today.  Robaxin  as needed.  Ibuprofen , tylenol  as needed.  Update us  on his status in about a week.  After informed written consent timeout was performed, patient was seated on exam table. Right knee was prepped with alcohol swab and utilizing anteromedial approach, patient's right knee was injected intraarticularly with 3:1 lidocaine : depomedrol. Patient tolerated the procedure well without immediate complications.

## 2024-03-22 ENCOUNTER — Ambulatory Visit (HOSPITAL_COMMUNITY)
Admission: RE | Admit: 2024-03-22 | Discharge: 2024-03-22 | Disposition: A | Discharge status: Home / Self Care | Source: Ambulatory Visit | Attending: Family Medicine | Admitting: Family Medicine

## 2024-03-22 ENCOUNTER — Ambulatory Visit: Admitting: Behavioral Health

## 2024-03-22 ENCOUNTER — Other Ambulatory Visit (HOSPITAL_BASED_OUTPATIENT_CLINIC_OR_DEPARTMENT_OTHER): Payer: Self-pay

## 2024-03-22 ENCOUNTER — Other Ambulatory Visit: Payer: Self-pay

## 2024-03-22 DIAGNOSIS — M25561 Pain in right knee: Secondary | ICD-10-CM | POA: Diagnosis not present

## 2024-03-22 DIAGNOSIS — M898X8 Other specified disorders of bone, other site: Secondary | ICD-10-CM | POA: Diagnosis not present

## 2024-03-26 ENCOUNTER — Other Ambulatory Visit (HOSPITAL_COMMUNITY): Payer: Self-pay

## 2024-03-27 ENCOUNTER — Other Ambulatory Visit (HOSPITAL_COMMUNITY): Payer: Self-pay

## 2024-03-27 ENCOUNTER — Other Ambulatory Visit: Payer: Self-pay

## 2024-03-27 NOTE — Progress Notes (Signed)
 Specialty Pharmacy Refill Coordination Note  Bruce Woods is a 63 y.o. male contacted today regarding refills of specialty medication(s) Biktarvy .  Patient requested (Patient-Rptd) Delivery   Delivery date: (Patient-Rptd) 04/08/24   Verified address: (Patient-Rptd) [phone],  [place] [address]   Medication will be filled on 04/05/24.

## 2024-04-01 ENCOUNTER — Other Ambulatory Visit (HOSPITAL_BASED_OUTPATIENT_CLINIC_OR_DEPARTMENT_OTHER): Payer: Self-pay | Admitting: Family Medicine

## 2024-04-01 ENCOUNTER — Other Ambulatory Visit (HOSPITAL_BASED_OUTPATIENT_CLINIC_OR_DEPARTMENT_OTHER): Payer: Self-pay

## 2024-04-01 ENCOUNTER — Ambulatory Visit: Payer: Self-pay | Admitting: Family Medicine

## 2024-04-01 MED ORDER — ROSUVASTATIN CALCIUM 20 MG PO TABS
20.0000 mg | ORAL_TABLET | Freq: Every day | ORAL | 1 refills | Status: DC
  Filled 2024-04-01: qty 90, 90d supply, fill #0
  Filled 2024-07-02: qty 90, 90d supply, fill #1

## 2024-04-05 ENCOUNTER — Other Ambulatory Visit: Payer: Self-pay

## 2024-04-11 ENCOUNTER — Other Ambulatory Visit (HOSPITAL_BASED_OUTPATIENT_CLINIC_OR_DEPARTMENT_OTHER): Payer: Self-pay

## 2024-04-15 ENCOUNTER — Ambulatory Visit (INDEPENDENT_AMBULATORY_CARE_PROVIDER_SITE_OTHER): Payer: Commercial Managed Care - PPO | Admitting: Family Medicine

## 2024-04-15 ENCOUNTER — Encounter (HOSPITAL_BASED_OUTPATIENT_CLINIC_OR_DEPARTMENT_OTHER): Payer: Self-pay | Admitting: Family Medicine

## 2024-04-15 ENCOUNTER — Other Ambulatory Visit (HOSPITAL_BASED_OUTPATIENT_CLINIC_OR_DEPARTMENT_OTHER): Payer: Self-pay

## 2024-04-15 VITALS — BP 164/95 | HR 55 | Ht 68.0 in | Wt 190.0 lb

## 2024-04-15 DIAGNOSIS — L570 Actinic keratosis: Secondary | ICD-10-CM | POA: Insufficient documentation

## 2024-04-15 DIAGNOSIS — L821 Other seborrheic keratosis: Secondary | ICD-10-CM | POA: Insufficient documentation

## 2024-04-15 DIAGNOSIS — Z0000 Encounter for general adult medical examination without abnormal findings: Secondary | ICD-10-CM

## 2024-04-15 MED ORDER — CAPVAXIVE 0.5 ML IM SOSY
0.5000 mL | PREFILLED_SYRINGE | Freq: Once | INTRAMUSCULAR | 0 refills | Status: AC
  Filled 2024-04-15: qty 0.5, 1d supply, fill #0

## 2024-04-15 NOTE — Patient Instructions (Signed)
  Medication Instructions:  Your physician recommends that you continue on your current medications as directed. Please refer to the Current Medication list given to you today. --If you need a refill on any your medications before your next appointment, please call your pharmacy first. If no refills are authorized on file call the office.-- Lab Work: Your physician has recommended that you have lab work today: today  If you have labs (blood work) drawn today and your tests are completely normal, you will receive your results via MyChart message OR a phone call from our staff.  Please ensure you check your voicemail in the event that you authorized detailed messages to be left on a delegated number. If you have any lab test that is abnormal or we need to change your treatment, we will call you to review the results.   Follow-Up: Your next appointment:   Your physician recommends that you schedule a follow-up appointment in: 4-6 months follow up  with Dr. de Peru  You will receive a text message or e-mail with a link to a survey about your care and experience with us  today! We would greatly appreciate your feedback!   Thanks for letting us  be apart of your health journey!!  Primary Care and Sports Medicine   Dr. Court Distance Peru   We encourage you to activate your patient portal called MyChart.  Sign up information is provided on this After Visit Summary.  MyChart is used to connect with patients for Virtual Visits (Telemedicine).  Patients are able to view lab/test results, encounter notes, upcoming appointments, etc.  Non-urgent messages can be sent to your provider as well. To learn more about what you can do with MyChart, please visit --  [email or web address].

## 2024-04-15 NOTE — Assessment & Plan Note (Addendum)
 Routine HCM labs ordered. HCM reviewed/discussed. Anticipatory guidance regarding healthy weight, lifestyle and choices given. Recommend healthy diet.  Recommend approximately 150 minutes/week of moderate intensity exercise Recommend regular dental and vision exams Always use seatbelt/lap and shoulder restraints Recommend using smoke alarms and checking batteries at least twice a year Recommend using sunscreen when outside Discussed colon cancer screening recommendations, options.  Referral for colonoscopy placed Discussed recommendations for shingles vaccine.  Patient will look into this Discussed immunization recommendations For pneumococcal vaccine - has received 13 and 23 previously, based on history, recommend PCV21

## 2024-04-15 NOTE — Progress Notes (Signed)
 Subjective:    CC: Annual Physical Exam  HPI: Bruce Woods is a 63 y.o. presenting for annual physical  I reviewed the past medical history, family history, social history, surgical history, and allergies today and no changes were needed.  Please see the problem list section below in epic for further details.  Past Medical History: Past Medical History:  Diagnosis Date   Allergy    Angio-edema suspected of small bowel question due to amlodipine  06/02/2022   Anxiety    Complication of anesthesia 07/28/2022   oxygen desaturations to 60's during EGD, bronchospasm after scope removed, reported being told he has suspected undiagnosed OSA   COVID-19 07/2021   DEPRESSION    Gastritis and gastroduodenitis    GERD (gastroesophageal reflux disease)    History of hiatal hernia    HIV DISEASE    Hyperlipidemia 10/01/2023   Hypertension    IBS (irritable bowel syndrome)    hx of   Sarcoidosis 2000   question of   Sleep apnea    TIA (transient ischemic attack) 12/2022   occipital TIA-NO residual effects   Ulnar nerve compression, right    Past Surgical History: Past Surgical History:  Procedure Laterality Date   COLONOSCOPY  2010   [place], [place]-unknown prep(exc)-normal   ESOPHAGOGASTRODUODENOSCOPY  2002   HERNIA REPAIR     LUNG SURGERY  10/31/1998   vats   TONSILLECTOMY AND ADENOIDECTOMY     ULNAR NERVE TRANSPOSITION Right 05/17/2018   Procedure: RIGHT ULNAR NERVE DECOMPRESSION/TRANSPOSITION;  Surgeon: Brunilda Capra, MD;  Location: [facility];  Service: Orthopedics;  Laterality: Right;   XI ROBOTIC ASSISTED HIATAL HERNIA REPAIR N/A 09/13/2022   Procedure: XI ROBOTIC ASSISTED HIATAL HERNIA REPAIR WITH FUNDOPLICATION WITH MESH;  Surgeon: Shela Derby, MD;  Location: [facility];  Service: General;  Laterality: N/A;   Social History: Social History   Socioeconomic History   Marital status: Married    Spouse name: Not on file   Number of children: 2   Years  of education: Not on file   Highest education level: Bachelor's degree (e.g., BA, AB, BS)  Occupational History   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: [employer]  Tobacco Use   Smoking status: Former    Current packs/day: 0.00    Average packs/day: 1 pack/day for 2.0 years (2.0 ttl pk-yrs)    Types: Cigarettes    Start date: 10/31/1981    Quit date: 11/01/1983    Years since quitting: 40.4    Passive exposure: Past   Smokeless tobacco: Never  Vaping Use   Vaping status: Never Used  Substance and Sexual Activity   Alcohol use: Yes    Alcohol/week: 1.0 standard drink of alcohol    Types: 1 Standard drinks or equivalent per week    Comment: Social   Drug use: No   Sexual activity: Not Currently    Birth control/protection: Abstinence    Comment: declined condoms  Other Topics Concern   Not on file  Social History Narrative   Married to [place], daughters Abbe Abate   RN PACU, ED   Former smoker no drug use occasional alcohol   Social Drivers of Corporate investment banker Strain: Low Risk  (04/10/2024)   Overall Financial Resource Strain (CARDIA)    Difficulty of Paying Living Expenses: Not hard at all  Food Insecurity: No Food Insecurity (04/10/2024)   Hunger Vital Sign    Worried About Running Out  of Food in the Last Year: Never true    Ran Out of Food in the Last Year: Never true  Transportation Needs: No Transportation Needs (04/10/2024)   PRAPARE - Administrator, Civil Service (Medical): No    Lack of Transportation (Non-Medical): No  Physical Activity: Insufficiently Active (04/10/2024)   Exercise Vital Sign    Days of Exercise per Week: 4 days    Minutes of Exercise per Session: 30 min  Stress: Stress Concern Present (04/10/2024)   [employer] of Occupational Health - Occupational Stress Questionnaire    Feeling of Stress : Rather much  Social Connections: Socially Isolated (04/10/2024)   Social Connection and Isolation Panel     Frequency of Communication with Friends and Family: Once a week    Frequency of Social Gatherings with Friends and Family: Never    Attends Religious Services: Never    Database administrator or Organizations: No    Attends Engineer, structural: Never    Marital Status: Married   Family History: Family History  Problem Relation Age of Onset   Hyperlipidemia Mother    Hypertension Mother    Cancer Mother        glioblastoma   Sudden death Neg Hx    Heart attack Neg Hx    Diabetes Neg Hx    Colon cancer Neg Hx    Prostate cancer Neg Hx    Colon polyps Neg Hx    Esophageal cancer Neg Hx    Rectal cancer Neg Hx    Stomach cancer Neg Hx    Allergies: Allergies  Allergen Reactions   Amlodipine  Besylate Swelling    Suspected angioedema of bowel   Cepacol [Benzocaine-Menthol] Anaphylaxis   Morphine Anaphylaxis and Other (See Comments)    Resp distress, rash, tachycardia   Vibramycin [Doxycycline] Nausea And Vomiting   Celebrex [Celecoxib] Other (See Comments)    Elevated LFT's   Wellbutrin  [Bupropion ] Hives   Medications: See med rec.  Review of Systems: No headache, visual changes, nausea, vomiting, diarrhea, constipation, dizziness, abdominal pain, skin rash, fevers, chills, night sweats, swollen lymph nodes, weight loss, chest pain, body aches, joint swelling, muscle aches, shortness of breath, mood changes, visual or auditory hallucinations.  Objective:    BP (!) 164/95 (BP Location: Right Arm, Patient Position: Sitting, Cuff Size: Normal)   Pulse (!) 55   Ht 5' 8 (1.727 m)   Wt 190 lb (86.2 kg)   SpO2 96%   BMI 28.89 kg/m   General: Well Developed, well nourished, and in no acute distress.  Neuro: Alert and oriented x3, extra-ocular muscles intact, sensation grossly intact. Cranial nerves II through XII are intact, motor, sensory, and coordinative functions are all intact. HEENT: Normocephalic, atraumatic, pupils equal round reactive to light, neck  supple, no masses, no lymphadenopathy, thyroid  nonpalpable. Oropharynx, nasopharynx, external ear canals are unremarkable. Skin: Warm and dry, no rashes noted.  Cardiac: Regular rate and rhythm, no murmurs rubs or gallops.  Respiratory: Clear to auscultation bilaterally. Not using accessory muscles, speaking in full sentences.  Abdominal: Soft, nontender, nondistended, positive bowel sounds, no masses, no organomegaly.  Musculoskeletal: Shoulder, elbow, wrist, hip, knee, ankle stable, and with full range of motion.  Impression and Recommendations:    Wellness examination Assessment & Plan: Routine HCM labs ordered. HCM reviewed/discussed. Anticipatory guidance regarding healthy weight, lifestyle and choices given. Recommend healthy diet.  Recommend approximately 150 minutes/week of moderate intensity exercise Recommend regular dental and vision exams Always  use seatbelt/lap and shoulder restraints Recommend using smoke alarms and checking batteries at least twice a year Recommend using sunscreen when outside Discussed colon cancer screening recommendations, options.  Referral for colonoscopy placed Discussed recommendations for shingles vaccine.  Patient will look into this Discussed immunization recommendations For pneumococcal vaccine - has received 13 and 23 previously, based on history, recommend PCV21  Orders: -     CBC with Differential/Platelet -     Comprehensive metabolic panel with GFR -     Hemoglobin A1c -     Lipid panel -     TSH Rfx on Abnormal to Free T4  Return in about 6 months (around 10/15/2024) for hypertension.   ___________________________________________ Sincere Berlanga de Peru, MD, ABFM, CAQSM Primary Care and Sports Medicine [facility]

## 2024-04-16 ENCOUNTER — Ambulatory Visit (HOSPITAL_BASED_OUTPATIENT_CLINIC_OR_DEPARTMENT_OTHER): Payer: Self-pay | Admitting: Family Medicine

## 2024-04-16 LAB — HEMOGLOBIN A1C
Est. average glucose Bld gHb Est-mCnc: 103 mg/dL
Hgb A1c MFr Bld: 5.2 % (ref 4.8–5.6)

## 2024-04-16 LAB — COMPREHENSIVE METABOLIC PANEL WITH GFR
ALT: 13 IU/L (ref 0–44)
AST: 16 IU/L (ref 0–40)
Albumin: 4.6 g/dL (ref 3.9–4.9)
Alkaline Phosphatase: 83 IU/L (ref 44–121)
BUN/Creatinine Ratio: 8 — ABNORMAL LOW (ref 10–24)
BUN: 10 mg/dL (ref 8–27)
Bilirubin Total: 1 mg/dL (ref 0.0–1.2)
CO2: 22 mmol/L (ref 20–29)
Calcium: 9.1 mg/dL (ref 8.6–10.2)
Chloride: 102 mmol/L (ref 96–106)
Creatinine, Ser: 1.33 mg/dL — ABNORMAL HIGH (ref 0.76–1.27)
Globulin, Total: 2.6 g/dL (ref 1.5–4.5)
Glucose: 90 mg/dL (ref 70–99)
Potassium: 4.5 mmol/L (ref 3.5–5.2)
Sodium: 142 mmol/L (ref 134–144)
Total Protein: 7.2 g/dL (ref 6.0–8.5)
eGFR: 59 mL/min/{1.73_m2} — ABNORMAL LOW (ref >59)

## 2024-04-16 LAB — LIPID PANEL
Chol/HDL Ratio: 3 ratio (ref 0.0–5.0)
Cholesterol, Total: 104 mg/dL (ref 100–199)
HDL: 35 mg/dL — ABNORMAL LOW (ref >39)
LDL Chol Calc (NIH): 51 mg/dL (ref 0–99)
Triglycerides: 89 mg/dL (ref 0–149)
VLDL Cholesterol Cal: 18 mg/dL (ref 5–40)

## 2024-04-16 LAB — CBC WITH DIFFERENTIAL/PLATELET
Basophils Absolute: 0 10*3/uL (ref 0.0–0.2)
Basos: 0 %
EOS (ABSOLUTE): 0 10*3/uL (ref 0.0–0.4)
Eos: 1 %
Hematocrit: 49.8 % (ref 37.5–51.0)
Hemoglobin: 16.8 g/dL (ref 13.0–17.7)
Immature Grans (Abs): 0 10*3/uL (ref 0.0–0.1)
Immature Granulocytes: 0 %
Lymphocytes Absolute: 1.7 10*3/uL (ref 0.7–3.1)
Lymphs: 29 %
MCH: 33.3 pg — ABNORMAL HIGH (ref 26.6–33.0)
MCHC: 33.7 g/dL (ref 31.5–35.7)
MCV: 99 fL — ABNORMAL HIGH (ref 79–97)
Monocytes Absolute: 0.4 10*3/uL (ref 0.1–0.9)
Monocytes: 7 %
Neutrophils Absolute: 3.8 10*3/uL (ref 1.4–7.0)
Neutrophils: 63 %
Platelets: 145 10*3/uL — ABNORMAL LOW (ref 150–450)
RBC: 5.04 x10E6/uL (ref 4.14–5.80)
RDW: 15.2 % (ref 11.6–15.4)
WBC: 6 10*3/uL (ref 3.4–10.8)

## 2024-04-16 LAB — TSH RFX ON ABNORMAL TO FREE T4: TSH: 3.8 u[IU]/mL (ref 0.450–4.500)

## 2024-04-18 ENCOUNTER — Other Ambulatory Visit (HOSPITAL_BASED_OUTPATIENT_CLINIC_OR_DEPARTMENT_OTHER): Payer: Self-pay

## 2024-04-18 ENCOUNTER — Other Ambulatory Visit (HOSPITAL_BASED_OUTPATIENT_CLINIC_OR_DEPARTMENT_OTHER): Payer: Self-pay | Admitting: Family Medicine

## 2024-04-18 ENCOUNTER — Other Ambulatory Visit: Payer: Self-pay

## 2024-04-18 DIAGNOSIS — I10 Essential (primary) hypertension: Secondary | ICD-10-CM

## 2024-04-18 MED ORDER — LOSARTAN POTASSIUM 100 MG PO TABS
100.0000 mg | ORAL_TABLET | Freq: Every day | ORAL | 1 refills | Status: DC
  Filled 2024-04-18: qty 30, 30d supply, fill #0
  Filled 2024-05-15: qty 30, 30d supply, fill #1

## 2024-04-19 ENCOUNTER — Encounter: Payer: Self-pay | Admitting: Behavioral Health

## 2024-04-19 ENCOUNTER — Ambulatory Visit (INDEPENDENT_AMBULATORY_CARE_PROVIDER_SITE_OTHER): Admitting: Behavioral Health

## 2024-04-19 DIAGNOSIS — F4310 Post-traumatic stress disorder, unspecified: Secondary | ICD-10-CM

## 2024-04-19 DIAGNOSIS — F411 Generalized anxiety disorder: Secondary | ICD-10-CM | POA: Diagnosis not present

## 2024-04-19 NOTE — Progress Notes (Signed)
 [facility] Counselor/Therapist Progress Note  Patient ID: Bruce Woods, MRN: [id number],    Date: 04/19/2024  Time Spent:  2;01 pm to 2:56 pm, 55 minutes This session was held via video teletherapy. The patient consented to the video teletherapy and was located in his car during this session. He is aware it is the responsibility of the patient to secure confidentiality on his end of the session. The provider was in a private home office for the duration of this session.    Treatment Type: Individual Therapy  Reported Symptoms: Stress, anxiety  Mental Status Exam: Appearance:  Well Groomed     Behavior: Appropriate  Motor: Normal  Speech/Language:  Normal Rate  Affect: Appropriate  Mood: anxious  Thought process: normal  Thought content:   WNL  Sensory/Perceptual disturbances:   WNL  Orientation: oriented to person, place, time/date, situation, day of week, and month of year  Attention: Good  Concentration: Good  Memory: WNL  Fund of knowledge:  Good  Insight:   Good  Judgment:  Good  Impulse Control: Good   Risk Assessment: Danger to Self:  No Self-injurious Behavior: No Danger to Others: No Duty to Warn:no Physical Aggression / Violence:No  Access to Firearms a concern: No  Gang Involvement:No   Subjective: A conversation the pt. Overheard at work triggered a flood of emotions for him. He has processed a lot but indicated it upset him and made him realize that he feels he is drifting. We looked at his identity and how he sees himself in all areas of his life. He is feeling that in some areas he has no direction. He wants to move forward so in the next session we will begin to use gradual reduction therapy to help him be less anxious/guarded in various situations He does contract for safety having no thoughts of hurting himself or anyone else. Interventions: Cognitive Behavioral Therapy and Dialectical Behavioral Therapy  Diagnosis: Generalized anxiety  disorder with panic attacks  Plan: I will meet with the patient every 2 to 3 weeks via care agility.  Treatment plan: We will use cognitive behavioral therapy as well as elements of dialectical behavior therapy and person centered therapy.  Goals will be to process the core conflicts contributing to current anxiety and panic, identify causes for anxiety and explore ways to lower it while improving his ability to manage anxiety symptoms and better handle stress.  We will also use cognitive behavioral therapy reframing to help him manage thoughts and worrisome thinking's contributing to feelings of anxiety.  Interventions will include providing education about anxiety, facilitate problem solution skills to help him identify and implement options for resolving stress, teach coping skills for managing anxiety, use cognitive behavioral therapy to identify and change anxiety provoking thoughts especially related to history and current social anxieties, as well as teach dialectical behavior therapy distress tolerance and mindfulness skills.  Goals are to reduce anxiety by 50% with a target date of October 31, 2023.  Progress 35% We reviewed goals as stated above and the pt. Agrees to continue them with a new target date of April 29, 2024. Cecile Coder, [facility]                  Cecile Coder, [facility]               Cecile Coder, [facility]               Cecile Coder, [facility]  Cecile Coder, [facility]               Cecile Coder, [facility]

## 2024-05-02 ENCOUNTER — Other Ambulatory Visit: Payer: Self-pay

## 2024-05-02 ENCOUNTER — Encounter (INDEPENDENT_AMBULATORY_CARE_PROVIDER_SITE_OTHER): Payer: Self-pay

## 2024-05-02 NOTE — Progress Notes (Signed)
 Specialty Pharmacy Refill Coordination Note  ALGER KERSTEIN is a 63 y.o. male contacted today regarding refills of specialty medication(s) Bictegravir-Emtricitab-Tenofov (Biktarvy )   Patient requested (Patient-Rptd) Delivery   Delivery date: 05/07/24   Verified address: (Patient-Rptd) [address],  [facility] [place] [address]   Medication will be filled on 05/06/24.

## 2024-05-06 ENCOUNTER — Other Ambulatory Visit: Payer: Self-pay

## 2024-05-06 ENCOUNTER — Other Ambulatory Visit (HOSPITAL_BASED_OUTPATIENT_CLINIC_OR_DEPARTMENT_OTHER): Payer: Self-pay

## 2024-05-15 ENCOUNTER — Other Ambulatory Visit (HOSPITAL_BASED_OUTPATIENT_CLINIC_OR_DEPARTMENT_OTHER): Payer: Self-pay

## 2024-05-17 ENCOUNTER — Ambulatory Visit: Admitting: Behavioral Health

## 2024-05-17 ENCOUNTER — Other Ambulatory Visit (HOSPITAL_BASED_OUTPATIENT_CLINIC_OR_DEPARTMENT_OTHER): Payer: Self-pay

## 2024-05-20 ENCOUNTER — Encounter (INDEPENDENT_AMBULATORY_CARE_PROVIDER_SITE_OTHER): Payer: Self-pay

## 2024-05-20 ENCOUNTER — Other Ambulatory Visit: Payer: Self-pay

## 2024-05-21 ENCOUNTER — Other Ambulatory Visit: Payer: Self-pay

## 2024-05-21 NOTE — Progress Notes (Signed)
 Specialty Pharmacy Refill Coordination Note  Bruce Woods is a 63 y.o. male contacted today regarding refills of specialty medication(s) Bictegravir-Emtricitab-Tenofov (Biktarvy )   Patient requested (Patient-Rptd) Delivery   Delivery date: 05/27/24   Verified address: (Patient-Rptd) [address],  [place],  [phone]   Medication will be filled on 07.25.25.

## 2024-05-22 ENCOUNTER — Other Ambulatory Visit (HOSPITAL_BASED_OUTPATIENT_CLINIC_OR_DEPARTMENT_OTHER): Payer: Self-pay | Admitting: Family Medicine

## 2024-05-23 ENCOUNTER — Other Ambulatory Visit (HOSPITAL_BASED_OUTPATIENT_CLINIC_OR_DEPARTMENT_OTHER): Payer: Self-pay

## 2024-05-23 MED ORDER — ZOLPIDEM TARTRATE 5 MG PO TABS
5.0000 mg | ORAL_TABLET | Freq: Every evening | ORAL | 1 refills | Status: DC | PRN
  Filled 2024-05-23: qty 30, 30d supply, fill #0
  Filled 2024-07-02: qty 30, 30d supply, fill #1

## 2024-05-24 ENCOUNTER — Other Ambulatory Visit: Payer: Self-pay

## 2024-06-12 ENCOUNTER — Encounter (INDEPENDENT_AMBULATORY_CARE_PROVIDER_SITE_OTHER): Payer: Self-pay

## 2024-06-12 ENCOUNTER — Other Ambulatory Visit (HOSPITAL_BASED_OUTPATIENT_CLINIC_OR_DEPARTMENT_OTHER): Payer: Self-pay | Admitting: Family Medicine

## 2024-06-12 DIAGNOSIS — I10 Essential (primary) hypertension: Secondary | ICD-10-CM

## 2024-06-13 ENCOUNTER — Other Ambulatory Visit (HOSPITAL_BASED_OUTPATIENT_CLINIC_OR_DEPARTMENT_OTHER): Payer: Self-pay

## 2024-06-13 ENCOUNTER — Other Ambulatory Visit: Payer: Self-pay

## 2024-06-13 ENCOUNTER — Other Ambulatory Visit (HOSPITAL_COMMUNITY): Payer: Self-pay

## 2024-06-13 DIAGNOSIS — M25562 Pain in left knee: Secondary | ICD-10-CM | POA: Diagnosis not present

## 2024-06-13 MED ORDER — LOSARTAN POTASSIUM 100 MG PO TABS
100.0000 mg | ORAL_TABLET | Freq: Every day | ORAL | 3 refills | Status: AC
  Filled 2024-06-13: qty 30, 30d supply, fill #0
  Filled 2024-07-12: qty 30, 30d supply, fill #1
  Filled 2024-08-09: qty 30, 30d supply, fill #2
  Filled 2024-09-07: qty 30, 30d supply, fill #3
  Filled 2024-10-06: qty 30, 30d supply, fill #4
  Filled 2024-11-05: qty 30, 30d supply, fill #5
  Filled 2024-12-03: qty 30, 30d supply, fill #6
  Filled 2024-12-03: qty 90, 90d supply, fill #0

## 2024-06-13 NOTE — Progress Notes (Signed)
 Specialty Pharmacy Refill Coordination Note  MyChart Questionnaire Submission  Bruce Woods is a 63 y.o. male contacted today regarding refills of specialty medication(s) Biktarvy.  Doses on hand: (Patient-Rptd) 26   Patient requested: (Patient-Rptd) Delivery   Delivery date: 06/18/24  Verified address: [address] [place] [phone]  Medication will be filled on 06/17/24.

## 2024-06-17 ENCOUNTER — Other Ambulatory Visit: Payer: Self-pay

## 2024-06-17 ENCOUNTER — Other Ambulatory Visit (HOSPITAL_BASED_OUTPATIENT_CLINIC_OR_DEPARTMENT_OTHER): Payer: Self-pay | Admitting: Orthopedic Surgery

## 2024-06-17 ENCOUNTER — Other Ambulatory Visit (HOSPITAL_BASED_OUTPATIENT_CLINIC_OR_DEPARTMENT_OTHER): Payer: Self-pay

## 2024-06-17 DIAGNOSIS — M25462 Effusion, left knee: Secondary | ICD-10-CM | POA: Diagnosis not present

## 2024-06-17 DIAGNOSIS — M25562 Pain in left knee: Secondary | ICD-10-CM | POA: Diagnosis not present

## 2024-06-17 MED ORDER — MELOXICAM 15 MG PO TABS
15.0000 mg | ORAL_TABLET | Freq: Every day | ORAL | 3 refills | Status: DC
  Filled 2024-06-17 (×2): qty 30, 30d supply, fill #0

## 2024-06-17 MED ORDER — TRAMADOL HCL 50 MG PO TABS
50.0000 mg | ORAL_TABLET | Freq: Four times a day (QID) | ORAL | 0 refills | Status: DC | PRN
  Filled 2024-06-17 (×2): qty 20, 5d supply, fill #0

## 2024-06-20 ENCOUNTER — Ambulatory Visit (HOSPITAL_COMMUNITY)
Admission: RE | Admit: 2024-06-20 | Discharge: 2024-06-20 | Disposition: A | Discharge status: Home / Self Care | Source: Ambulatory Visit | Attending: Orthopedic Surgery | Admitting: Orthopedic Surgery

## 2024-06-20 DIAGNOSIS — M25569 Pain in unspecified knee: Secondary | ICD-10-CM | POA: Diagnosis not present

## 2024-06-20 DIAGNOSIS — S8992XA Unspecified injury of left lower leg, initial encounter: Secondary | ICD-10-CM | POA: Diagnosis not present

## 2024-06-20 DIAGNOSIS — M25469 Effusion, unspecified knee: Secondary | ICD-10-CM | POA: Diagnosis not present

## 2024-06-20 DIAGNOSIS — M25562 Pain in left knee: Secondary | ICD-10-CM | POA: Insufficient documentation

## 2024-06-20 DIAGNOSIS — M94269 Chondromalacia, unspecified knee: Secondary | ICD-10-CM | POA: Diagnosis not present

## 2024-06-23 ENCOUNTER — Ambulatory Visit (HOSPITAL_BASED_OUTPATIENT_CLINIC_OR_DEPARTMENT_OTHER)

## 2024-06-25 DIAGNOSIS — M25462 Effusion, left knee: Secondary | ICD-10-CM | POA: Diagnosis not present

## 2024-06-25 DIAGNOSIS — M25562 Pain in left knee: Secondary | ICD-10-CM | POA: Diagnosis not present

## 2024-07-02 ENCOUNTER — Other Ambulatory Visit (HOSPITAL_BASED_OUTPATIENT_CLINIC_OR_DEPARTMENT_OTHER): Payer: Self-pay

## 2024-07-08 ENCOUNTER — Other Ambulatory Visit (HOSPITAL_BASED_OUTPATIENT_CLINIC_OR_DEPARTMENT_OTHER): Payer: Self-pay

## 2024-07-09 ENCOUNTER — Other Ambulatory Visit (HOSPITAL_BASED_OUTPATIENT_CLINIC_OR_DEPARTMENT_OTHER): Payer: Self-pay

## 2024-07-09 MED ORDER — TESTOSTERONE CYPIONATE 200 MG/ML IM SOLN
100.0000 mg | INTRAMUSCULAR | 1 refills | Status: AC
  Filled 2024-07-09 – 2024-08-08 (×3): qty 10, 70d supply, fill #0
  Filled 2024-12-03: qty 10, 70d supply, fill #1

## 2024-07-10 ENCOUNTER — Other Ambulatory Visit: Payer: Self-pay

## 2024-07-10 ENCOUNTER — Other Ambulatory Visit (HOSPITAL_BASED_OUTPATIENT_CLINIC_OR_DEPARTMENT_OTHER): Payer: Self-pay

## 2024-07-10 NOTE — Progress Notes (Signed)
 Specialty Pharmacy Ongoing Clinical Assessment Note  [name] is a 63 y.o. male who is being followed by the specialty pharmacy service for RxSp HIV   Patient's specialty medication(s) reviewed today: Bictegravir-Emtricitab-Tenofov (Biktarvy )   Missed doses in the last 4 weeks: 0   Patient/Caregiver did not have any additional questions or concerns.   Therapeutic benefit summary: Patient is achieving benefit   Adverse events/side effects summary: No adverse events/side effects   Patient's therapy is appropriate to: Continue    Goals Addressed             This Visit's Progress    Achieve Undetectable HIV Viral Load < 20   On track    Patient is on track. Patient will maintain adherence.  Patient's viral load remains undetectable.          Follow up: 1 year  Powell CHRISTELLA Gallus Specialty Pharmacist

## 2024-07-11 ENCOUNTER — Other Ambulatory Visit (HOSPITAL_BASED_OUTPATIENT_CLINIC_OR_DEPARTMENT_OTHER): Payer: Self-pay

## 2024-07-12 ENCOUNTER — Other Ambulatory Visit (HOSPITAL_BASED_OUTPATIENT_CLINIC_OR_DEPARTMENT_OTHER): Payer: Self-pay

## 2024-07-15 ENCOUNTER — Other Ambulatory Visit (HOSPITAL_BASED_OUTPATIENT_CLINIC_OR_DEPARTMENT_OTHER): Payer: Self-pay

## 2024-07-16 ENCOUNTER — Other Ambulatory Visit (HOSPITAL_BASED_OUTPATIENT_CLINIC_OR_DEPARTMENT_OTHER): Payer: Self-pay

## 2024-07-16 ENCOUNTER — Other Ambulatory Visit: Payer: Self-pay

## 2024-07-17 ENCOUNTER — Other Ambulatory Visit (HOSPITAL_BASED_OUTPATIENT_CLINIC_OR_DEPARTMENT_OTHER): Payer: Self-pay

## 2024-07-18 ENCOUNTER — Other Ambulatory Visit (HOSPITAL_BASED_OUTPATIENT_CLINIC_OR_DEPARTMENT_OTHER): Payer: Self-pay

## 2024-07-18 DIAGNOSIS — E291 Testicular hypofunction: Secondary | ICD-10-CM | POA: Diagnosis not present

## 2024-07-18 DIAGNOSIS — N529 Male erectile dysfunction, unspecified: Secondary | ICD-10-CM | POA: Diagnosis not present

## 2024-07-19 ENCOUNTER — Other Ambulatory Visit (HOSPITAL_BASED_OUTPATIENT_CLINIC_OR_DEPARTMENT_OTHER): Payer: Self-pay

## 2024-07-19 ENCOUNTER — Other Ambulatory Visit: Payer: Self-pay

## 2024-07-22 ENCOUNTER — Other Ambulatory Visit (HOSPITAL_BASED_OUTPATIENT_CLINIC_OR_DEPARTMENT_OTHER): Payer: Self-pay

## 2024-07-23 ENCOUNTER — Other Ambulatory Visit (HOSPITAL_BASED_OUTPATIENT_CLINIC_OR_DEPARTMENT_OTHER): Payer: Self-pay

## 2024-07-24 ENCOUNTER — Other Ambulatory Visit (HOSPITAL_BASED_OUTPATIENT_CLINIC_OR_DEPARTMENT_OTHER): Payer: Self-pay

## 2024-07-25 ENCOUNTER — Other Ambulatory Visit (HOSPITAL_BASED_OUTPATIENT_CLINIC_OR_DEPARTMENT_OTHER): Payer: Self-pay

## 2024-07-26 ENCOUNTER — Other Ambulatory Visit (HOSPITAL_BASED_OUTPATIENT_CLINIC_OR_DEPARTMENT_OTHER): Payer: Self-pay

## 2024-07-29 ENCOUNTER — Other Ambulatory Visit: Payer: Self-pay

## 2024-07-29 ENCOUNTER — Encounter (INDEPENDENT_AMBULATORY_CARE_PROVIDER_SITE_OTHER): Payer: Self-pay

## 2024-07-29 ENCOUNTER — Other Ambulatory Visit (HOSPITAL_BASED_OUTPATIENT_CLINIC_OR_DEPARTMENT_OTHER): Payer: Self-pay

## 2024-07-29 ENCOUNTER — Other Ambulatory Visit: Payer: Self-pay | Admitting: Pharmacy Technician

## 2024-07-29 NOTE — Progress Notes (Signed)
 Specialty Pharmacy Refill Coordination Note  Bruce Woods is a 63 y.o. male contacted today regarding refills of specialty medication(s) Bictegravir-Emtricitab-Tenofov (Biktarvy )   Patient requested (Patient-Rptd) Delivery   Delivery date: 08/02/24 Verified address: (Patient-Rptd) [address] [place] [address]   Medication will be filled on 08/01/24.

## 2024-07-30 ENCOUNTER — Other Ambulatory Visit (HOSPITAL_BASED_OUTPATIENT_CLINIC_OR_DEPARTMENT_OTHER): Payer: Self-pay

## 2024-07-31 ENCOUNTER — Other Ambulatory Visit: Payer: Commercial Managed Care - PPO

## 2024-07-31 ENCOUNTER — Other Ambulatory Visit: Payer: Self-pay

## 2024-07-31 ENCOUNTER — Other Ambulatory Visit (HOSPITAL_BASED_OUTPATIENT_CLINIC_OR_DEPARTMENT_OTHER): Payer: Self-pay

## 2024-07-31 DIAGNOSIS — B20 Human immunodeficiency virus [HIV] disease: Secondary | ICD-10-CM

## 2024-07-31 LAB — T-HELPER CELL (CD4) - (RCID CLINIC ONLY)
CD4 % Helper T Cell: 21 % — ABNORMAL LOW (ref 33–65)
CD4 T Cell Abs: 312 /uL — ABNORMAL LOW (ref 400–1790)

## 2024-08-01 ENCOUNTER — Other Ambulatory Visit (HOSPITAL_BASED_OUTPATIENT_CLINIC_OR_DEPARTMENT_OTHER): Payer: Self-pay

## 2024-08-02 ENCOUNTER — Other Ambulatory Visit (HOSPITAL_BASED_OUTPATIENT_CLINIC_OR_DEPARTMENT_OTHER): Payer: Self-pay

## 2024-08-02 LAB — CBC WITH DIFFERENTIAL/PLATELET
Absolute Lymphocytes: 1573 {cells}/uL (ref 850–3900)
Absolute Monocytes: 336 {cells}/uL (ref 200–950)
Basophils Absolute: 11 {cells}/uL (ref 0–200)
Basophils Relative: 0.2 %
Eosinophils Absolute: 50 {cells}/uL (ref 15–500)
Eosinophils Relative: 0.9 %
HCT: 50.8 % — ABNORMAL HIGH (ref 38.5–50.0)
Hemoglobin: 17.5 g/dL — ABNORMAL HIGH (ref 13.2–17.1)
MCH: 34.1 pg — ABNORMAL HIGH (ref 27.0–33.0)
MCHC: 34.4 g/dL (ref 32.0–36.0)
MCV: 99 fL (ref 80.0–100.0)
MPV: 10.9 fL (ref 7.5–12.5)
Monocytes Relative: 6.1 %
Neutro Abs: 3531 {cells}/uL (ref 1500–7800)
Neutrophils Relative %: 64.2 %
Platelets: 155 Thousand/uL (ref 140–400)
RBC: 5.13 Million/uL (ref 4.20–5.80)
RDW: 13.6 % (ref 11.0–15.0)
Total Lymphocyte: 28.6 %
WBC: 5.5 Thousand/uL (ref 3.8–10.8)

## 2024-08-02 LAB — LIPID PANEL
Cholesterol: 106 mg/dL (ref <200)
HDL: 40 mg/dL (ref >=40)
LDL Cholesterol (Calc): 48 mg/dL
Non-HDL Cholesterol (Calc): 66 mg/dL (ref <130)
Total CHOL/HDL Ratio: 2.7 (calc) (ref <5.0)
Triglycerides: 96 mg/dL (ref <150)

## 2024-08-02 LAB — RPR: RPR Ser Ql: NONREACTIVE

## 2024-08-02 LAB — COMPLETE METABOLIC PANEL WITHOUT GFR
AG Ratio: 1.9 (calc) (ref 1.0–2.5)
ALT: 13 U/L (ref 9–46)
AST: 15 U/L (ref 10–35)
Albumin: 5 g/dL (ref 3.6–5.1)
Alkaline phosphatase (APISO): 67 U/L (ref 35–144)
BUN: 12 mg/dL (ref 7–25)
CO2: 32 mmol/L (ref 20–32)
Calcium: 9.4 mg/dL (ref 8.6–10.3)
Chloride: 102 mmol/L (ref 98–110)
Creat: 1.22 mg/dL (ref 0.70–1.35)
Globulin: 2.7 g/dL (ref 1.9–3.7)
Glucose, Bld: 92 mg/dL (ref 65–99)
Potassium: 4.2 mmol/L (ref 3.5–5.3)
Sodium: 143 mmol/L (ref 135–146)
Total Bilirubin: 1.3 mg/dL — ABNORMAL HIGH (ref 0.2–1.2)
Total Protein: 7.7 g/dL (ref 6.1–8.1)

## 2024-08-02 LAB — HIV-1 RNA QUANT-NO REFLEX-BLD
HIV 1 RNA Quant: NOT DETECTED {copies}/mL
HIV-1 RNA Quant, Log: NOT DETECTED {Log_copies}/mL

## 2024-08-02 LAB — HEPATITIS C ANTIBODY: Hepatitis C Ab: NONREACTIVE

## 2024-08-04 ENCOUNTER — Other Ambulatory Visit (HOSPITAL_BASED_OUTPATIENT_CLINIC_OR_DEPARTMENT_OTHER): Payer: Self-pay

## 2024-08-05 ENCOUNTER — Other Ambulatory Visit (HOSPITAL_BASED_OUTPATIENT_CLINIC_OR_DEPARTMENT_OTHER): Payer: Self-pay

## 2024-08-06 ENCOUNTER — Other Ambulatory Visit (HOSPITAL_BASED_OUTPATIENT_CLINIC_OR_DEPARTMENT_OTHER): Payer: Self-pay

## 2024-08-06 DIAGNOSIS — S335XXA Sprain of ligaments of lumbar spine, initial encounter: Secondary | ICD-10-CM | POA: Diagnosis not present

## 2024-08-06 DIAGNOSIS — M5431 Sciatica, right side: Secondary | ICD-10-CM | POA: Diagnosis not present

## 2024-08-07 ENCOUNTER — Other Ambulatory Visit (HOSPITAL_BASED_OUTPATIENT_CLINIC_OR_DEPARTMENT_OTHER): Payer: Self-pay

## 2024-08-07 DIAGNOSIS — M5431 Sciatica, right side: Secondary | ICD-10-CM | POA: Diagnosis not present

## 2024-08-07 DIAGNOSIS — S335XXA Sprain of ligaments of lumbar spine, initial encounter: Secondary | ICD-10-CM | POA: Diagnosis not present

## 2024-08-08 ENCOUNTER — Other Ambulatory Visit (HOSPITAL_BASED_OUTPATIENT_CLINIC_OR_DEPARTMENT_OTHER): Payer: Self-pay

## 2024-08-08 DIAGNOSIS — H5203 Hypermetropia, bilateral: Secondary | ICD-10-CM | POA: Diagnosis not present

## 2024-08-09 ENCOUNTER — Other Ambulatory Visit (HOSPITAL_BASED_OUTPATIENT_CLINIC_OR_DEPARTMENT_OTHER): Payer: Self-pay | Admitting: Family Medicine

## 2024-08-09 ENCOUNTER — Other Ambulatory Visit (HOSPITAL_BASED_OUTPATIENT_CLINIC_OR_DEPARTMENT_OTHER): Payer: Self-pay

## 2024-08-09 ENCOUNTER — Other Ambulatory Visit: Payer: Self-pay

## 2024-08-10 ENCOUNTER — Other Ambulatory Visit (HOSPITAL_BASED_OUTPATIENT_CLINIC_OR_DEPARTMENT_OTHER): Payer: Self-pay

## 2024-08-12 ENCOUNTER — Other Ambulatory Visit (HOSPITAL_BASED_OUTPATIENT_CLINIC_OR_DEPARTMENT_OTHER): Payer: Self-pay

## 2024-08-12 ENCOUNTER — Other Ambulatory Visit: Payer: Self-pay

## 2024-08-12 ENCOUNTER — Other Ambulatory Visit (HOSPITAL_COMMUNITY): Payer: Self-pay

## 2024-08-12 MED ORDER — TADALAFIL 5 MG PO TABS
5.0000 mg | ORAL_TABLET | Freq: Every day | ORAL | 3 refills | Status: AC
  Filled 2024-08-12 – 2024-08-21 (×3): qty 90, 90d supply, fill #0
  Filled 2024-11-25: qty 90, 90d supply, fill #1

## 2024-08-12 MED ORDER — ROSUVASTATIN CALCIUM 20 MG PO TABS
20.0000 mg | ORAL_TABLET | Freq: Every day | ORAL | 1 refills | Status: AC
  Filled 2024-08-12 – 2024-09-30 (×2): qty 90, 90d supply, fill #0
  Filled 2024-12-03: qty 90, 90d supply, fill #1

## 2024-08-12 MED ORDER — ASPIRIN 81 MG PO TBEC
81.0000 mg | DELAYED_RELEASE_TABLET | Freq: Every day | ORAL | 1 refills | Status: DC
  Filled 2024-08-12: qty 90, 90d supply, fill #0
  Filled 2024-11-06: qty 90, 90d supply, fill #1

## 2024-08-13 ENCOUNTER — Other Ambulatory Visit: Payer: Self-pay

## 2024-08-13 ENCOUNTER — Encounter: Payer: Self-pay | Admitting: Pharmacist

## 2024-08-13 NOTE — Progress Notes (Unsigned)
 Subjective:  Chief complaint: follow-up for HIV disease on medications   Patient ID: Bruce Woods, male    DOB: [birth year], 63 y.o.   MRN: [id number]  HPI  Discussed the use of AI scribe software for clinical note transcription with the patient, who gave verbal consent to proceed.  History of Present Illness   Bruce Woods is a 63 year old male with HIV who presents for routine follow-up.  He has an undetectable viral load and a CD4 count of 312, which has varied between 300 and 600 in the past. He experiences anxiety while waiting for his lab results but is pleased with the current outcomes. He adheres to his medication regimen, taking Biktarvy  daily without missing doses.  He is currently on several medications including aspirin  81 mg daily, Celexa  for depression, losartan  for blood pressure, Crestor  for cholesterol, Cialis  as part of his testosterone  regimen, and Ambien  5 mg nightly for sleep. He reports difficulty sleeping without Ambien , stating 'I cannot go to sleep without it.' He occasionally skips Ambien  when he is off work and can afford to sleep late.  He recently experienced a fall six weeks ago, resulting in a left knee injury for which he was prescribed tramadol  for pain management. The knee was drained by Dr. Cherylene, which alleviated the pain.  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 He does not smoke and denies having heart disease. He mentions a slightly elevated bilirubin level, which he has had in the past.      , Past Medical History:  Diagnosis Date   Allergy    Angio-edema suspected of small bowel question due to amlodipine  06/02/2022   Anxiety    Complication of anesthesia 07/28/2022   oxygen desaturations to 60's during EGD, bronchospasm after scope removed, reported being told he has suspected undiagnosed OSA   COVID-19 07/2021   DEPRESSION    Gastritis and gastroduodenitis    GERD (gastroesophageal reflux  disease)    History of hiatal hernia    HIV DISEASE    Hyperlipidemia 10/01/2023   Hypertension    IBS (irritable bowel syndrome)    hx of   Sarcoidosis 2000   question of   Sleep apnea    TIA (transient ischemic attack) 12/2022   occipital TIA-NO residual effects   Ulnar nerve compression, right     Past Surgical History:  Procedure Laterality Date   COLONOSCOPY  2010   [place]-unknown prep(exc)-normal   ESOPHAGOGASTRODUODENOSCOPY  2002   HERNIA REPAIR     LUNG SURGERY  10/31/1998   [facility]   TONSILLECTOMY AND ADENOIDECTOMY     ULNAR NERVE TRANSPOSITION Right 05/17/2018   Procedure: RIGHT ULNAR NERVE DECOMPRESSION/TRANSPOSITION;  Surgeon: Murrell Drivers, MD;  Location: [facility];  Service: Orthopedics;  Laterality: Right;   XI ROBOTIC ASSISTED HIATAL HERNIA REPAIR N/A 09/13/2022   Procedure: XI ROBOTIC ASSISTED HIATAL HERNIA REPAIR WITH FUNDOPLICATION WITH MESH;  Surgeon: Rubin Calamity, MD;  Location: [facility] OR;  Service: General;  Laterality: N/A;    Family History  Problem Relation Age of Onset   Hyperlipidemia Mother    Hypertension Mother    Cancer Mother        glioblastoma   Sudden death Neg Hx    Heart attack Neg Hx    Diabetes Neg Hx    Colon cancer Neg Hx    Prostate cancer Neg Hx    Colon polyps Neg Hx    Esophageal  cancer Neg Hx    Rectal cancer Neg Hx    Stomach cancer Neg Hx       Social History   Socioeconomic History   Marital status: Married    Spouse name: Not on file   Number of children: 2   Years of education: Not on file   Highest education level: Bachelor's degree (e.g., BA, AB, BS)  Occupational History   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: [employer]  Tobacco Use   Smoking status: Former    Current packs/day: 0.00    Average packs/day: 1 pack/day for 2.0 years (2.0 ttl pk-yrs)    Types: Cigarettes    Start date: 10/31/1981    Quit date: 11/01/1983    Years since quitting: 40.8    Passive  exposure: Past   Smokeless tobacco: Never  Vaping Use   Vaping status: Never Used  Substance and Sexual Activity   Alcohol use: Yes    Alcohol/week: 1.0 standard drink of alcohol    Types: 1 Standard drinks or equivalent per week    Comment: Social   Drug use: No   Sexual activity: Not Currently    Birth control/protection: Abstinence    Comment: declined condoms  Other Topics Concern   Not on file  Social History Narrative   Married to [place], daughters Tinnie Smiles   RN PACU, ED   Former smoker no drug use occasional alcohol   Social Drivers of Corporate investment banker Strain: Low Risk  (04/10/2024)   Overall Financial Resource Strain (CARDIA)    Difficulty of Paying Living Expenses: Not hard at all  Food Insecurity: No Food Insecurity (04/10/2024)   Hunger Vital Sign    Worried About Running Out of Food in the Last Year: Never true    Ran Out of Food in the Last Year: Never true  Transportation Needs: No Transportation Needs (04/10/2024)   PRAPARE - Administrator, Civil Service (Medical): No    Lack of Transportation (Non-Medical): No  Physical Activity: Insufficiently Active (04/10/2024)   Exercise Vital Sign    Days of Exercise per Week: 4 days    Minutes of Exercise per Session: 30 min  Stress: Stress Concern Present (04/10/2024)   [employer] of Occupational Health - Occupational Stress Questionnaire    Feeling of Stress : Rather much  Social Connections: Socially Isolated (04/10/2024)   Social Connection and Isolation Panel    Frequency of Communication with Friends and Family: Once a week    Frequency of Social Gatherings with Friends and Family: Never    Attends Religious Services: Never    Database administrator or Organizations: No    Attends Engineer, structural: Never    Marital Status: Married    Allergies  Allergen Reactions   Amlodipine  Besylate Swelling    Suspected angioedema of bowel   Cepacol [Benzocaine-Menthol]  Anaphylaxis   Morphine Anaphylaxis and Other (See Comments)    Resp distress, rash, tachycardia   Vibramycin [Doxycycline] Nausea And Vomiting   Celebrex [Celecoxib] Other (See Comments)    Elevated LFT's   Wellbutrin  [Bupropion ] Hives     Current Outpatient Medications:    anastrozole  (ARIMIDEX ) 1 MG tablet, Take 1 tablet (1 mg total) by mouth once a week., Disp: 20 tablet, Rfl: 1   aspirin  EC (ASPIRIN  LOW DOSE) 81 MG tablet, Take 1 tablet (81 mg total) by mouth daily. Swallow whole., Disp: 90 tablet,  Rfl: 1   bictegravir-emtricitabine -tenofovir  AF (BIKTARVY ) 50-200-25 MG TABS tablet, Take 1 tablet by mouth daily., Disp: 30 tablet, Rfl: 11   citalopram  (CELEXA ) 20 MG tablet, Take 1 tablet (20 mg total) by mouth daily., Disp: 90 tablet, Rfl: 3   losartan  (COZAAR ) 100 MG tablet, Take 1 tablet (100 mg total) by mouth daily., Disp: 90 tablet, Rfl: 3   meloxicam  (MOBIC ) 15 MG tablet, Take 1 tablet (15 mg total) by mouth daily., Disp: 30 tablet, Rfl: 3   rosuvastatin  (CRESTOR ) 20 MG tablet, Take 1 tablet (20 mg total) by mouth daily., Disp: 90 tablet, Rfl: 1   tadalafil  (CIALIS ) 5 MG tablet, Take 1 tablet (5 mg total) by mouth daily., Disp: 90 tablet, Rfl: 3   testosterone  cypionate (DEPOTESTOSTERONE CYPIONATE) 200 MG/ML injection, Inject 0.5 mLs (100 mg total) into the muscle once a week., Disp: 10 mL, Rfl: 1   traMADol  (ULTRAM ) 50 MG tablet, Take 1 tablet (50 mg total) by mouth every 6 (six) hours as needed., Disp: 20 tablet, Rfl: 0   zolpidem  (AMBIEN ) 5 MG tablet, Take 1 tablet (5 mg total) by mouth at bedtime as needed for sleep., Disp: 30 tablet, Rfl: 1   Review of Systems  Constitutional:  Negative for activity change, appetite change, chills, diaphoresis, fatigue, fever and unexpected weight change.  HENT:  Negative for congestion, rhinorrhea, sinus pressure, sneezing, sore throat and trouble swallowing.   Eyes:  Negative for photophobia and visual disturbance.  Respiratory:  Negative  for cough, chest tightness, shortness of breath, wheezing and stridor.   Cardiovascular:  Negative for chest pain, palpitations and leg swelling.  Gastrointestinal:  Negative for abdominal distention, abdominal pain, anal bleeding, blood in stool, constipation, diarrhea, nausea and vomiting.  Genitourinary:  Negative for difficulty urinating, dysuria, flank pain and hematuria.  Musculoskeletal:  Negative for arthralgias, back pain, gait problem, joint swelling and myalgias.  Skin:  Negative for color change, pallor, rash and wound.  Neurological:  Negative for dizziness, tremors, weakness and light-headedness.  Hematological:  Negative for adenopathy. Does not bruise/bleed easily.  Psychiatric/Behavioral:  Negative for agitation, behavioral problems, confusion, decreased concentration, dysphoric mood and sleep disturbance.        Objective:   Physical Exam Constitutional:      Appearance: He is well-developed.  HENT:     Head: Normocephalic and atraumatic.  Eyes:     Conjunctiva/sclera: Conjunctivae normal.  Cardiovascular:     Rate and Rhythm: Normal rate and regular rhythm.  Pulmonary:     Effort: Pulmonary effort is normal. No respiratory distress.     Breath sounds: No wheezing.  Abdominal:     General: There is no distension.     Palpations: Abdomen is soft.  Musculoskeletal:        General: No tenderness. Normal range of motion.     Cervical back: Normal range of motion and neck supple.  Skin:    General: Skin is warm and dry.     Coloration: Skin is not pale.     Findings: No erythema or rash.  Neurological:     General: No focal deficit present.     Mental Status: He is alert and oriented to person, place, and time.  Psychiatric:        Mood and Affect: Mood normal.        Behavior: Behavior normal.        Thought Content: Thought content normal.        Judgment: Judgment normal.  Assessment & Plan:   Assessment and Plan    Human immunodeficiency  virus (HIV) disease HIV well-controlled with Biktarvy . Viral load undetectable, CD4 count stable at 312. Discussed CD4 variability and adherence importance. Reassured about occasional missed doses. - Continue Biktarvy  as prescribed. - Schedule follow-up appointment in 10 months.  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  Discussed potential causes including testosterone  therapy and sleep apnea.   Hyperlipidemia:   continue crestor    ED: continue cialis    Depression continue celexa    Insomnia; continue prn ambien    Vaccine counsling: had flu shot but did not want COVID vaccine     HTN: continue losartan 

## 2024-08-14 ENCOUNTER — Ambulatory Visit: Payer: Self-pay | Admitting: Infectious Disease

## 2024-08-14 ENCOUNTER — Other Ambulatory Visit: Payer: Self-pay

## 2024-08-14 ENCOUNTER — Encounter: Payer: Self-pay | Admitting: Infectious Disease

## 2024-08-14 VITALS — BP 163/79 | HR 58 | Temp 98.1°F | Wt 187.4 lb

## 2024-08-14 DIAGNOSIS — N529 Male erectile dysfunction, unspecified: Secondary | ICD-10-CM

## 2024-08-14 DIAGNOSIS — B20 Human immunodeficiency virus [HIV] disease: Secondary | ICD-10-CM | POA: Diagnosis not present

## 2024-08-14 DIAGNOSIS — L570 Actinic keratosis: Secondary | ICD-10-CM

## 2024-08-14 DIAGNOSIS — E785 Hyperlipidemia, unspecified: Secondary | ICD-10-CM | POA: Diagnosis not present

## 2024-08-14 DIAGNOSIS — I10 Essential (primary) hypertension: Secondary | ICD-10-CM

## 2024-08-14 DIAGNOSIS — Z7185 Encounter for immunization safety counseling: Secondary | ICD-10-CM

## 2024-08-14 DIAGNOSIS — F32A Depression, unspecified: Secondary | ICD-10-CM

## 2024-08-14 DIAGNOSIS — D751 Secondary polycythemia: Secondary | ICD-10-CM | POA: Diagnosis not present

## 2024-08-14 DIAGNOSIS — L821 Other seborrheic keratosis: Secondary | ICD-10-CM

## 2024-08-14 DIAGNOSIS — R7989 Other specified abnormal findings of blood chemistry: Secondary | ICD-10-CM

## 2024-08-14 DIAGNOSIS — F419 Anxiety disorder, unspecified: Secondary | ICD-10-CM

## 2024-08-14 DIAGNOSIS — D869 Sarcoidosis, unspecified: Secondary | ICD-10-CM

## 2024-08-14 MED ORDER — BIKTARVY 50-200-25 MG PO TABS
1.0000 | ORAL_TABLET | Freq: Every day | ORAL | 11 refills | Status: AC
  Filled 2024-08-14 – 2024-08-23 (×2): qty 30, 30d supply, fill #0
  Filled 2024-09-30 – 2024-10-01 (×2): qty 30, 30d supply, fill #1
  Filled 2024-10-25: qty 30, 30d supply, fill #2
  Filled 2024-11-29: qty 30, 30d supply, fill #3

## 2024-08-16 ENCOUNTER — Other Ambulatory Visit: Payer: Self-pay

## 2024-08-21 ENCOUNTER — Other Ambulatory Visit (HOSPITAL_BASED_OUTPATIENT_CLINIC_OR_DEPARTMENT_OTHER): Payer: Self-pay

## 2024-08-21 ENCOUNTER — Other Ambulatory Visit: Payer: Self-pay

## 2024-08-21 ENCOUNTER — Other Ambulatory Visit (HOSPITAL_COMMUNITY): Payer: Self-pay

## 2024-08-21 ENCOUNTER — Encounter: Payer: Self-pay | Admitting: Pharmacist

## 2024-08-21 ENCOUNTER — Ambulatory Visit (HOSPITAL_BASED_OUTPATIENT_CLINIC_OR_DEPARTMENT_OTHER): Admitting: Family Medicine

## 2024-08-23 ENCOUNTER — Other Ambulatory Visit: Payer: Self-pay

## 2024-08-23 NOTE — Progress Notes (Signed)
 Specialty Pharmacy Refill Coordination Note  Bruce Woods is a 63 y.o. male contacted today regarding refills of specialty medication(s) Bictegravir-Emtricitab-Tenofov (Biktarvy )   Patient requested (Patient-Rptd) Delivery   Delivery date: 09/05/24   Verified address: (Patient-Rptd) [address] [name] [address]   Medication will be filled on 09/04/24.

## 2024-09-04 ENCOUNTER — Other Ambulatory Visit (HOSPITAL_COMMUNITY): Payer: Self-pay

## 2024-09-04 ENCOUNTER — Other Ambulatory Visit: Payer: Self-pay

## 2024-09-09 ENCOUNTER — Other Ambulatory Visit (HOSPITAL_BASED_OUTPATIENT_CLINIC_OR_DEPARTMENT_OTHER): Payer: Self-pay

## 2024-09-12 ENCOUNTER — Other Ambulatory Visit (HOSPITAL_BASED_OUTPATIENT_CLINIC_OR_DEPARTMENT_OTHER): Payer: Self-pay | Admitting: Family Medicine

## 2024-09-13 ENCOUNTER — Other Ambulatory Visit: Payer: Self-pay

## 2024-09-13 ENCOUNTER — Other Ambulatory Visit (HOSPITAL_BASED_OUTPATIENT_CLINIC_OR_DEPARTMENT_OTHER): Payer: Self-pay

## 2024-09-13 MED ORDER — ZOLPIDEM TARTRATE 5 MG PO TABS
5.0000 mg | ORAL_TABLET | Freq: Every evening | ORAL | 1 refills | Status: DC | PRN
  Filled 2024-09-13: qty 30, 30d supply, fill #0
  Filled 2024-11-13: qty 30, 30d supply, fill #1

## 2024-09-19 ENCOUNTER — Encounter (HOSPITAL_BASED_OUTPATIENT_CLINIC_OR_DEPARTMENT_OTHER): Payer: Self-pay | Admitting: Family Medicine

## 2024-09-19 ENCOUNTER — Ambulatory Visit (HOSPITAL_BASED_OUTPATIENT_CLINIC_OR_DEPARTMENT_OTHER): Admitting: Family Medicine

## 2024-09-19 VITALS — BP 127/76 | HR 58 | Ht 68.0 in | Wt 190.4 lb

## 2024-09-19 DIAGNOSIS — I10 Essential (primary) hypertension: Secondary | ICD-10-CM | POA: Diagnosis not present

## 2024-09-19 DIAGNOSIS — Z0000 Encounter for general adult medical examination without abnormal findings: Secondary | ICD-10-CM

## 2024-09-19 NOTE — Assessment & Plan Note (Signed)
 Blood pressure is controlled in office today.  He reports that he has had some elevated blood pressure readings at home, however he thinks that these are work-related.  Overall, he has been doing well, denies any issues with current medication regimen, no side effects or issues noted. At this time, we can continue with current medication regimen, no changes made today.  Recommend focusing on lifestyle modifications including dietary modifications and regular aerobic exercise.  If needing to make further medication adjustments, likely would add second agent, likely with low-dose of amlodipine  or addition of chlorthalidone or indapamide

## 2024-09-19 NOTE — Progress Notes (Signed)
    Procedures performed today:    None.  Independent interpretation of notes and tests performed by another provider:   None.  Brief History, Exam, Impression, and Recommendations:    BP 127/76 (BP Location: Right Arm, Patient Position: Sitting, Cuff Size: Normal)   Pulse (!) 58   Ht 5' 8 (1.727 m)   Wt 190 lb 6.4 oz (86.4 kg)   SpO2 96%   BMI 28.95 kg/m   Hypertension, essential Assessment & Plan: Blood pressure is controlled in office today.  He reports that he has had some elevated blood pressure readings at home, however he thinks that these are work-related.  Overall, he has been doing well, denies any issues with current medication regimen, no side effects or issues noted. At this time, we can continue with current medication regimen, no changes made today.  Recommend focusing on lifestyle modifications including dietary modifications and regular aerobic exercise.  If needing to make further medication adjustments, likely would add second agent, likely with low-dose of amlodipine  or addition of chlorthalidone or indapamide   Wellness examination -     CBC with Differential/Platelet; Future -     Comprehensive metabolic panel with GFR; Future -     Hemoglobin A1c; Future -     Lipid panel; Future -     TSH Rfx on Abnormal to Free T4; Future  Return in about 7 months (around 04/19/2025) for CPE with fasting labs 1 week prior.   ___________________________________________ Aleem Elza de [place], MD, ABFM, CAQSM Primary Care and Sports Medicine [facility]

## 2024-09-27 ENCOUNTER — Other Ambulatory Visit (HOSPITAL_COMMUNITY): Payer: Self-pay

## 2024-10-01 ENCOUNTER — Other Ambulatory Visit: Payer: Self-pay

## 2024-10-01 ENCOUNTER — Other Ambulatory Visit: Payer: Self-pay | Admitting: Pharmacy Technician

## 2024-10-01 ENCOUNTER — Other Ambulatory Visit (HOSPITAL_COMMUNITY): Payer: Self-pay

## 2024-10-01 NOTE — Progress Notes (Signed)
 Specialty Pharmacy Refill Coordination Note  Bruce Woods is a 63 y.o. male contacted today regarding refills of specialty medication(s) Bictegravir-Emtricitab-Tenofov (Biktarvy )   Patient requested (Patient-Rptd) Delivery   Delivery date: 10/04/2024 Verified address: (Patient-Rptd) [address] [name] [address]   Medication will be filled on: 10/03/2024

## 2024-10-02 ENCOUNTER — Encounter: Payer: Self-pay | Admitting: Behavioral Health

## 2024-10-02 ENCOUNTER — Ambulatory Visit: Admitting: Behavioral Health

## 2024-10-02 DIAGNOSIS — F4310 Post-traumatic stress disorder, unspecified: Secondary | ICD-10-CM | POA: Diagnosis not present

## 2024-10-02 NOTE — Progress Notes (Signed)
 [facility] Counselor/Therapist Progress Note  Patient ID: Bruce Woods, MRN: [id number],    Date: 10/02/2024  Time Spent: 4:01 PM until 4:57 PM, 56 minutes This session was held via video teletherapy. The patient consented to the video teletherapy and was located in his car during this session. He is aware it is the responsibility of the patient to secure confidentiality on his end of the session. The provider was in a private home office for the duration of this session.    Treatment Type: Individual Therapy  Reported Symptoms: Stress, anxiety  Mental Status Exam: Appearance:  Well Groomed     Behavior: Appropriate  Motor: Normal  Speech/Language:  Normal Rate  Affect: Appropriate  Mood: anxious  Thought process: normal  Thought content:   WNL  Sensory/Perceptual disturbances:   WNL  Orientation: oriented to person, place, time/date, situation, day of week, and month of year  Attention: Good  Concentration: Good  Memory: WNL  Fund of knowledge:  Good  Insight:   Good  Judgment:  Good  Impulse Control: Good   Risk Assessment: Danger to Self:  No Self-injurious Behavior: No Danger to Others: No Duty to Warn:no Physical Aggression / Violence:No  Access to Firearms a concern: No  Gang Involvement:No   Subjective: I had not seen the patient in several months.  He said that he has been a lot of time thinking about retirement and is planning to retire next year.  He will of had 39 years in the same company with a few different positions but knows that he has done a great job.  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  He still loves what he does involve some people he works with but recognizes that he is tired.  As he started slowing down he started thinking more about events that took place when he was in the [employer] and how they have impacted his life.  Since our last session more memories have bubble to the surface and we processed  those memories and how they affect him in his relationships.  We talked about where some of his fears and anxiety come and he has started doing some gradual exposure therapy and we will continue to add to that as a way to push past some of the trauma earlier events in his life. He does contract for safety having no thoughts of hurting himself or anyone else. Interventions: Cognitive Behavioral Therapy and Dialectical Behavioral Therapy  Diagnosis: Generalized anxiety disorder with panic attacks  Plan: I will meet with the patient every 2 to 3 weeks via care agility.  Treatment plan: We will use cognitive behavioral therapy as well as elements of dialectical behavior therapy and person centered therapy.  Goals will be to process the core conflicts contributing to current anxiety and panic, identify causes for anxiety and explore ways to lower it while improving his ability to manage anxiety symptoms and better handle stress.  We will also use cognitive behavioral therapy reframing to help him manage thoughts and worrisome thinking's contributing to feelings of anxiety.  Interventions will include providing education about anxiety, facilitate problem solution skills to help him identify and implement options for resolving stress, teach coping skills for managing anxiety, use cognitive behavioral therapy to identify and change anxiety provoking thoughts especially related to history and current social anxieties, as well as teach dialectical behavior therapy distress tolerance and mindfulness skills.  Goals are to reduce anxiety by 50% with a  target date of October 31, 2023.  Progress 35% We reviewed goals as stated above and the pt. Agrees to continue them with a new target date of April 29, 2024. Lorrene CHRISTELLA Hasten, [facility]                  Lorrene CHRISTELLA Hasten, [facility]               Lorrene CHRISTELLA Hasten, [facility]               Lorrene CHRISTELLA Hasten,  [facility]               Lorrene CHRISTELLA Hasten, [facility]               Lorrene CHRISTELLA Hasten, [facility]               Lorrene CHRISTELLA Hasten, [facility]

## 2024-10-03 ENCOUNTER — Other Ambulatory Visit: Payer: Self-pay

## 2024-10-07 ENCOUNTER — Other Ambulatory Visit (HOSPITAL_BASED_OUTPATIENT_CLINIC_OR_DEPARTMENT_OTHER): Payer: Self-pay

## 2024-10-15 ENCOUNTER — Ambulatory Visit (HOSPITAL_BASED_OUTPATIENT_CLINIC_OR_DEPARTMENT_OTHER): Admitting: Family Medicine

## 2024-10-25 ENCOUNTER — Other Ambulatory Visit (HOSPITAL_COMMUNITY): Payer: Self-pay

## 2024-10-28 ENCOUNTER — Other Ambulatory Visit: Payer: Self-pay

## 2024-10-28 NOTE — Progress Notes (Signed)
 Specialty Pharmacy Refill Coordination Note  Bruce Woods is a 62 y.o. male contacted today regarding refills of specialty medication(s) Bictegravir-Emtricitab-Tenofov (Biktarvy )   Patient requested Delivery   Delivery date: 11/04/24   Verified address: [address], [place],   [address]   Medication will be filled on: 11/01/24

## 2024-11-05 ENCOUNTER — Other Ambulatory Visit (HOSPITAL_BASED_OUTPATIENT_CLINIC_OR_DEPARTMENT_OTHER): Payer: Self-pay

## 2024-11-06 ENCOUNTER — Other Ambulatory Visit (HOSPITAL_BASED_OUTPATIENT_CLINIC_OR_DEPARTMENT_OTHER): Payer: Self-pay

## 2024-11-13 ENCOUNTER — Ambulatory Visit: Admitting: Behavioral Health

## 2024-11-13 ENCOUNTER — Other Ambulatory Visit (HOSPITAL_BASED_OUTPATIENT_CLINIC_OR_DEPARTMENT_OTHER): Payer: Self-pay

## 2024-11-13 ENCOUNTER — Encounter: Payer: Self-pay | Admitting: Behavioral Health

## 2024-11-13 DIAGNOSIS — F4310 Post-traumatic stress disorder, unspecified: Secondary | ICD-10-CM | POA: Diagnosis not present
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
## 2024-11-14 ENCOUNTER — Other Ambulatory Visit: Payer: Self-pay

## 2024-11-14 ENCOUNTER — Other Ambulatory Visit (HOSPITAL_BASED_OUTPATIENT_CLINIC_OR_DEPARTMENT_OTHER): Payer: Self-pay

## 2024-11-22 ENCOUNTER — Other Ambulatory Visit (HOSPITAL_COMMUNITY): Payer: Self-pay

## 2024-11-25 ENCOUNTER — Other Ambulatory Visit (HOSPITAL_BASED_OUTPATIENT_CLINIC_OR_DEPARTMENT_OTHER): Payer: Self-pay

## 2024-11-29 ENCOUNTER — Other Ambulatory Visit: Payer: Self-pay

## 2024-11-29 NOTE — Progress Notes (Signed)
 Specialty Pharmacy Refill Coordination Note  Bruce Woods is a 63 y.o. male contacted today regarding refills of specialty medication(s) Bictegravir-Emtricitab-Tenofov (Biktarvy )   Patient requested Delivery   Delivery date: 12/06/24   Verified address: [address],  [place], [place], [address]   Medication will be filled on: 12/05/24

## 2024-12-03 ENCOUNTER — Other Ambulatory Visit (HOSPITAL_BASED_OUTPATIENT_CLINIC_OR_DEPARTMENT_OTHER): Payer: Self-pay | Admitting: Family Medicine

## 2024-12-03 ENCOUNTER — Other Ambulatory Visit: Payer: Self-pay

## 2024-12-03 DIAGNOSIS — F3342 Major depressive disorder, recurrent, in full remission: Secondary | ICD-10-CM

## 2024-12-04 ENCOUNTER — Other Ambulatory Visit (HOSPITAL_BASED_OUTPATIENT_CLINIC_OR_DEPARTMENT_OTHER): Payer: Self-pay

## 2024-12-04 ENCOUNTER — Other Ambulatory Visit: Payer: Self-pay

## 2024-12-04 MED ORDER — CITALOPRAM HYDROBROMIDE 20 MG PO TABS
20.0000 mg | ORAL_TABLET | Freq: Every day | ORAL | 3 refills | Status: AC
  Filled 2024-12-04: qty 90, 90d supply, fill #0

## 2024-12-04 MED ORDER — ASPIRIN 81 MG PO TBEC
81.0000 mg | DELAYED_RELEASE_TABLET | Freq: Every day | ORAL | 1 refills | Status: AC
  Filled 2024-12-04: qty 90, 90d supply, fill #0

## 2024-12-04 MED ORDER — ZOLPIDEM TARTRATE 5 MG PO TABS
5.0000 mg | ORAL_TABLET | Freq: Every evening | ORAL | 1 refills | Status: AC | PRN
  Filled 2024-12-04: qty 30, 30d supply, fill #0

## 2024-12-05 ENCOUNTER — Other Ambulatory Visit: Payer: Self-pay

## 2025-04-16 ENCOUNTER — Ambulatory Visit (HOSPITAL_BASED_OUTPATIENT_CLINIC_OR_DEPARTMENT_OTHER)

## 2025-04-23 ENCOUNTER — Encounter (HOSPITAL_BASED_OUTPATIENT_CLINIC_OR_DEPARTMENT_OTHER): Admitting: Family Medicine

## 2025-06-04 ENCOUNTER — Other Ambulatory Visit

## 2025-06-18 ENCOUNTER — Ambulatory Visit: Admitting: Infectious Disease
